# Patient Record
Sex: Female | Born: 1945 | Race: White | Hispanic: No | Smoking: Never smoker
Health system: Southern US, Community
[De-identification: ages and names within clinical notes are randomized; demographics above are authoritative.]

## PROBLEM LIST (undated history)

## (undated) DIAGNOSIS — I10 Essential (primary) hypertension: Secondary | ICD-10-CM

## (undated) DIAGNOSIS — K219 Gastro-esophageal reflux disease without esophagitis: Secondary | ICD-10-CM

## (undated) DIAGNOSIS — E785 Hyperlipidemia, unspecified: Secondary | ICD-10-CM

## (undated) DIAGNOSIS — F419 Anxiety disorder, unspecified: Secondary | ICD-10-CM

## (undated) HISTORY — DX: Essential (primary) hypertension: I10

## (undated) HISTORY — DX: Hyperlipidemia, unspecified: E78.5

## (undated) HISTORY — PX: CHOLECYSTECTOMY: SHX55

## (undated) HISTORY — DX: Gastro-esophageal reflux disease without esophagitis: K21.9

## (undated) HISTORY — DX: Anxiety disorder, unspecified: F41.9

---

## 2001-12-22 ENCOUNTER — Inpatient Hospital Stay (HOSPITAL_COMMUNITY): Admission: EM | Admit: 2001-12-22 | Discharge: 2001-12-31 | Payer: Self-pay | Admitting: Psychiatry

## 2004-11-29 ENCOUNTER — Ambulatory Visit: Payer: Self-pay | Admitting: Emergency Medicine

## 2004-11-29 ENCOUNTER — Emergency Department: Payer: Self-pay | Admitting: Unknown Physician Specialty

## 2004-11-29 IMAGING — US ABDOMEN ULTRASOUND
1 series · 17 of 25 positions shown · non-contrast
Comparison: none

REASON FOR EXAM: abd pain
COMMENTS:

[Series 1: abdomen ultrasound · 17 of 61 slices shown]
[im 1/61]
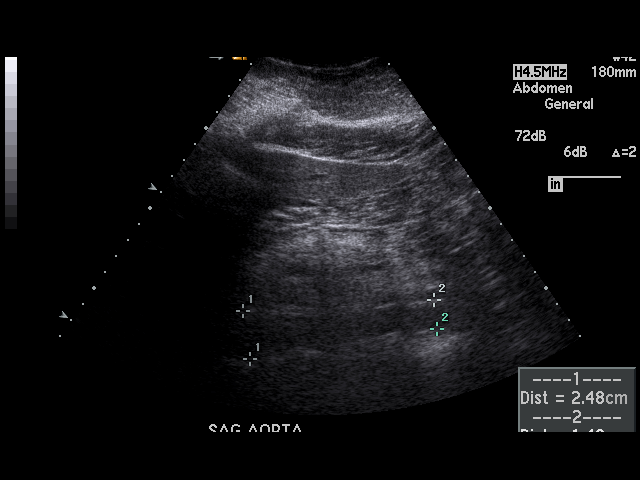
[im 6/61]
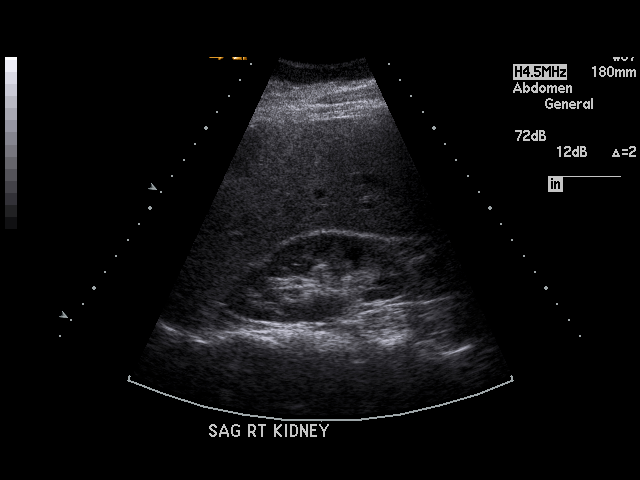
[im 8/61]
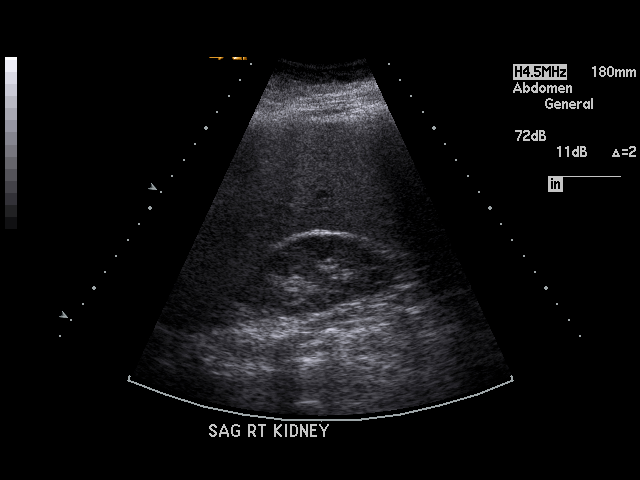
[im 13/61]
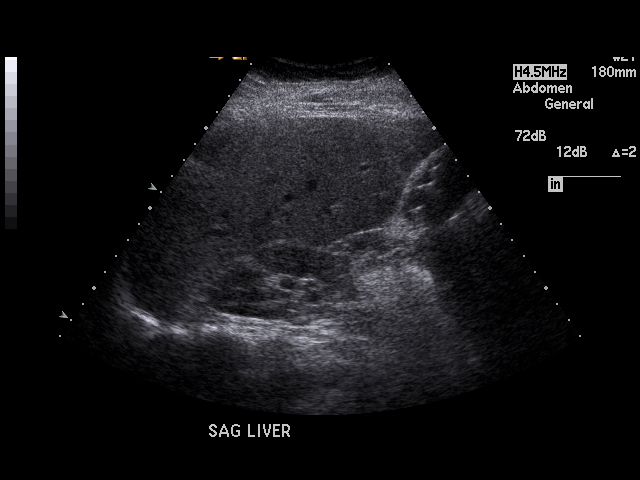
[im 16/61]
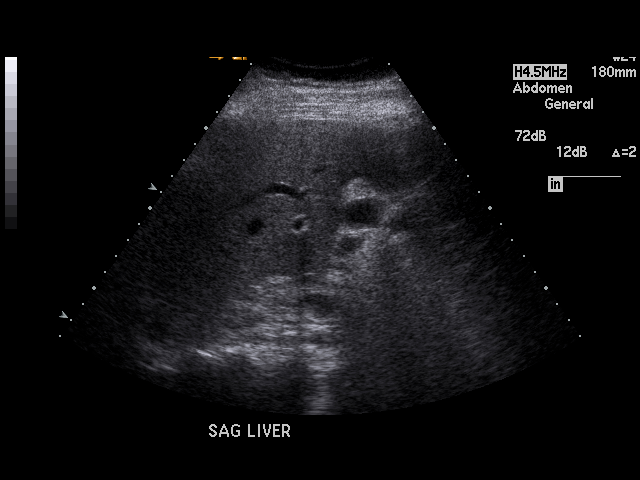
[im 21/61]
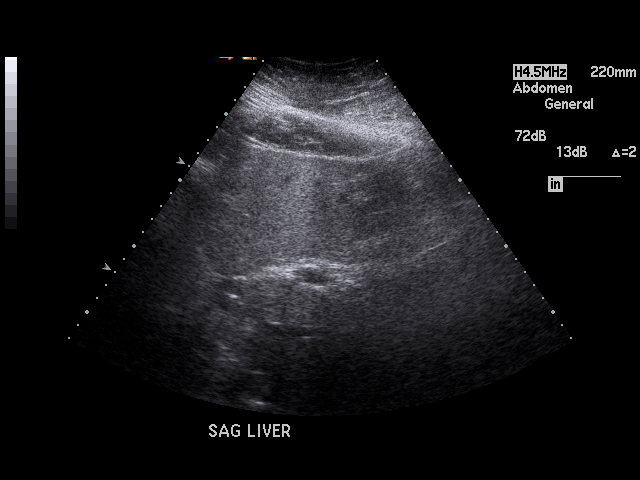
[im 23/61]
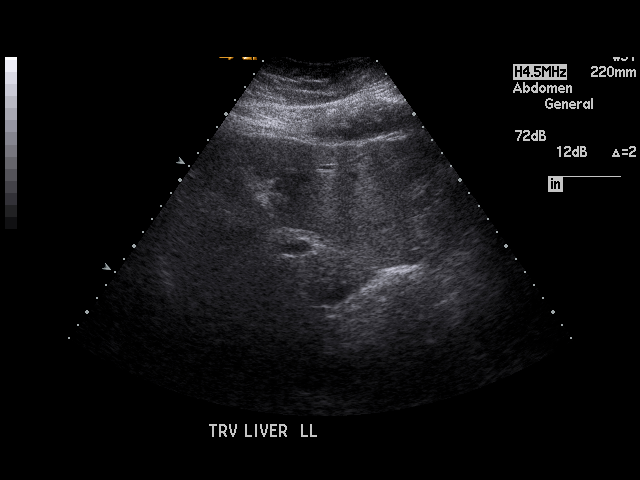
[im 28/61]
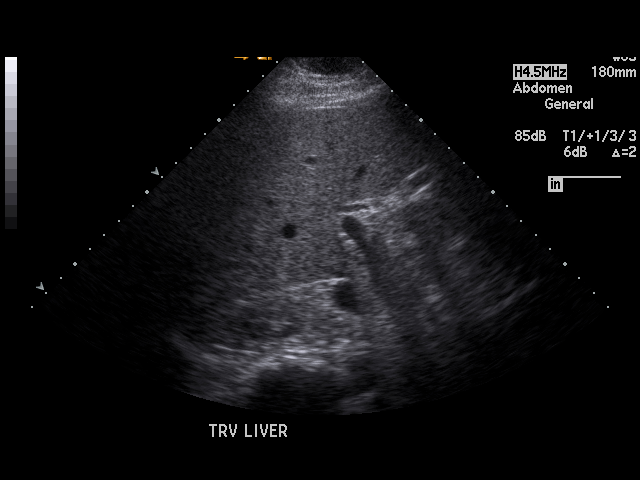
[im 31/61]
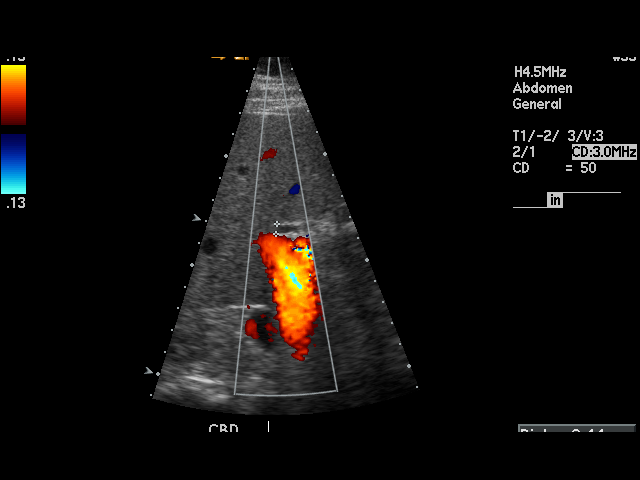
[im 33/61]
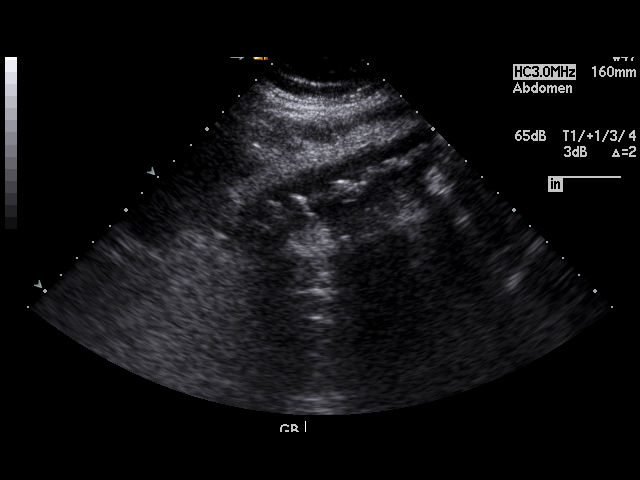
[im 38/61]
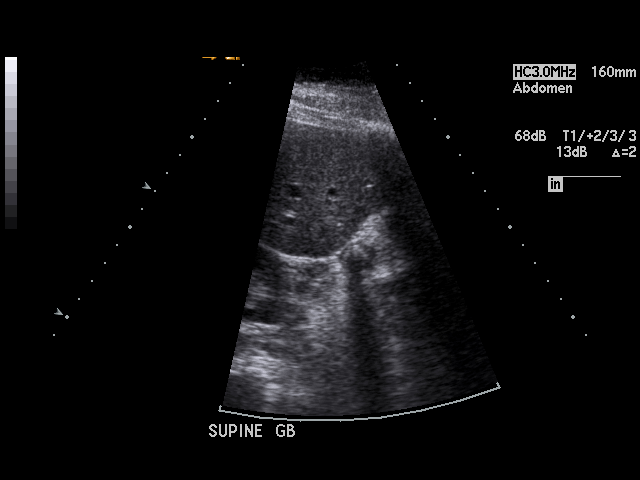
[im 41/61]
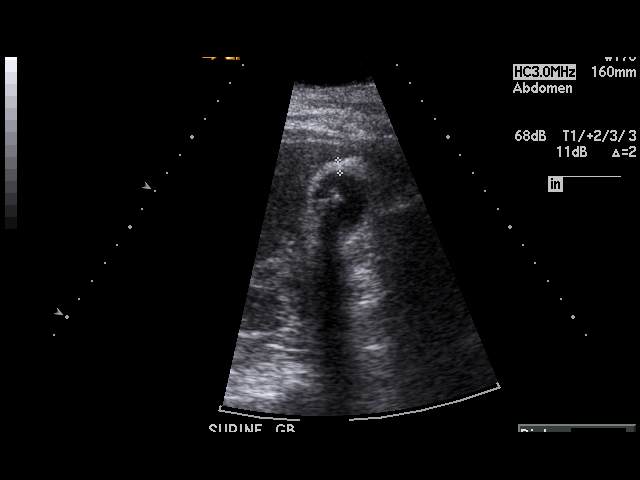
[im 46/61]
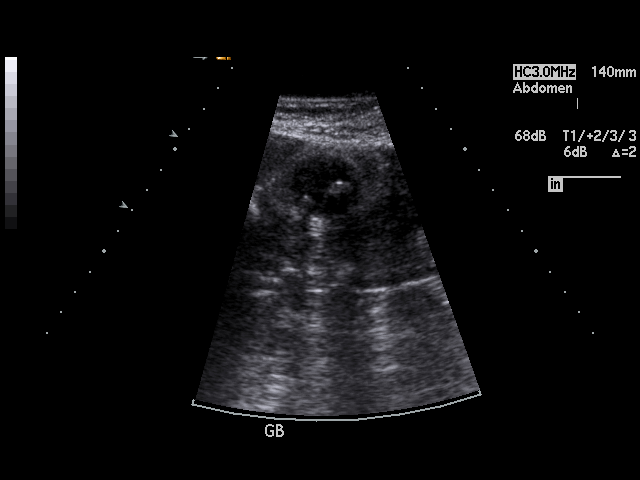
[im 48/61]
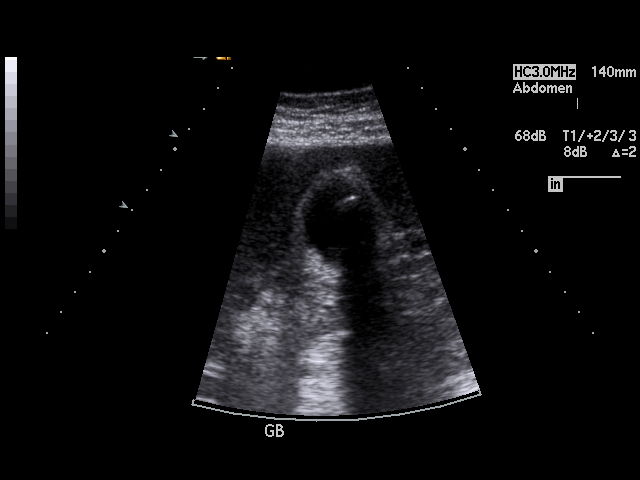
[im 53/61]
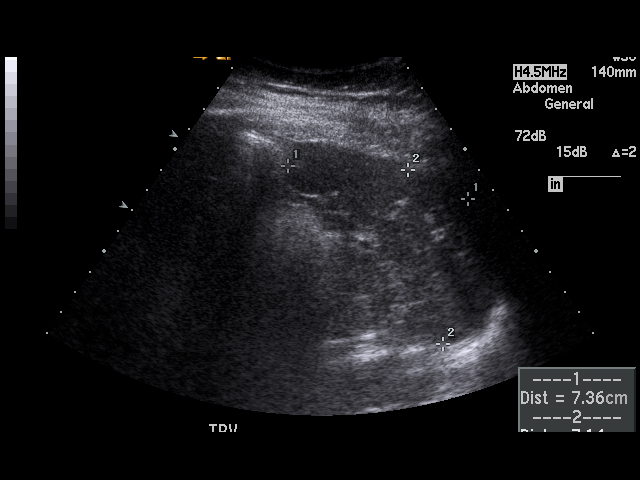
[im 56/61]
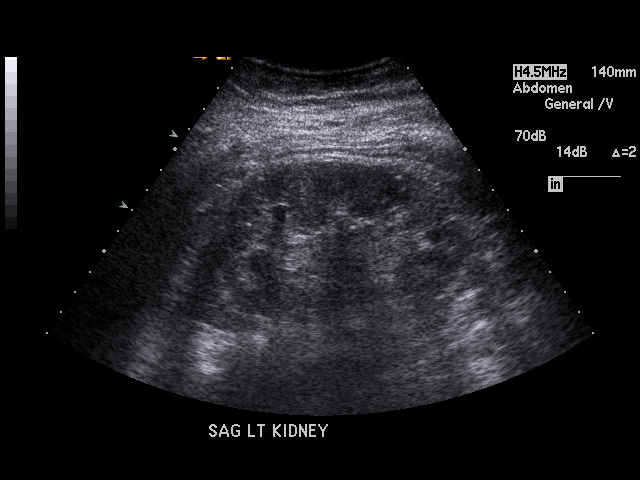
[im 61/61]
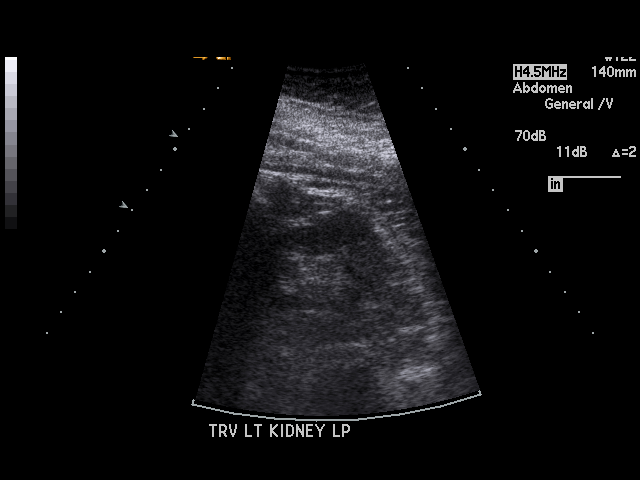

[17 of 25 positions shown; findings below may reference images not displayed]

PROCEDURE:     US  - US ABDOMEN GENERAL SURVEY  - [DATE]  [DATE]

RESULT:     Real time imaging was obtained. The gallbladder is identified
containing multiple echogenic densities with shadowing consistent with
multiple gallstones.  The common duct is within normal limits measuring
mm.  The gallbladder wall is thickened measuring 6 mm.

The liver and spleen appear within normal limits with appropriate
echotexture.

No hydronephrosis of either kidney is noted.  No abnormalities of the
abdominal aorta are noted in the region visualized.
IMPRESSION: 1)Findings compatible with multiple gallstones and a thickened gallbladder
wall.

## 2004-11-29 IMAGING — CT CT ABD-PELV W/O CM
1 of 2 series · 14 of 32 positions shown, 18 images · non-contrast
Comparison: none

REASON FOR EXAM: (1) abd pain  rm2; (2) abd pain  rm2
COMMENTS:

[Series 2: stone · axial · 0.72mm/px · z∈[-447,-42]mm · 14 of 155 slices shown, 18 images]
[im 13/155  soft-tissue]
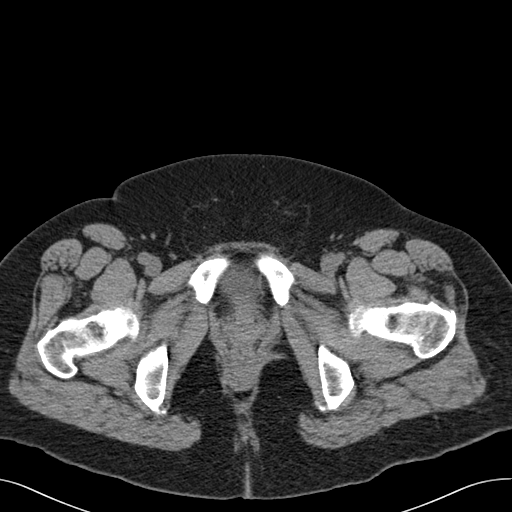
[im 13/155  bone]
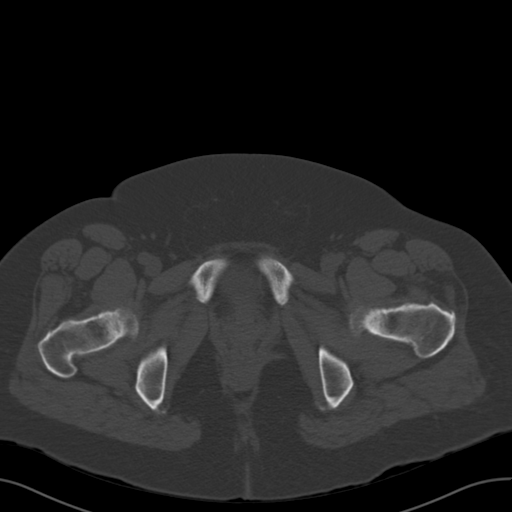
[im 25/155  soft-tissue]
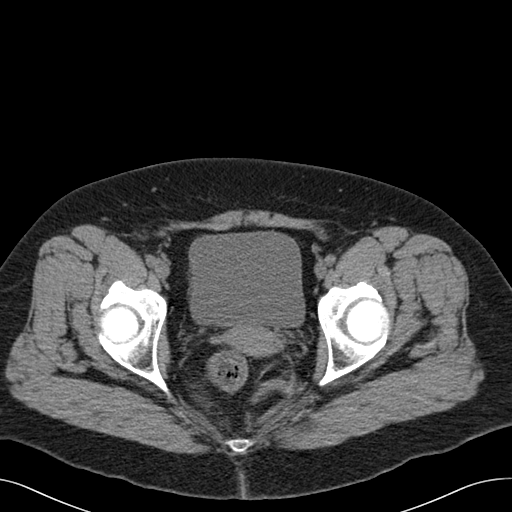
[im 37/155  soft-tissue]
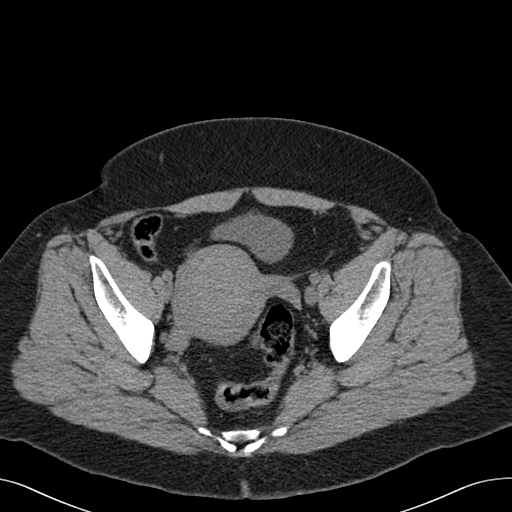
[im 50/155  soft-tissue]
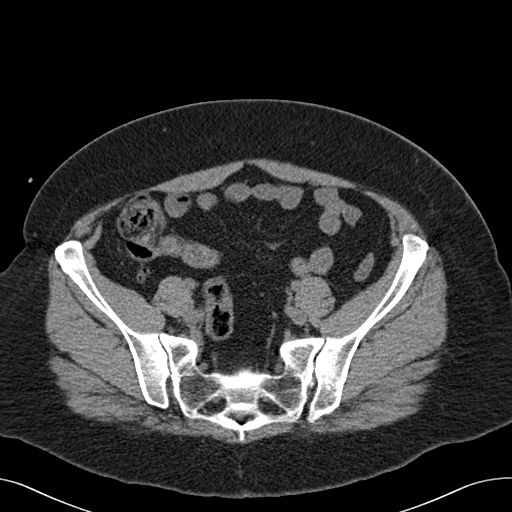
[im 62/155  soft-tissue]
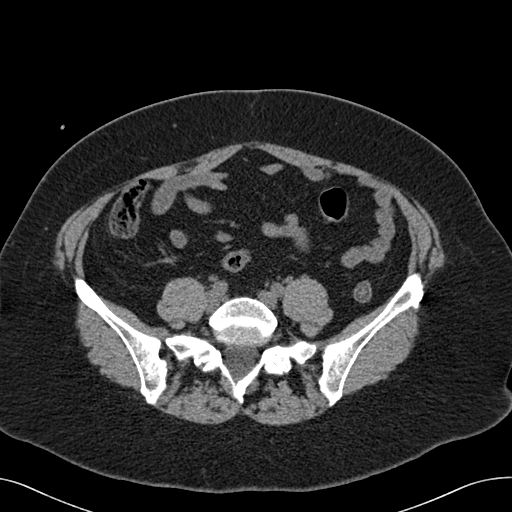
[im 74/155  soft-tissue]
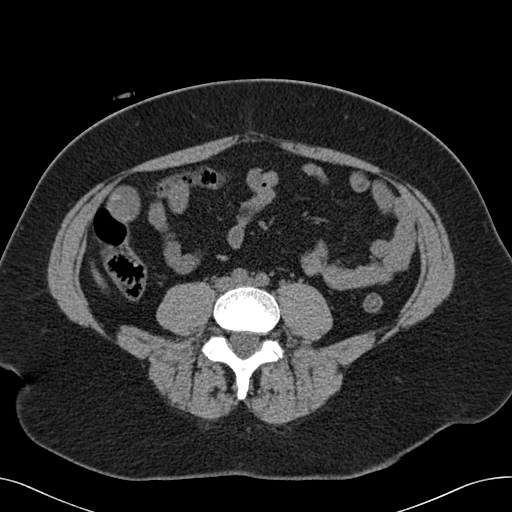
[im 87/155  soft-tissue]
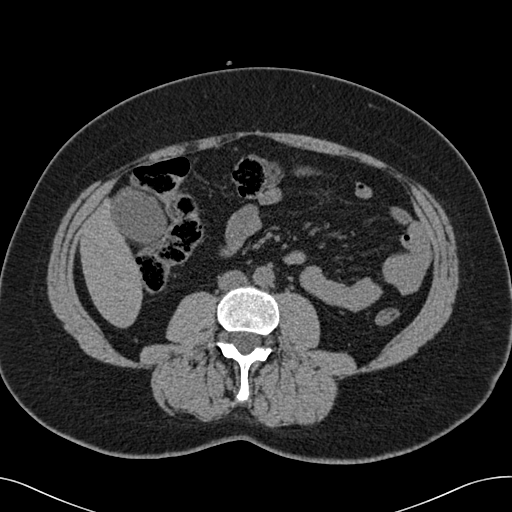
[im 99/155  soft-tissue]
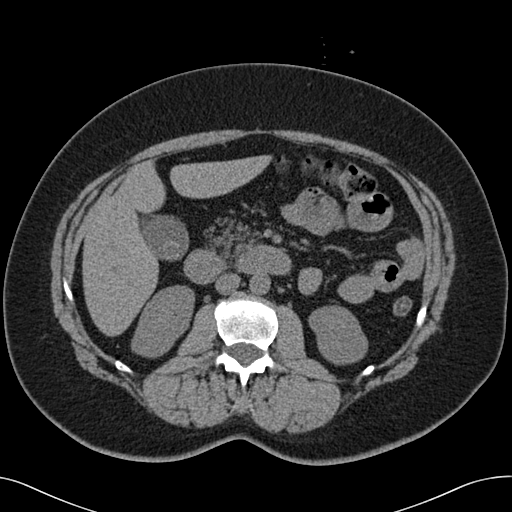
[im 111/155  soft-tissue]
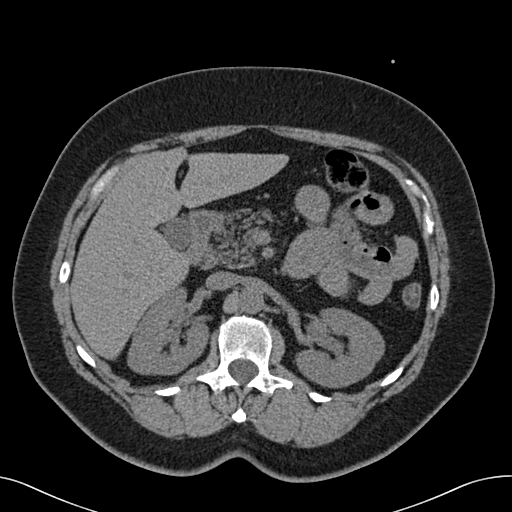
[im 111/155  bone]
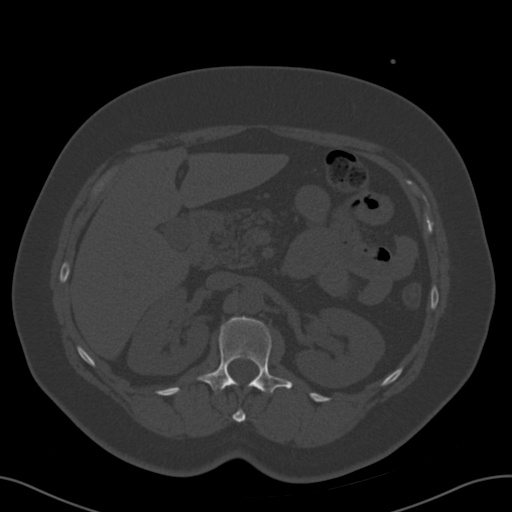
[im 124/155  soft-tissue]
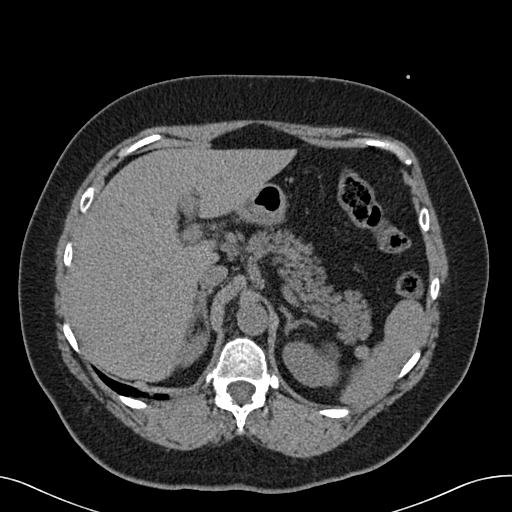
[im 130/155  lung]
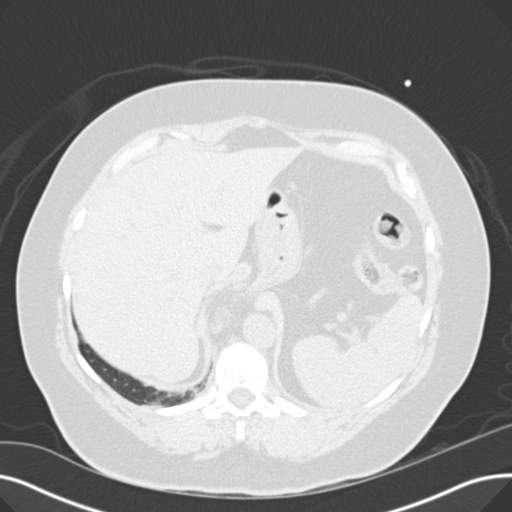
[im 136/155  soft-tissue]
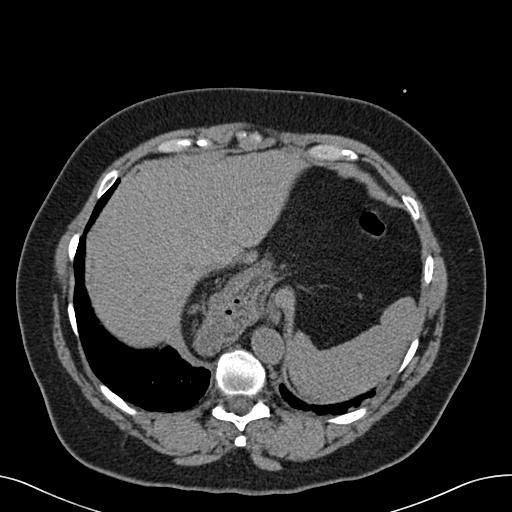
[im 136/155  lung]
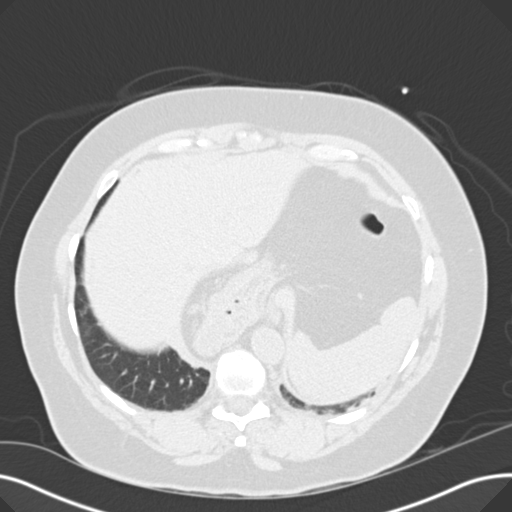
[im 142/155  lung]
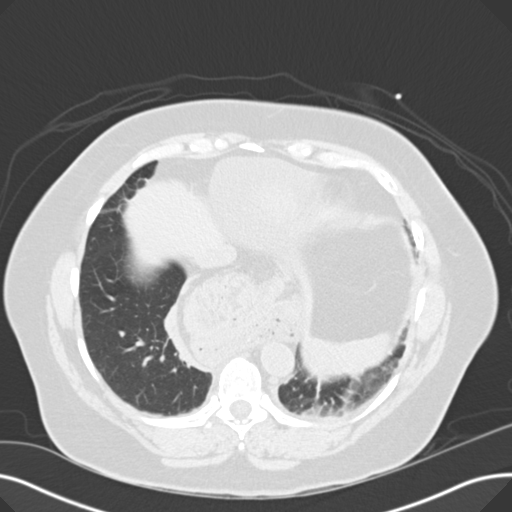
[im 148/155  soft-tissue]
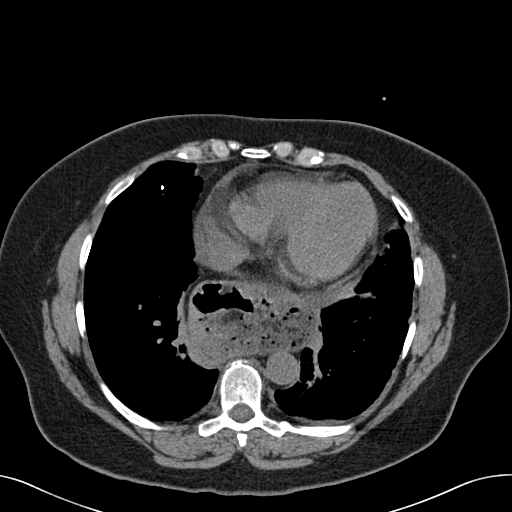
[im 148/155  lung]
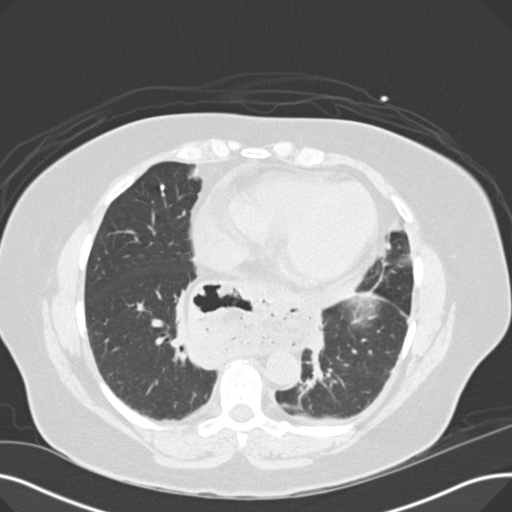

[14 of 32 positions shown; findings below may reference images not displayed]

PROCEDURE:     CT  - CT ABDOMEN AND PELVIS W[DATE]  [DATE]

RESULT:     The study was performed to evaluate the patient for urinary
tract stones. The patient is complaining of nausea and RIGHT mid and lower
abdominal discomfort.

There is noted to be a small pericardial effusion. There is a large hiatal
hernia-partially intrathoracic stomach.  The liver exhibits normal density
for this non-contrast study. There are gallstones present within the
moderately distended gallbladder.  I cannot exclude some gallbladder wall
thickening. No definite pericholecystic fluid is seen, but there is poor
definition of the pericholecystic fat immediately adjacent to the
gallbladder.  The pancreas exhibits fatty infiltration, but no evidence of a
mass or ductal dilation.  The spleen, stomach, and adrenal glands are normal
in appearance. Not mentioned above is a hypodensity measuring 1.5 cm in
diameter in the LEFT lobe of the liver with Hounsfield measurement of
approximately 5 compatible with a benign cyst.

The kidneys exhibit normal contour.  I see no parenchymal calcifications nor
evidence of hydronephrosis.  The perinephric fat is normal in appearance.
The periaortic and pericaval regions are normal in appearance. The
unopacified loops of small and large bowel are normal.

Within the pelvis the uterus and adnexal structures are grossly normal for
the non-contrast study.  The urinary bladder is partially distended and
grossly normal. The unopacified loops of small and large bowel are normal in
appearance. At lung window settings there is minimal basilar atelectasis on
the LEFT.
IMPRESSION: 1)I do not see evidence of urinary tract stones or obstruction.

2)The gallbladder is adequately distended, but contains at least one stone
and there is subtle wall thickening and perhaps a small amount of
pericholecystic fluid. This may reflect an element of acute cholecystitis.
Follow-up ultrasound may be of value. Surgical consultation is recommended.

3)There is a probable simple cyst in the LEFT lobe of the liver.

4)I see no acute abnormality elsewhere within the abdomen. There is a large
hiatal hernia-partially intrathoracic stomach and there is a small
pericardial effusion.

A preliminary report was faxed to the Emergency Department by Dr. ADAM
ADAM of the [HOSPITAL] at the conclusion of the study.

## 2004-12-05 ENCOUNTER — Other Ambulatory Visit: Payer: Self-pay

## 2004-12-07 ENCOUNTER — Ambulatory Visit: Payer: Self-pay | Admitting: Vascular Surgery

## 2005-12-06 ENCOUNTER — Ambulatory Visit: Payer: Self-pay | Admitting: Family Medicine

## 2005-12-07 ENCOUNTER — Ambulatory Visit: Payer: Self-pay | Admitting: Internal Medicine

## 2006-01-01 ENCOUNTER — Ambulatory Visit: Payer: Self-pay | Admitting: General Surgery

## 2006-01-01 HISTORY — PX: COLONOSCOPY: SHX174

## 2006-09-25 ENCOUNTER — Ambulatory Visit: Payer: Self-pay | Admitting: Family Medicine

## 2006-09-25 IMAGING — US US PELV - US TRANSVAGINAL
1 series · 17 of 25 positions shown · non-contrast
Comparison: none

REASON FOR EXAM: Pelvic pain
COMMENTS:

[Series 1: us pelv - us transvaginal · 17 of 61 slices shown]
[im 1/61]
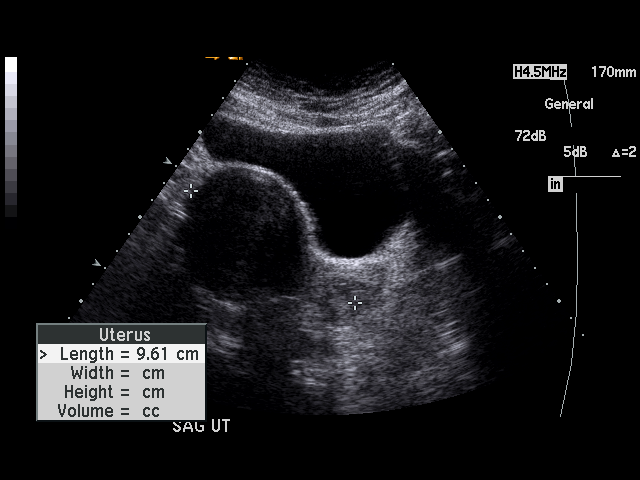
[im 6/61]
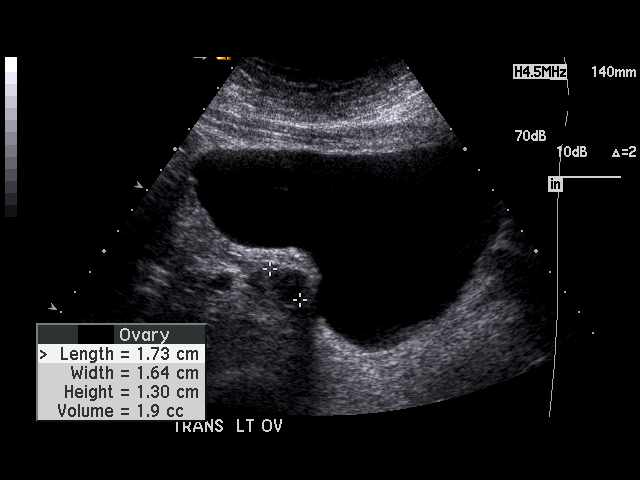
[im 8/61]
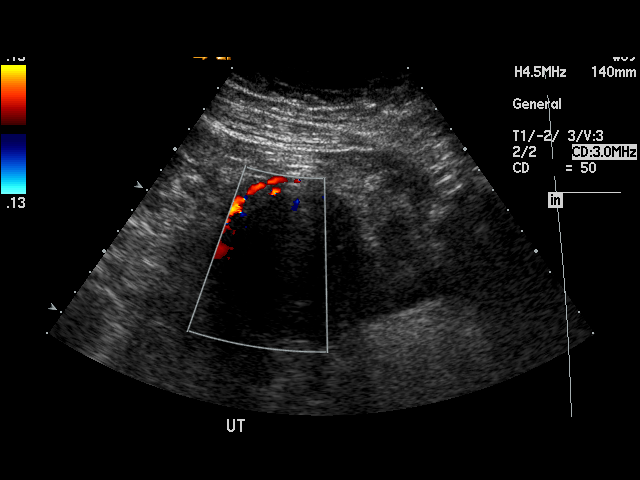
[im 13/61]
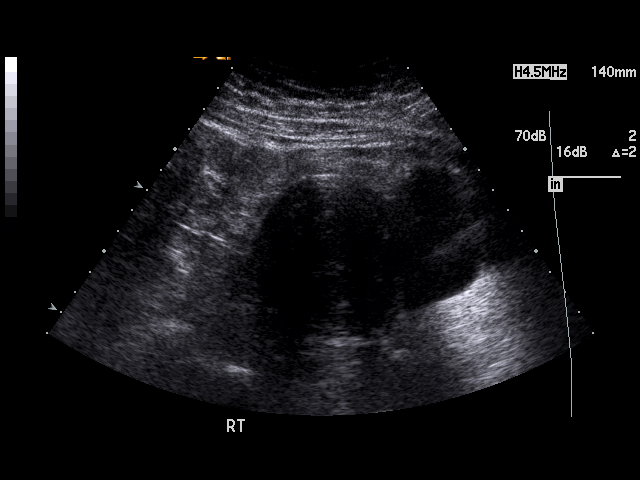
[im 16/61]
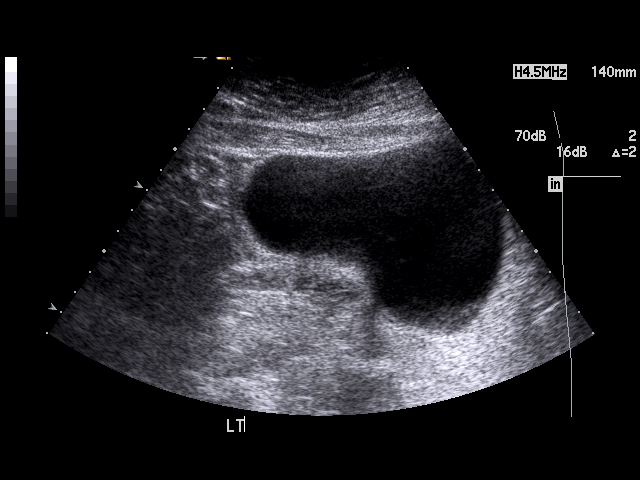
[im 21/61]
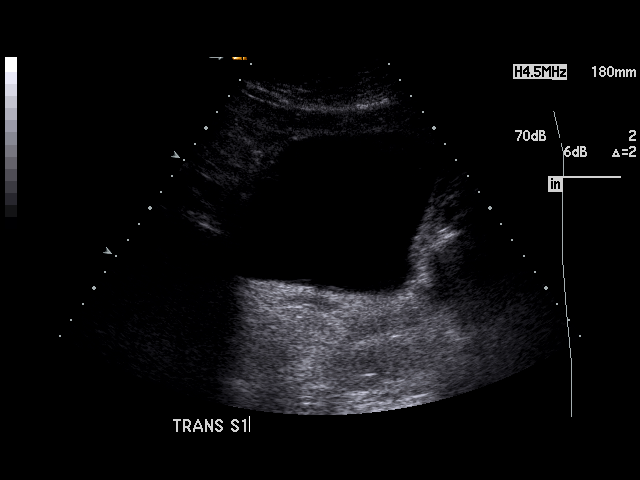
[im 23/61]
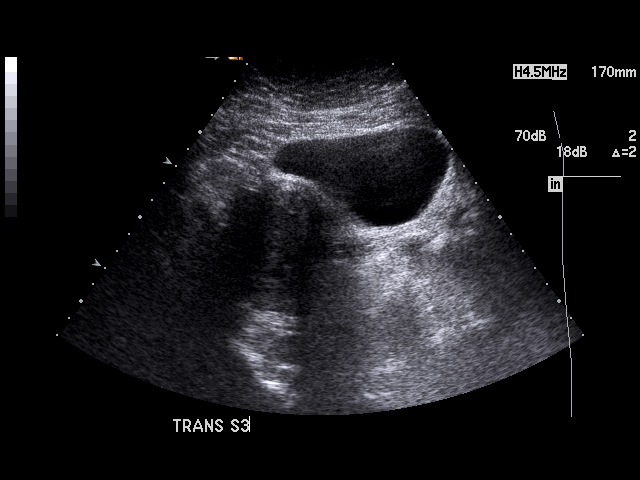
[im 28/61]
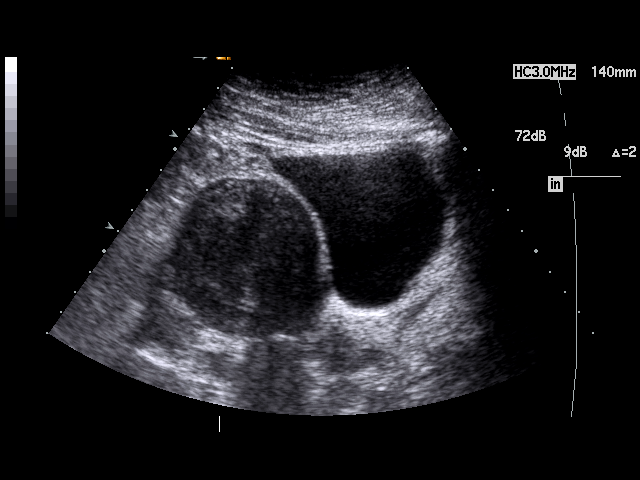
[im 31/61]
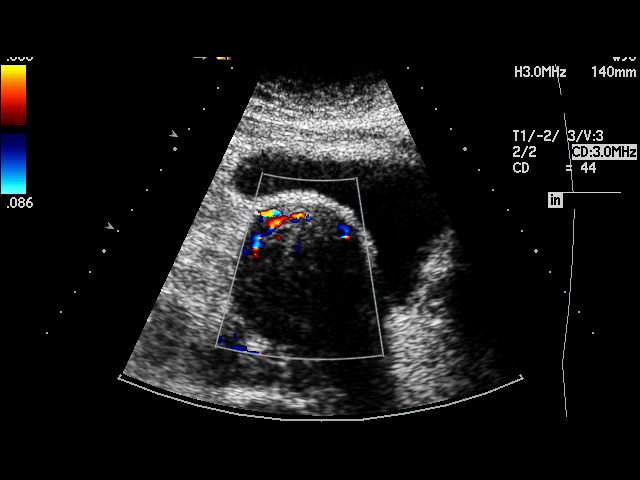
[im 33/61]
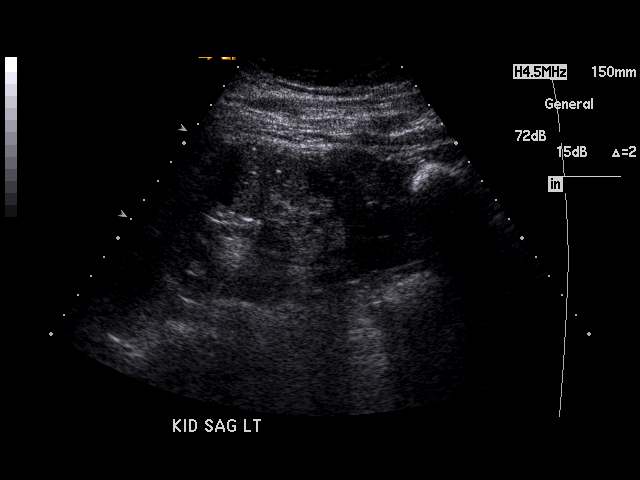
[im 38/61]
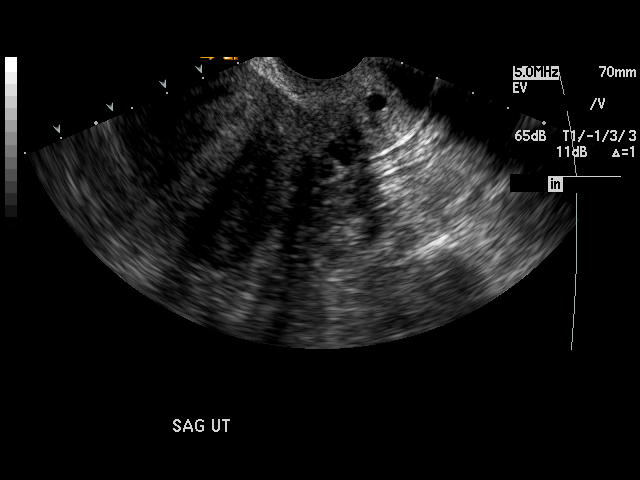
[im 41/61]
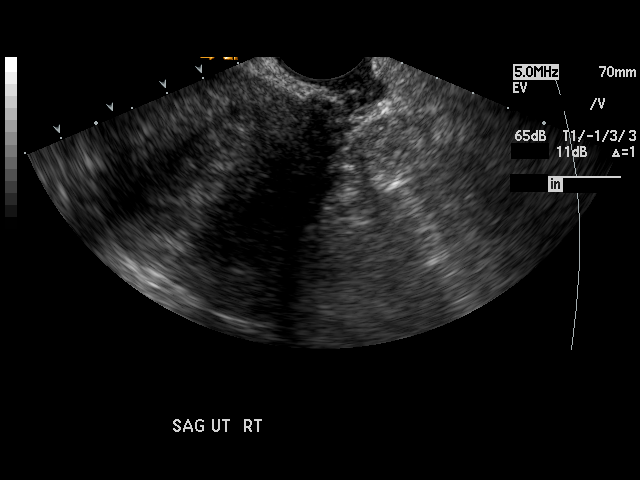
[im 46/61]
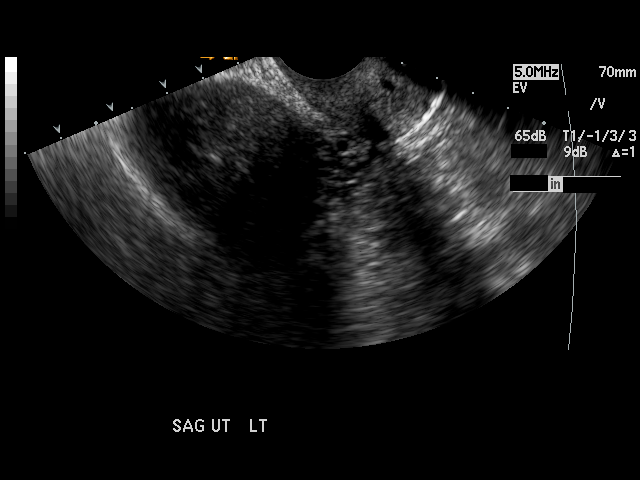
[im 48/61]
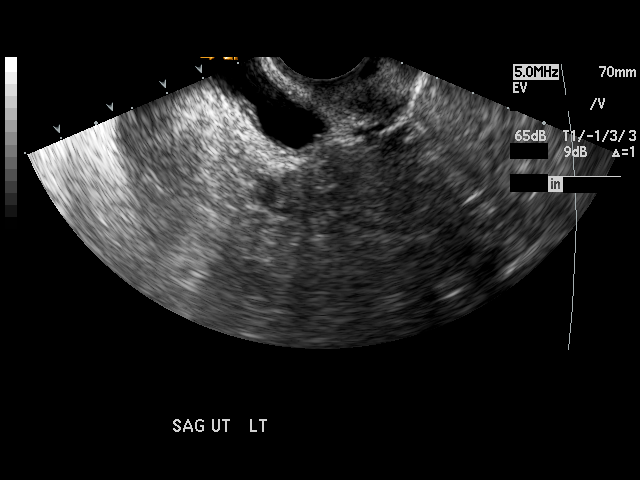
[im 53/61]
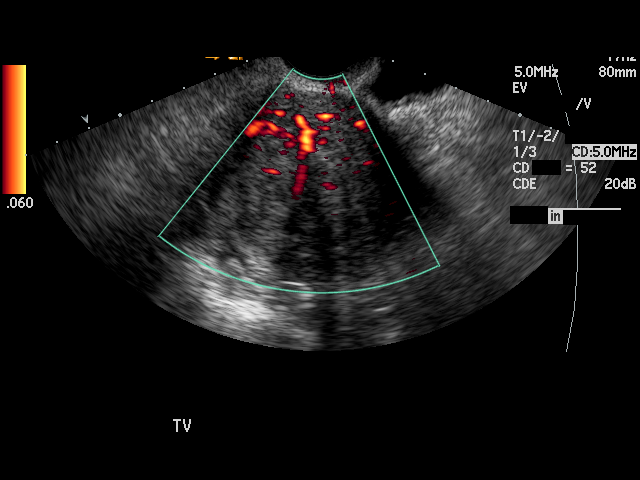
[im 56/61]
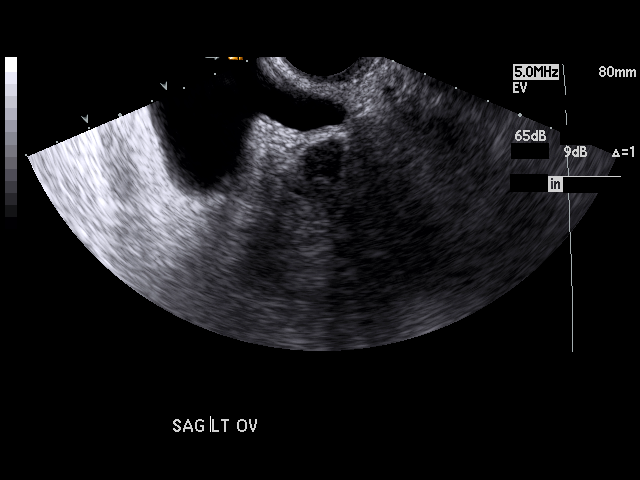
[im 61/61]
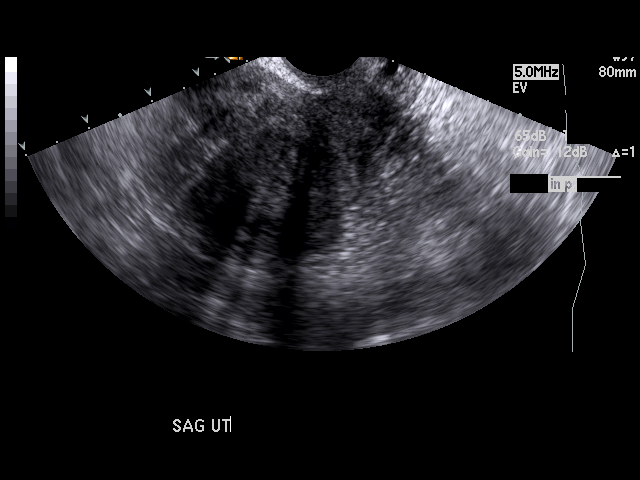

[17 of 25 positions shown; findings below may reference images not displayed]

PROCEDURE:     US  - US PELVIS MASS EXAM  - [DATE] [DATE] [DATE]  [DATE]

RESULT:     The patient is complaining of pelvic discomfort.

Comparison is made to a noncontrast CT scan of [DATE].

In the uterine fundus, there is an isoechoic, rounded mass demonstrated. It
measures approximately 3.9 cm in diameter. The endometrial stripe could not
be determined. The overall dimensions of the uterus are 9.6 x 6.0 x 6.3 cm.
No abnormal endometrial fluid collections are seen. The LEFT ovary is normal
in echotexture and measures approximately 1.7 cm in greatest dimension. The
RIGHT ovary was not demonstrated. There is no free fluid in the cul-de-sac.
Survey views of the kidneys are normal. Review of a CT scan did reveal an
area of nearly isodensity in the uterine fundus that likely reflects the
findings seen today.
IMPRESSION: 1. There are findings which likely reflect a uterine fibroid measuring
approximately 3.9 cm in diameter. This lies in the uterine fundus.
2.  I do not see objective evidence of abnormality elsewhere involving the
uterus or of the LEFT ovary. The RIGHT ovary is not demonstrated.

## 2006-12-11 ENCOUNTER — Ambulatory Visit: Payer: Self-pay | Admitting: Family Medicine

## 2009-07-08 ENCOUNTER — Ambulatory Visit: Payer: Self-pay | Admitting: Family Medicine

## 2009-08-02 ENCOUNTER — Ambulatory Visit: Payer: Self-pay | Admitting: Otolaryngology

## 2009-08-02 IMAGING — US ULTRASOUND CORE BIOPSY
1 series · 8 of 8 positions shown · non-contrast
Comparison: none

REASON FOR EXAM: rt thyroid nodule
COMMENTS:

PROCEDURE:     US  - US GUIDED BX/ASPIRATION NOT BR  - [DATE]  [DATE]
RESULT:     Thyroid Biopsy
INDICATION: Right thyroid nodule

[Series 1: ultrasound core biopsy · 8 of 8 slices shown]
[im 1/8]
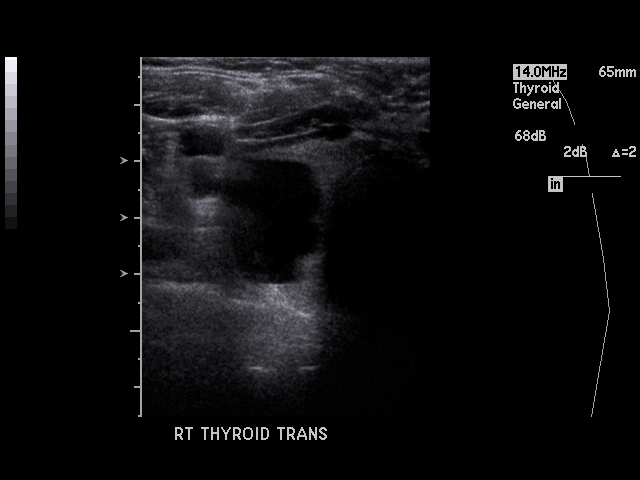
[im 2/8]
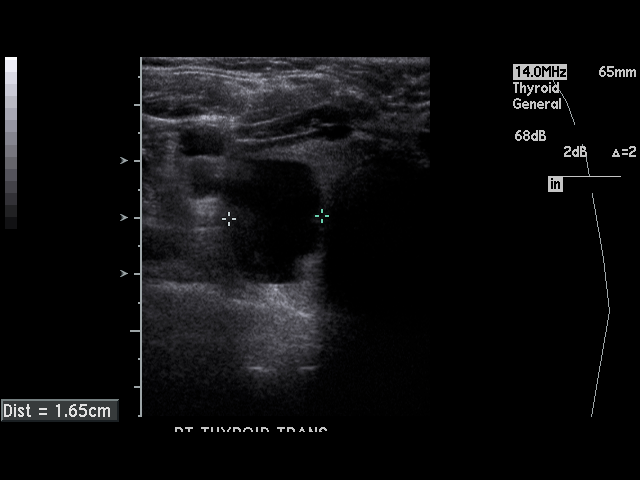
[im 3/8]
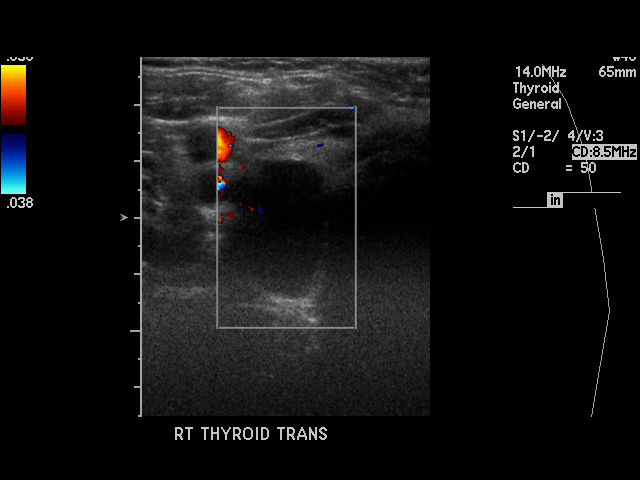
[im 4/8]
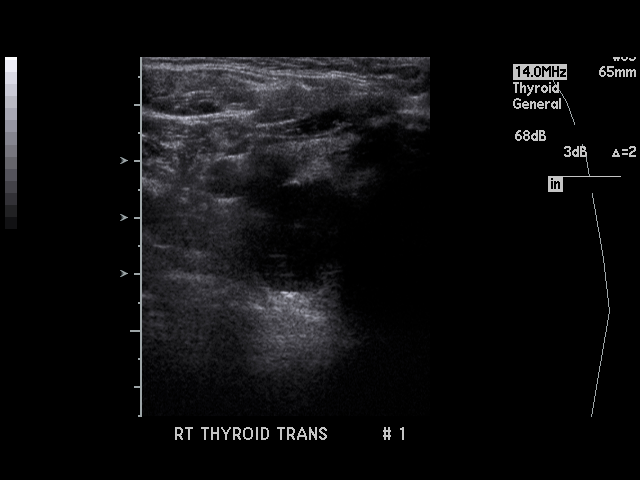
[im 5/8]
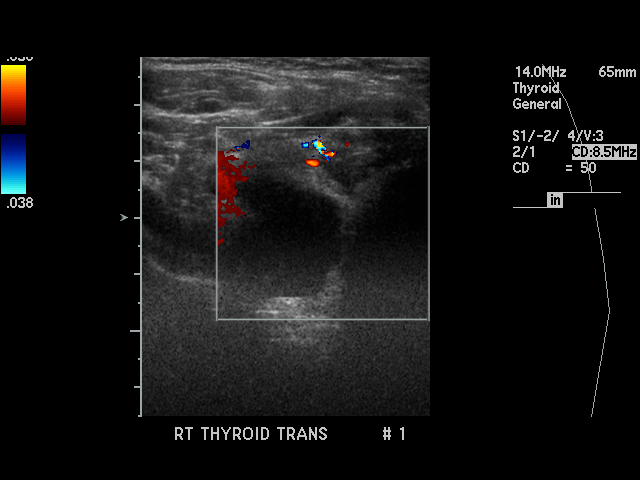
[im 6/8]
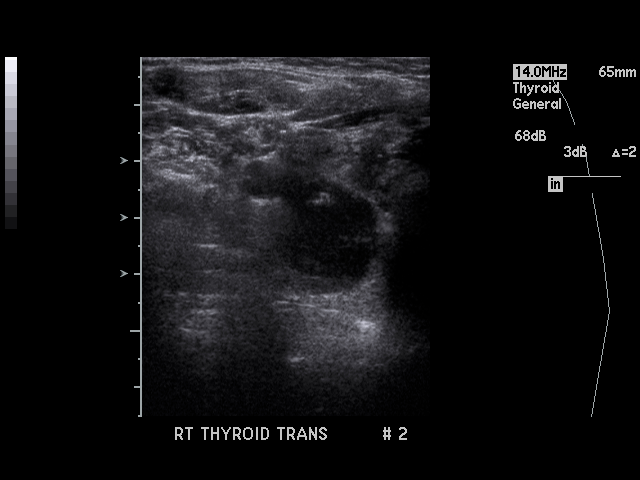
[im 7/8]
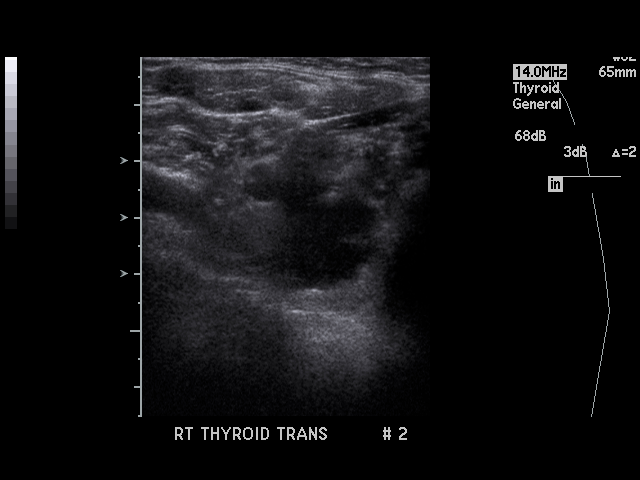
[im 8/8]
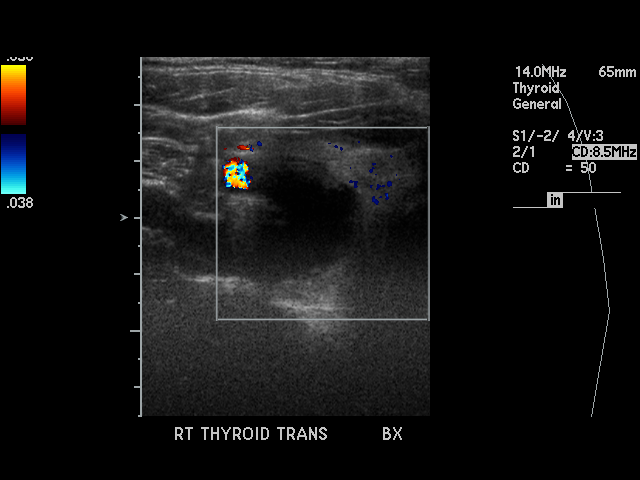

[8 of 8 positions shown; findings below may reference images not displayed]

Comparisons:  [DATE]

Procedure: Clinical assessment was performed and informed consent obtained.
Grayscale and color-flow images of the thyroid gland were obtained to
localize the nodule for biopsy. The neck was prepped and draped in the usual
sterile manner and the overlying skin was anesthetized with 1% lidocaine.

Under ultrasound guidance a 25 gauge heparinized needle was inserted into
the hypoechoic nodule. A 3 ml syringe was attached to the needle hub and a
fine needle aspirate sample obtained. One more samples were obtained using
similar technique. Cytopathology was present during the procedure and
verified the samples. The FNA yielded a rust colored viscous material. The
needle was removed, the neck cleaned, and a sterile bandage applied.

The procedure was well tolerated and without complication.  The patient was
transferred to the recovery unit in stable condition.
IMPRESSION: Ultrasound-guided fine needle aspirate of a right lobe cystic thyroid nodule.

## 2009-08-22 ENCOUNTER — Ambulatory Visit: Payer: Self-pay | Admitting: Family Medicine

## 2010-08-07 ENCOUNTER — Ambulatory Visit: Payer: Self-pay | Admitting: Otolaryngology

## 2010-08-07 IMAGING — US US THYROID
1 series · 17 of 25 positions shown · non-contrast
Comparison: none

REASON FOR EXAM: right thyroid cyst follow up
COMMENTS:

[Series 1: us thyroid · 17 of 36 slices shown]
[im 1/36]
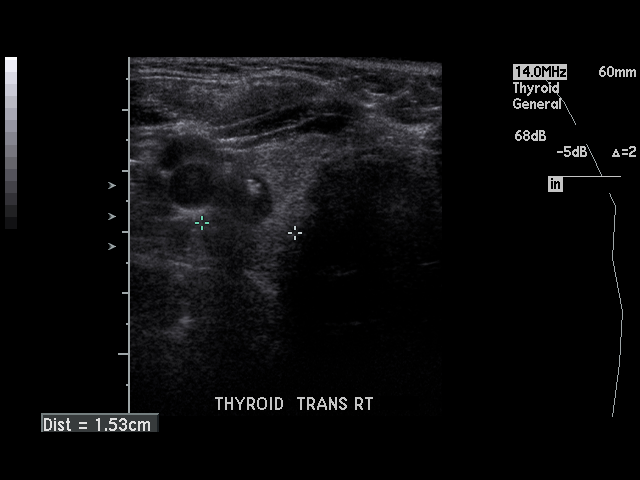
[im 3/36]
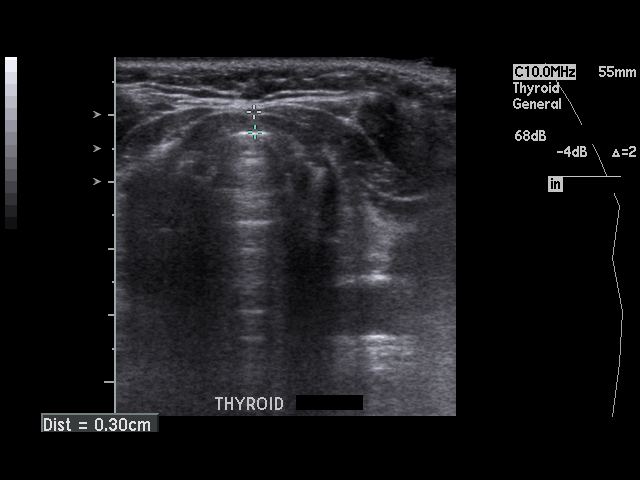
[im 5/36]
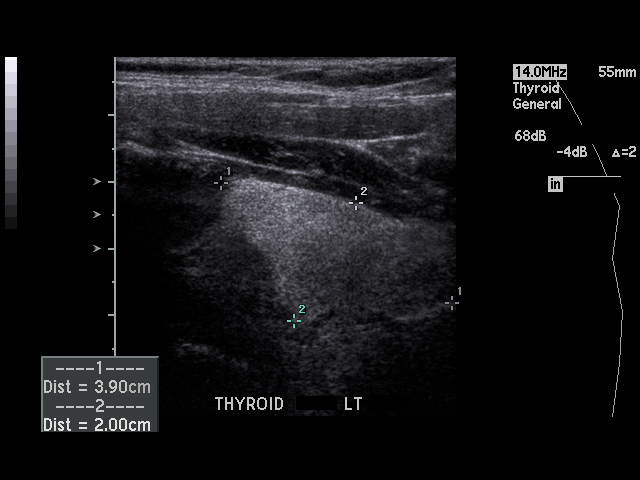
[im 8/36]
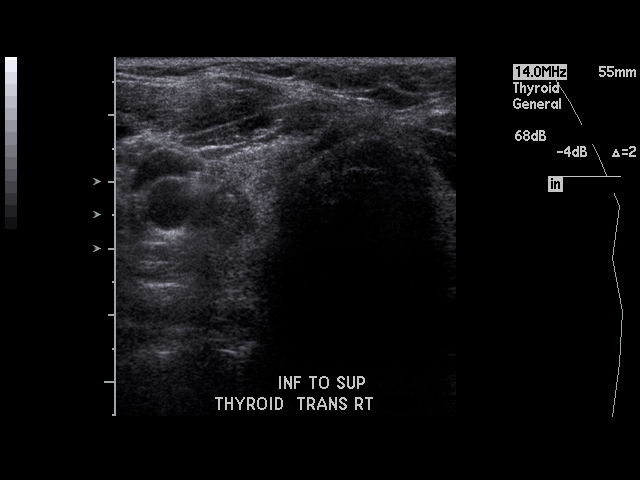
[im 9/36]
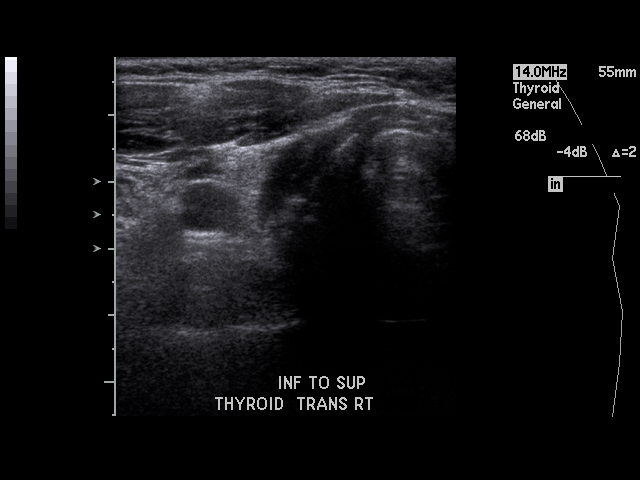
[im 12/36]
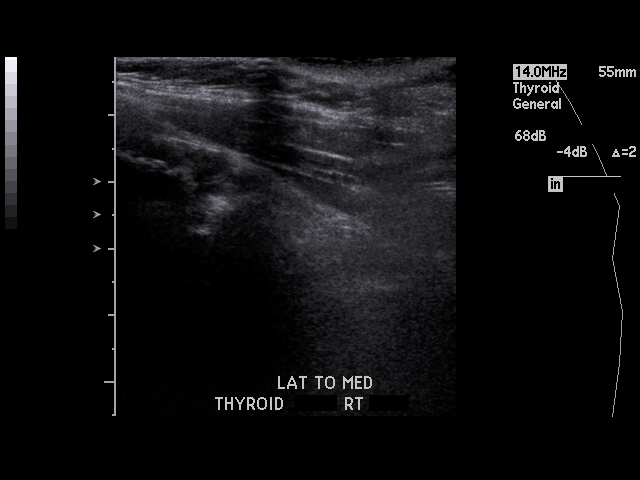
[im 14/36]
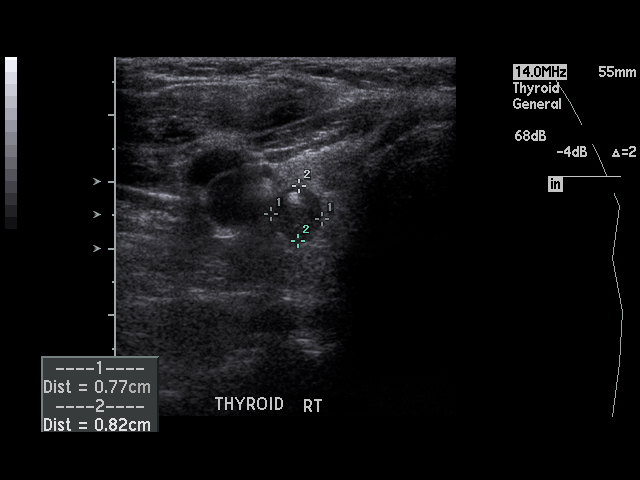
[im 17/36]
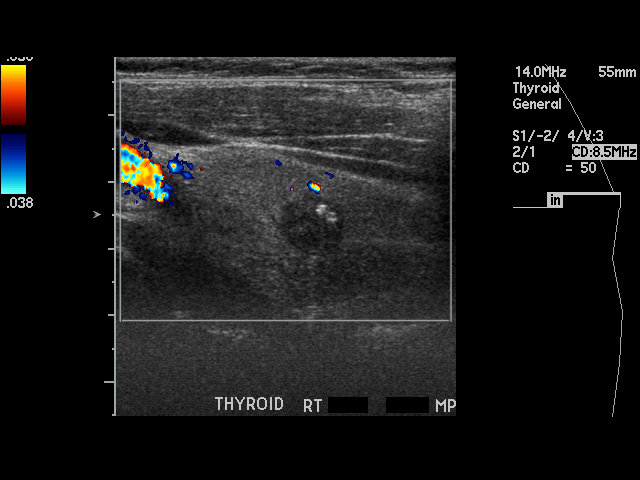
[im 18/36]
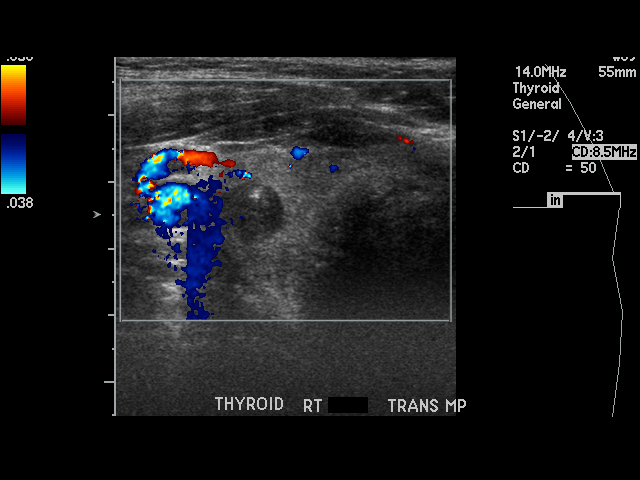
[im 19/36]
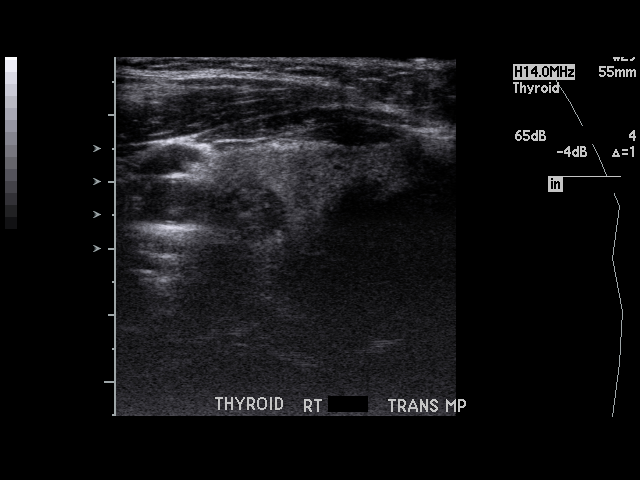
[im 22/36]
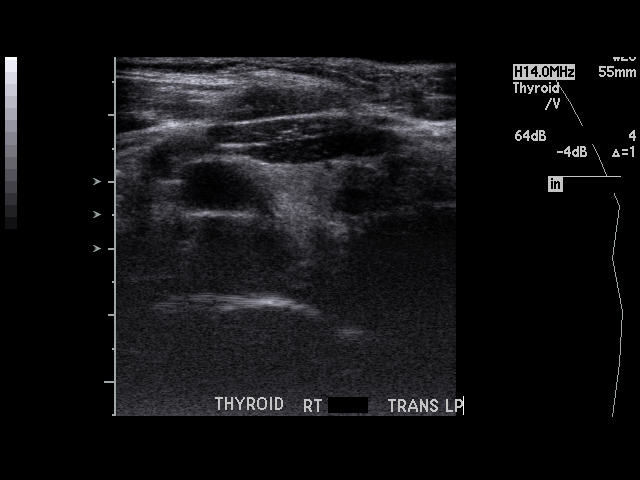
[im 24/36]
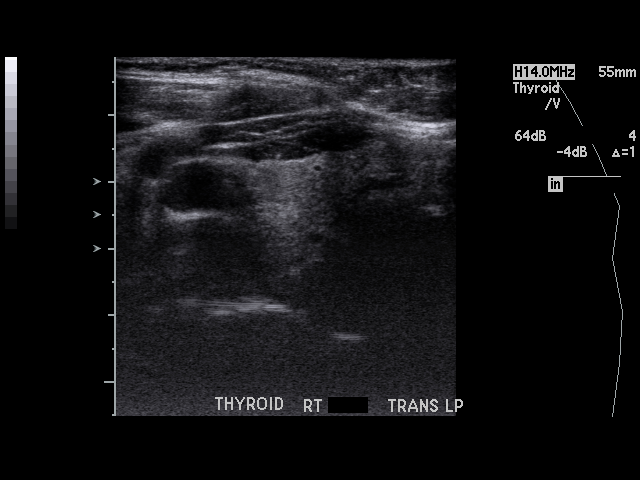
[im 27/36]
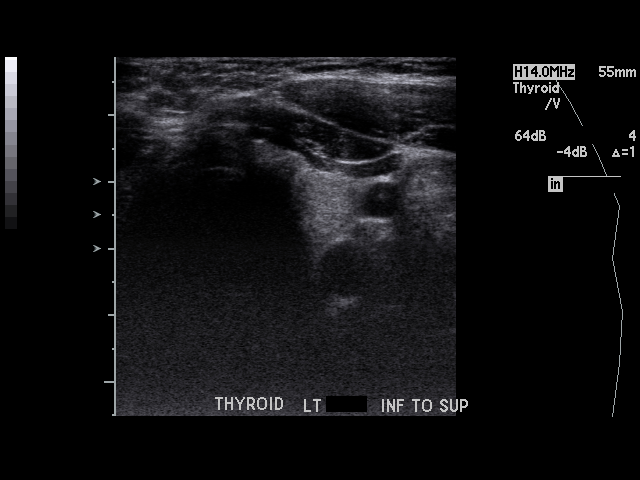
[im 28/36]
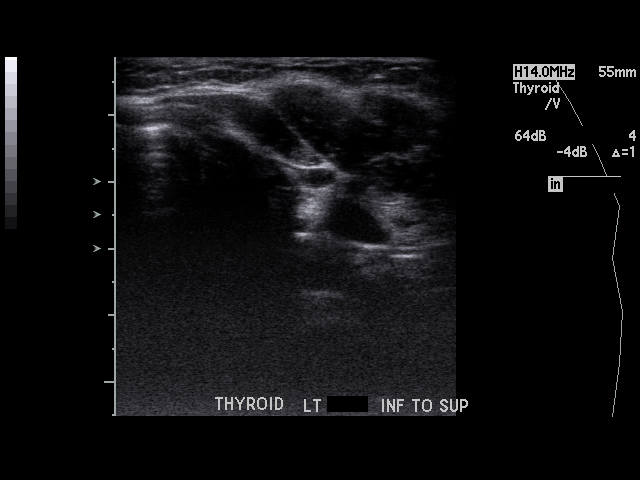
[im 31/36]
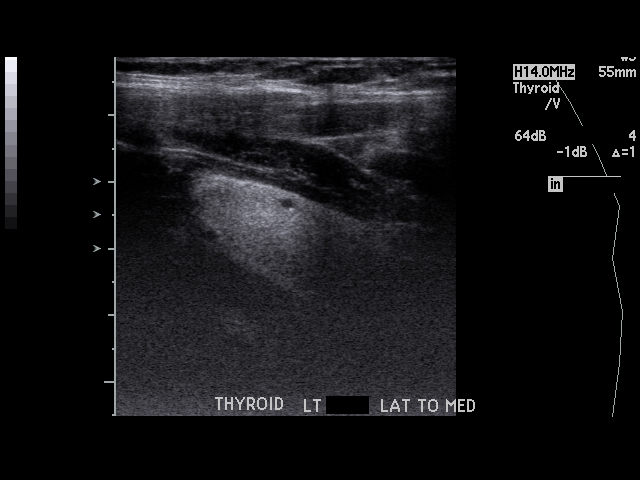
[im 33/36]
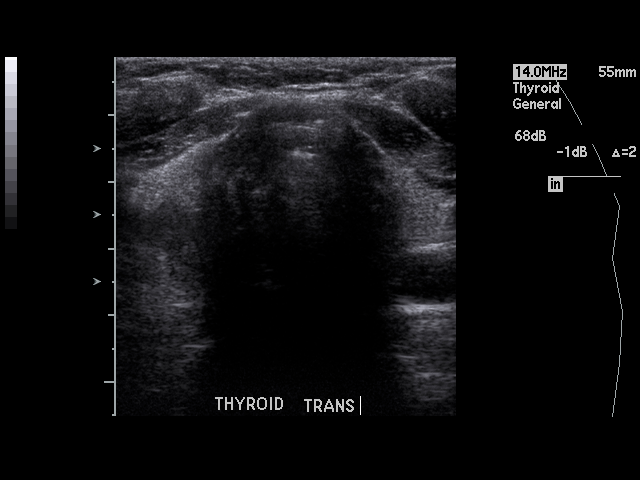
[im 36/36]
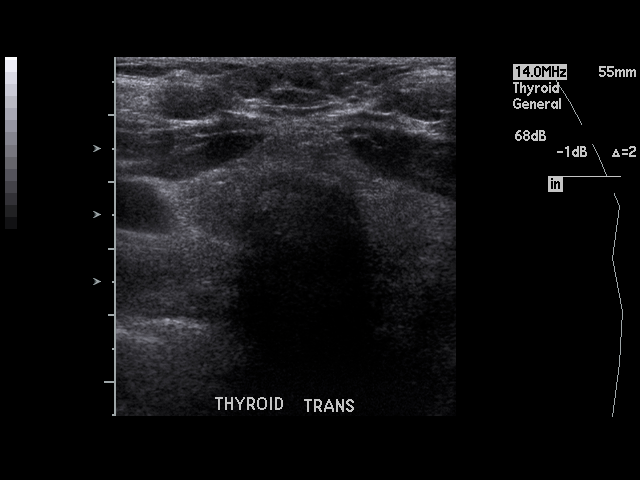

[17 of 25 positions shown; findings below may reference images not displayed]

PROCEDURE:     US  - US THYROID  - [DATE]  [DATE]

RESULT:     The right lobe of the thyroid measures 4.8 cm x 2.6 cm x 1.5 cm
and the left lobe measures 3.9 cm x 2.0 cm x 1.39 cm. There is a complex
mass in the midpole region of the right lobe. The mass measures 8.7 mm at
maximum diameter and has decreased substantially in size since the prior
exam of [DATE], at which time this mass measured 2.8 cm at
maximum diameter. There are a few bright echoes within the mass that may
represent early calcification but no shadowing is currently seen.
Reportedly, this mass has been aspirated or biopsied in the interval since
the previous exam.

The 8.8 mm hypoechoic mass at the lower pole of the right lobe noted on the
prior exam is not identified on the current study. No nodules are identified
on the left. The thyroid echotexture on the left is homogeneous.
IMPRESSION: 1. There is an 8.7 mm complex hypoechoic nodule in the midpole region of the
right lobe containing a few bright echoes that may represent early
calcification. This nodule has decreased in size in comparison with the
prior exam of [DATE] [DATE], [DATE].
[DATE]. The tiny nodule at the lower pole of the right lobe previously observed
is not seen on this exam.
3. No new masses are identified.
4. The thyroid echotexture is homogeneous on the left and is homogeneous
peripheral to the nodule on the right.

## 2011-10-30 ENCOUNTER — Ambulatory Visit: Payer: Self-pay | Admitting: General Surgery

## 2011-10-30 IMAGING — US US THYROID
1 series · 13 of 25 positions shown · non-contrast
Comparison: none

REASON FOR EXAM: rt thyroid nodule
COMMENTS:

[Series 1: us thyroid · 0.08mm/px · 13 of 44 slices shown]
[im 1/44]
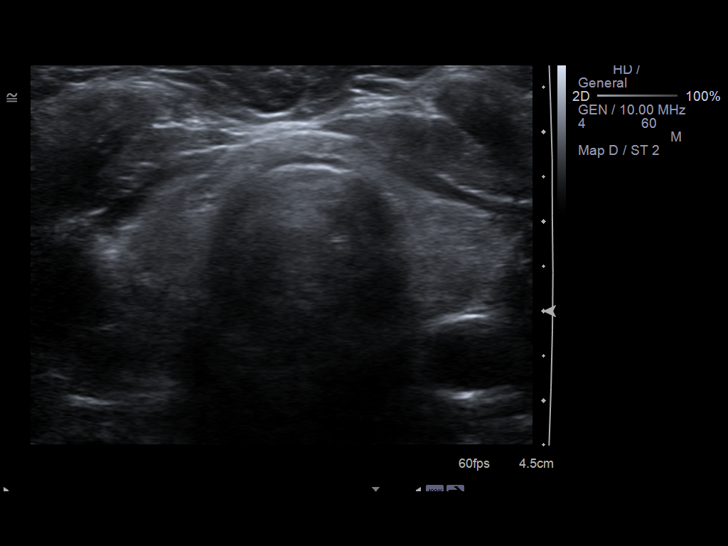
[im 4/44]
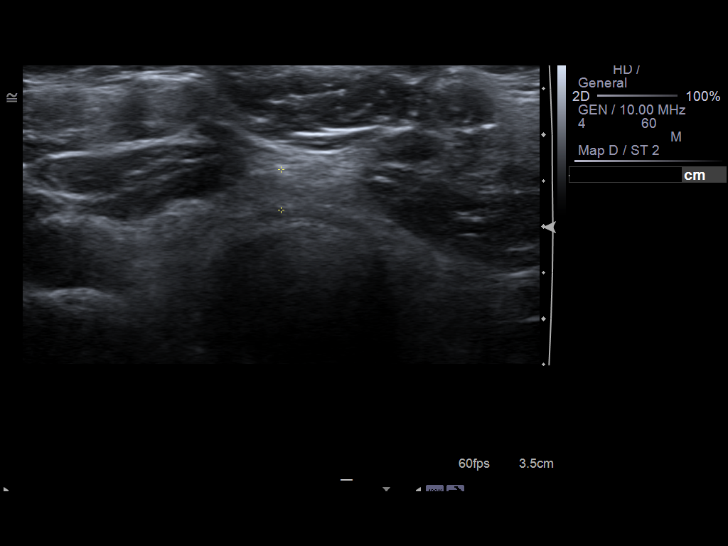
[im 8/44]
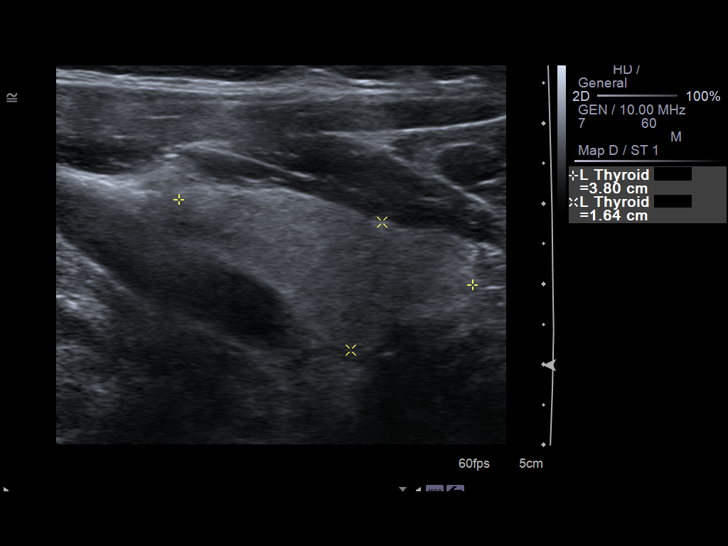
[im 11/44]
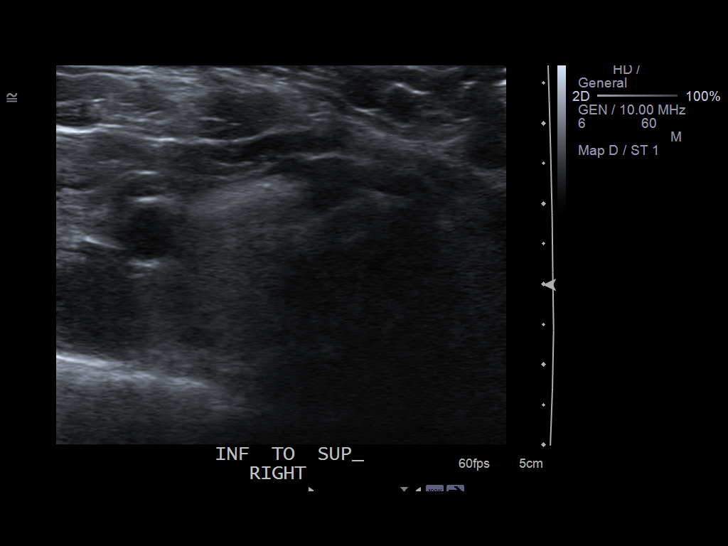
[im 15/44]
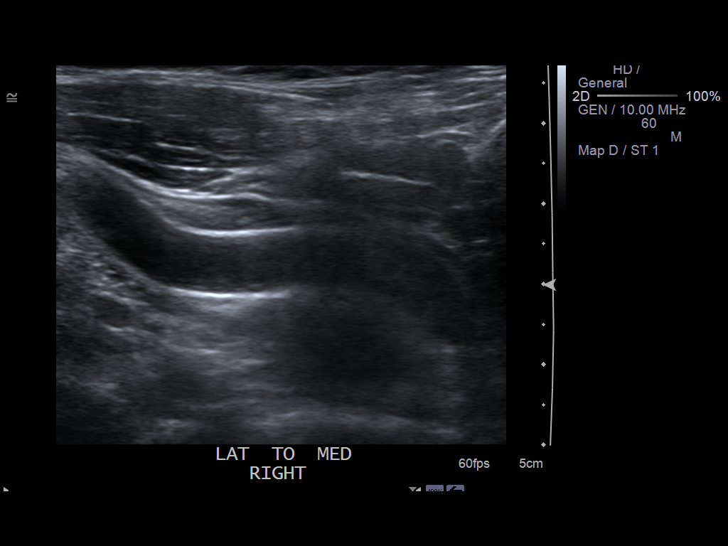
[im 18/44]
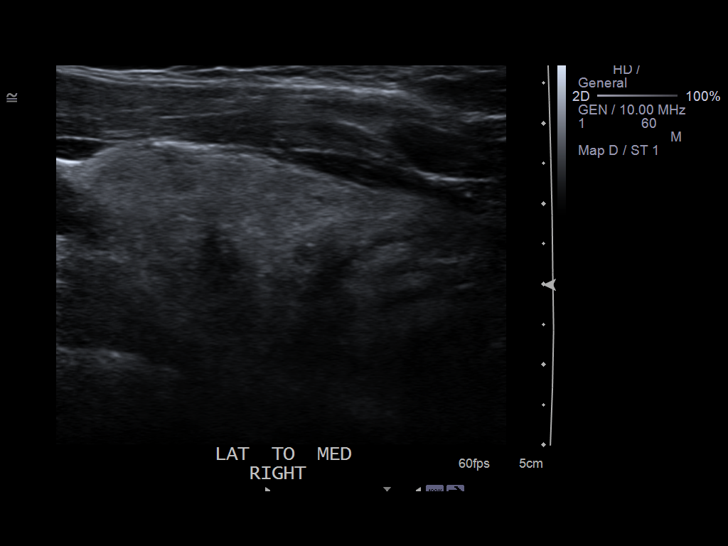
[im 22/44]
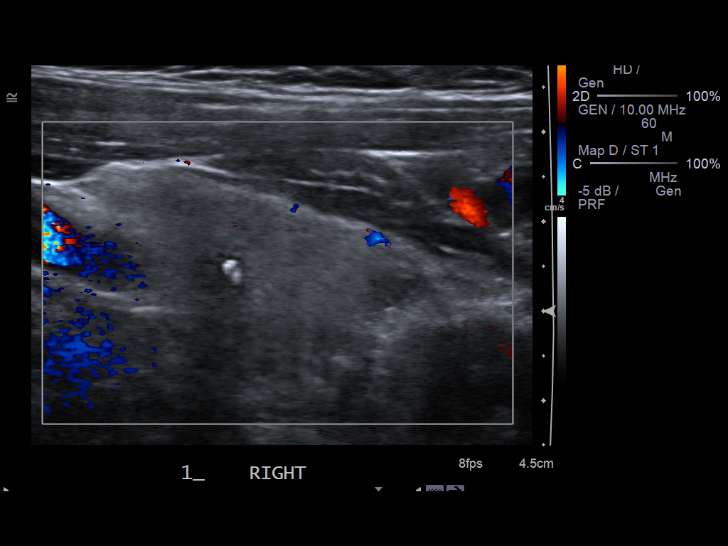
[im 26/44]
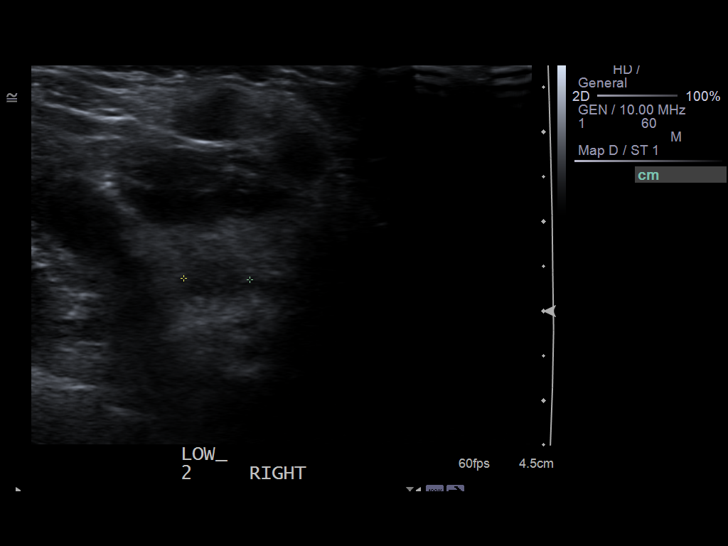
[im 29/44]
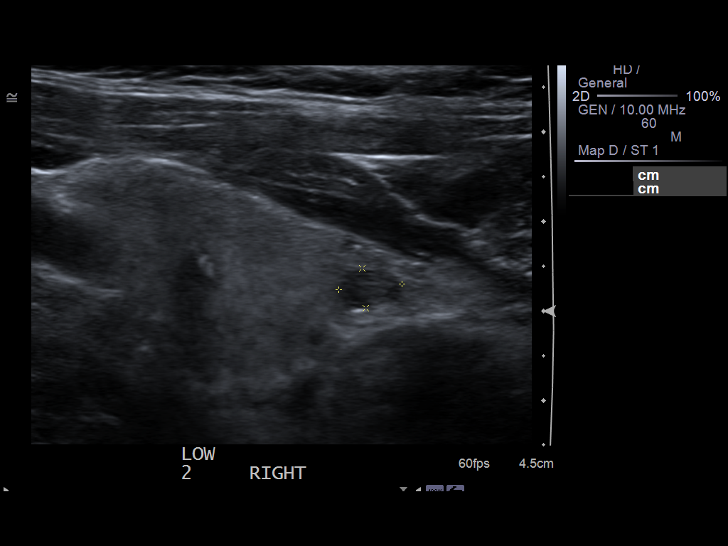
[im 33/44]
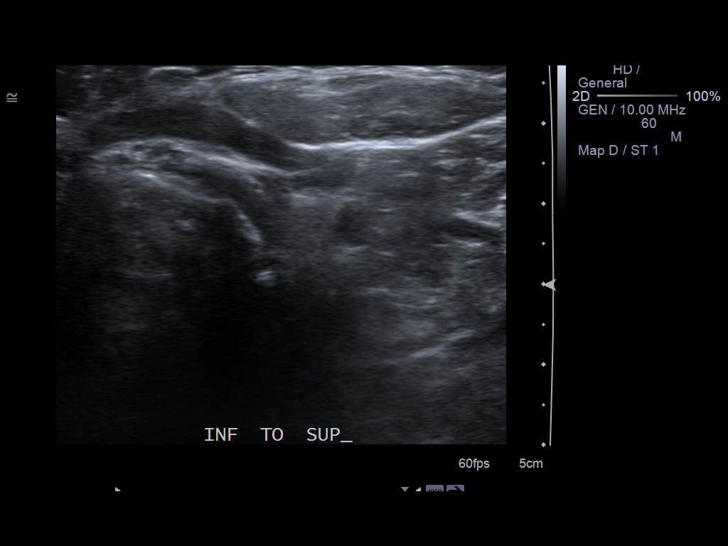
[im 36/44]
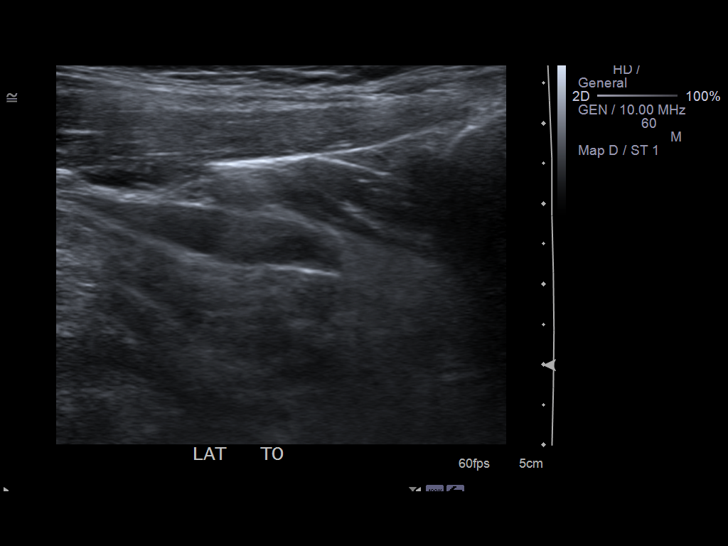
[im 40/44]
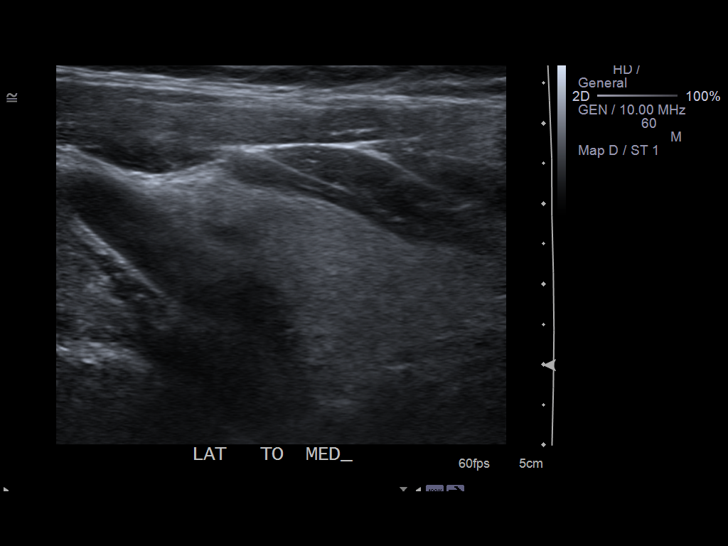
[im 44/44]
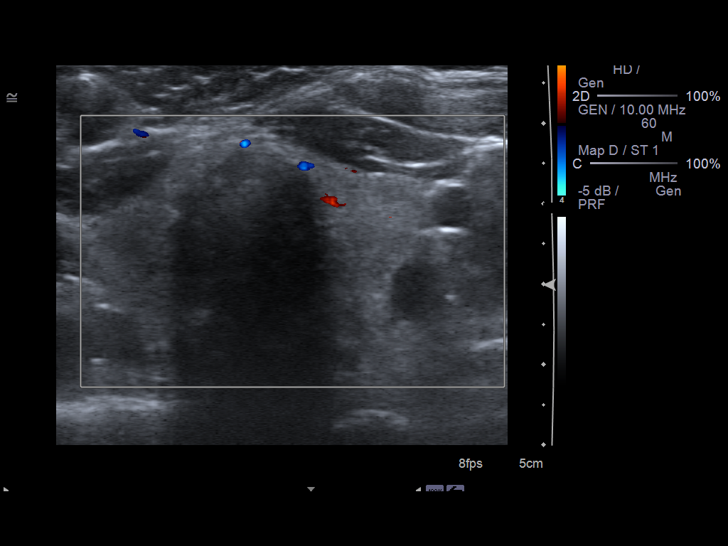

[13 of 25 positions shown; findings below may reference images not displayed]

PROCEDURE:     US  - US THYROID  - [DATE]  [DATE]

RESULT:     The right lobe of the thyroid measures 4.48 cm x 2.29 cm x
cm and the left lobe measures 3.8 cm x 1.64 cm x 1.42 cm. There is a 6.9 mm
hypoechoic complex nodule at the midpole region of the right lobe. The
nodule previously measured 8.7 mm at maximum diameter. The nodule may
actually be smaller than on the prior exam or this less than 2 mm variation
may be due to the variation in measurement technique. Dense calcifications
are again seen within the nodule.

A second hypoechoic nodule is observed in the right lobe and is located at
the lower pole. This nodule measures 7.4 mm at maximum diameter. This nodule
is not seen on the prior exam of [DATE] but is appreciated on the exam of
[DATE]. The nodule is smoothly marginated and hypoechoic. Maximum
diameter on this exam is 7.1 mm as compared to 8.8 mm on the exam of [1T].
No new thyroid masses or nodules are seen on the right.

No nodules are seen on the left. The thyroid echotexture of both lobes is
homogeneous.
IMPRESSION: 1.  There are two, stable or slightly smaller thyroid nodules on the right
as described above.
2.  Stable coarse thyroid calcifications are again noted in the midpole
nodule on the right.
3.  Again no thyroid nodules are observed on the left.
4.  The thyroid echotexture is homogeneous.

[REDACTED]

## 2011-11-15 ENCOUNTER — Ambulatory Visit: Payer: Self-pay | Admitting: Family Medicine

## 2013-02-03 ENCOUNTER — Ambulatory Visit: Payer: Self-pay | Admitting: Family Medicine

## 2013-02-03 IMAGING — MG MM CAD SCREENING MAMMO
1 series · 6 of 6 positions shown · non-contrast
Comparison: Previous exam(s).

REASON FOR EXAM: scr mammo no order
COMMENTS:

PROCEDURE:     MAM - MAM DGTL SCRN MAM NO ORDER W/CAD  - [DATE] [DATE]
CLINICAL DATA: Screening.
DIGITAL SCREENING BILATERAL MAMMOGRAM WITH CAD

[R CC · right · 6 of 6 slices shown]
[im 1/6]
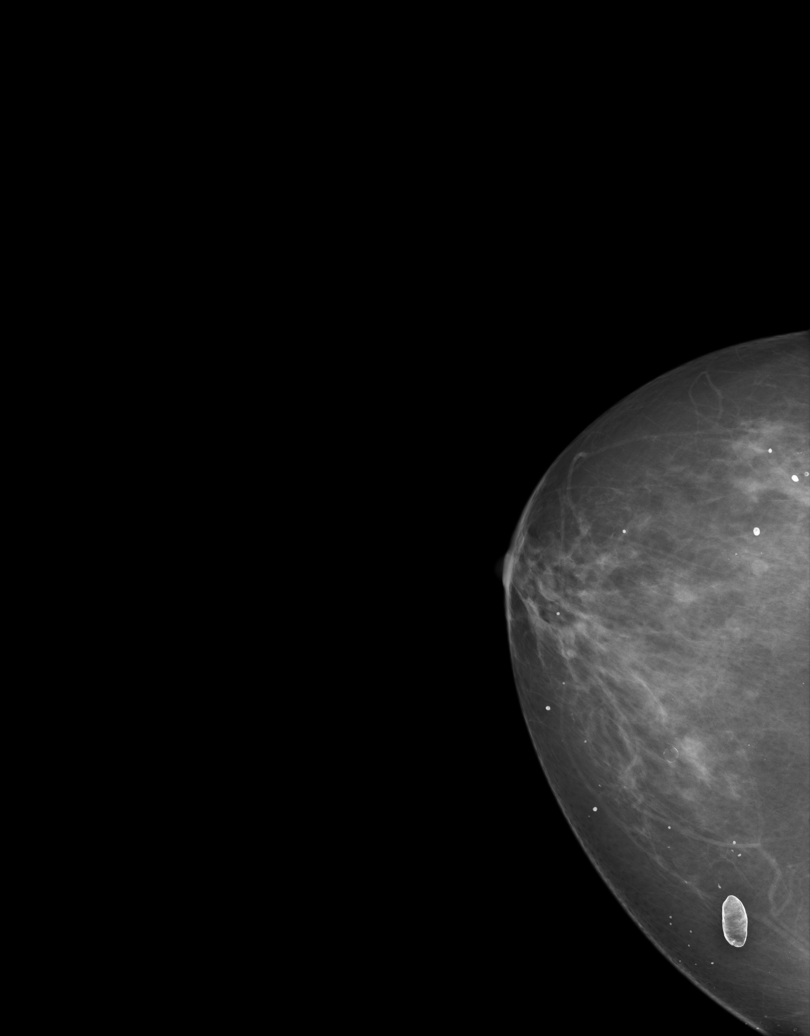
[im 2/6]
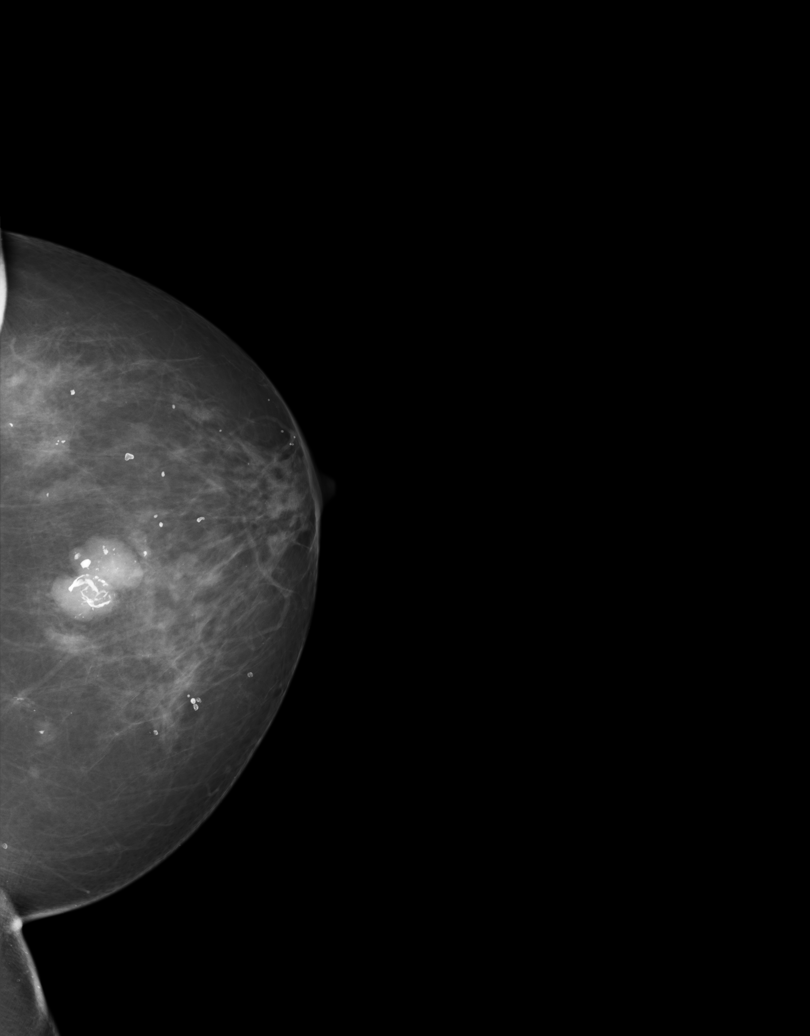
[im 3/6]
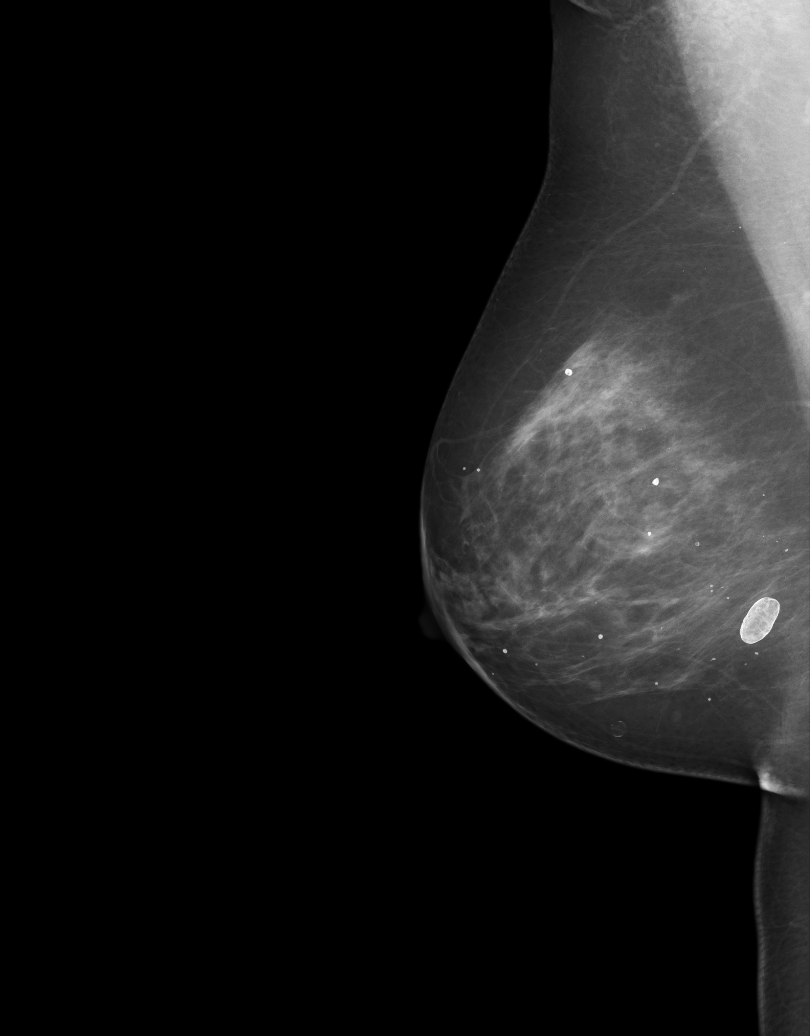
[im 4/6]
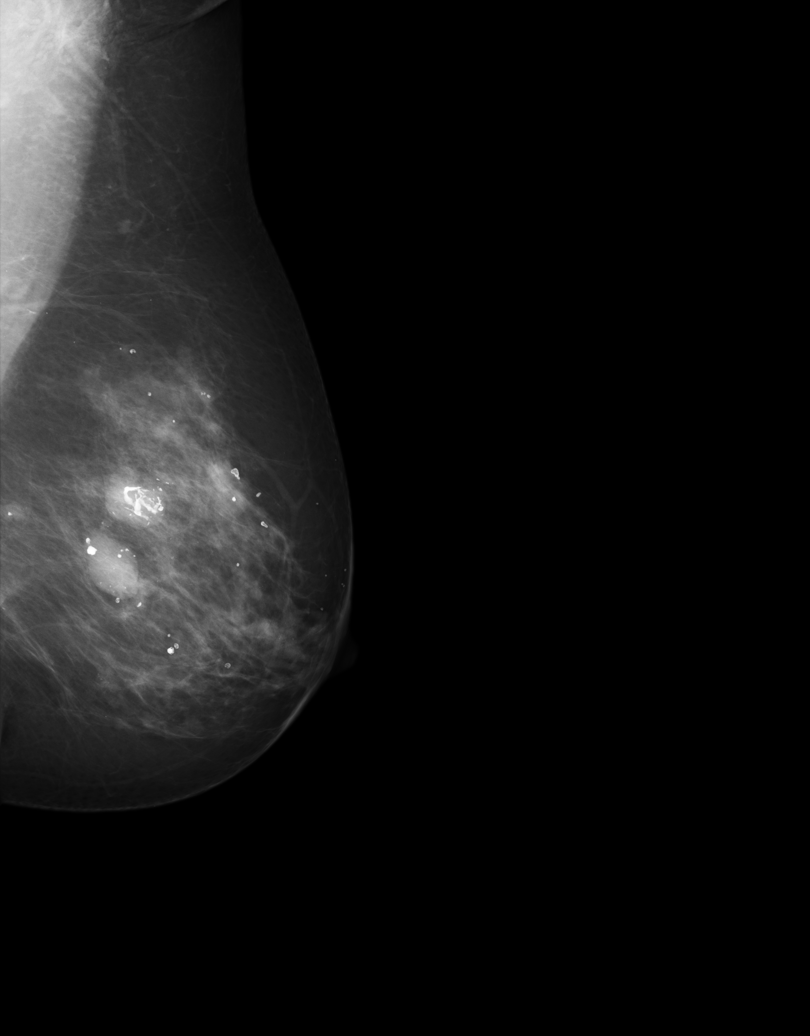
[im 5/6]
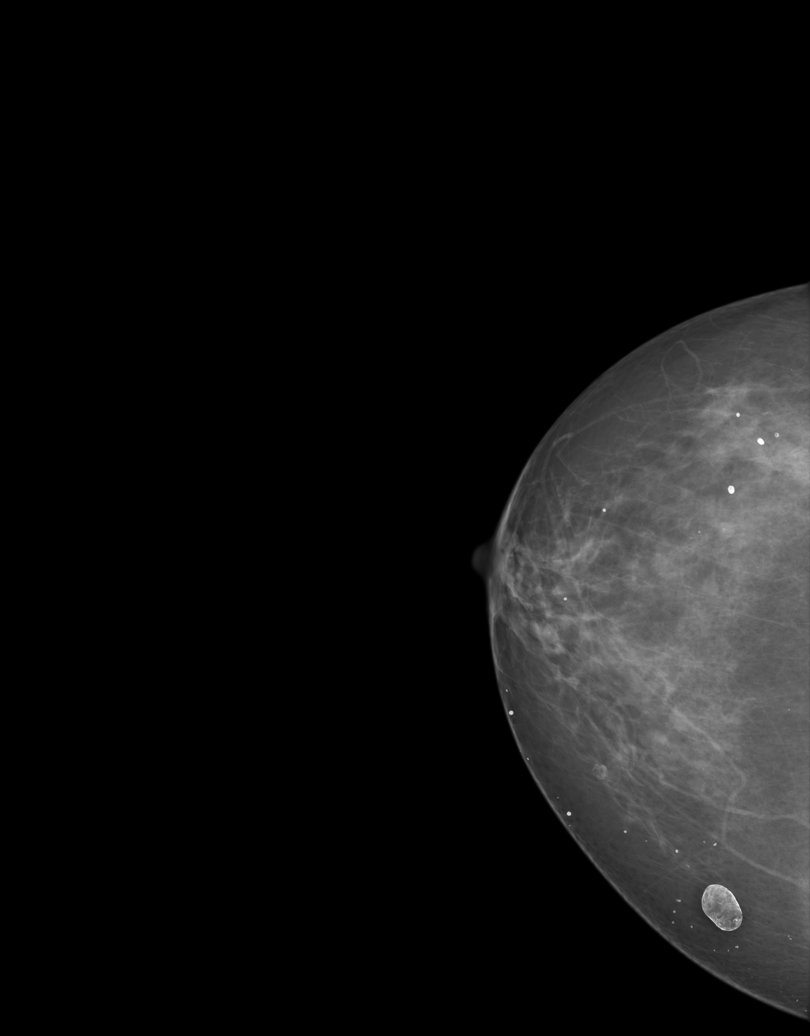
[im 6/6]
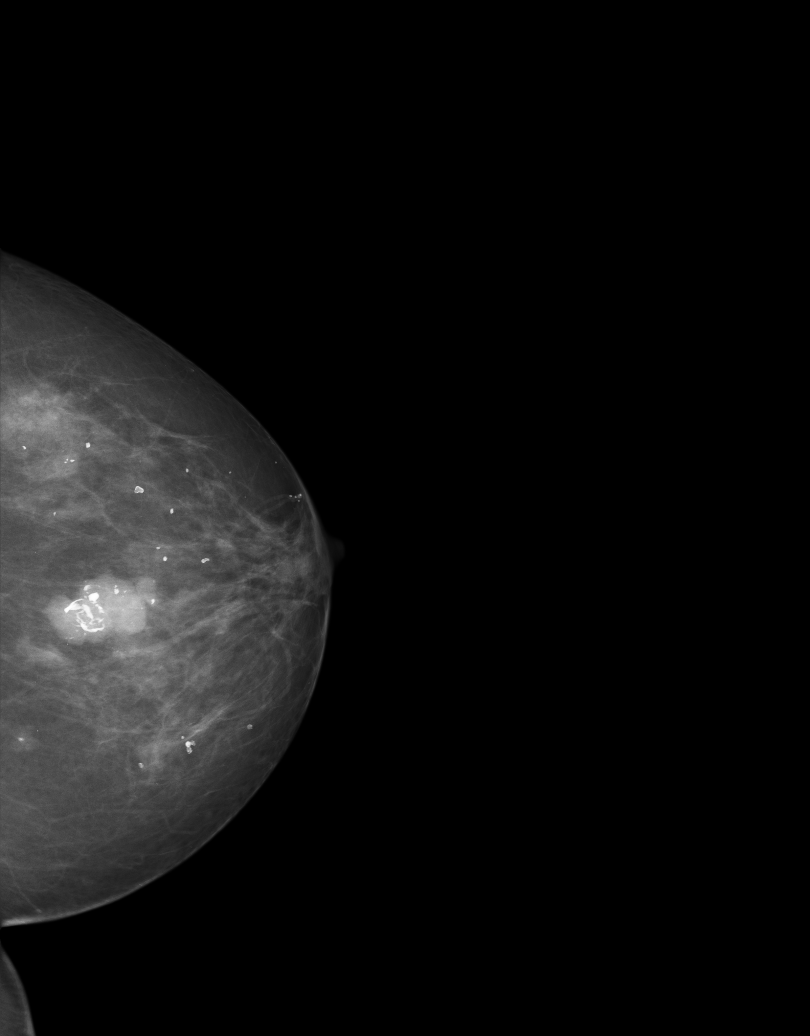

[6 of 6 positions shown; findings below may reference images not displayed]

FINDINGS: ACR Breast Density Category c:  The breast tissue is
heterogeneously dense, which may obscure small masses.

There are no findings suspicious for malignancy.

Images were processed with CAD.
IMPRESSION: No mammographic evidence of malignancy.

A result letter of this screening mammogram will be mailed directly
to the patient.

RECOMMENDATION:
Screening mammogram in one year. (Code:[F7])

BI-RADS CATEGORY 1:  Negative.

## 2013-02-20 ENCOUNTER — Ambulatory Visit: Payer: Self-pay | Admitting: Family Medicine

## 2013-02-20 IMAGING — CR DG SHOULDER 3+V*L*
1 series · 4 of 4 positions shown · non-contrast
Comparison: none

REASON FOR EXAM: shoulder Dislocation
COMMENTS:

[Series 1: w shoulder external left · 0.14mm/px · 4 of 4 slices shown]
[im 1/4]
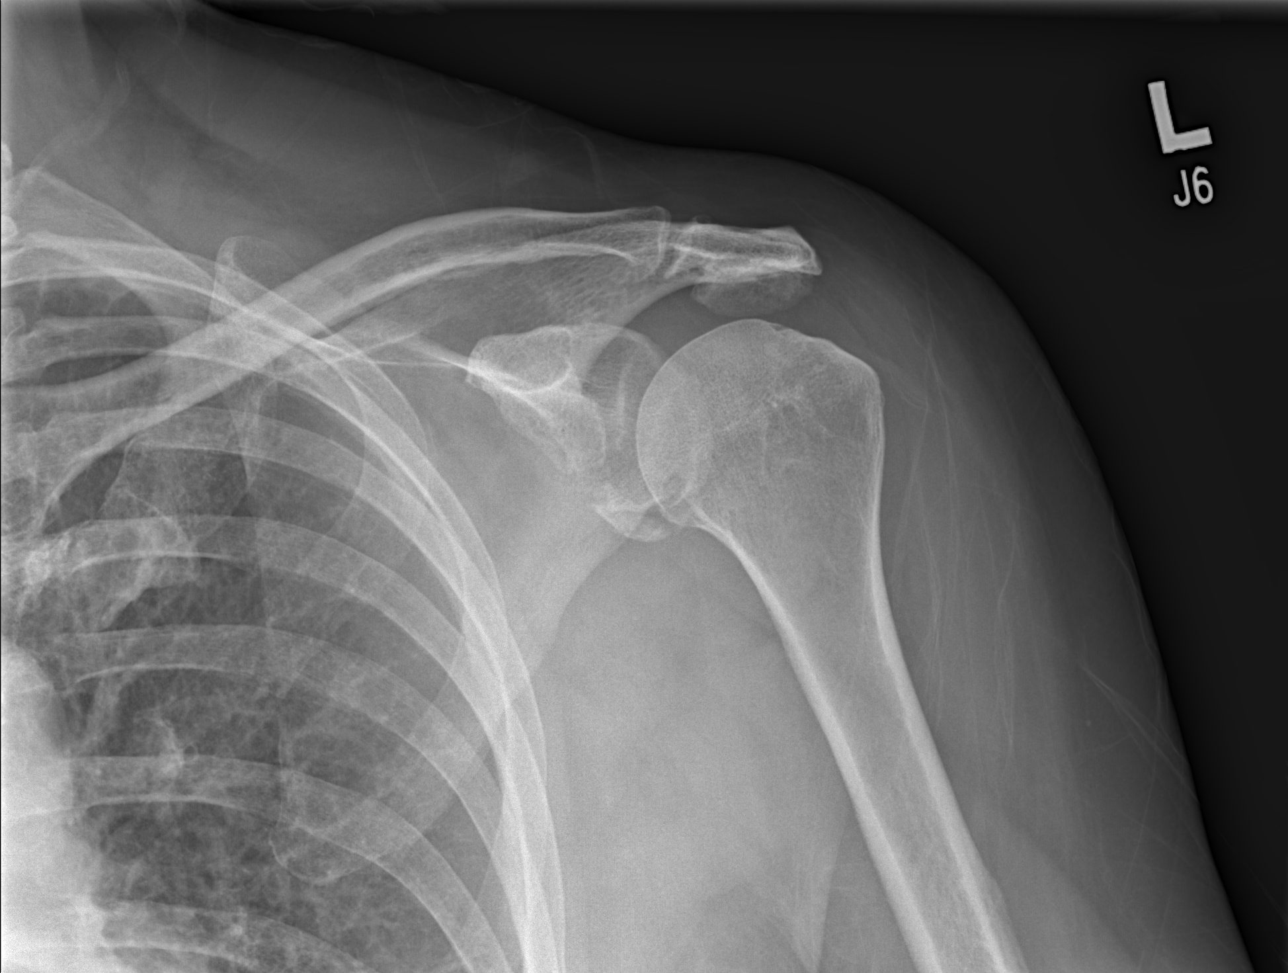
[im 2/4]
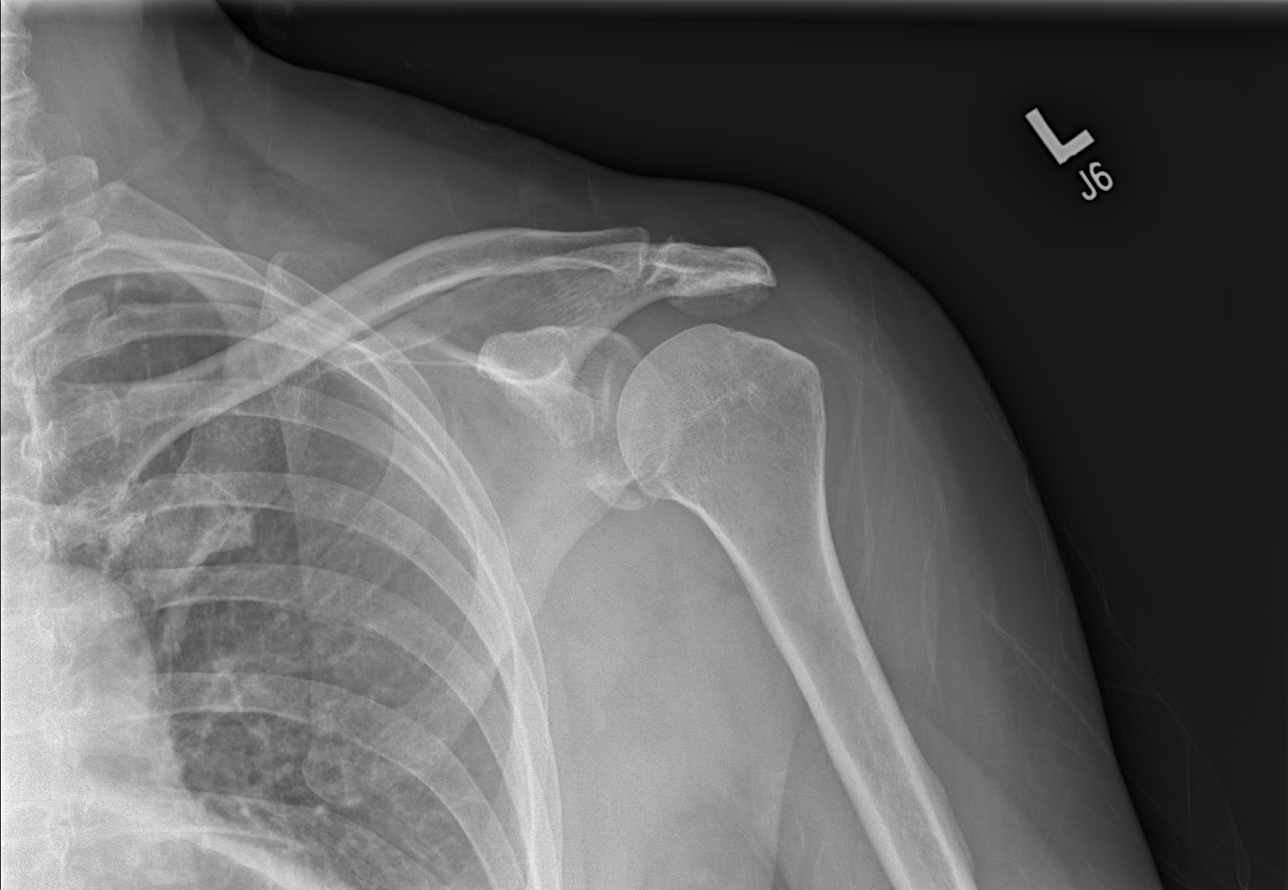
[im 3/4]
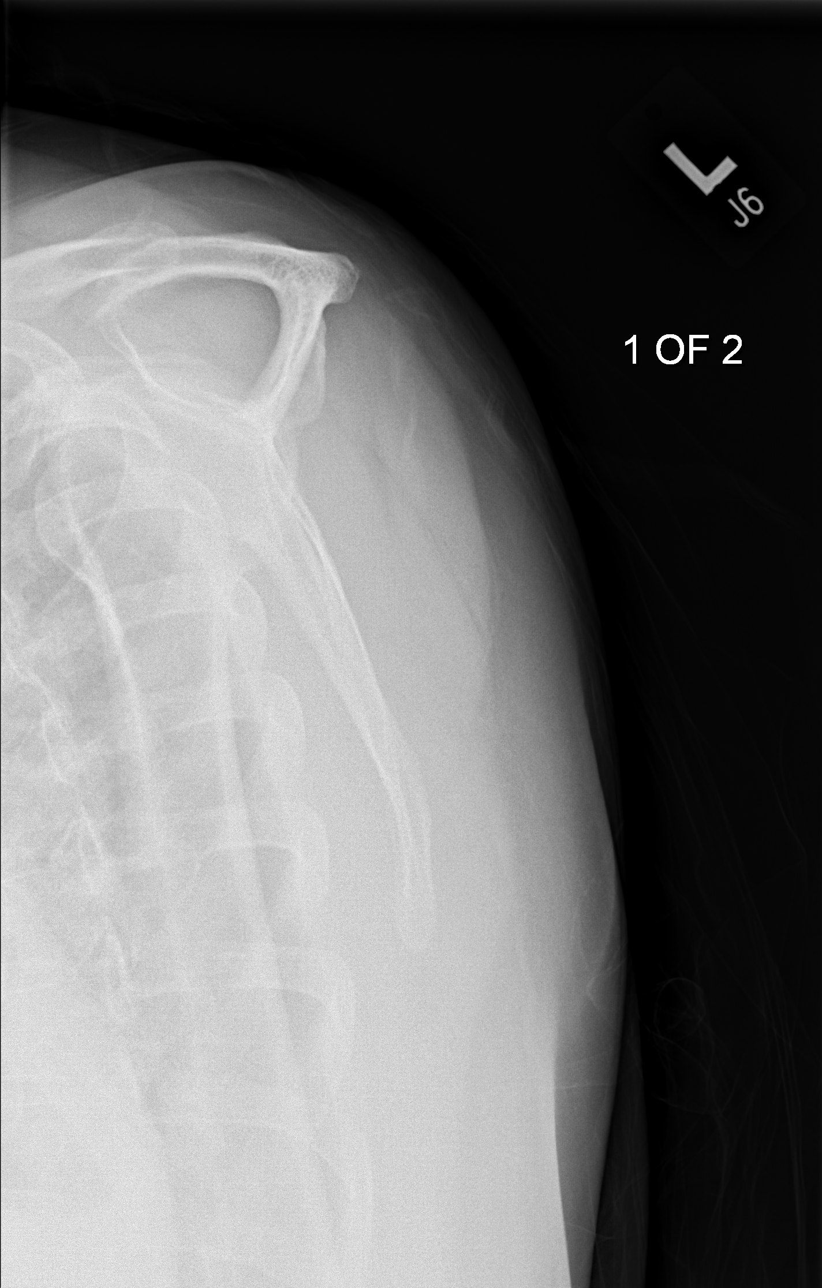
[im 4/4]
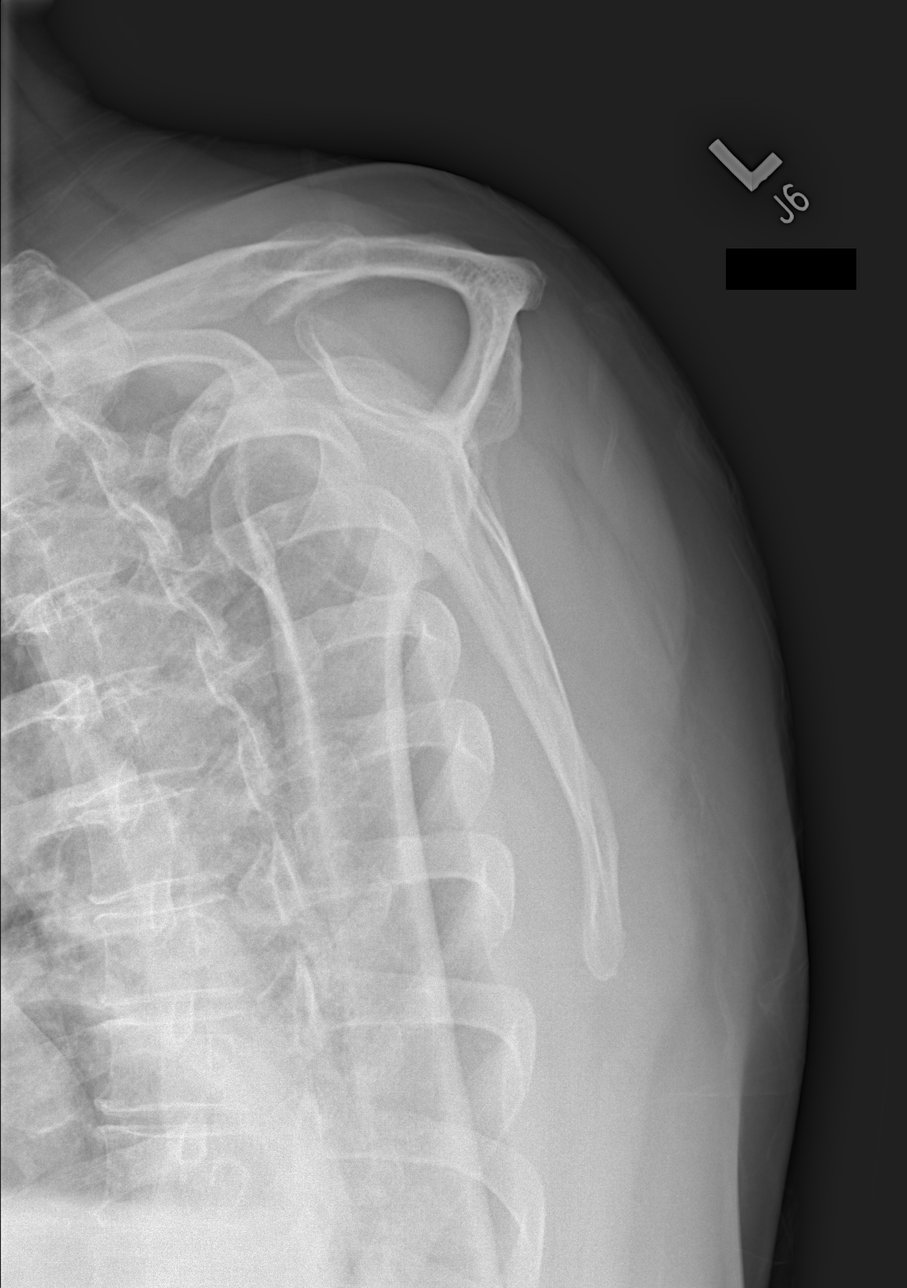

[4 of 4 positions shown; findings below may reference images not displayed]

PROCEDURE:     DXR - DXR SHOULDER LEFT COMPLETE  - [DATE]  [DATE]

RESULT:

The patient has reported history of shoulder dislocation. An area of osseous
density with partial cortication projects along the inferior aspect of the
glenoid. This may represent an osteophyte or possibly an avulsion fragment.
Large osteophyte projects along the undersurface of the acromion. The
humeral head is located.
IMPRESSION: 1.  Osteophyte versus avulsion fragment along the inferior aspect of the
glenoid.
2.  Osteoarthritic changes within the shoulder.
3.  If clinically warranted, further evaluation with cross sectional
imaging, MRI if possible or CT is recommended.

## 2013-07-31 IMAGING — US US SOFT TISSUE HEAD/NECK
1 series · 14 of 25 positions shown · non-contrast
Comparison: [DATE].

CLINICAL DATA: Thyroid nodule.

EXAM:
THYROID ULTRASOUND
TECHNIQUE: Ultrasound examination of the thyroid gland and adjacent soft
tissues was performed.

[Series 1: us soft tissue head/neck · 0.07mm/px · 14 of 72 slices shown]
[im 1/72]
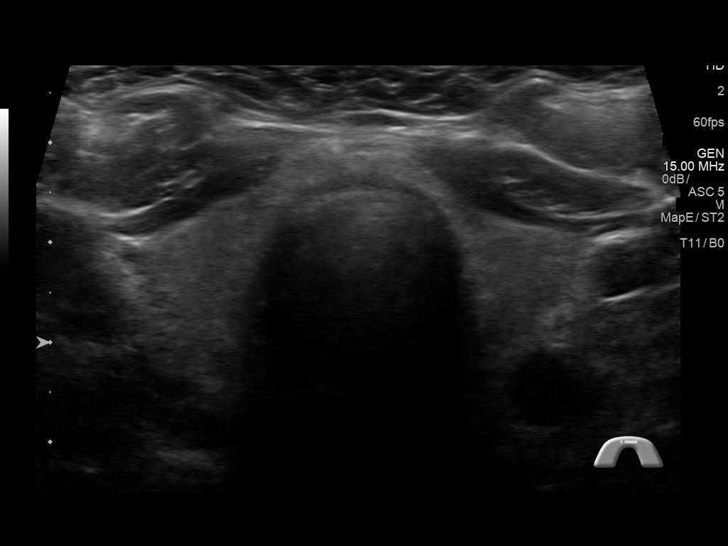
[im 6/72]
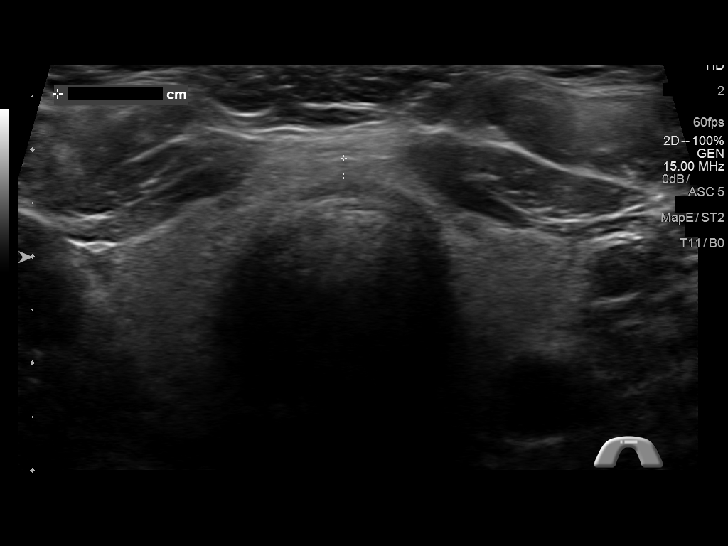
[im 12/72]
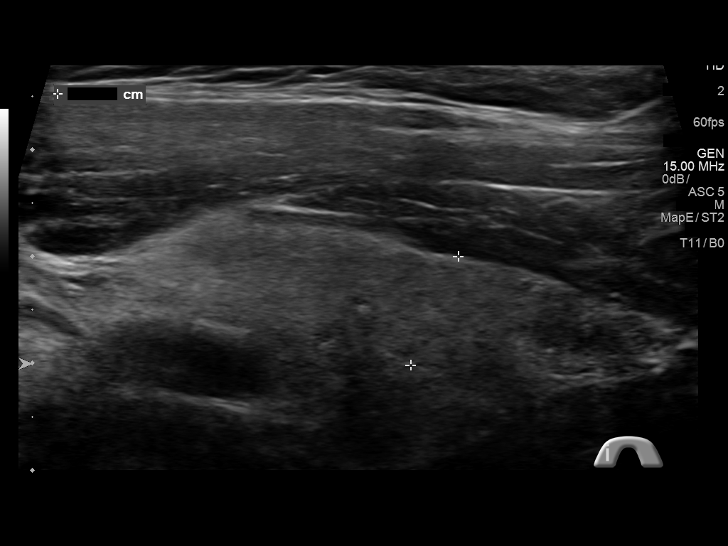
[im 18/72]
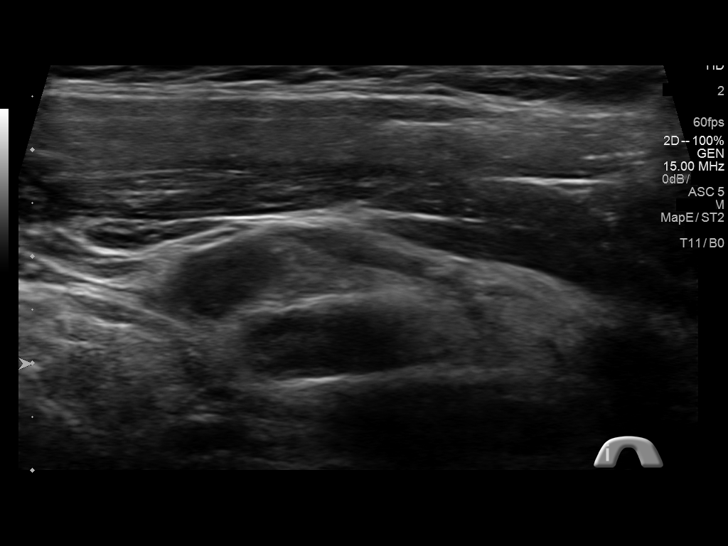
[im 24/72]
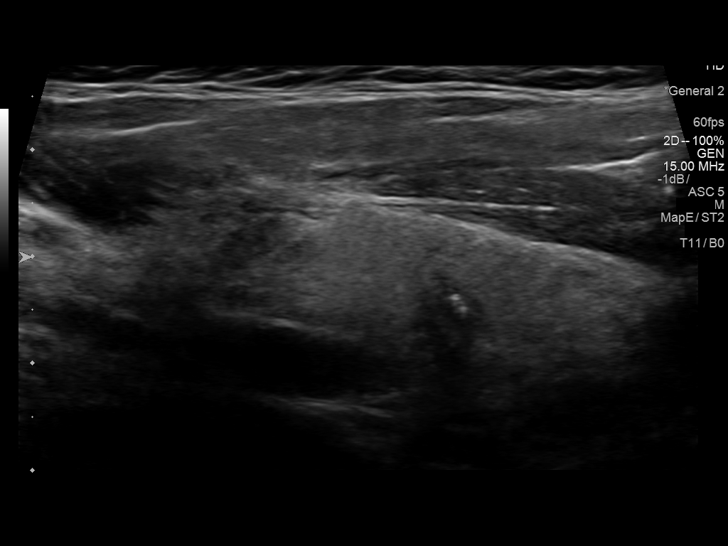
[im 27/72]
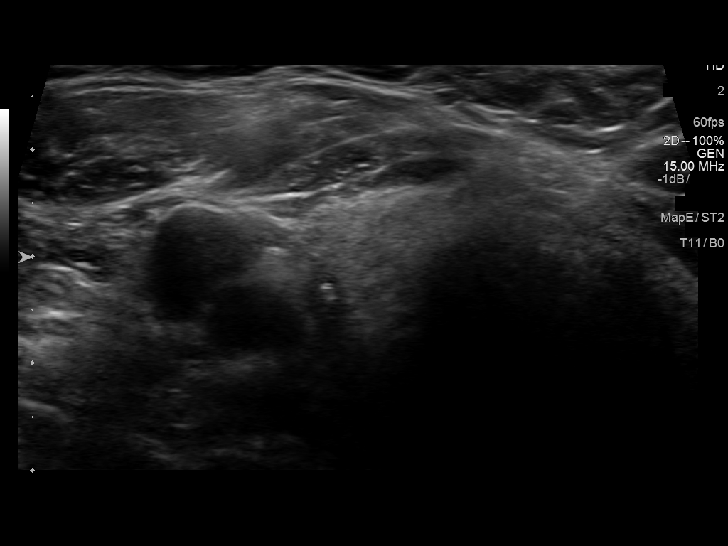
[im 33/72]
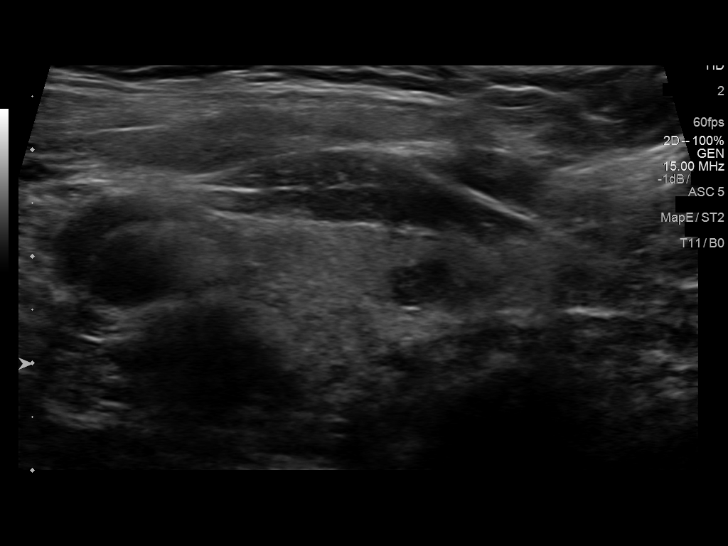
[im 39/72]
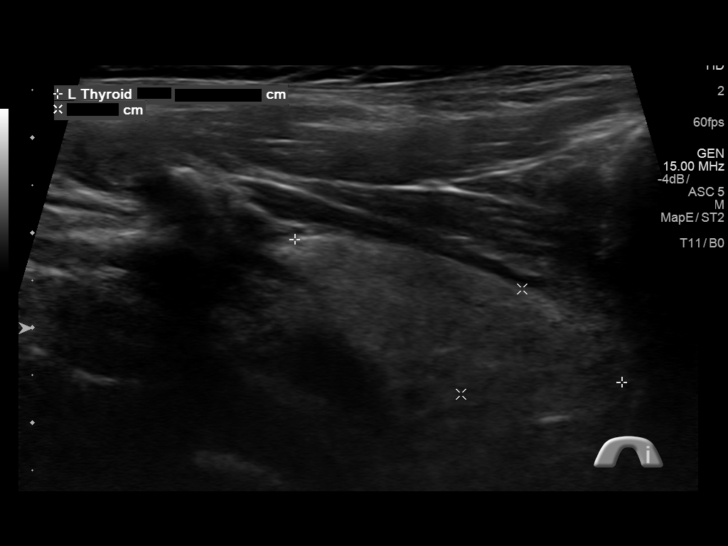
[im 45/72]
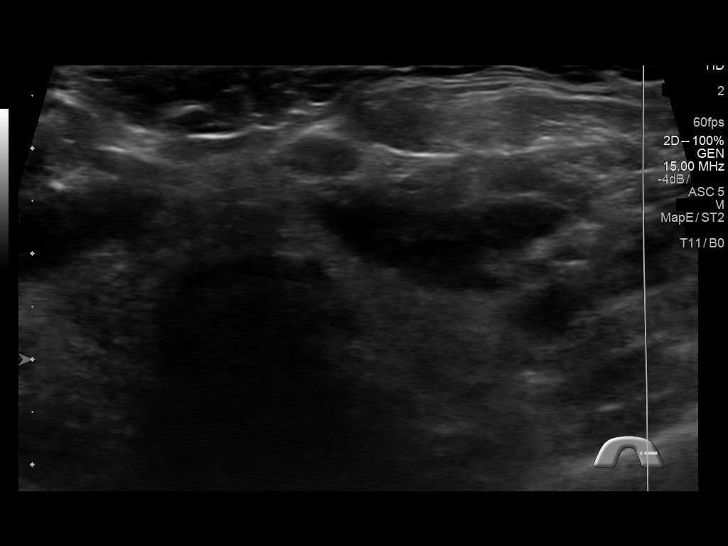
[im 48/72]
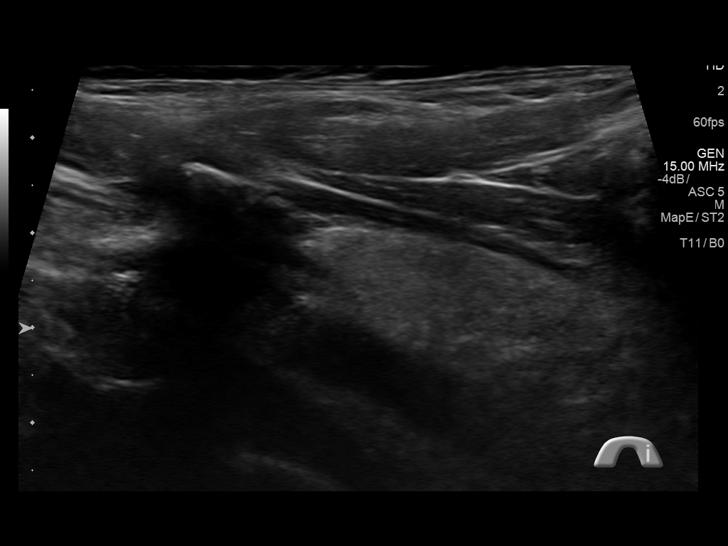
[im 54/72]
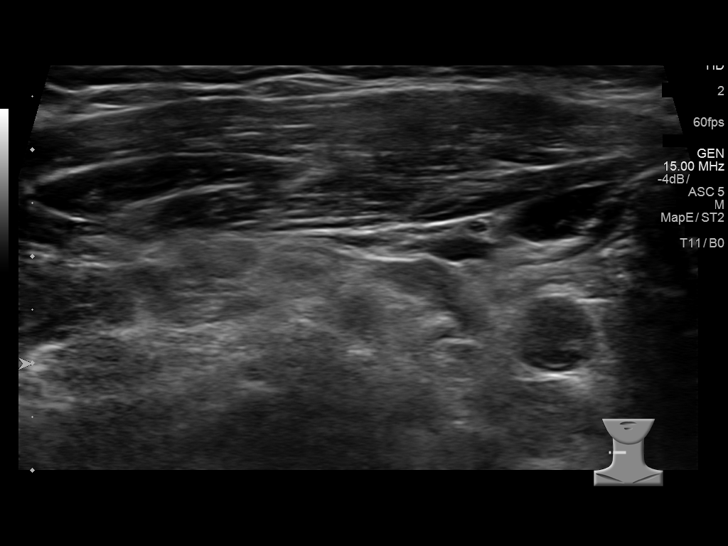
[im 60/72]
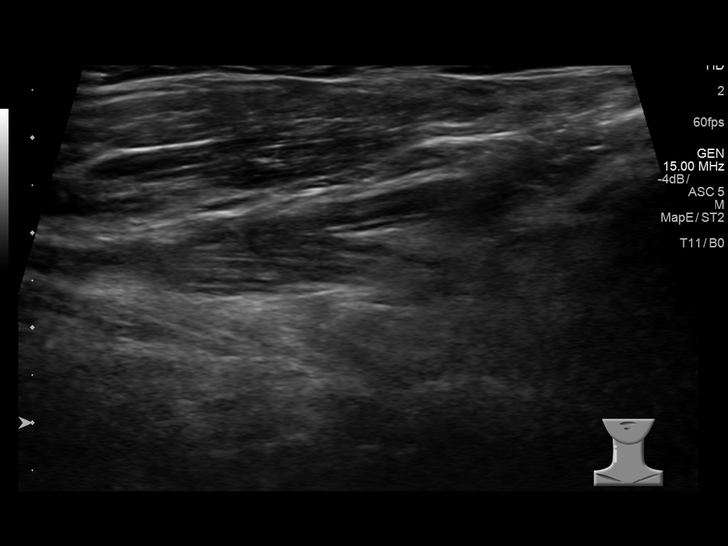
[im 66/72]
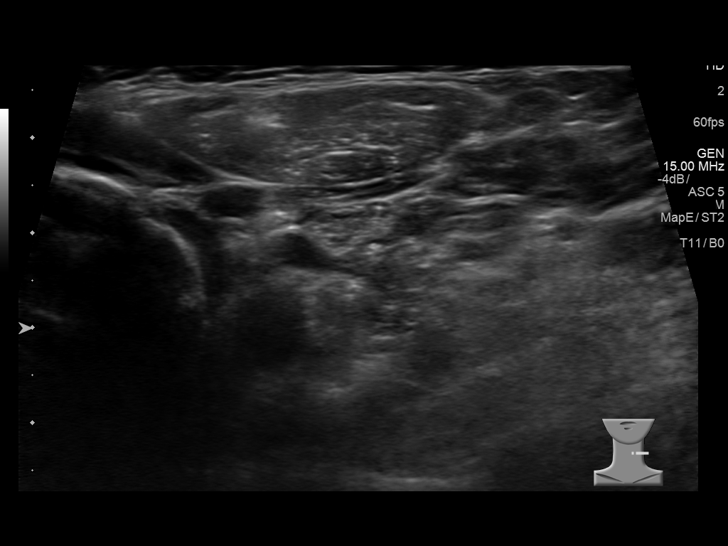
[im 72/72]
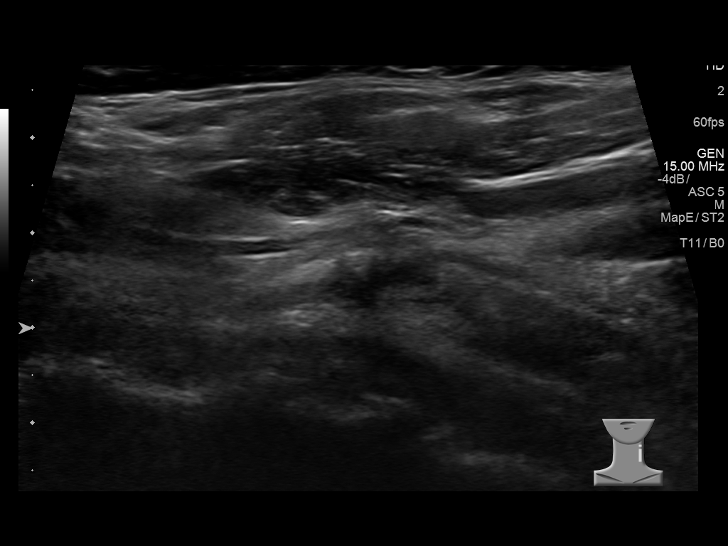

[14 of 25 positions shown; findings below may reference images not displayed]

FINDINGS: Right thyroid lobe

Measurements: 4.1 x 1.1 x 1.1 cm. 6 mm calcified nodule is noted in
midpole. This is significantly smaller compared to prior exam. 1 cm
hypoechoic nodule is noted in inferior pole.

Left thyroid lobe

Measurements: 3.8 x 1.3 x 1.3 cm.  No nodules are noted.

Isthmus

Thickness: 2 mm.  No nodules visualized.

Lymphadenopathy

None visualized.
IMPRESSION: 6 mm calcified nodule noted in midpole of right thyroid lobe which
is significantly smaller compared to prior exam. 1 cm hypoechoic
nodule is noted in inferior pole of right thyroid lobe is well.
Findings do not meet current SRU consensus criteria for biopsy.
Follow-up by clinical exam is recommended. If patient has known risk
factors for thyroid carcinoma, consider follow-up ultrasound in 12
months. If patient is clinically hyperthyroid, consider nuclear
medicine thyroid uptake and scan.Reference: Management of Thyroid
Nodules Detected at US: [employer] in Ultrasound

## 2014-02-01 ENCOUNTER — Emergency Department: Payer: Self-pay | Admitting: Emergency Medicine

## 2014-02-01 LAB — CBC WITH DIFFERENTIAL/PLATELET
BASOS ABS: 0.1 10*3/uL (ref 0.0–0.1)
Basophil %: 0.3 %
EOS ABS: 0 10*3/uL (ref 0.0–0.7)
EOS PCT: 0 %
HCT: 42.3 % (ref 35.0–47.0)
HGB: 13.6 g/dL (ref 12.0–16.0)
Lymphocyte #: 2.9 10*3/uL (ref 1.0–3.6)
Lymphocyte %: 13.6 %
MCH: 26.6 pg (ref 26.0–34.0)
MCHC: 32.1 g/dL (ref 32.0–36.0)
MCV: 83 fL (ref 80–100)
MONO ABS: 1.8 x10 3/mm — AB (ref 0.2–0.9)
MONOS PCT: 8.6 %
NEUTROS ABS: 16.6 10*3/uL — AB (ref 1.4–6.5)
Neutrophil %: 77.5 %
Platelet: 272 10*3/uL (ref 150–440)
RBC: 5.09 10*6/uL (ref 3.80–5.20)
RDW: 16.2 % — AB (ref 11.5–14.5)
WBC: 21.4 10*3/uL — ABNORMAL HIGH (ref 3.6–11.0)

## 2014-02-01 LAB — URINALYSIS, COMPLETE
Bilirubin,UR: NEGATIVE
Blood: NEGATIVE
Glucose,UR: NEGATIVE mg/dL (ref 0–75)
Hyaline Cast: 4
Leukocyte Esterase: NEGATIVE
Nitrite: NEGATIVE
Ph: 7 (ref 4.5–8.0)
Protein: 30
RBC,UR: 4 /HPF (ref 0–5)
Specific Gravity: 1.023 (ref 1.003–1.030)
WBC UR: 1 /HPF (ref 0–5)

## 2014-02-01 LAB — COMPREHENSIVE METABOLIC PANEL
ALBUMIN: 3.9 g/dL (ref 3.4–5.0)
ALK PHOS: 67 U/L
Anion Gap: 7 (ref 7–16)
BUN: 38 mg/dL — AB (ref 7–18)
Bilirubin,Total: 0.7 mg/dL (ref 0.2–1.0)
CO2: 29 mmol/L (ref 21–32)
Calcium, Total: 9.4 mg/dL (ref 8.5–10.1)
Chloride: 102 mmol/L (ref 98–107)
Creatinine: 1.95 mg/dL — ABNORMAL HIGH (ref 0.60–1.30)
EGFR (African American): 30 — ABNORMAL LOW
GFR CALC NON AF AMER: 26 — AB
Glucose: 157 mg/dL — ABNORMAL HIGH (ref 65–99)
Osmolality: 288 (ref 275–301)
POTASSIUM: 3.2 mmol/L — AB (ref 3.5–5.1)
SGOT(AST): 45 U/L — ABNORMAL HIGH (ref 15–37)
SGPT (ALT): 24 U/L
Sodium: 138 mmol/L (ref 136–145)
Total Protein: 8 g/dL (ref 6.4–8.2)

## 2014-02-01 LAB — LIPASE, BLOOD: Lipase: 156 U/L (ref 73–393)

## 2014-02-01 LAB — TROPONIN I: Troponin-I: <0.02 ng/mL

## 2014-02-05 LAB — BETA STREP CULTURE(ARMC)

## 2014-04-30 ENCOUNTER — Emergency Department: Payer: Self-pay | Admitting: Student

## 2014-04-30 LAB — COMPREHENSIVE METABOLIC PANEL
ALBUMIN: 3.9 g/dL (ref 3.4–5.0)
Alkaline Phosphatase: 73 U/L
Anion Gap: 14 (ref 7–16)
BILIRUBIN TOTAL: 0.6 mg/dL (ref 0.2–1.0)
BUN: 37 mg/dL — ABNORMAL HIGH (ref 7–18)
Calcium, Total: 8.8 mg/dL (ref 8.5–10.1)
Chloride: 102 mmol/L (ref 98–107)
Co2: 28 mmol/L (ref 21–32)
Creatinine: 1.75 mg/dL — ABNORMAL HIGH (ref 0.60–1.30)
EGFR (African American): 37 — ABNORMAL LOW
EGFR (Non-African Amer.): 31 — ABNORMAL LOW
Glucose: 120 mg/dL — ABNORMAL HIGH (ref 65–99)
OSMOLALITY: 297 (ref 275–301)
POTASSIUM: 3.3 mmol/L — AB (ref 3.5–5.1)
SGOT(AST): 32 U/L (ref 15–37)
SGPT (ALT): 27 U/L
Sodium: 144 mmol/L (ref 136–145)
Total Protein: 7.6 g/dL (ref 6.4–8.2)

## 2014-04-30 LAB — CBC WITH DIFFERENTIAL/PLATELET
BASOS ABS: 0.1 10*3/uL (ref 0.0–0.1)
Basophil %: 0.9 %
EOS ABS: 0 10*3/uL (ref 0.0–0.7)
Eosinophil %: 0.1 %
HCT: 39 % (ref 35.0–47.0)
HGB: 12.3 g/dL (ref 12.0–16.0)
LYMPHS ABS: 1.8 10*3/uL (ref 1.0–3.6)
Lymphocyte %: 11.7 %
MCH: 25.6 pg — ABNORMAL LOW (ref 26.0–34.0)
MCHC: 31.5 g/dL — ABNORMAL LOW (ref 32.0–36.0)
MCV: 81 fL (ref 80–100)
MONO ABS: 1.3 x10 3/mm — AB (ref 0.2–0.9)
Monocyte %: 8.7 %
NEUTROS ABS: 11.8 10*3/uL — AB (ref 1.4–6.5)
Neutrophil %: 78.6 %
Platelet: 265 10*3/uL (ref 150–440)
RBC: 4.79 10*6/uL (ref 3.80–5.20)
RDW: 16.7 % — ABNORMAL HIGH (ref 11.5–14.5)
WBC: 15.1 10*3/uL — ABNORMAL HIGH (ref 3.6–11.0)

## 2014-04-30 LAB — TROPONIN I: Troponin-I: <0.02 ng/mL

## 2014-04-30 LAB — URINALYSIS, COMPLETE
BILIRUBIN, UR: NEGATIVE
Bacteria: NONE SEEN
Blood: NEGATIVE
Glucose,UR: NEGATIVE mg/dL (ref 0–75)
KETONE: NEGATIVE
LEUKOCYTE ESTERASE: NEGATIVE
Nitrite: NEGATIVE
Ph: 7 (ref 4.5–8.0)
RBC,UR: 2 /HPF (ref 0–5)
SPECIFIC GRAVITY: 1.018 (ref 1.003–1.030)
Squamous Epithelial: 1
Transitional Epi: <1
WBC UR: 1 /HPF (ref 0–5)

## 2014-04-30 LAB — MAGNESIUM: Magnesium: 1.9 mg/dL

## 2014-04-30 LAB — LIPASE, BLOOD: LIPASE: 138 U/L (ref 73–393)

## 2014-04-30 IMAGING — CT CT ABD-PELV W/ CM
2 of 5 series · 16 of 46 positions shown, 18 images · IV contrast (agent unspecified)
Comparison: Radiograph performed earlier today

CLINICAL DATA: Initial evaluationPt states she has been throwing up
dark vomit since last night and has had alot of congestion lately.

EXAM:
CT ABDOMEN AND PELVIS WITH CONTRAST
TECHNIQUE: Multidetector CT imaging of the abdomen and pelvis was performed
using the standard protocol following bolus administration of
intravenous contrast.
CONTRAST:  80 mL [US]

[Series 2: routine abd pel with · axial · 0.73mm/px · z∈[-809,-404]mm · 13 of 93 slices shown, 15 images]
[im 6/93  soft-tissue]
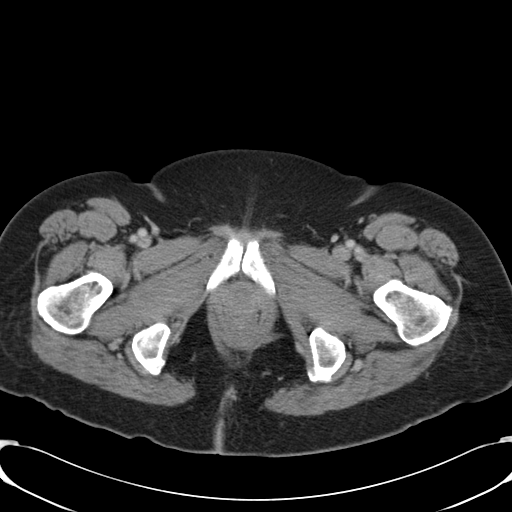
[im 6/93  bone]
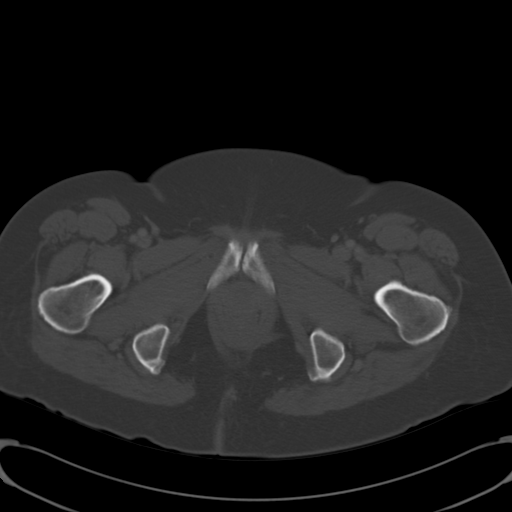
[im 11/93  soft-tissue]
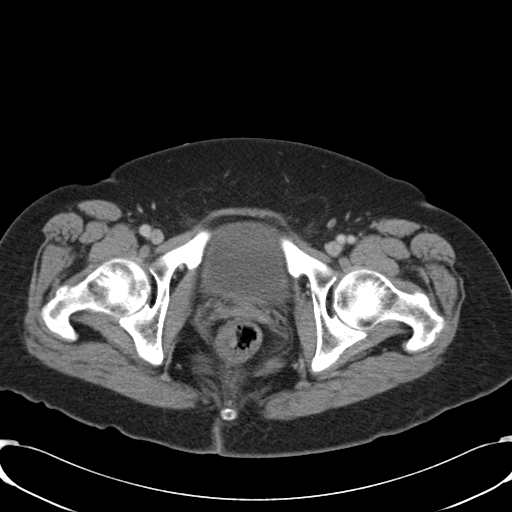
[im 22/93  soft-tissue]
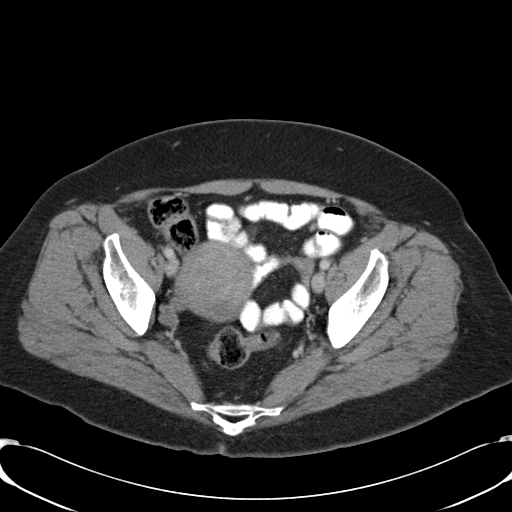
[im 28/93  soft-tissue]
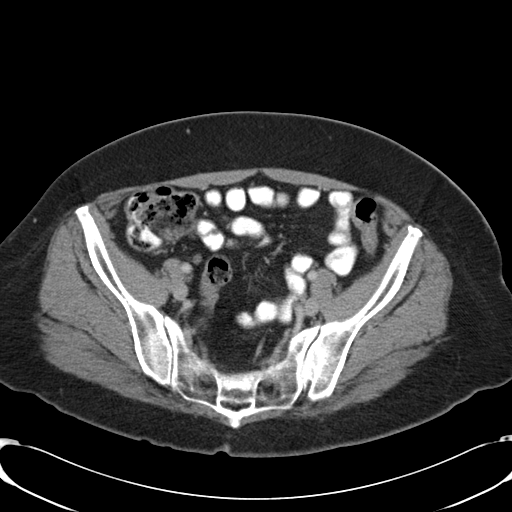
[im 33/93  soft-tissue]
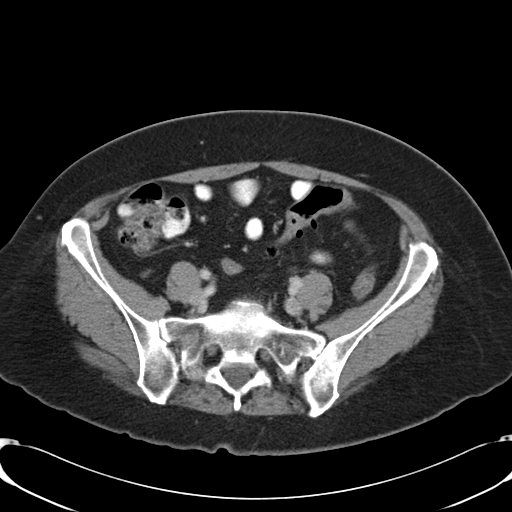
[im 38/93  soft-tissue]
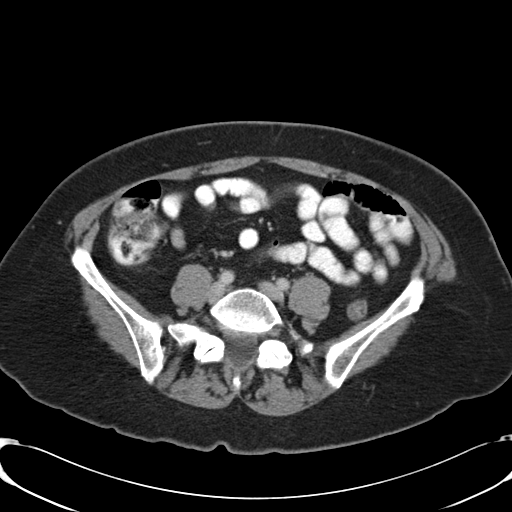
[im 49/93  soft-tissue]
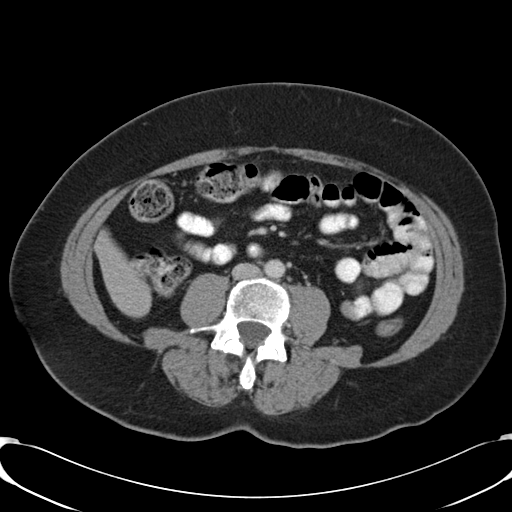
[im 55/93  soft-tissue]
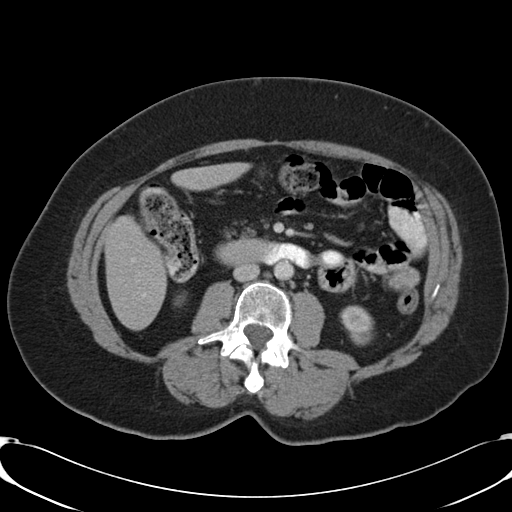
[im 60/93  soft-tissue]
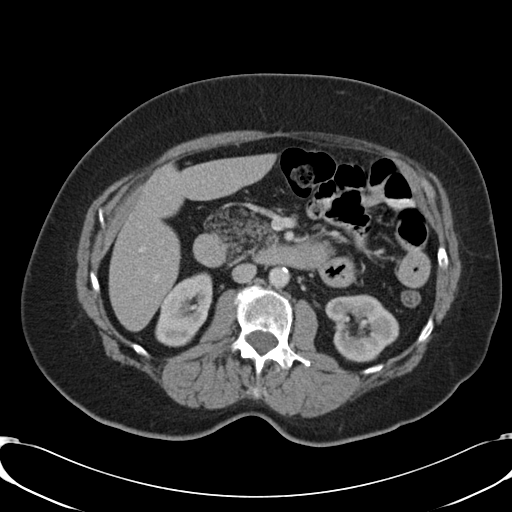
[im 60/93  bone]
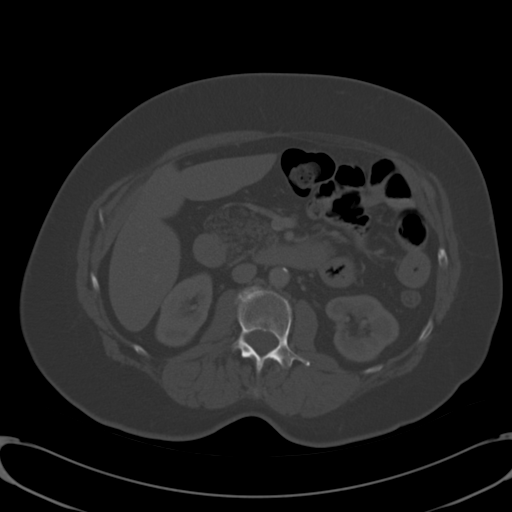
[im 65/93  soft-tissue]
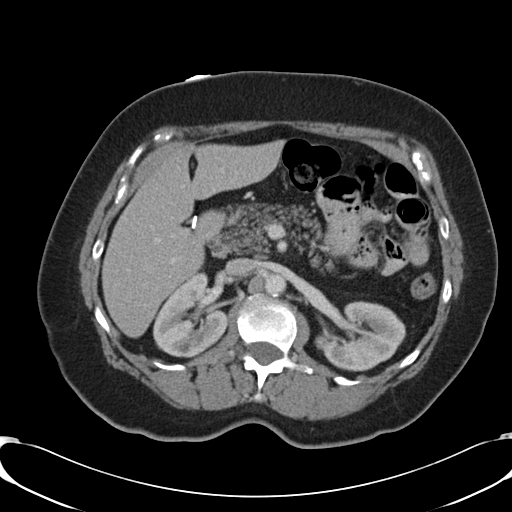
[im 71/93  soft-tissue]
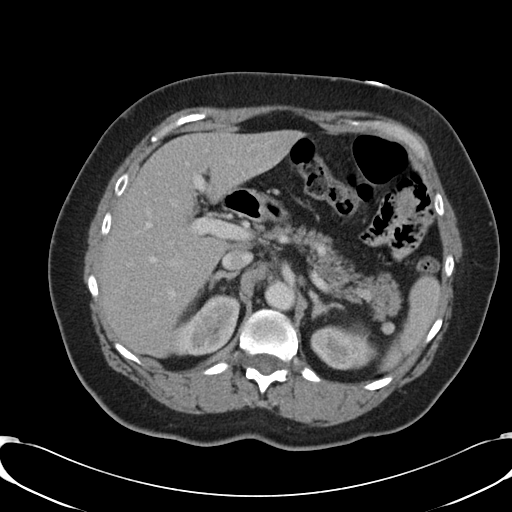
[im 82/93  soft-tissue]
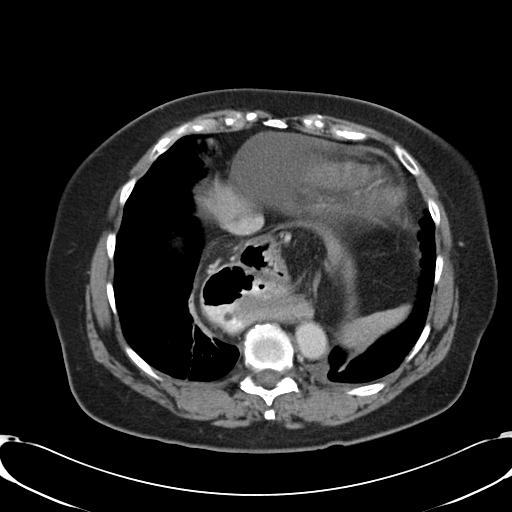
[im 87/93  soft-tissue]
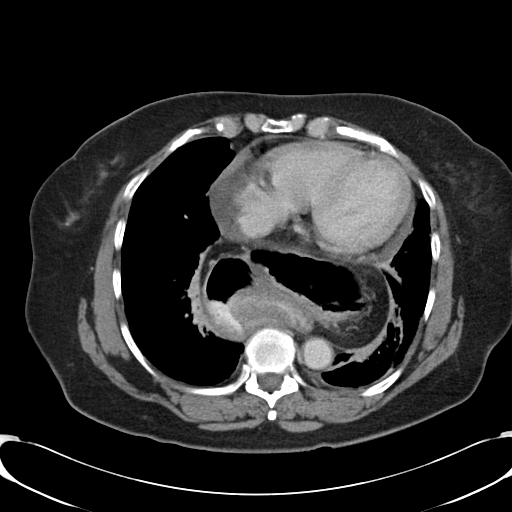

[Series 5: cor routine abd pel with · coronal · 0.72mm/px · 3 of 128 slices shown]
[im 43/128  soft-tissue]
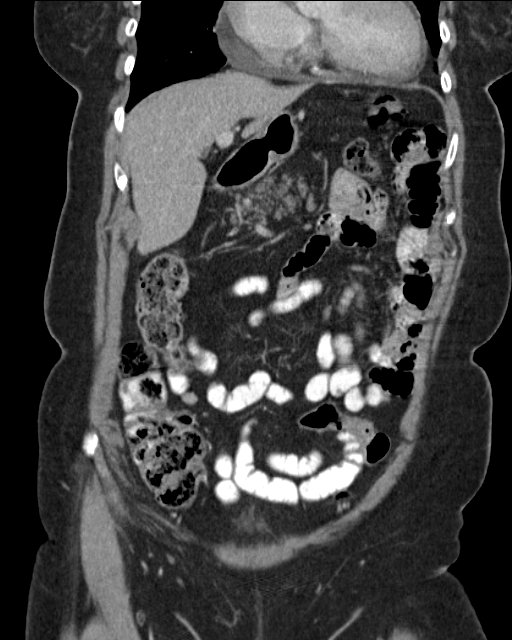
[im 57/128  soft-tissue]
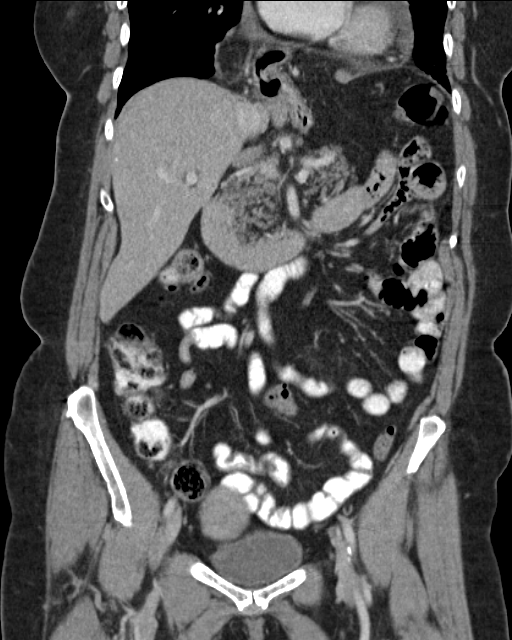
[im 71/128  soft-tissue]
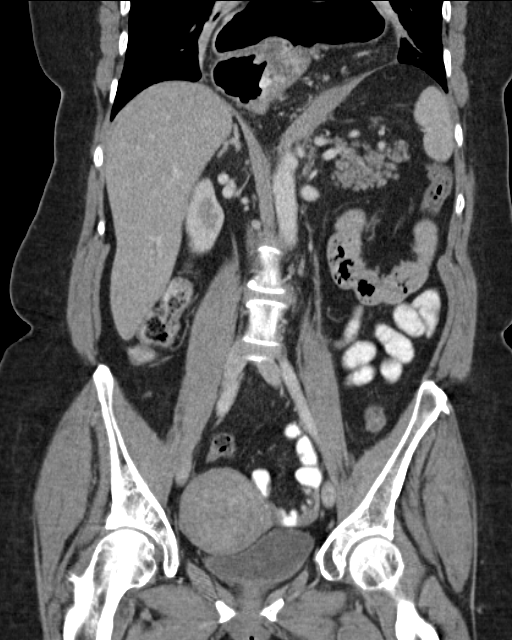

[16 of 46 positions shown; findings below may reference images not displayed]

FINDINGS: There is a very large sliding hiatal hernia with most of the stomach
above the diaphragm. The entire stomach is not visualized. No
evidence of pneumatosis. Gastroduodenal junction in normal position.

Small to moderate pericardial effusion measuring up to 2.9 cm along
the right heart border. Mild bilateral lower lobe atelectasis.

2 cm left lobe liver cyst. Mild hepatic steatosis. Pancreas and
spleen are normal. Adrenal glands are normal. Kidneys are normal
except for a few tiny low-attenuation lesions which are too small to
characterize but likely represent cysts.

Mild abdominal aortic calcification. Small bowel, large bowel, and
appendix are normal.

No ascites or significant adenopathy. Bladder and reproductive
organs are normal. No acute musculoskeletal findings. Splenic and a
partially visualized bilateral perihilar calcifications suggest
prior granulomatous exposure. Partially calcified left breast mass
upper inner quadrant.
IMPRESSION: 1. Very large hiatal hernia
2. Numerous nonacute findings including pericardial effusion.
3. Left breast mass. Recommend diagnostic mammography on last
mammography has been recently performed.

## 2014-04-30 IMAGING — CR DG ABDOMEN 3V
1 series · 4 of 4 positions shown · non-contrast
Comparison: None.

CLINICAL DATA: Vomiting, cough and congestion

EXAM:
ABDOMEN SERIES

[Series 1: dxr abdomen 3-way (incl pa cxr) · 0.14mm/px · 4 of 4 slices shown]
[im 1/4]
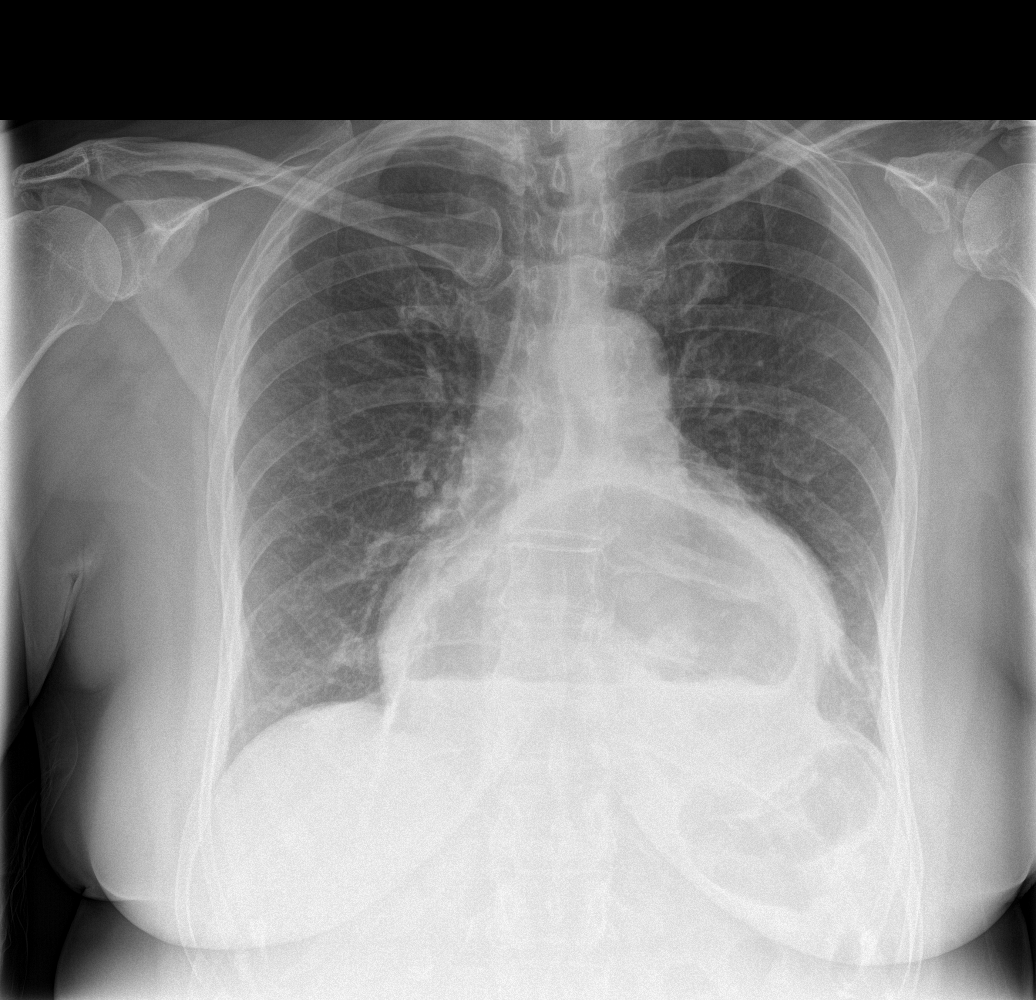
[im 2/4]
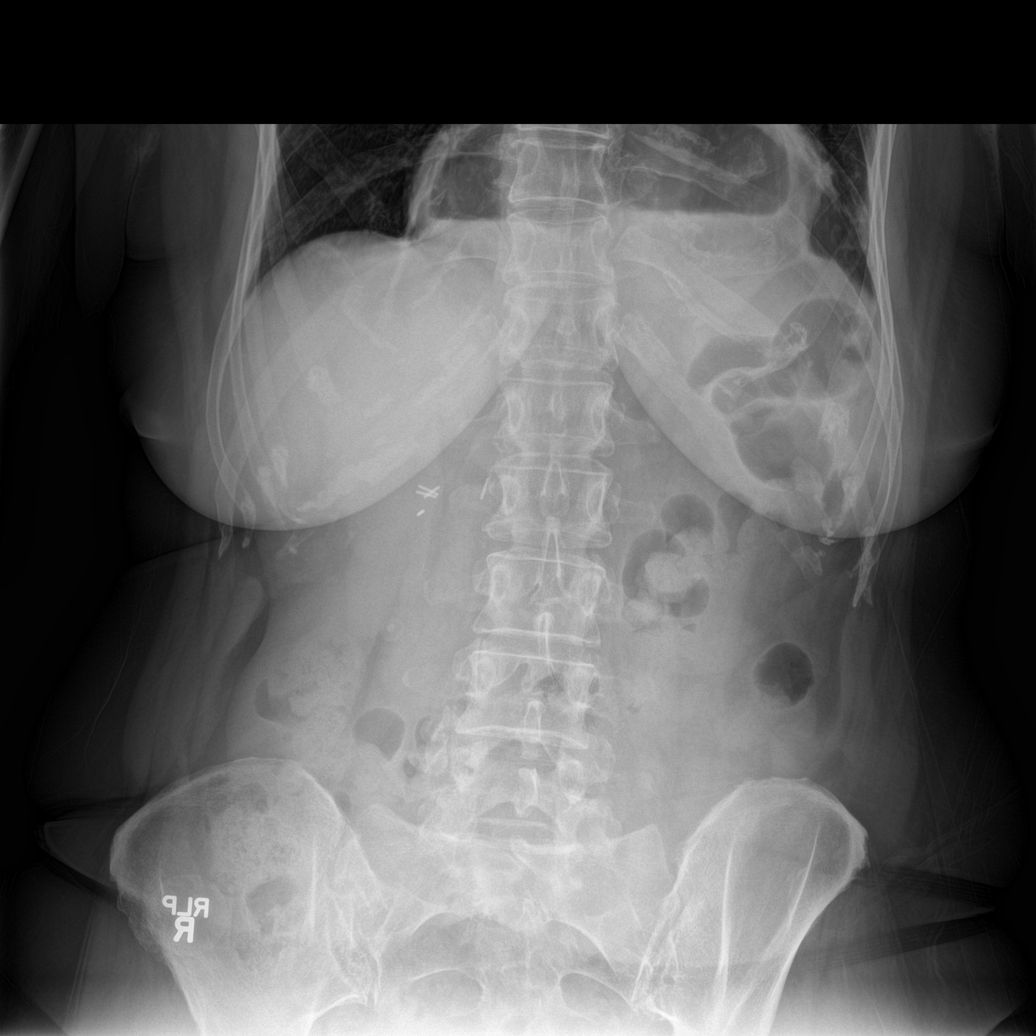
[im 3/4]
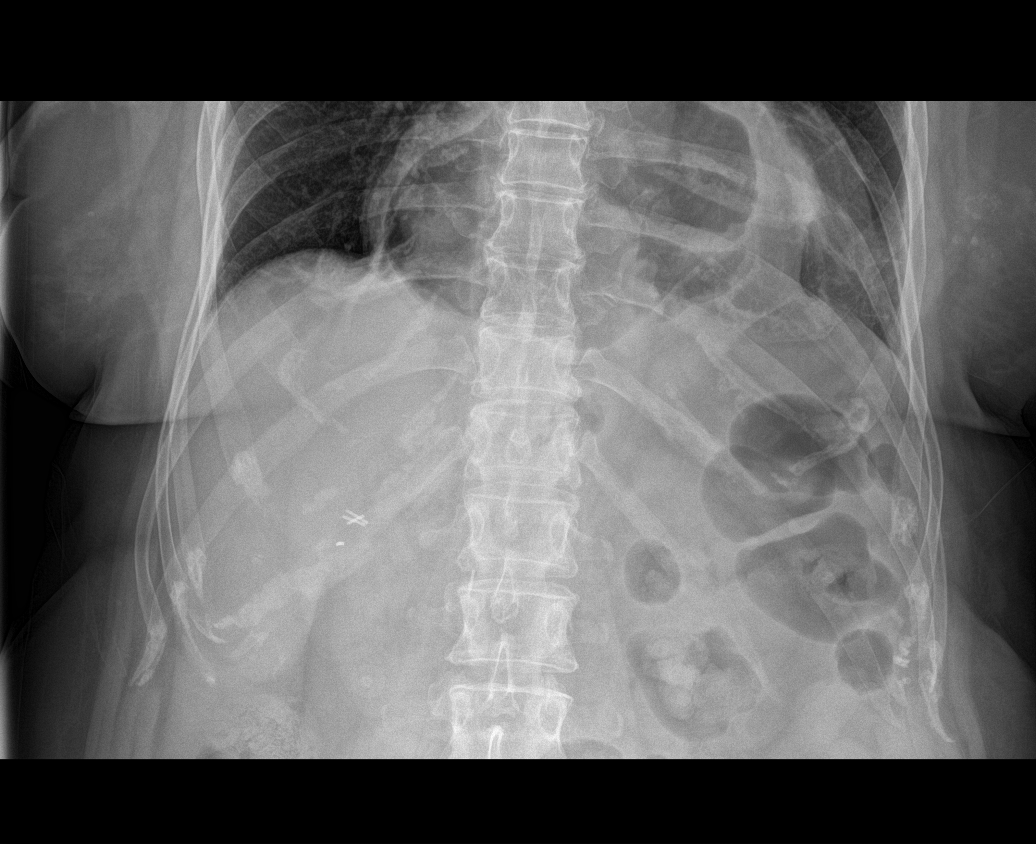
[im 4/4]
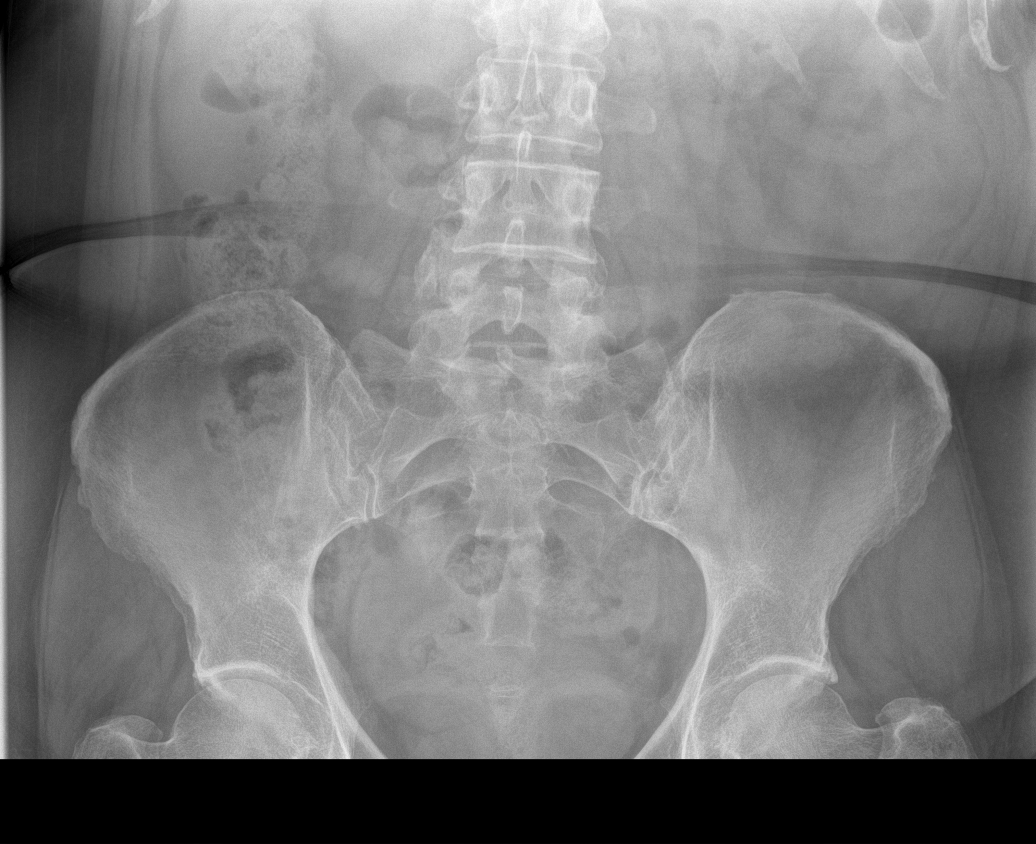

[4 of 4 positions shown; findings below may reference images not displayed]

FINDINGS: Large hiatal hernia. A moderate cardiomegaly. Left lateral
curvilinear scarring or atelectasis. Right lung is clear. No pleural
effusion. No free air.

Cholecystectomy clips are noted. Normal bowel gas pattern. No acute
osseous finding.
IMPRESSION: Nonobstructive bowel gas pattern.  No acute cardiopulmonary process.

## 2014-05-13 ENCOUNTER — Ambulatory Visit: Payer: Self-pay | Admitting: Family Medicine

## 2014-05-13 IMAGING — MG MM DIGITAL SCREENING BILAT W/ TOMO W/ CAD
8 of 12 series · 8 of 28 positions shown · non-contrast
Comparison: Previous exam(s).

CLINICAL DATA: Screening.

EXAM:
DIGITAL SCREENING BILATERAL MAMMOGRAM WITH 3D TOMO WITH CAD

[L MLO synth-2D]
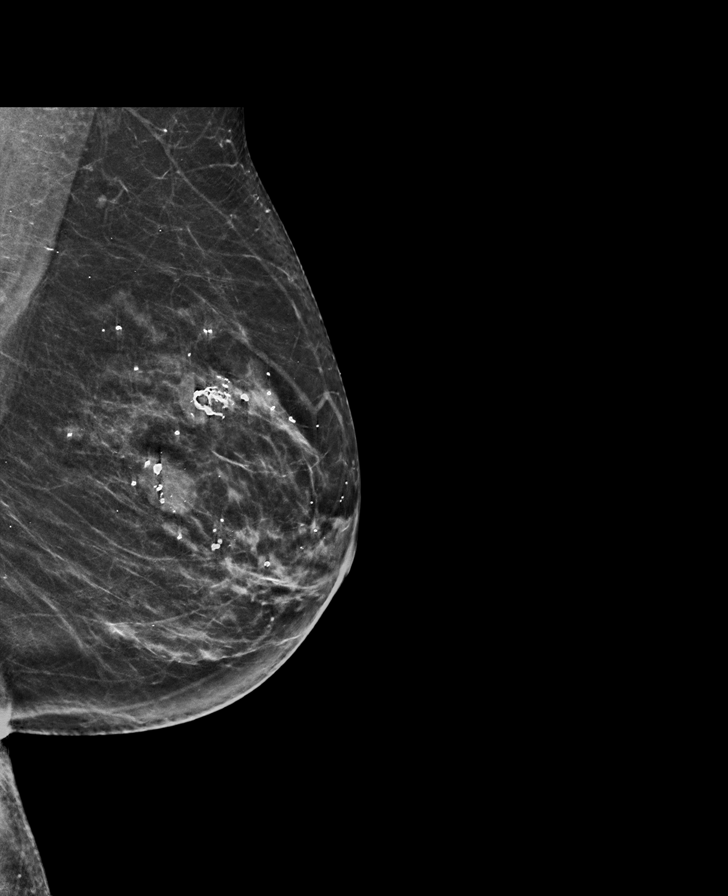

[R CC]
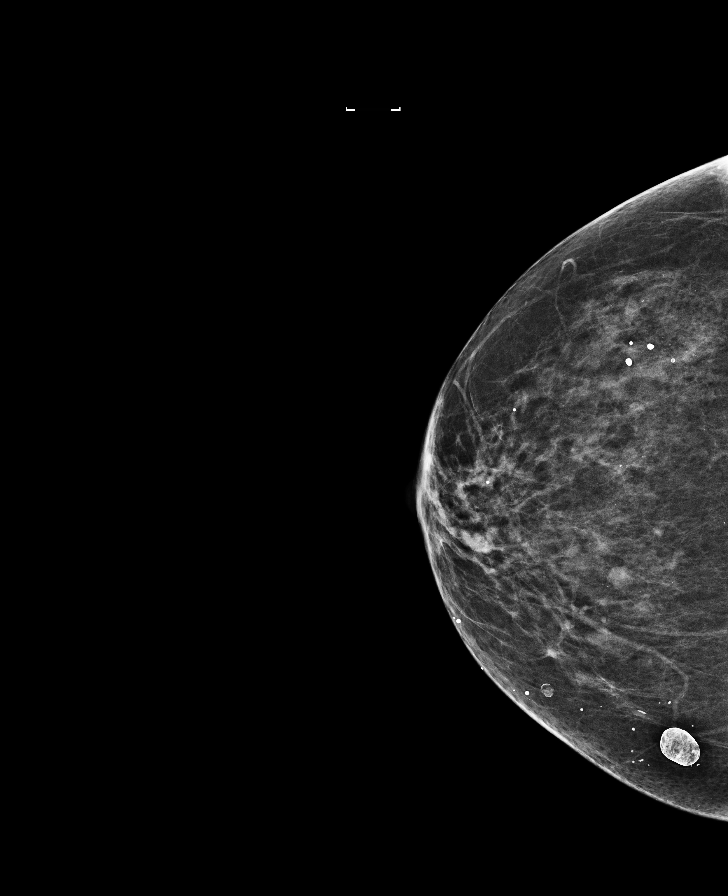

[L CC synth-2D]
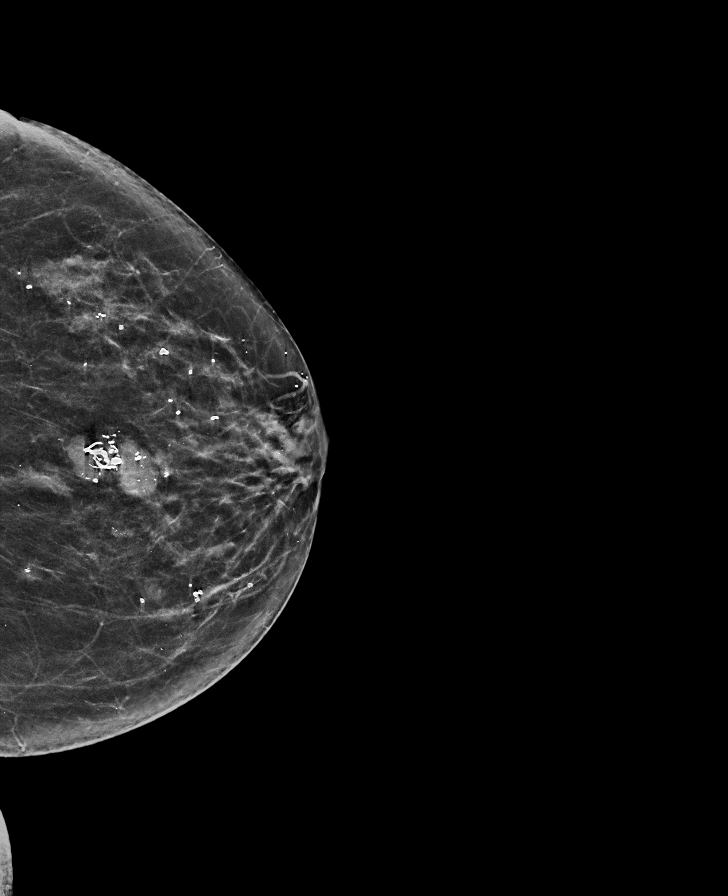

[R MLO]
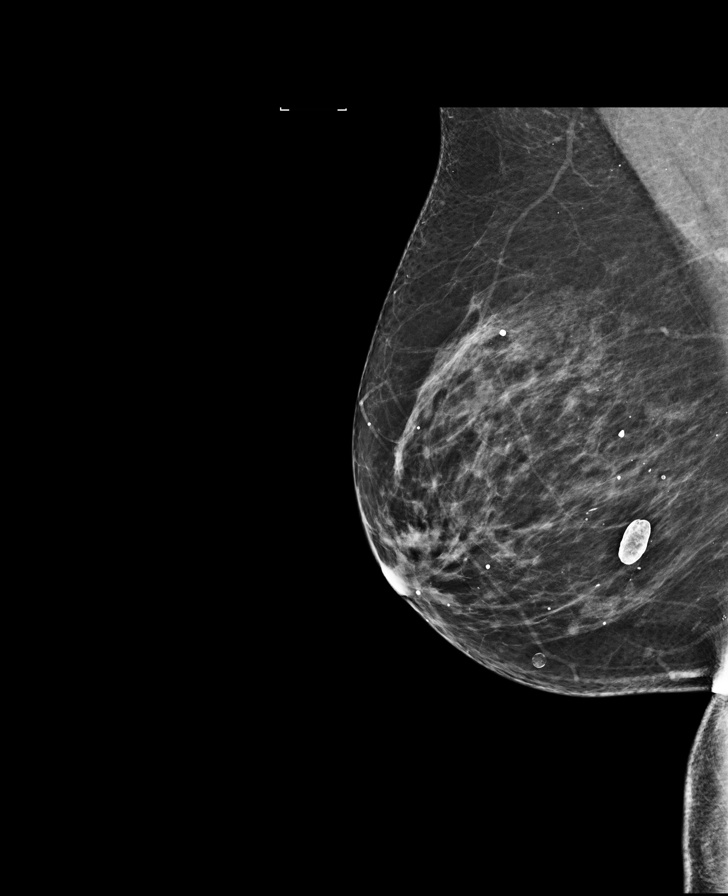

[R MLO synth-2D]
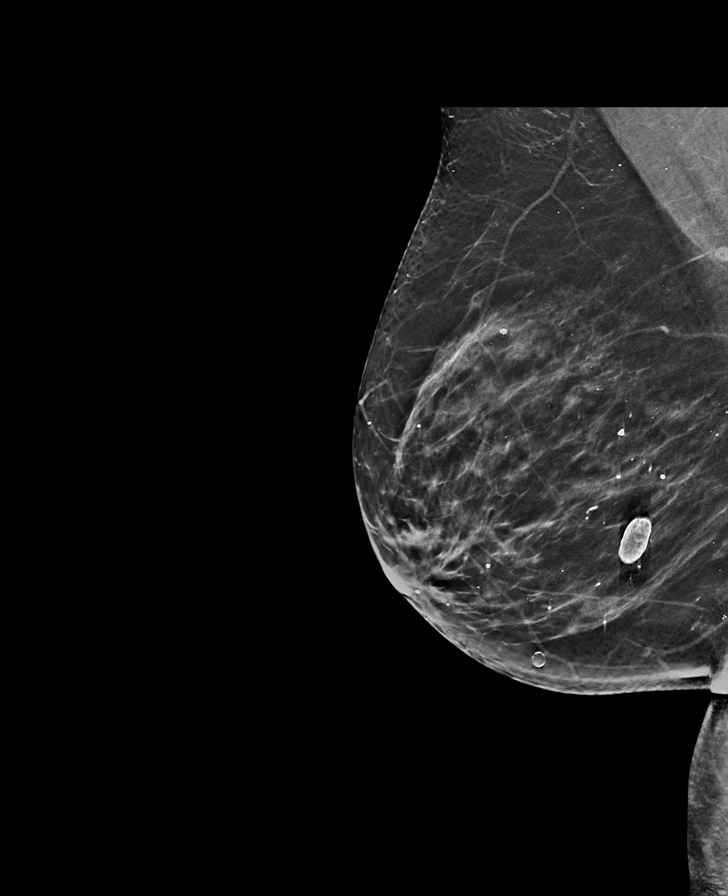

[L CC]
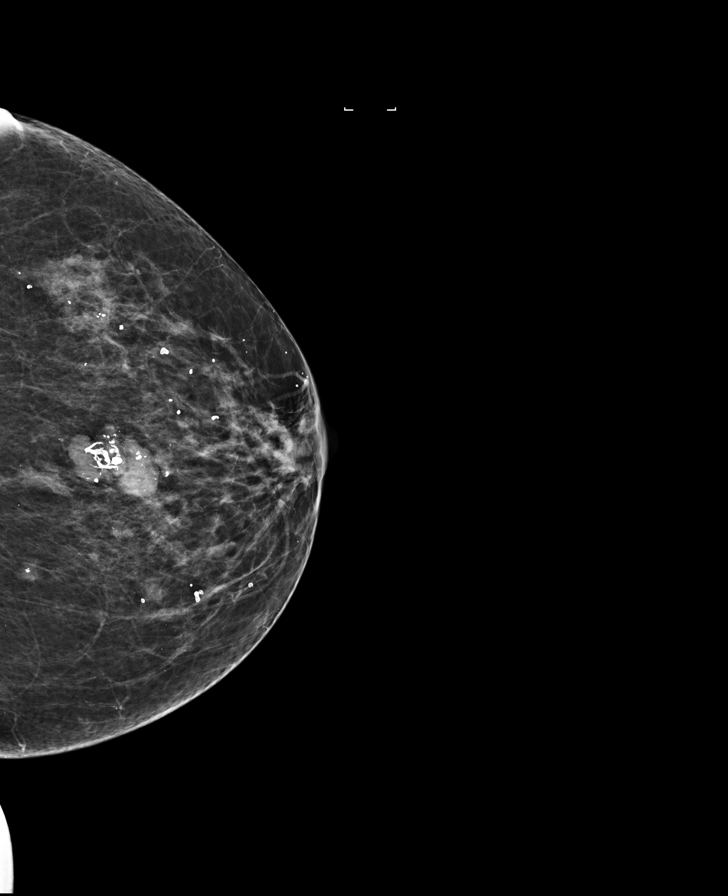

[L MLO]
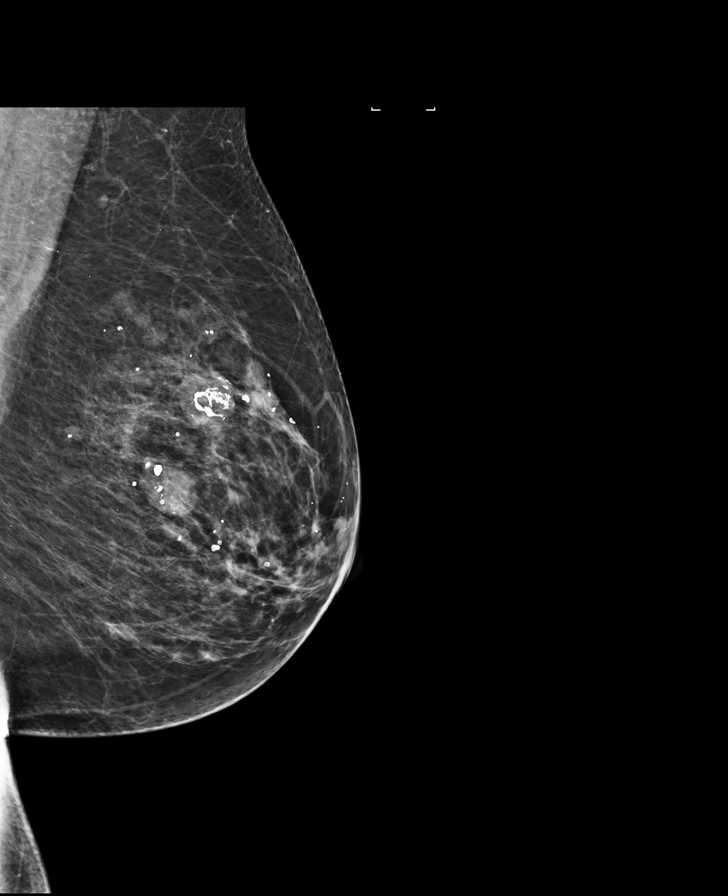

[R CC synth-2D]
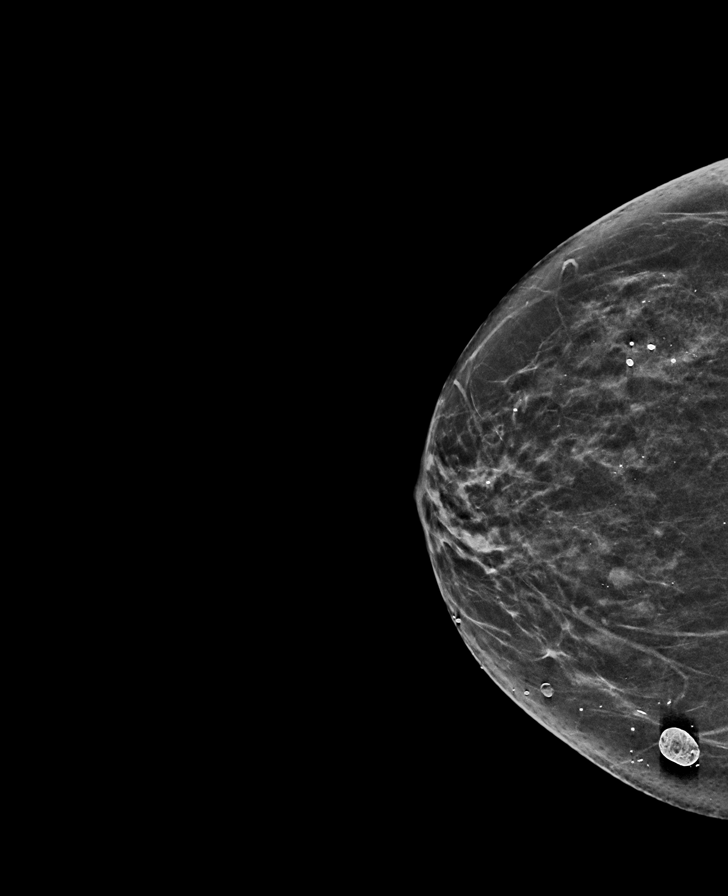

[8 of 28 positions shown; findings below may reference images not displayed]

ACR Breast Density Category c: The breast tissue is heterogeneously
dense, which may obscure small masses.
FINDINGS: There are no findings suspicious for malignancy. Images were
processed with CAD.
IMPRESSION: No mammographic evidence of malignancy. A result letter of this
screening mammogram will be mailed directly to the patient.

RECOMMENDATION:
Screening mammogram in one year. (Code:[SM])

BI-RADS CATEGORY  1: Negative.

## 2015-02-03 ENCOUNTER — Other Ambulatory Visit: Payer: Self-pay

## 2015-02-03 DIAGNOSIS — F251 Schizoaffective disorder, depressive type: Secondary | ICD-10-CM | POA: Insufficient documentation

## 2015-02-03 DIAGNOSIS — I10 Essential (primary) hypertension: Secondary | ICD-10-CM | POA: Insufficient documentation

## 2015-02-03 DIAGNOSIS — E041 Nontoxic single thyroid nodule: Secondary | ICD-10-CM | POA: Insufficient documentation

## 2015-02-03 DIAGNOSIS — F419 Anxiety disorder, unspecified: Secondary | ICD-10-CM | POA: Insufficient documentation

## 2015-02-03 DIAGNOSIS — F329 Major depressive disorder, single episode, unspecified: Secondary | ICD-10-CM

## 2015-02-03 DIAGNOSIS — G4700 Insomnia, unspecified: Secondary | ICD-10-CM | POA: Insufficient documentation

## 2015-02-03 DIAGNOSIS — E785 Hyperlipidemia, unspecified: Secondary | ICD-10-CM

## 2015-02-03 DIAGNOSIS — S43006A Unspecified dislocation of unspecified shoulder joint, initial encounter: Secondary | ICD-10-CM | POA: Insufficient documentation

## 2015-02-03 DIAGNOSIS — K219 Gastro-esophageal reflux disease without esophagitis: Secondary | ICD-10-CM | POA: Insufficient documentation

## 2015-02-03 MED ORDER — SIMVASTATIN 20 MG PO TABS: 20.0000 mg | ORAL_TABLET | Freq: Every day | ORAL | Status: DC

## 2015-02-03 NOTE — Telephone Encounter (Signed)
Last OV 03/2014  Thanks,   -Laura  

## 2015-02-09 ENCOUNTER — Other Ambulatory Visit: Payer: Self-pay | Admitting: Otolaryngology

## 2015-02-09 DIAGNOSIS — E041 Nontoxic single thyroid nodule: Secondary | ICD-10-CM

## 2015-02-11 ENCOUNTER — Ambulatory Visit
Admission: RE | Admit: 2015-02-11 | Discharge: 2015-02-11 | Disposition: A | Discharge status: Home / Self Care | Payer: PPO | Source: Ambulatory Visit | Attending: Otolaryngology | Admitting: Otolaryngology

## 2015-02-11 DIAGNOSIS — E041 Nontoxic single thyroid nodule: Secondary | ICD-10-CM | POA: Insufficient documentation

## 2015-04-27 ENCOUNTER — Encounter: Payer: Self-pay | Admitting: Family Medicine

## 2015-05-20 ENCOUNTER — Encounter: Payer: Self-pay | Admitting: Family Medicine

## 2015-06-23 ENCOUNTER — Encounter: Payer: Self-pay | Admitting: Family Medicine

## 2015-07-06 ENCOUNTER — Ambulatory Visit (INDEPENDENT_AMBULATORY_CARE_PROVIDER_SITE_OTHER): Payer: PPO | Admitting: Family Medicine

## 2015-07-06 ENCOUNTER — Encounter: Payer: Self-pay | Admitting: Family Medicine

## 2015-07-06 ENCOUNTER — Encounter: Payer: Self-pay | Admitting: *Deleted

## 2015-07-06 VITALS — BP 106/68 | HR 64 | Temp 97.4°F | Resp 16 | Ht 65.5 in | Wt 178.0 lb

## 2015-07-06 DIAGNOSIS — Z0000 Encounter for general adult medical examination without abnormal findings: Secondary | ICD-10-CM

## 2015-07-06 DIAGNOSIS — E785 Hyperlipidemia, unspecified: Secondary | ICD-10-CM

## 2015-07-06 DIAGNOSIS — I10 Essential (primary) hypertension: Secondary | ICD-10-CM

## 2015-07-06 DIAGNOSIS — Z87448 Personal history of other diseases of urinary system: Secondary | ICD-10-CM | POA: Diagnosis not present

## 2015-07-06 DIAGNOSIS — Z1211 Encounter for screening for malignant neoplasm of colon: Secondary | ICD-10-CM | POA: Diagnosis not present

## 2015-07-06 DIAGNOSIS — Z1239 Encounter for other screening for malignant neoplasm of breast: Secondary | ICD-10-CM

## 2015-07-06 DIAGNOSIS — Z23 Encounter for immunization: Secondary | ICD-10-CM

## 2015-07-06 DIAGNOSIS — Z87898 Personal history of other specified conditions: Secondary | ICD-10-CM | POA: Insufficient documentation

## 2015-07-06 MED ORDER — OXYBUTYNIN CHLORIDE 5 MG PO TABS: 5.0000 mg | ORAL_TABLET | Freq: Every evening | ORAL | Status: DC

## 2015-07-06 NOTE — Progress Notes (Signed)
Patient ID: Jessica Little, female   DOB: 1945-10-03, 70 y.o.   MRN: [id number]       Patient: Jessica Little, Female    DOB: 22-Oct-1945, 70 y.o.   MRN: [id number] Visit Date: 07/06/2015  Today's Provider: Lorie Phenix, MD   Chief Complaint  Patient presents with  . Medicare Wellness   Subjective:    Annual wellness visit [name] is a 70 y.o. female. She feels well. She reports exercising 2-3 times a week. She reports she is sleeping well. 03/29/14 CPE 05/13/14 Mammo-BI-RADS 1 01/01/06 Colon-WNL, recheck in 10 yrs 05/13/14 BMD-osteopenia  -----------------------------------------------------------   Review of Systems  Constitutional: Negative.   HENT: Positive for rhinorrhea.   Eyes: Negative.   Respiratory: Negative.   Cardiovascular: Negative.   Gastrointestinal: Positive for vomiting.  Endocrine: Negative.   Genitourinary: Negative.   Musculoskeletal: Negative.   Skin: Negative.   Allergic/Immunologic: Negative.   Neurological: Negative.   Hematological: Negative.   Psychiatric/Behavioral: The patient is nervous/anxious.     Social History   Social History  . Marital Status: Widowed    Spouse Name: N/A  . Number of Children: N/A  . Years of Education: College   Occupational History  . Retired    Social History Main Topics  . Smoking status: Never Smoker   . Smokeless tobacco: Never Used  . Alcohol Use: No  . Drug Use: No  . Sexual Activity: Not on file   Other Topics Concern  . Not on file   Social History Narrative    Past Medical History  Diagnosis Date  . Hyperlipidemia   . Hypertension   . Anxiety   . GERD (gastroesophageal reflux disease)      Patient Active Problem List   Diagnosis Date Noted  . H/O urinary frequency 07/06/2015  . Anxiety 02/03/2015  . Clinical depression 02/03/2015  . Acid reflux 02/03/2015  . HLD (hyperlipidemia) 02/03/2015  . BP (high blood pressure) 02/03/2015  . Cannot sleep 02/03/2015  .  Dislocated shoulder 02/03/2015  . Thyroid nodule 02/03/2015    Past Surgical History  Procedure Laterality Date  . Cholecystectomy      Her family history includes Arrhythmia in her mother; Healthy in her brother, sister, and sister; Heart attack in her father; Stroke in her father.    Previous Medications   ASPIRIN 81 MG TABLET    Take 81 mg by mouth daily.    ATENOLOL (TENORMIN) 25 MG TABLET    Take 25 mg by mouth 2 (two) times daily.    CALCIUM CARBONATE-VIT D-MIN PO    Take by mouth.   LORAZEPAM (ATIVAN) 1 MG TABLET    Take 1 mg by mouth every 6 (six) hours as needed.    MELATONIN 5 MG CAPS    Take by mouth.   NORTRIPTYLINE (PAMELOR) 50 MG CAPSULE    Take 50 mg by mouth 2 (two) times daily.    OMEGA-3 FATTY ACIDS PO    Take by mouth.   OMEPRAZOLE (PRILOSEC) 40 MG CAPSULE    Take 40 mg by mouth daily.    OXYBUTYNIN (DITROPAN) 5 MG TABLET    Take 5 mg by mouth.    PERPHENAZINE (TRILAFON) 8 MG TABLET    Take 8 mg by mouth at bedtime.    SIMVASTATIN (ZOCOR) 20 MG TABLET    Take 1 tablet (20 mg total) by mouth daily.   VITAMIN E 1000 UNIT CAPSULE    Take by mouth.  Patient Care Team: Lorie Phenix, MD as PCP - General (Family Medicine)     Objective:   Vitals: BP 106/68 mmHg  Pulse 64  Temp(Src) 97.4 F (36.3 C) (Oral)  Resp 16  Ht 5' 5.5" (1.664 m)  Wt 178 lb (80.74 kg)  BMI 29.16 kg/m2  SpO2 97%  Physical Exam  Constitutional: She is oriented to person, place, and time. She appears well-developed and well-nourished.  HENT:  Head: Normocephalic and atraumatic.  Right Ear: Tympanic membrane, external ear and ear canal normal.  Left Ear: Tympanic membrane, external ear and ear canal normal.  Nose: Nose normal.  Mouth/Throat: Uvula is midline, oropharynx is clear and moist and mucous membranes are normal.  Eyes: Conjunctivae, EOM and lids are normal. Pupils are equal, round, and reactive to light.  Neck: Trachea normal and normal range of motion. Neck supple. Carotid  bruit is not present. No thyroid mass and no thyromegaly present.  Cardiovascular: Normal rate, regular rhythm and normal heart sounds.   Pulmonary/Chest: Effort normal and breath sounds normal.  Abdominal: Soft. Normal appearance and bowel sounds are normal. There is no hepatosplenomegaly. There is no tenderness.  Genitourinary: No breast swelling, tenderness or discharge.  Musculoskeletal: Normal range of motion.  Lymphadenopathy:    She has no cervical adenopathy.    She has no axillary adenopathy.  Neurological: She is alert and oriented to person, place, and time. She has normal strength. No cranial nerve deficit.  Skin: Skin is warm, dry and intact.  Psychiatric: She has a normal mood and affect. Her speech is normal and behavior is normal. Judgment and thought content normal. Cognition and memory are normal.    Activities of Daily Living In your present state of health, do you have any difficulty performing the following activities: 07/06/2015  Hearing? N  Vision? N  Difficulty concentrating or making decisions? N  Walking or climbing stairs? N  Dressing or bathing? N  Doing errands, shopping? N    Fall Risk Assessment Fall Risk  07/06/2015  Falls in the past year? No     Depression Screen PHQ 2/9 Scores 07/06/2015  PHQ - 2 Score 0    Cognitive Testing - 6-CIT  Correct? Score   What year is it? yes 0 0 or 4  What month is it? yes 0 0 or 3  Memorize:    Floyde Parkins,  44,  [address]  [place],      What time is it? (within 1 hour) yes 0 0 or 3  Count backwards from 20 yes 0 0, 2, or 4  Name the months of the year yes 0 0, 2, or 4  Repeat name & address above yes 0 0, 2, 4, 6, 8, or 10       TOTAL SCORE  0/28   Interpretation:  Normal  Normal (0-7) Abnormal (8-28)       Assessment & Plan:     Annual Wellness Visit  Reviewed patient's Family Medical History Reviewed and updated list of patient's medical providers Assessment of cognitive impairment was  done Assessed patient's functional ability Established a written schedule for health screening services Health Risk Assessent Completed and Reviewed  Exercise Activities and Dietary recommendations Goals    None         1. Medicare annual wellness visit, subsequent Stable. Patient advised to continue eating healthy and exercise daily.  2. Essential hypertension - CBC with Differential/Platelet - Comprehensive metabolic panel - TSH  3. HLD (hyperlipidemia) -  Lipid Panel With LDL/HDL Ratio  4. H/O urinary frequency - oxybutynin (DITROPAN) 5 MG tablet; Take 1 tablet (5 mg total) by mouth every evening.  Dispense: 30 tablet; Refill: 5  5. Breast cancer screening - MM DIGITAL SCREENING BILATERAL; Future  6. Colon cancer screening - Ambulatory referral to Gastroenterology  7. Need for pneumococcal vaccination GIven today.     Patient seen and examined by Dr. Leo Grosser, and note scribed by Liz Beach. Dimas, CMA.  I have reviewed the document for accuracy and completeness and I agree with above. Leo Grosser, MD   Lorie Phenix, MD    ------------------------------------------------------------------------------------------------------------

## 2015-07-08 DIAGNOSIS — I10 Essential (primary) hypertension: Secondary | ICD-10-CM | POA: Diagnosis not present

## 2015-07-08 DIAGNOSIS — E785 Hyperlipidemia, unspecified: Secondary | ICD-10-CM | POA: Diagnosis not present

## 2015-07-09 LAB — LIPID PANEL WITH LDL/HDL RATIO
Cholesterol, Total: 192 mg/dL (ref 100–199)
HDL: 46 mg/dL (ref >39)
LDL CALC: 118 mg/dL — AB (ref 0–99)
LDL/HDL RATIO: 2.6 ratio (ref 0.0–3.2)
Triglycerides: 138 mg/dL (ref 0–149)
VLDL CHOLESTEROL CAL: 28 mg/dL (ref 5–40)

## 2015-07-09 LAB — CBC WITH DIFFERENTIAL/PLATELET
BASOS ABS: 0 10*3/uL (ref 0.0–0.2)
Basos: 0 %
EOS (ABSOLUTE): 0.1 10*3/uL (ref 0.0–0.4)
Eos: 1 %
Hematocrit: 36.8 % (ref 34.0–46.6)
Hemoglobin: 12.4 g/dL (ref 11.1–15.9)
Immature Grans (Abs): 0 10*3/uL (ref 0.0–0.1)
Immature Granulocytes: 0 %
LYMPHS ABS: 1.8 10*3/uL (ref 0.7–3.1)
Lymphs: 18 %
MCH: 26.7 pg (ref 26.6–33.0)
MCHC: 33.7 g/dL (ref 31.5–35.7)
MCV: 79 fL (ref 79–97)
MONOS ABS: 0.6 10*3/uL (ref 0.1–0.9)
Monocytes: 6 %
NEUTROS ABS: 7.2 10*3/uL — AB (ref 1.4–7.0)
Neutrophils: 75 %
PLATELETS: 271 10*3/uL (ref 150–379)
RBC: 4.64 x10E6/uL (ref 3.77–5.28)
RDW: 15.7 % — AB (ref 12.3–15.4)
WBC: 9.7 10*3/uL (ref 3.4–10.8)

## 2015-07-09 LAB — COMPREHENSIVE METABOLIC PANEL
ALBUMIN: 4.1 g/dL (ref 3.6–4.8)
ALT: 12 IU/L (ref 0–32)
AST: 13 IU/L (ref 0–40)
Albumin/Globulin Ratio: 1.5 (ref 1.1–2.5)
Alkaline Phosphatase: 73 IU/L (ref 39–117)
BUN / CREAT RATIO: 10 — AB (ref 11–26)
BUN: 12 mg/dL (ref 8–27)
Bilirubin Total: 0.4 mg/dL (ref 0.0–1.2)
CALCIUM: 9 mg/dL (ref 8.7–10.3)
CO2: 25 mmol/L (ref 18–29)
CREATININE: 1.18 mg/dL — AB (ref 0.57–1.00)
Chloride: 100 mmol/L (ref 96–106)
GFR calc Af Amer: 55 mL/min/{1.73_m2} — ABNORMAL LOW (ref >59)
GFR, EST NON AFRICAN AMERICAN: 48 mL/min/{1.73_m2} — AB (ref >59)
GLOBULIN, TOTAL: 2.7 g/dL (ref 1.5–4.5)
GLUCOSE: 110 mg/dL — AB (ref 65–99)
Potassium: 3.8 mmol/L (ref 3.5–5.2)
Sodium: 144 mmol/L (ref 134–144)
TOTAL PROTEIN: 6.8 g/dL (ref 6.0–8.5)

## 2015-07-09 LAB — TSH: TSH: 2.68 u[IU]/mL (ref 0.450–4.500)

## 2015-07-11 ENCOUNTER — Encounter: Payer: Self-pay | Admitting: General Surgery

## 2015-07-11 ENCOUNTER — Telehealth: Payer: Self-pay

## 2015-07-11 NOTE — Telephone Encounter (Signed)
Pt advised.   Thanks,   -Nylene Inlow  

## 2015-07-11 NOTE — Telephone Encounter (Signed)
-----   Message from Lorie Phenix, MD sent at 07/09/2015  4:59 PM EST ----- Labs stable. Please notify patient. Thanks.

## 2015-07-12 ENCOUNTER — Other Ambulatory Visit: Payer: Self-pay | Admitting: General Surgery

## 2015-07-12 ENCOUNTER — Ambulatory Visit (INDEPENDENT_AMBULATORY_CARE_PROVIDER_SITE_OTHER): Payer: PPO | Admitting: General Surgery

## 2015-07-12 ENCOUNTER — Encounter: Payer: Self-pay | Admitting: General Surgery

## 2015-07-12 VITALS — BP 132/80 | HR 78 | Resp 12 | Ht 67.0 in | Wt 180.0 lb

## 2015-07-12 DIAGNOSIS — Z1211 Encounter for screening for malignant neoplasm of colon: Secondary | ICD-10-CM

## 2015-07-12 MED ORDER — POLYETHYLENE GLYCOL 3350 17 GM/SCOOP PO POWD: 1.0000 | Freq: Once | ORAL | Status: DC

## 2015-07-12 NOTE — Progress Notes (Addendum)
Patient ID: Jessica Little, female   DOB: December 27, 1945, 70 y.o.   MRN: [id number]  Chief Complaint  Patient presents with  . Other    screening colonoscopy    HPI Jessica Little is a 70 y.o. female here today for a evaluation of a screening colonoscopy. Last one was done in 01/01/2006. Patient states no GI problems at this time. Moves her bowels every three days.  I personally reviewed the patient's history. HPI  Past Medical History  Diagnosis Date  . Hyperlipidemia   . Hypertension   . Anxiety   . GERD (gastroesophageal reflux disease)     Past Surgical History  Procedure Laterality Date  . Cholecystectomy    . Colonoscopy  01/01/2006    Family History  Problem Relation Age of Onset  . Arrhythmia Mother   . Heart attack Father   . Stroke Father   . Healthy Sister   . Healthy Brother   . Healthy Sister     Social History Social History  Substance Use Topics  . Smoking status: Never Smoker   . Smokeless tobacco: Never Used  . Alcohol Use: No    No Known Allergies  Current Outpatient Prescriptions  Medication Sig Dispense Refill  . aspirin 81 MG tablet Take 81 mg by mouth daily.     Marland Kitchen atenolol (TENORMIN) 25 MG tablet Take 25 mg by mouth 2 (two) times daily.     . simvastatin (ZOCOR) 20 MG tablet Take 1 tablet (20 mg total) by mouth daily. 90 tablet 3  . CALCIUM CARBONATE-VIT D-MIN PO Take by mouth.    Marland Kitchen LORazepam (ATIVAN) 1 MG tablet Take 1 mg by mouth every 6 (six) hours as needed.     . Melatonin 5 MG CAPS Take by mouth.    . nortriptyline (PAMELOR) 50 MG capsule Take 50 mg by mouth 2 (two) times daily.     . OMEGA-3 FATTY ACIDS PO Take by mouth.    Marland Kitchen omeprazole (PRILOSEC) 40 MG capsule Take 40 mg by mouth daily.     Marland Kitchen oxybutynin (DITROPAN) 5 MG tablet Take 1 tablet (5 mg total) by mouth every evening. 30 tablet 5  . perphenazine (TRILAFON) 8 MG tablet Take 8 mg by mouth at bedtime.     . polyethylene glycol powder (GLYCOLAX/MIRALAX) powder Take 255 g by mouth  once. 255 grams one bottle for colonoscopy prep 255 g 0  . vitamin E 1000 UNIT capsule Take by mouth.     No current facility-administered medications for this visit.    Review of Systems Review of Systems  Constitutional: Negative.   Respiratory: Negative.   Cardiovascular: Negative.     Blood pressure 132/80, pulse 78, resp. rate 12, height  (1.702 m), weight 180 lb (81.647 kg).  Physical Exam Physical Exam  Constitutional: She is oriented to person, place, and time. She appears well-developed and well-nourished.  Eyes: Conjunctivae are normal. No scleral icterus.  Neck: Neck supple.  Cardiovascular: Normal rate, regular rhythm and normal heart sounds.   Pulmonary/Chest: Effort normal and breath sounds normal.  Lymphadenopathy:    She has no cervical adenopathy.  Neurological: She is alert and oriented to person, place, and time.  Skin: Skin is warm and dry.    Data Reviewed Laboratory studies of 07/08/2015 reviewed. Normal CBC, depressed estimated GFR at 48 mL/m  Colonoscopy dated 01/02/2016 completed for screening was negative.  Assessment    Candidate for screening colonoscopy.    Plan  Colonoscopy with possible biopsy/polypectomy prn: Information regarding the procedure, including its potential risks and complications (including but not limited to perforation of the bowel, which may require emergency surgery to repair, and bleeding) was verbally given to the patient. Educational information regarding lower intestinal endoscopy was given to the patient. Written instructions for how to complete the bowel prep using Miralax were provided. The importance of drinking ample fluids to avoid dehydration as a result of the prep emphasized. Scheduled colonoscopy for 08/23/15.  Patient has been given instructions PCP:  Lorie Phenix  This information has been scribed by Ples Specter CMA.    Earline Mayotte 07/21/2015, 5:41 PM

## 2015-07-12 NOTE — Patient Instructions (Signed)
Colonoscopy A colonoscopy is an exam to look at the entire large intestine (colon). This exam can help find problems such as tumors, polyps, inflammation, and areas of bleeding. The exam takes about 1 hour.  LET YOUR HEALTH CARE PROVIDER KNOW ABOUT:   Any allergies you have.  All medicines you are taking, including vitamins, herbs, eye drops, creams, and over-the-counter medicines.  Previous problems you or members of your family have had with the use of anesthetics.  Any blood disorders you have.  Previous surgeries you have had.  Medical conditions you have. RISKS AND COMPLICATIONS  Generally, this is a safe procedure. However, as with any procedure, complications can occur. Possible complications include:  Bleeding.  Tearing or rupture of the colon wall.  Reaction to medicines given during the exam.  Infection (rare). BEFORE THE PROCEDURE   Ask your health care provider about changing or stopping your regular medicines.  You may be prescribed an oral bowel prep. This involves drinking a large amount of medicated liquid, starting the day before your procedure. The liquid will cause you to have multiple loose stools until your stool is almost clear or light green. This cleans out your colon in preparation for the procedure.  Do not eat or drink anything else once you have started the bowel prep, unless your health care provider tells you it is safe to do so.  Arrange for someone to drive you home after the procedure. PROCEDURE   You will be given medicine to help you relax (sedative).  You will lie on your side with your knees bent.  A long, flexible tube with a light and camera on the end (colonoscope) will be inserted through the rectum and into the colon. The camera sends video back to a computer screen as it moves through the colon. The colonoscope also releases carbon dioxide gas to inflate the colon. This helps your health care provider see the area better.  During  the exam, your health care provider may take a small tissue sample (biopsy) to be examined under a microscope if any abnormalities are found.  The exam is finished when the entire colon has been viewed. AFTER THE PROCEDURE   Do not drive for 24 hours after the exam.  You may have a small amount of blood in your stool.  You may pass moderate amounts of gas and have mild abdominal cramping or bloating. This is caused by the gas used to inflate your colon during the exam.  Ask when your test results will be ready and how you will get your results. Make sure you get your test results.   This information is not intended to replace advice given to you by your health care provider. Make sure you discuss any questions you have with your health care provider.   Document Released: 05/11/2000 Document Revised: 03/04/2013 Document Reviewed: 01/19/2013 Elsevier Interactive Patient Education 2016 Elsevier Inc.  

## 2015-07-22 ENCOUNTER — Ambulatory Visit (INDEPENDENT_AMBULATORY_CARE_PROVIDER_SITE_OTHER): Payer: PPO | Admitting: Family Medicine

## 2015-07-22 ENCOUNTER — Encounter: Payer: Self-pay | Admitting: Family Medicine

## 2015-07-22 ENCOUNTER — Ambulatory Visit
Admission: RE | Admit: 2015-07-22 | Discharge: 2015-07-22 | Disposition: A | Discharge status: Home / Self Care | Payer: PPO | Source: Ambulatory Visit | Attending: Family Medicine | Admitting: Family Medicine

## 2015-07-22 VITALS — BP 118/64 | HR 68 | Temp 98.5°F | Resp 24 | Wt 174.0 lb

## 2015-07-22 DIAGNOSIS — J40 Bronchitis, not specified as acute or chronic: Secondary | ICD-10-CM

## 2015-07-22 DIAGNOSIS — R05 Cough: Secondary | ICD-10-CM | POA: Diagnosis not present

## 2015-07-22 DIAGNOSIS — K449 Diaphragmatic hernia without obstruction or gangrene: Secondary | ICD-10-CM | POA: Diagnosis not present

## 2015-07-22 IMAGING — CR DG CHEST 2V
1 series · 2 of 2 positions shown · non-contrast
Comparison: None

CLINICAL DATA: Bronchitis, nonproductive cough and wheezing since
[REDACTED], history of bronchitis, hypertension, hyperlipidemia, GERD

EXAM:
CHEST  2 VIEW

[Series 1: dg chest 2 view · 0.14mm/px · 2 of 2 slices shown]
[im 1/2]
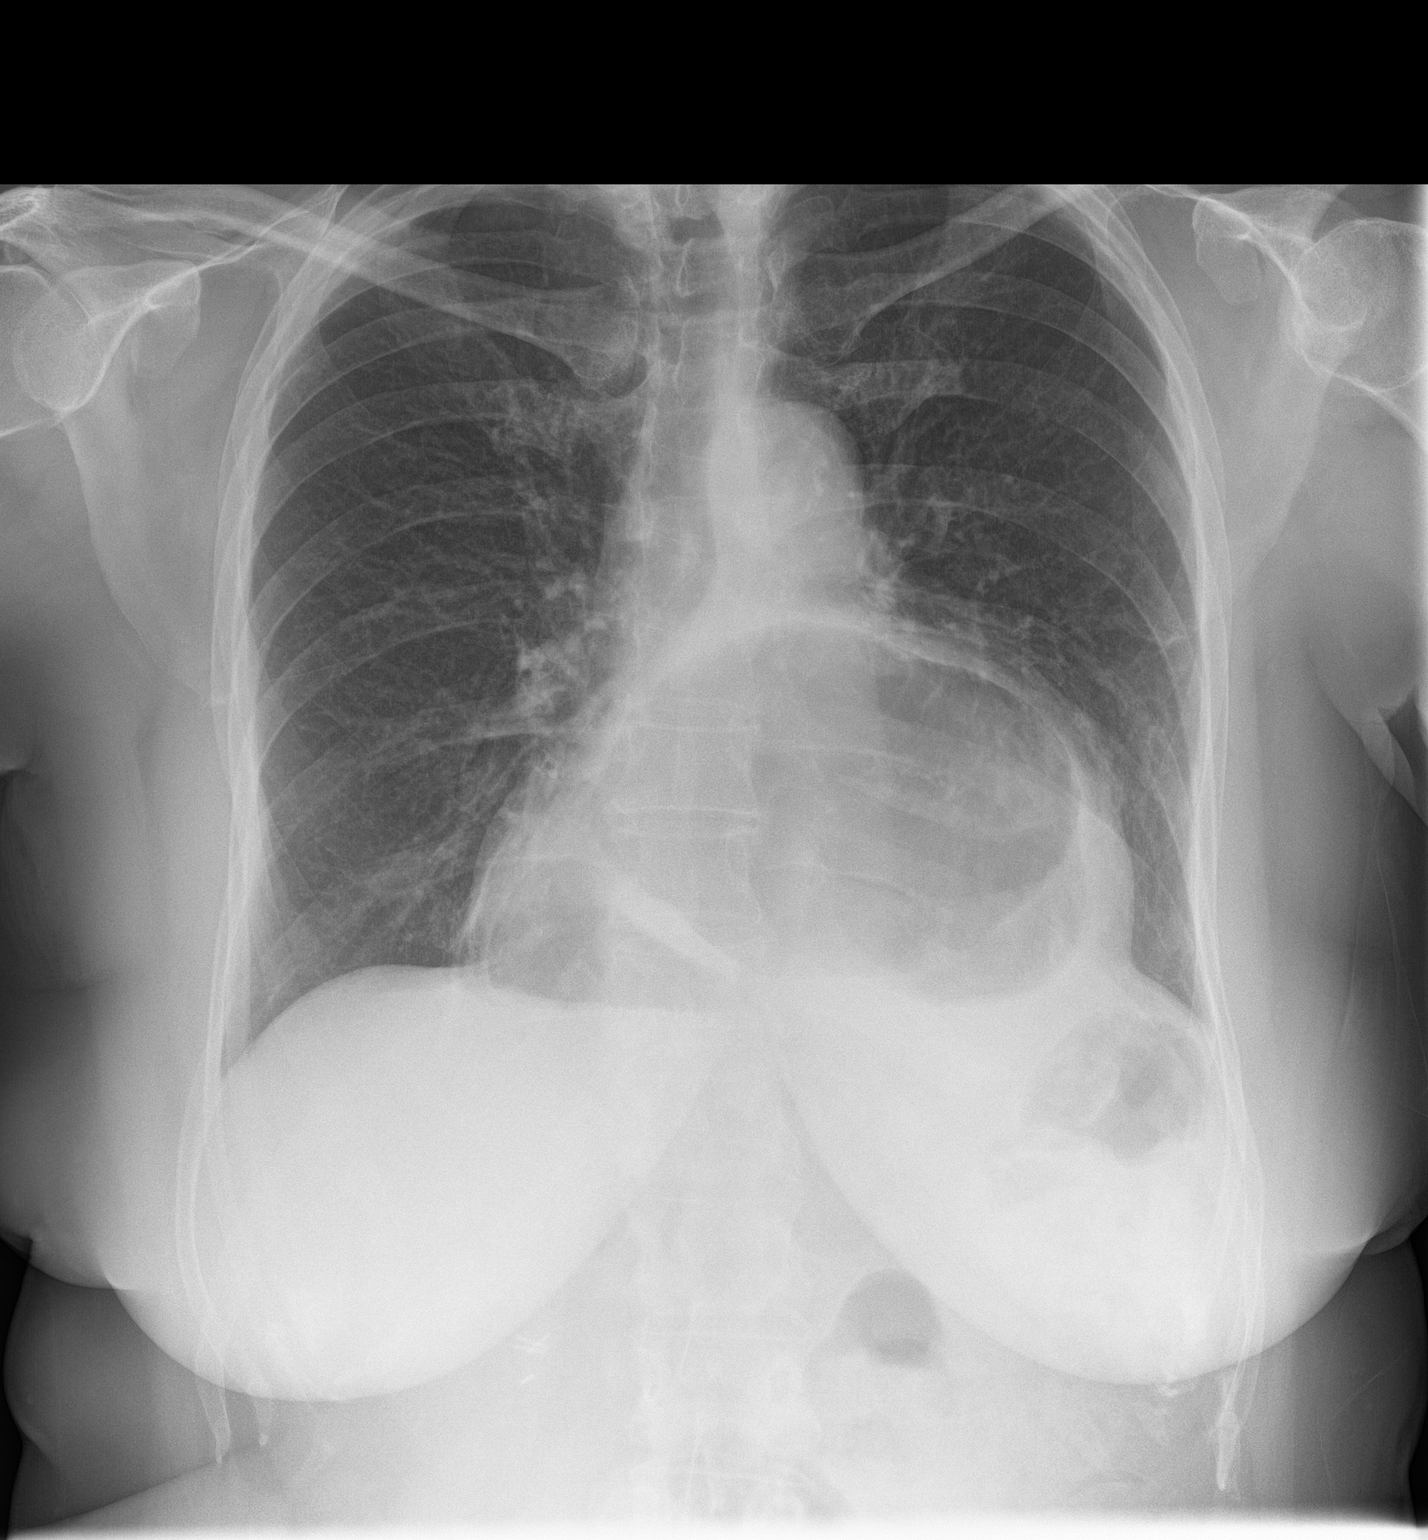
[im 2/2]
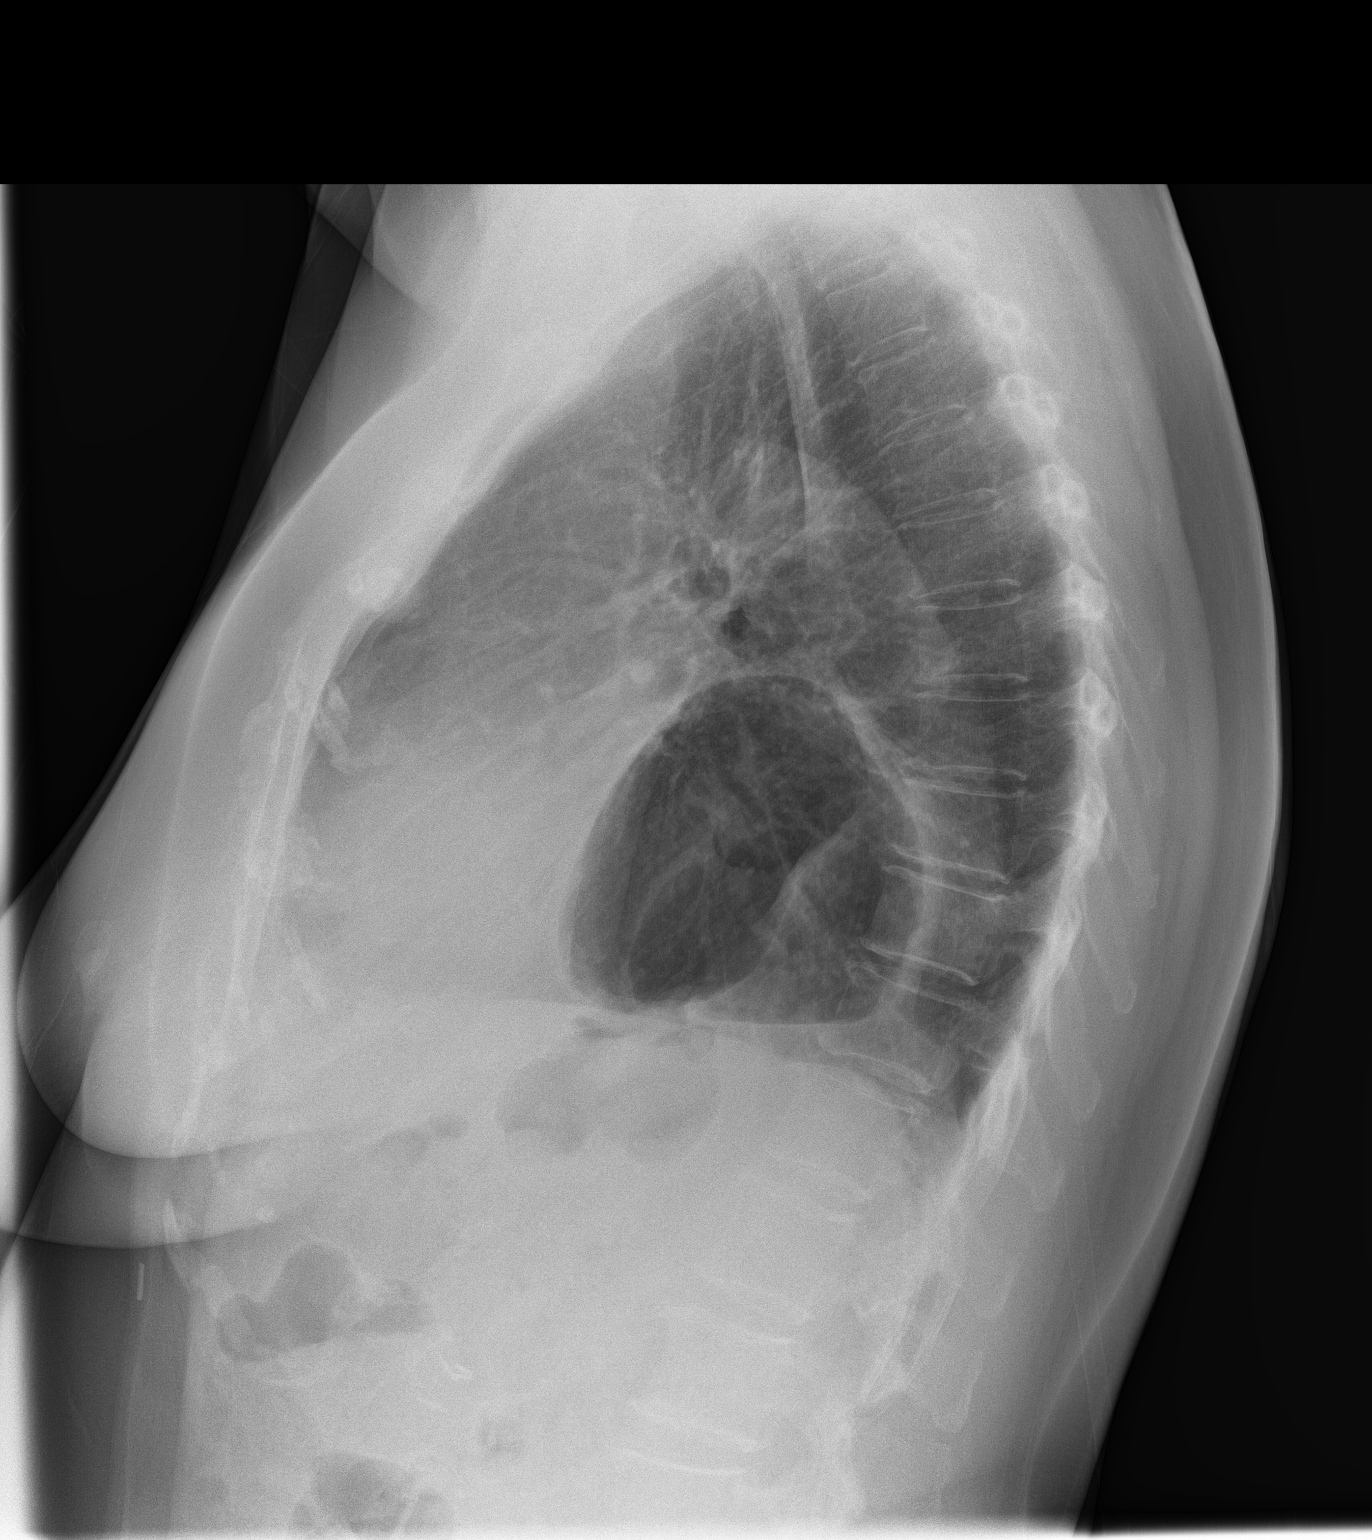

[2 of 2 positions shown; findings below may reference images not displayed]

FINDINGS: Normal heart size and pulmonary vascularity.

Atherosclerotic calcification aorta.

Very large hiatal hernia.

Lungs clear.

No pleural effusion or pneumothorax.

Bones unremarkable.
IMPRESSION: Very large hiatal hernia.

No acute abnormalities.

## 2015-07-22 MED ORDER — DOXYCYCLINE HYCLATE 100 MG PO TABS: 100.0000 mg | ORAL_TABLET | Freq: Two times a day (BID) | ORAL | Status: DC

## 2015-07-22 NOTE — Progress Notes (Signed)
Subjective:     Patient ID: Jessica Little, female   DOB: 04-18-1946, 70 y.o.   MRN: [id number]  HPI  Chief Complaint  Patient presents with  . Cough    X 4 days. Patient reports that she has a hacking cough. She has felt feverish. Denies sore throat or ear fullness. She does mention she has a mild wheeze. She reports that she has been taking prescription cough syrup that was prescribed last year that helped a litte. Patient has had pneumonia vaccine, but not flu vaccine.   Denies cold sx or specific shortness of breath: "I have felt wobbly so had someone drive me here today." No hx of asthma or smoking.   Review of Systems     Objective:   Physical Exam  Constitutional: She appears well-developed and well-nourished. No distress.  Ears: T.M's intact without inflammation Throat: no tonsillar enlargement or exudate Neck: no cervical adenopathy Lungs: diminished breath sound with bilateral posterior expiratory wheezing/crackles. Increased respiratory rate    Assessment:    1. Bronchitis: evaluate for pneumonia - DG Chest 2 View; Future - doxycycline (VIBRA-TABS) 100 MG tablet; Take 1 tablet (100 mg total) by mouth 2 (two) times daily.  Dispense: 20 tablet; Refill: 0    Plan:    Further f/u pending CXR.

## 2015-07-22 NOTE — Patient Instructions (Signed)
We will call you with the x-ray report 

## 2015-07-23 ENCOUNTER — Telehealth: Payer: Self-pay

## 2015-07-23 NOTE — Telephone Encounter (Signed)
-----   Message from Anola Gurney, [place] sent at 07/22/2015  3:04 PM EST ----- No pneumonia. Continue antibiotic for bronchitis

## 2015-07-23 NOTE — Telephone Encounter (Signed)
Patient was advised. KW 

## 2015-08-05 ENCOUNTER — Telehealth: Payer: Self-pay | Admitting: Family Medicine

## 2015-08-05 NOTE — Telephone Encounter (Signed)
Patient states that she will come into office tomorrow morning.KW

## 2015-08-05 NOTE — Telephone Encounter (Signed)
Would like her to be reevaluated either in the office tomorrow morning or with Dr. Elease HashimotoMaloney next week.

## 2015-08-05 NOTE — Telephone Encounter (Signed)
[facility] [address]

## 2015-08-05 NOTE — Telephone Encounter (Signed)
Patient was last seen 2 weeks ago please advise. KW

## 2015-08-05 NOTE — Telephone Encounter (Signed)
Pt stated she has been taking the doxycycline (VIBRA-TABS) 100 MG tablet as directed since 07/22/15. Pt stated her cough hasn't improved and wanted to know if she should try something else or what she needs to do. Pharmacy: [facility] [address] Please advise. Thanks TNP

## 2015-08-18 ENCOUNTER — Telehealth: Payer: Self-pay | Admitting: *Deleted

## 2015-08-18 NOTE — Telephone Encounter (Signed)
Patient was contacted today and medication list reviewed. This patient states she is no longer taking doxycycline or Omega 3. All other medications are the same. Medication list was updated accordingly.   This patient states that she has picked up Miralax prescription.   We will [facility]   Patient instructed to call the office should she have further questions.

## 2015-08-23 ENCOUNTER — Encounter
Admission: RE | Disposition: A | Discharge status: Home / Self Care | Payer: Self-pay | Source: Ambulatory Visit | Attending: General Surgery

## 2015-08-23 ENCOUNTER — Ambulatory Visit: Payer: PPO | Admitting: Anesthesiology

## 2015-08-23 ENCOUNTER — Encounter: Payer: Self-pay | Admitting: *Deleted

## 2015-08-23 ENCOUNTER — Ambulatory Visit
Admission: RE | Admit: 2015-08-23 | Discharge: 2015-08-23 | Disposition: A | Discharge status: Home / Self Care | Payer: PPO | Source: Ambulatory Visit | Attending: General Surgery | Admitting: General Surgery

## 2015-08-23 DIAGNOSIS — Z7982 Long term (current) use of aspirin: Secondary | ICD-10-CM | POA: Insufficient documentation

## 2015-08-23 DIAGNOSIS — I10 Essential (primary) hypertension: Secondary | ICD-10-CM | POA: Insufficient documentation

## 2015-08-23 DIAGNOSIS — Z1211 Encounter for screening for malignant neoplasm of colon: Secondary | ICD-10-CM | POA: Insufficient documentation

## 2015-08-23 DIAGNOSIS — E785 Hyperlipidemia, unspecified: Secondary | ICD-10-CM | POA: Diagnosis not present

## 2015-08-23 DIAGNOSIS — F419 Anxiety disorder, unspecified: Secondary | ICD-10-CM | POA: Insufficient documentation

## 2015-08-23 DIAGNOSIS — Z9049 Acquired absence of other specified parts of digestive tract: Secondary | ICD-10-CM | POA: Insufficient documentation

## 2015-08-23 DIAGNOSIS — F418 Other specified anxiety disorders: Secondary | ICD-10-CM | POA: Insufficient documentation

## 2015-08-23 DIAGNOSIS — K219 Gastro-esophageal reflux disease without esophagitis: Secondary | ICD-10-CM | POA: Insufficient documentation

## 2015-08-23 DIAGNOSIS — Z79899 Other long term (current) drug therapy: Secondary | ICD-10-CM | POA: Insufficient documentation

## 2015-08-23 HISTORY — PX: COLONOSCOPY WITH PROPOFOL: SHX5780

## 2015-08-23 LAB — COLOGUARD: COLOGUARD: NEGATIVE

## 2015-08-23 SURGERY — COLONOSCOPY WITH PROPOFOL
Anesthesia: General

## 2015-08-23 MED ORDER — LIDOCAINE HCL (CARDIAC) 20 MG/ML IV SOLN
INTRAVENOUS | Status: DC | PRN
  Administered 2015-08-23: 60 mg via INTRAVENOUS

## 2015-08-23 MED ORDER — FENTANYL CITRATE (PF) 100 MCG/2ML IJ SOLN
INTRAMUSCULAR | Status: DC | PRN
  Administered 2015-08-23: 50 ug via INTRAVENOUS

## 2015-08-23 MED ORDER — PROPOFOL 10 MG/ML IV BOLUS
INTRAVENOUS | Status: DC | PRN
  Administered 2015-08-23: 60 mg via INTRAVENOUS

## 2015-08-23 MED ORDER — SODIUM CHLORIDE 0.9 % IV SOLN
INTRAVENOUS | Status: DC
  Administered 2015-08-23: 14:00:00 via INTRAVENOUS

## 2015-08-23 MED ORDER — PROPOFOL 500 MG/50ML IV EMUL
INTRAVENOUS | Status: DC | PRN
  Administered 2015-08-23: 120 ug/kg/min via INTRAVENOUS

## 2015-08-23 MED ORDER — PHENYLEPHRINE HCL 10 MG/ML IJ SOLN
INTRAMUSCULAR | Status: DC | PRN
  Administered 2015-08-23: 100 ug via INTRAVENOUS

## 2015-08-23 MED ORDER — MIDAZOLAM HCL 2 MG/2ML IJ SOLN
INTRAMUSCULAR | Status: DC | PRN
  Administered 2015-08-23: 1 mg via INTRAVENOUS

## 2015-08-23 NOTE — H&P (Signed)
Jessica BurrowRhoda M Little [id number] 09/14/1945     HPI: Candidate for screening colonoscopy.  Tolerated prep well.   Prescriptions prior to admission  Medication Sig Dispense Refill Last Dose  . aspirin 81 MG tablet Take 81 mg by mouth daily.    08/22/2015 at Unknown time  . atenolol (TENORMIN) 25 MG tablet Take 25 mg by mouth 2 (two) times daily.    08/22/2015 at Unknown time  . CALCIUM CARBONATE-VIT D-MIN PO Take by mouth.   Taking  . LORazepam (ATIVAN) 1 MG tablet Take 1 mg by mouth every 6 (six) hours as needed.    Taking  . Melatonin 5 MG CAPS Take by mouth.   Taking  . nortriptyline (PAMELOR) 50 MG capsule Take 50 mg by mouth 2 (two) times daily.    Taking  . oxybutynin (DITROPAN) 5 MG tablet Take 1 tablet (5 mg total) by mouth every evening. 30 tablet 5 08/22/2015 at Unknown time  . perphenazine (TRILAFON) 8 MG tablet Take 8 mg by mouth at bedtime.    08/22/2015 at Unknown time  . simvastatin (ZOCOR) 20 MG tablet Take 1 tablet (20 mg total) by mouth daily. 90 tablet 3 08/22/2015 at Unknown time  . vitamin E 1000 UNIT capsule Take by mouth.   08/22/2015 at Unknown time  . omeprazole (PRILOSEC) 40 MG capsule Take 40 mg by mouth daily. Reported on 08/23/2015   Not Taking at Unknown time  . polyethylene glycol powder (GLYCOLAX/MIRALAX) powder Take 255 g by mouth once. 255 grams one bottle for colonoscopy prep 255 g 0 Taking   No Known Allergies Past Medical History  Diagnosis Date  . Hyperlipidemia   . Hypertension   . Anxiety   . GERD (gastroesophageal reflux disease)    Past Surgical History  Procedure Laterality Date  . Cholecystectomy    . Colonoscopy  01/01/2006   Social History   Social History  . Marital Status: Widowed    Spouse Name: N/A  . Number of Children: N/A  . Years of Education: College   Occupational History  . Retired    Social History Main Topics  . Smoking status: Never Smoker   . Smokeless tobacco: Never Used  . Alcohol Use: No  . Drug Use: No  . Sexual  Activity: Not on file   Other Topics Concern  . Not on file   Social History Narrative   Social History   Social History Narrative     ROS: Negative.     PE: HEENT: Negative. Lungs: Clear. Cardio: RR. Earline MayotteByrnett, Jeneen Doutt W 08/23/2015   Assessment/Plan:  Proceed with planned endoscopy.

## 2015-08-23 NOTE — Op Note (Signed)
[facility] Patient Name: Jessica Little Procedure Date: 08/23/2015 1:46 PM MRN: [id number] Account #: [id number] Date of Birth: 1945/12/19 Admit Type: Outpatient Age: 70 Room: [facility] ENDO ROOM 1 Gender: Female Note Status: Finalized Procedure:            Colonoscopy Indications:          Screening for colorectal malignant neoplasm Providers:            Earline Mayotte, MD Referring MD:         Leo Grosser, MD (Referring MD) Medicines:            Monitored Anesthesia Care Complications:        No immediate complications. Procedure:            Pre-Anesthesia Assessment:                       - Prior to the procedure, a History and Physical was                        performed, and patient medications, allergies and                        sensitivities were reviewed. The patient's tolerance of                        previous anesthesia was reviewed.                       - The risks and benefits of the procedure and the                        sedation options and risks were discussed with the                        patient. All questions were answered and informed                        consent was obtained.                       After obtaining informed consent, the colonoscope was                        passed under direct vision. Throughout the procedure,                        the patient's blood pressure, pulse, and oxygen                        saturations were monitored continuously. The                        Colonoscope was introduced through the anus and                        advanced to the the cecum, identified by the                        appendiceal orifice, IC valve and transillumination.  The colonoscopy was performed without difficulty. The                        patient tolerated the procedure well. The quality of                        the bowel preparation was excellent. Findings:      The entire  examined colon appeared normal on direct and retroflexion       views. Impression:           - The entire examined colon is normal on direct and                        retroflexion views.                       - No specimens collected. Recommendation:       - Repeat colonoscopy in 10 years for screening purposes. Procedure Code(s):    --- Professional ---                       [id number], Colonoscopy, flexible; diagnostic, including                        collection of specimen(s) by brushing or washing, when                        performed (separate procedure) Diagnosis Code(s):    --- Professional ---                       Z12.11, Encounter for screening for malignant neoplasm                        of colon CPT copyright 2016 American [employer] Association. All rights reserved. The codes documented in this report are preliminary and upon coder review may  be revised to meet current compliance requirements. Earline MayotteJeffrey W. Mark Benecke, MD 08/23/2015 2:12:41 PM This report has been signed electronically. Number of Addenda: 0 Note Initiated On: 08/23/2015 1:46 PM Scope Withdrawal Time: 0 hours 7 minutes 50 seconds  Total Procedure Duration: 0 hours 19 minutes 55 seconds       [facility]

## 2015-08-23 NOTE — Anesthesia Postprocedure Evaluation (Signed)
Anesthesia Post Note  Patient: Jessica Little  Procedure(s) Performed: Procedure(s) (LRB): COLONOSCOPY WITH PROPOFOL (N/A)  Patient location during evaluation: PACU Anesthesia Type: General Level of consciousness: awake Pain management: pain level controlled Vital Signs Assessment: post-procedure vital signs reviewed and stable Respiratory status: spontaneous breathing Cardiovascular status: blood pressure returned to baseline Anesthetic complications: no    Last Vitals:  Filed Vitals:   08/23/15 1419 08/23/15 1427  BP:  106/71  Pulse: 91 88  Temp:    Resp: 27 23    Last Pain: There were no vitals filed for this visit.               VAN STAVEREN,Yuritzi Kamp

## 2015-08-23 NOTE — Anesthesia Preprocedure Evaluation (Signed)
Anesthesia Evaluation  Patient identified by MRN, date of birth, ID band Patient awake    Reviewed: Allergy & Precautions, H&P , NPO status , Patient's Chart, lab work & pertinent test results  History of Anesthesia Complications Negative for: history of anesthetic complications  Airway Mallampati: III  TM Distance: >3 FB Neck ROM: limited    Dental  (+) Poor Dentition, Chipped   Pulmonary neg pulmonary ROS, neg shortness of breath,    Pulmonary exam normal breath sounds clear to auscultation       Cardiovascular Exercise Tolerance: Good hypertension, (-) angina(-) Past MI and (-) DOE Normal cardiovascular exam Rhythm:regular Rate:Normal     Neuro/Psych PSYCHIATRIC DISORDERS Anxiety Depression negative neurological ROS     GI/Hepatic Neg liver ROS, GERD  Medicated and Controlled,  Endo/Other  negative endocrine ROS  Renal/GU negative Renal ROS  negative genitourinary   Musculoskeletal   Abdominal   Peds  Hematology negative hematology ROS (+)   Anesthesia Other Findings Past Medical History:   Hyperlipidemia                                               Hypertension                                                 Anxiety                                                      GERD (gastroesophageal reflux disease)                      Past Surgical History:   CHOLECYSTECTOMY                                               COLONOSCOPY                                      01/01/2006    BMI    Body Mass Index   27.45 kg/m 2      Reproductive/Obstetrics negative OB ROS                             Anesthesia Physical Anesthesia Plan  ASA: III  Anesthesia Plan: General   Post-op Pain Management:    Induction:   Airway Management Planned:   Additional Equipment:   Intra-op Plan:   Post-operative Plan:   Informed Consent: I have reviewed the patients History and Physical, chart,  labs and discussed the procedure including the risks, benefits and alternatives for the proposed anesthesia with the patient or authorized representative who has indicated his/her understanding and acceptance.   Dental Advisory Given  Plan Discussed with: Anesthesiologist, CRNA and Surgeon  Anesthesia Plan Comments:         Anesthesia Quick Evaluation

## 2015-08-23 NOTE — Transfer of Care (Signed)
Immediate Anesthesia Transfer of Care Note  Patient: Jessica Little  Procedure(s) Performed: Procedure(s): COLONOSCOPY WITH PROPOFOL (N/A)  Patient Location: PACU  Anesthesia Type:General  Level of Consciousness: sedated  Airway & Oxygen Therapy: Patient Spontanous Breathing and Patient connected to nasal cannula oxygen  Post-op Assessment: Report given to RN  Post vital signs: Reviewed and stable  Last Vitals:  Filed Vitals:   08/23/15 1417 08/23/15 1419  BP: 102/64 102/64  Pulse: 92 91  Temp: 36.1 C 97.17F  Resp: 25 27    Complications: No apparent anesthesia complications

## 2015-08-24 ENCOUNTER — Encounter: Payer: Self-pay | Admitting: General Surgery

## 2015-09-08 DIAGNOSIS — F28 Other psychotic disorder not due to a substance or known physiological condition: Secondary | ICD-10-CM | POA: Diagnosis not present

## 2015-10-03 DIAGNOSIS — H2513 Age-related nuclear cataract, bilateral: Secondary | ICD-10-CM | POA: Diagnosis not present

## 2015-12-26 ENCOUNTER — Other Ambulatory Visit: Payer: Self-pay | Admitting: Family Medicine

## 2015-12-26 MED ORDER — ATENOLOL 25 MG PO TABS: 25.0000 mg | ORAL_TABLET | Freq: Two times a day (BID) | ORAL | 3 refills | Status: DC

## 2015-12-26 NOTE — Telephone Encounter (Signed)
Pt requesting refill of atenolol (TENORMIN) 25 MG tablet 180 tablets, sent to [facility] of [facility].  3 refills

## 2016-03-27 ENCOUNTER — Other Ambulatory Visit: Payer: Self-pay | Admitting: Physician Assistant

## 2016-03-27 DIAGNOSIS — Z87898 Personal history of other specified conditions: Secondary | ICD-10-CM

## 2016-03-27 DIAGNOSIS — E7800 Pure hypercholesterolemia, unspecified: Secondary | ICD-10-CM

## 2016-03-27 MED ORDER — OXYBUTYNIN CHLORIDE 5 MG PO TABS: 5.0000 mg | ORAL_TABLET | Freq: Every evening | ORAL | 3 refills | Status: DC

## 2016-03-27 MED ORDER — SIMVASTATIN 20 MG PO TABS: 20.0000 mg | ORAL_TABLET | Freq: Every day | ORAL | 3 refills | Status: DC

## 2016-10-05 ENCOUNTER — Ambulatory Visit (INDEPENDENT_AMBULATORY_CARE_PROVIDER_SITE_OTHER): Payer: PPO

## 2016-10-05 VITALS — BP 132/76 | HR 68 | Temp 97.5°F | Ht 66.0 in | Wt 171.0 lb

## 2016-10-05 DIAGNOSIS — Z1159 Encounter for screening for other viral diseases: Secondary | ICD-10-CM | POA: Diagnosis not present

## 2016-10-05 DIAGNOSIS — Z0000 Encounter for general adult medical examination without abnormal findings: Secondary | ICD-10-CM

## 2016-10-05 DIAGNOSIS — Z23 Encounter for immunization: Secondary | ICD-10-CM

## 2016-10-05 NOTE — Patient Instructions (Signed)
[name] , Thank you for taking time to come for your Medicare Wellness Visit. I appreciate your ongoing commitment to your health goals. Please review the following plan we discussed and let me know if I can assist you in the future.   Screening recommendations/referrals: Colonoscopy: completed 08/23/15, due 07/2025 Mammogram: completed 05/13/14, pt to schedule this year Bone Density: completed 05/13/14, due 04/2024 Recommended yearly ophthalmology/optometry visit for glaucoma screening and checkup Recommended yearly dental visit for hygiene and checkup  Vaccinations: Influenza vaccine: due 01/2017 Pneumococcal vaccine: completed series today Tdap vaccine: declined Shingles vaccine: declined   Advanced directives: Please bring a copy of your POA (Power of Attorney) and/or Living Will to your next appointment.   Conditions/risks identified: Recommend increasing water intake to 4 glasses daily.  Next appointment: 10/10/16 @ 3 PM   Preventive Care 65 Years and Older, Female Preventive care refers to lifestyle choices and visits with your health care provider that can promote health and wellness. What does preventive care include?  A yearly physical exam. This is also called an annual well check.  Dental exams once or twice a year.  Routine eye exams. Ask your health care provider how often you should have your eyes checked.  Personal lifestyle choices, including:  Daily care of your teeth and gums.  Regular physical activity.  Eating a healthy diet.  Avoiding tobacco and drug use.  Limiting alcohol use.  Practicing safe sex.  Taking low-dose aspirin every day.  Taking vitamin and mineral supplements as recommended by your health care provider. What happens during an annual well check? The services and screenings done by your health care provider during your annual well check will depend on your age, overall health, lifestyle risk factors, and family history of  disease. Counseling  Your health care provider may ask you questions about your:  Alcohol use.  Tobacco use.  Drug use.  Emotional well-being.  Home and relationship well-being.  Sexual activity.  Eating habits.  History of falls.  Memory and ability to understand (cognition).  Work and work Astronomerenvironment.  Reproductive health. Screening  You may have the following tests or measurements:  Height, weight, and BMI.  Blood pressure.  Lipid and cholesterol levels. These may be checked every 5 years, or more frequently if you are over 642 years old.  Skin check.  Lung cancer screening. You may have this screening every year starting at age 69 if you have a 30-pack-year history of smoking and currently smoke or have quit within the past 15 years.  Fecal occult blood test (FOBT) of the stool. You may have this test every year starting at age 69.  Flexible sigmoidoscopy or colonoscopy. You may have a sigmoidoscopy every 5 years or a colonoscopy every 10 years starting at age 69.  Hepatitis C blood test.  Hepatitis B blood test.  Sexually transmitted disease (STD) testing.  Diabetes screening. This is done by checking your blood sugar (glucose) after you have not eaten for a while (fasting). You may have this done every 1-3 years.  Bone density scan. This is done to screen for osteoporosis. You may have this done starting at age 365.  Mammogram. This may be done every 1-2 years. Talk to your health care provider about how often you should have regular mammograms. Talk with your health care provider about your test results, treatment options, and if necessary, the need for more tests. Vaccines  Your health care provider may recommend certain vaccines, such as:  Influenza  vaccine. This is recommended every year.  Tetanus, diphtheria, and acellular pertussis (Tdap, Td) vaccine. You may need a Td booster every 10 years.  Zoster vaccine. You may need this after age  23.  Pneumococcal 13-valent conjugate (PCV13) vaccine. One dose is recommended after age 1.  Pneumococcal polysaccharide (PPSV23) vaccine. One dose is recommended after age 74. Talk to your health care provider about which screenings and vaccines you need and how often you need them. This information is not intended to replace advice given to you by your health care provider. Make sure you discuss any questions you have with your health care provider. Document Released: 06/10/2015 Document Revised: 02/01/2016 Document Reviewed: 03/15/2015 Elsevier Interactive Patient Education  2017 [place] Prevention in the Home Falls can cause injuries. They can happen to people of all ages. There are many things you can do to make your home safe and to help prevent falls. What can I do on the outside of my home?  Regularly fix the edges of walkways and driveways and fix any cracks.  Remove anything that might make you trip as you walk through a door, such as a raised step or threshold.  Trim any bushes or trees on the path to your home.  Use bright outdoor lighting.  Clear any walking paths of anything that might make someone trip, such as rocks or tools.  Regularly check to see if handrails are loose or broken. Make sure that both sides of any steps have handrails.  Any raised decks and porches should have guardrails on the edges.  Have any leaves, snow, or ice cleared regularly.  Use sand or salt on walking paths during winter.  Clean up any spills in your garage right away. This includes oil or grease spills. What can I do in the bathroom?  Use night lights.  Install grab bars by the toilet and in the tub and shower. Do not use towel bars as grab bars.  Use non-skid mats or decals in the tub or shower.  If you need to sit down in the shower, use a plastic, non-slip stool.  Keep the floor dry. Clean up any water that spills on the floor as soon as it happens.  Remove  soap buildup in the tub or shower regularly.  Attach bath mats securely with double-sided non-slip rug tape.  Do not have throw rugs and other things on the floor that can make you trip. What can I do in the bedroom?  Use night lights.  Make sure that you have a light by your bed that is easy to reach.  Do not use any sheets or blankets that are too big for your bed. They should not hang down onto the floor.  Have a firm chair that has side arms. You can use this for support while you get dressed.  Do not have throw rugs and other things on the floor that can make you trip. What can I do in the kitchen?  Clean up any spills right away.  Avoid walking on wet floors.  Keep items that you use a lot in easy-to-reach places.  If you need to reach something above you, use a strong step stool that has a grab bar.  Keep electrical cords out of the way.  Do not use floor polish or wax that makes floors slippery. If you must use wax, use non-skid floor wax.  Do not have throw rugs and other things on the floor that can  make you trip. What can I do with my stairs?  Do not leave any items on the stairs.  Make sure that there are handrails on both sides of the stairs and use them. Fix handrails that are broken or loose. Make sure that handrails are as long as the stairways.  Check any carpeting to make sure that it is firmly attached to the stairs. Fix any carpet that is loose or worn.  Avoid having throw rugs at the top or bottom of the stairs. If you do have throw rugs, attach them to the floor with carpet tape.  Make sure that you have a light switch at the top of the stairs and the bottom of the stairs. If you do not have them, ask someone to add them for you. What else can I do to help prevent falls?  Wear shoes that:  Do not have high heels.  Have rubber bottoms.  Are comfortable and fit you well.  Are closed at the toe. Do not wear sandals.  If you use a  stepladder:  Make sure that it is fully opened. Do not climb a closed stepladder.  Make sure that both sides of the stepladder are locked into place.  Ask someone to hold it for you, if possible.  Clearly mark and make sure that you can see:  Any grab bars or handrails.  First and last steps.  Where the edge of each step is.  Use tools that help you move around (mobility aids) if they are needed. These include:  Canes.  Walkers.  Scooters.  Crutches.  Turn on the lights when you go into a dark area. Replace any light bulbs as soon as they burn out.  Set up your furniture so you have a clear path. Avoid moving your furniture around.  If any of your floors are uneven, fix them.  If there are any pets around you, be aware of where they are.  Review your medicines with your doctor. Some medicines can make you feel dizzy. This can increase your chance of falling. Ask your doctor what other things that you can do to help prevent falls. This information is not intended to replace advice given to you by your health care provider. Make sure you discuss any questions you have with your health care provider. Document Released: 03/10/2009 Document Revised: 10/20/2015 Document Reviewed: 06/18/2014 Elsevier Interactive Patient Education  2017 [employer].

## 2016-10-05 NOTE — Progress Notes (Signed)
Subjective:   Jessica Little is a 71 y.o. female who presents for Medicare Annual (Subsequent) preventive examination.  Review of Systems:  N/A  Cardiac Risk Factors include: advanced age (>3855men, 101>65 women);dyslipidemia;hypertension     Objective:     Vitals: BP 132/76 (BP Location: Right Arm)   Pulse 68   Temp 97.5 F (36.4 C) (Oral)   Ht 5\' 6"  (1.676 m)   Wt 171 lb (77.6 kg)   BMI 27.60 kg/m   Body mass index is 27.6 kg/m.   Tobacco History  Smoking Status  . Never Smoker  Smokeless Tobacco  . Never Used     Counseling given: Not Answered   Past Medical History:  Diagnosis Date  . Anxiety   . GERD (gastroesophageal reflux disease)   . Hyperlipidemia   . Hypertension    Past Surgical History:  Procedure Laterality Date  . CHOLECYSTECTOMY    . COLONOSCOPY  01/01/2006  . COLONOSCOPY WITH PROPOFOL N/A 08/23/2015   Procedure: COLONOSCOPY WITH PROPOFOL;  Surgeon: Earline MayotteJeffrey W Byrnett, MD;  [facility];  Service: Endoscopy;  Laterality: N/A;   Family History  Problem Relation Age of Onset  . Arrhythmia Mother   . Heart attack Father   . Stroke Father   . Healthy Sister   . Healthy Brother   . Healthy Sister    History  Sexual Activity  . Sexual activity: Not on file    Outpatient Encounter Prescriptions as of 10/05/2016  Medication Sig  . aspirin 81 MG tablet Take 81 mg by mouth daily.   . Ca Phosphate-Cholecalciferol (CALCIUM 500 + D3) 250-500 MG-UNIT CHEW Chew 2 capsules by mouth daily.  Marland Kitchen. CALCIUM CARBONATE-VIT D-MIN PO Take by mouth.  Marland Kitchen. LORazepam (ATIVAN) 1 MG tablet Take 1 mg by mouth every 6 (six) hours as needed.   . Melatonin 5 MG CAPS Take by mouth at bedtime as needed.   . nortriptyline (PAMELOR) 50 MG capsule Take 50 mg by mouth 2 (two) times daily.   Marland Kitchen. oxybutynin (DITROPAN) 5 MG tablet Take 1 tablet (5 mg total) by mouth every evening.  Marland Kitchen. perphenazine (TRILAFON) 8 MG tablet Take 8 mg by mouth at bedtime.   . simvastatin (ZOCOR)  20 MG tablet Take 1 tablet (20 mg total) by mouth daily.  . vitamin E 1000 UNIT capsule Take by mouth.  Marland Kitchen. atenolol (TENORMIN) 25 MG tablet Take 1 tablet (25 mg total) by mouth 2 (two) times daily. (Patient not taking: Reported on 10/05/2016)  . omeprazole (PRILOSEC) 40 MG capsule Take 40 mg by mouth daily. Reported on 08/23/2015   No facility-administered encounter medications on file as of 10/05/2016.     Activities of Daily Living In your present state of health, do you have any difficulty performing the following activities: 10/05/2016  Hearing? N  Vision? N  Difficulty concentrating or making decisions? N  Walking or climbing stairs? N  Dressing or bathing? N  Doing errands, shopping? N  Preparing Food and eating ? N  Using the Toilet? N  In the past six months, have you accidently leaked urine? Y  Do you have problems with loss of bowel control? N  Managing your Medications? N  Managing your Finances? N  Housekeeping or managing your Housekeeping? N  Some recent data might be hidden    Patient Care Team: Reine JustBurnette, Jennifer M, PA-C as PCP - General (Family Medicine) Lemar LivingsByrnett, Merrily PewJeffrey W, MD (General Surgery) Cottle, Steva Readyarey G Jr., MD as Attending Physician (  Psychiatry)    Assessment:     Exercise Activities and Dietary recommendations Current Exercise Habits: Structured exercise class, Type of exercise: strength training/weights;walking, Time (Minutes): 60, Frequency (Times/Week): 2, Weekly Exercise (Minutes/Week): 120, Intensity: Mild, Exercise limited by: None identified  Goals    . Increase water intake          Recommend increasing water intake to 4 glasses a day.      Fall Risk Fall Risk  10/05/2016 07/06/2015  Falls in the past year? Yes No  Number falls in past yr: 2 or more -  Injury with Fall? No -  Follow up Falls prevention discussed -   Depression Screen PHQ 2/9 Scores 10/05/2016 10/05/2016 07/06/2015  PHQ - 2 Score 0 0 0  PHQ- 9 Score 1 - -     Cognitive  Function     6CIT Screen 10/05/2016  What Year? 0 points  What month? 0 points  What time? 0 points  Count back from 20 0 points  Months in reverse 0 points  Repeat phrase 2 points  Total Score 2    Immunization History  Administered Date(s) Administered  . Hepatitis A 08/13/2001  . Pneumococcal Conjugate-13 07/06/2015  . Pneumococcal Polysaccharide-23 10/05/2016  . Td 10/24/2000   Screening Tests Health Maintenance  Topic Date Due  . MAMMOGRAM  09/25/2017 (Originally 05/13/2016)  . TETANUS/TDAP  05/28/2026 (Originally 10/25/2010)  . INFLUENZA VACCINE  12/26/2016  . COLONOSCOPY  08/22/2025  . DEXA SCAN  Completed  . Hepatitis C Screening  Completed  . PNA vac Low Risk Adult  Completed      Plan:  I have personally reviewed and addressed the Medicare Annual Wellness questionnaire and have noted the following in the patient's chart:  A. Medical and social history B. Use of alcohol, tobacco or illicit drugs  C. Current medications and supplements D. Functional ability and status E.  Nutritional status F.  Physical activity G. Advance directives H. List of other physicians I.  Hospitalizations, surgeries, and ER visits in previous 12 months J.  Vitals K. Screenings such as hearing and vision if needed, cognitive and depression L. Referrals and appointments - none  In addition, I have reviewed and discussed with patient certain preventive protocols, quality metrics, and best practice recommendations. A written personalized care plan for preventive services as well as general preventive health recommendations were provided to patient.  See attached scanned questionnaire for additional information.   Signed,  Hyacinth Meeker, LPN Nurse Health Advisor   MD Recommendations: None, pt declined tetanus vaccine today and states she will set up her mammogram within the next year.  I have reviewed the documentation and information obtained by Hyacinth Meeker, LPN in the  above chart and agree as above. I was available for consultation if any questions or issues arose.  Joycelyn Man, PA-C

## 2016-10-10 ENCOUNTER — Encounter: Payer: Self-pay | Admitting: Physician Assistant

## 2016-10-10 ENCOUNTER — Ambulatory Visit (INDEPENDENT_AMBULATORY_CARE_PROVIDER_SITE_OTHER): Payer: PPO | Admitting: Physician Assistant

## 2016-10-10 VITALS — BP 110/80 | HR 88 | Temp 97.7°F | Resp 20 | Ht 66.0 in | Wt 171.0 lb

## 2016-10-10 DIAGNOSIS — Z0000 Encounter for general adult medical examination without abnormal findings: Secondary | ICD-10-CM

## 2016-10-10 DIAGNOSIS — I10 Essential (primary) hypertension: Secondary | ICD-10-CM | POA: Diagnosis not present

## 2016-10-10 DIAGNOSIS — Z1239 Encounter for other screening for malignant neoplasm of breast: Secondary | ICD-10-CM

## 2016-10-10 DIAGNOSIS — M8580 Other specified disorders of bone density and structure, unspecified site: Secondary | ICD-10-CM

## 2016-10-10 DIAGNOSIS — E7800 Pure hypercholesterolemia, unspecified: Secondary | ICD-10-CM

## 2016-10-10 DIAGNOSIS — E041 Nontoxic single thyroid nodule: Secondary | ICD-10-CM | POA: Diagnosis not present

## 2016-10-10 DIAGNOSIS — Z780 Asymptomatic menopausal state: Secondary | ICD-10-CM | POA: Diagnosis not present

## 2016-10-10 DIAGNOSIS — Z1231 Encounter for screening mammogram for malignant neoplasm of breast: Secondary | ICD-10-CM

## 2016-10-10 NOTE — Patient Instructions (Signed)
Health Maintenance for Postmenopausal Women Menopause is a normal process in which your reproductive ability comes to an end. This process happens gradually over a span of months to years, usually between the ages of 33 and 38. Menopause is complete when you have missed 12 consecutive menstrual periods. It is important to talk with your health care provider about some of the most common conditions that affect postmenopausal women, such as heart disease, cancer, and bone loss (osteoporosis). Adopting a healthy lifestyle and getting preventive care can help to promote your health and wellness. Those actions can also lower your chances of developing some of these common conditions. What should I know about menopause? During menopause, you may experience a number of symptoms, such as:  Moderate-to-severe hot flashes.  Night sweats.  Decrease in sex drive.  Mood swings.  Headaches.  Tiredness.  Irritability.  Memory problems.  Insomnia. Choosing to treat or not to treat menopausal changes is an individual decision that you make with your health care provider. What should I know about hormone replacement therapy and supplements? Hormone therapy products are effective for treating symptoms that are associated with menopause, such as hot flashes and night sweats. Hormone replacement carries certain risks, especially as you become older. If you are thinking about using estrogen or estrogen with progestin treatments, discuss the benefits and risks with your health care provider. What should I know about heart disease and stroke? Heart disease, heart attack, and stroke become more likely as you age. This may be due, in part, to the hormonal changes that your body experiences during menopause. These can affect how your body processes dietary fats, triglycerides, and cholesterol. Heart attack and stroke are both medical emergencies. There are many things that you can do to help prevent heart disease  and stroke:  Have your blood pressure checked at least every 1-2 years. High blood pressure causes heart disease and increases the risk of stroke.  If you are 48-61 years old, ask your health care provider if you should take aspirin to prevent a heart attack or a stroke.  Do not use any tobacco products, including cigarettes, chewing tobacco, or electronic cigarettes. If you need help quitting, ask your health care provider.  It is important to eat a healthy diet and maintain a healthy weight.  Be sure to include plenty of vegetables, fruits, low-fat dairy products, and lean protein.  Avoid eating foods that are high in solid fats, added sugars, or salt (sodium).  Get regular exercise. This is one of the most important things that you can do for your health.  Try to exercise for at least 150 minutes each week. The type of exercise that you do should increase your heart rate and make you sweat. This is known as moderate-intensity exercise.  Try to do strengthening exercises at least twice each week. Do these in addition to the moderate-intensity exercise.  Know your numbers.Ask your health care provider to check your cholesterol and your blood glucose. Continue to have your blood tested as directed by your health care provider. What should I know about cancer screening? There are several types of cancer. Take the following steps to reduce your risk and to catch any cancer development as early as possible. Breast Cancer  Practice breast self-awareness.  This means understanding how your breasts normally appear and feel.  It also means doing regular breast self-exams. Let your health care provider know about any changes, no matter how small.  If you are 40 or older,  have a clinician do a breast exam (clinical breast exam or CBE) every year. Depending on your age, family history, and medical history, it may be recommended that you also have a yearly breast X-ray (mammogram).  If you  have a family history of breast cancer, talk with your health care provider about genetic screening.  If you are at high risk for breast cancer, talk with your health care provider about having an MRI and a mammogram every year.  Breast cancer (BRCA) gene test is recommended for women who have family members with BRCA-related cancers. Results of the assessment will determine the need for genetic counseling and BRCA1 and for BRCA2 testing. BRCA-related cancers include these types:  Breast. This occurs in males or females.  Ovarian.  Tubal. This may also be called fallopian tube cancer.  Cancer of the abdominal or pelvic lining (peritoneal cancer).  Prostate.  Pancreatic. Cervical, Uterine, and Ovarian Cancer  Your health care provider may recommend that you be screened regularly for cancer of the pelvic organs. These include your ovaries, uterus, and vagina. This screening involves a pelvic exam, which includes checking for microscopic changes to the surface of your cervix (Pap test).  For women ages 21-65, health care providers may recommend a pelvic exam and a Pap test every three years. For women ages 23-65, they may recommend the Pap test and pelvic exam, combined with testing for human papilloma virus (HPV), every five years. Some types of HPV increase your risk of cervical cancer. Testing for HPV may also be done on women of any age who have unclear Pap test results.  Other health care providers may not recommend any screening for nonpregnant women who are considered low risk for pelvic cancer and have no symptoms. Ask your health care provider if a screening pelvic exam is right for you.  If you have had past treatment for cervical cancer or a condition that could lead to cancer, you need Pap tests and screening for cancer for at least 20 years after your treatment. If Pap tests have been discontinued for you, your risk factors (such as having a new sexual partner) need to be reassessed  to determine if you should start having screenings again. Some women have medical problems that increase the chance of getting cervical cancer. In these cases, your health care provider may recommend that you have screening and Pap tests more often.  If you have a family history of uterine cancer or ovarian cancer, talk with your health care provider about genetic screening.  If you have vaginal bleeding after reaching menopause, tell your health care provider.  There are currently no reliable tests available to screen for ovarian cancer. Lung Cancer  Lung cancer screening is recommended for adults 99-83 years old who are at high risk for lung cancer because of a history of smoking. A yearly low-dose CT scan of the lungs is recommended if you:  Currently smoke.  Have a history of at least 30 pack-years of smoking and you currently smoke or have quit within the past 15 years. A pack-year is smoking an average of one pack of cigarettes per day for one year. Yearly screening should:  Continue until it has been 15 years since you quit.  Stop if you develop a health problem that would prevent you from having lung cancer treatment. Colorectal Cancer  This type of cancer can be detected and can often be prevented.  Routine colorectal cancer screening usually begins at age 72 and continues  through age 75.  If you have risk factors for colon cancer, your health care provider may recommend that you be screened at an earlier age.  If you have a family history of colorectal cancer, talk with your health care provider about genetic screening.  Your health care provider may also recommend using home test kits to check for hidden blood in your stool.  A small camera at the end of a tube can be used to examine your colon directly (sigmoidoscopy or colonoscopy). This is done to check for the earliest forms of colorectal cancer.  Direct examination of the colon should be repeated every 5-10 years until  age 75. However, if early forms of precancerous polyps or small growths are found or if you have a family history or genetic risk for colorectal cancer, you may need to be screened more often. Skin Cancer  Check your skin from head to toe regularly.  Monitor any moles. Be sure to tell your health care provider:  About any new moles or changes in moles, especially if there is a change in a mole's shape or color.  If you have a mole that is larger than the size of a pencil eraser.  If any of your family members has a history of skin cancer, especially at a young age, talk with your health care provider about genetic screening.  Always use sunscreen. Apply sunscreen liberally and repeatedly throughout the day.  Whenever you are outside, protect yourself by wearing long sleeves, pants, a wide-brimmed hat, and sunglasses. What should I know about osteoporosis? Osteoporosis is a condition in which bone destruction happens more quickly than new bone creation. After menopause, you may be at an increased risk for osteoporosis. To help prevent osteoporosis or the bone fractures that can happen because of osteoporosis, the following is recommended:  If you are 19-50 years old, get at least 1,000 mg of calcium and at least 600 mg of vitamin D per day.  If you are older than age 50 but younger than age 70, get at least 1,200 mg of calcium and at least 600 mg of vitamin D per day.  If you are older than age 70, get at least 1,200 mg of calcium and at least 800 mg of vitamin D per day. Smoking and excessive alcohol intake increase the risk of osteoporosis. Eat foods that are rich in calcium and vitamin D, and do weight-bearing exercises several times each week as directed by your health care provider. What should I know about how menopause affects my mental health? Depression may occur at any age, but it is more common as you become older. Common symptoms of depression include:  Low or sad  mood.  Changes in sleep patterns.  Changes in appetite or eating patterns.  Feeling an overall lack of motivation or enjoyment of activities that you previously enjoyed.  Frequent crying spells. Talk with your health care provider if you think that you are experiencing depression. What should I know about immunizations? It is important that you get and maintain your immunizations. These include:  Tetanus, diphtheria, and pertussis (Tdap) booster vaccine.  Influenza every year before the flu season begins.  Pneumonia vaccine.  Shingles vaccine. Your health care provider may also recommend other immunizations. This information is not intended to replace advice given to you by your health care provider. Make sure you discuss any questions you have with your health care provider. Document Released: 07/06/2005 Document Revised: 12/02/2015 Document Reviewed: 02/15/2015 Elsevier Interactive Patient   Education  2017 [employer]  

## 2016-10-10 NOTE — Progress Notes (Signed)
Patient: Jessica Little, Female    DOB: Feb 17, 1946, 71 y.o.   MRN: [id number] Visit Date: 10/10/2016  Today's Provider: Margaretann LovelessJennifer M Burnette, PA-C   Chief Complaint  Patient presents with  . Annual Exam   Subjective:    Annual wellness visit Jessica Little is a 71 y.o. female. She feels well. She reports exercising 2 times week. She reports she is sleeping well.  10/05/16 AWE 05/13/14 Mammogram-BI-RADS 1 08/23/15 Colonoscopy-WNL, recheck in 10 yrs. 05/13/14 BMD-osteopenia -----------------------------------------------------------   Review of Systems  Constitutional: Negative.   HENT: Positive for congestion.   Eyes: Negative.   Respiratory: Negative.   Cardiovascular: Negative.   Gastrointestinal: Negative.   Endocrine: Negative.   Genitourinary: Negative.   Musculoskeletal: Negative.   Skin: Negative.   Allergic/Immunologic: Negative.   Neurological: Negative.   Hematological: Negative.   Psychiatric/Behavioral: Negative.     Social History   Social History  . Marital status: Widowed    Spouse name: N/A  . Number of children: N/A  . Years of education: College   Occupational History  . Retired    Social History Main Topics  . Smoking status: Never Smoker  . Smokeless tobacco: Never Used  . Alcohol use No  . Drug use: No  . Sexual activity: Not on file   Other Topics Concern  . Not on file   Social History Narrative  . No narrative on file    Past Medical History:  Diagnosis Date  . Anxiety   . GERD (gastroesophageal reflux disease)   . Hyperlipidemia   . Hypertension      Patient Active Problem List   Diagnosis Date Noted  . Special screening for malignant neoplasms, colon 07/12/2015  . Encounter for screening colonoscopy 07/12/2015  . H/O urinary frequency 07/06/2015  . Anxiety 02/03/2015  . Clinical depression 02/03/2015  . Acid reflux 02/03/2015  . HLD (hyperlipidemia) 02/03/2015  . BP (high blood pressure) 02/03/2015  .  Cannot sleep 02/03/2015  . Dislocated shoulder 02/03/2015  . Thyroid nodule 02/03/2015    Past Surgical History:  Procedure Laterality Date  . CHOLECYSTECTOMY    . COLONOSCOPY  01/01/2006  . COLONOSCOPY WITH PROPOFOL N/A 08/23/2015   Procedure: COLONOSCOPY WITH PROPOFOL;  Surgeon: Earline MayotteJeffrey W Byrnett, MD;  [facility];  Service: Endoscopy;  Laterality: N/A;    Her family history includes Arrhythmia in her mother; Healthy in her brother, sister, and sister; Heart attack in her father; Stroke in her father.      Current Outpatient Prescriptions:  .  aspirin 81 MG tablet, Take 81 mg by mouth daily. , Disp: , Rfl:  .  atenolol (TENORMIN) 25 MG tablet, Take 1 tablet (25 mg total) by mouth 2 (two) times daily., Disp: 180 tablet, Rfl: 3 .  Ca Phosphate-Cholecalciferol (CALCIUM 500 + D3) 250-500 MG-UNIT CHEW, Chew 2 capsules by mouth daily., Disp: , Rfl:  .  CALCIUM CARBONATE-VIT D-MIN PO, Take by mouth., Disp: , Rfl:  .  LORazepam (ATIVAN) 1 MG tablet, Take 1 mg by mouth every 6 (six) hours as needed. , Disp: , Rfl:  .  Melatonin 5 MG CAPS, Take by mouth at bedtime as needed. , Disp: , Rfl:  .  nortriptyline (PAMELOR) 50 MG capsule, Take 50 mg by mouth 2 (two) times daily. , Disp: , Rfl:  .  omeprazole (PRILOSEC) 40 MG capsule, Take 40 mg by mouth daily. Reported on 08/23/2015, Disp: , Rfl:  .  oxybutynin (DITROPAN)  5 MG tablet, Take 1 tablet (5 mg total) by mouth every evening., Disp: 90 tablet, Rfl: 3 .  perphenazine (TRILAFON) 8 MG tablet, Take 8 mg by mouth at bedtime. , Disp: , Rfl:  .  simvastatin (ZOCOR) 20 MG tablet, Take 1 tablet (20 mg total) by mouth daily., Disp: 90 tablet, Rfl: 3 .  vitamin E 1000 UNIT capsule, Take by mouth., Disp: , Rfl:   Patient Care Team: Margaretann Loveless, PA-C as PCP - General (Family Medicine) Lemar Livings, Merrily Pew, MD (General Surgery) Cottle, Steva Ready., MD as Attending Physician (Psychiatry)     Objective:   Vitals: BP 110/80 (BP  Location: Left Arm, Patient Position: Sitting, Cuff Size: Large)   Pulse 88   Temp 97.7 F (36.5 C) (Oral)   Resp 20   Ht 5\' 6"  (1.676 m)   Wt 171 lb (77.6 kg)   BMI 27.60 kg/m   Physical Exam  Activities of Daily Living In your present state of health, do you have any difficulty performing the following activities: 10/05/2016  Hearing? N  Vision? N  Difficulty concentrating or making decisions? N  Walking or climbing stairs? N  Dressing or bathing? N  Doing errands, shopping? N  Preparing Food and eating ? N  Using the Toilet? N  In the past six months, have you accidently leaked urine? Y  Do you have problems with loss of bowel control? N  Managing your Medications? N  Managing your Finances? N  Housekeeping or managing your Housekeeping? N  Some recent data might be hidden    Fall Risk Assessment Fall Risk  10/05/2016 07/06/2015  Falls in the past year? Yes No  Number falls in past yr: 2 or more -  Injury with Fall? No -  Follow up Falls prevention discussed -     Depression Screen PHQ 2/9 Scores 10/05/2016 10/05/2016 07/06/2015  PHQ - 2 Score 0 0 0  PHQ- 9 Score 1 - -    Cognitive Testing - 6-CIT (Done on 10/05/16)  Correct? Score   What year is it? yes 0 0 or 4  What month is it? yes 0 0 or 3  Memorize:    Floyde Parkins,  44,  [address],  [place],      What time is it? (within 1 hour) yes 0 0 or 3  Count backwards from 20 yes 0 0, 2, or 4  Name the months of the year yes 0 0, 2, or 4  Repeat name & address above no 2 0, 2, 4, 6, 8, or 10       TOTAL SCORE  2/28   Interpretation:  Normal  Normal (0-7) Abnormal (8-28)    Audit-C Alcohol Use Screening  Question Answer Points  How often do you have alcoholic drink? never 0  On days you do drink alcohol, how many drinks do you typically consume? 0 0  How oftey will you drink 6 or more in a total? never 0  Total Score:  0   A score of 3 or more in women, and 4 or more in men indicates increased risk for alcohol  abuse, EXCEPT if all of the points are from question 1.     Assessment & Plan:     Annual Wellness Visit  Reviewed patient's Family Medical History Reviewed and updated list of patient's medical providers Assessment of cognitive impairment was done Assessed patient's functional ability Established a written schedule for health screening services Health  Risk Assessent Completed and Reviewed  Exercise Activities and Dietary recommendations Goals    . Increase water intake          Recommend increasing water intake to 4 glasses a day.       Immunization History  Administered Date(s) Administered  . Hepatitis A 08/13/2001  . Pneumococcal Conjugate-13 07/06/2015  . Pneumococcal Polysaccharide-23 10/05/2016  . Td 10/24/2000    Health Maintenance  Topic Date Due  . MAMMOGRAM  09/25/2017 (Originally 05/13/2016)  . TETANUS/TDAP  05/28/2026 (Originally 10/25/2010)  . INFLUENZA VACCINE  12/26/2016  . COLONOSCOPY  08/22/2025  . DEXA SCAN  Completed  . Hepatitis C Screening  Completed  . PNA vac Low Risk Adult  Completed     Discussed health benefits of physical activity, and encouraged her to engage in regular exercise appropriate for her age and condition.    1. Annual physical exam Normal physical exam. Up to date on vaccinations. Discussed new Shingrix vaccine.   2. Breast cancer screening Breast exam today was normal. There is no family history of breast cancer. She does perform regular self breast exams. Mammogram was ordered as below. Information for [facility] clinic was given to patient so she may schedule her mammogram at her convenience. - MM Digital Screening; Future  3. Essential hypertension Stable. Continue atenolol 25mg . Will check labs as below and f/u pending results. - CBC with Differential/Platelet - Comprehensive metabolic panel - TSH - Lipid Panel With LDL/HDL Ratio  4. Pure hypercholesterolemia Stable. Continue simvastatin 20mg . Will check  labs as below and f/u pending results. - Comprehensive metabolic panel - Lipid Panel With LDL/HDL Ratio  5. Thyroid nodule No symptoms. Will check labs as below and f/u pending results. - TSH  6. Osteopenia, unspecified location Due for repeat bone density. She is currently taking OTC Vit D and calcium supplementation. Advised her to schedule with mammogram. She agrees. Will f/u pending results.  - DG Bone Density; Future  7. Postmenopausal estrogen deficiency See above medical treatment plan. - DG Bone Density; Future  ------------------------------------------------------------------------------------------------------------    Margaretann Loveless, PA-C  [facility]

## 2016-10-19 DIAGNOSIS — E041 Nontoxic single thyroid nodule: Secondary | ICD-10-CM | POA: Diagnosis not present

## 2016-10-19 DIAGNOSIS — I10 Essential (primary) hypertension: Secondary | ICD-10-CM | POA: Diagnosis not present

## 2016-10-19 DIAGNOSIS — E7800 Pure hypercholesterolemia, unspecified: Secondary | ICD-10-CM | POA: Diagnosis not present

## 2016-10-20 LAB — COMPREHENSIVE METABOLIC PANEL
A/G RATIO: 1.6 (ref 1.2–2.2)
ALBUMIN: 4.3 g/dL (ref 3.6–4.8)
ALT: 12 IU/L (ref 0–32)
AST: 19 IU/L (ref 0–40)
Alkaline Phosphatase: 71 IU/L (ref 39–117)
BUN/Creatinine Ratio: 12 (ref 12–28)
BUN: 14 mg/dL (ref 8–27)
Bilirubin Total: 0.4 mg/dL (ref 0.0–1.2)
CALCIUM: 8.9 mg/dL (ref 8.7–10.3)
CO2: 27 mmol/L (ref 18–29)
Chloride: 100 mmol/L (ref 96–106)
Creatinine, Ser: 1.15 mg/dL — ABNORMAL HIGH (ref 0.57–1.00)
GFR, EST AFRICAN AMERICAN: 56 mL/min/{1.73_m2} — AB (ref >59)
GFR, EST NON AFRICAN AMERICAN: 49 mL/min/{1.73_m2} — AB (ref >59)
GLOBULIN, TOTAL: 2.7 g/dL (ref 1.5–4.5)
Glucose: 97 mg/dL (ref 65–99)
POTASSIUM: 3.8 mmol/L (ref 3.5–5.2)
SODIUM: 142 mmol/L (ref 134–144)
TOTAL PROTEIN: 7 g/dL (ref 6.0–8.5)

## 2016-10-20 LAB — CBC WITH DIFFERENTIAL/PLATELET
Basophils Absolute: 0 10*3/uL (ref 0.0–0.2)
Basos: 1 %
EOS (ABSOLUTE): 0.1 10*3/uL (ref 0.0–0.4)
Eos: 1 %
HEMATOCRIT: 41.7 % (ref 34.0–46.6)
Hemoglobin: 13.5 g/dL (ref 11.1–15.9)
IMMATURE GRANULOCYTES: 0 %
Immature Grans (Abs): 0 10*3/uL (ref 0.0–0.1)
Lymphocytes Absolute: 2.7 10*3/uL (ref 0.7–3.1)
Lymphs: 43 %
MCH: 26.7 pg (ref 26.6–33.0)
MCHC: 32.4 g/dL (ref 31.5–35.7)
MCV: 82 fL (ref 79–97)
MONOS ABS: 0.6 10*3/uL (ref 0.1–0.9)
Monocytes: 9 %
NEUTROS PCT: 46 %
Neutrophils Absolute: 2.9 10*3/uL (ref 1.4–7.0)
Platelets: 222 10*3/uL (ref 150–379)
RBC: 5.06 x10E6/uL (ref 3.77–5.28)
RDW: 15.1 % (ref 12.3–15.4)
WBC: 6.2 10*3/uL (ref 3.4–10.8)

## 2016-10-20 LAB — LIPID PANEL WITH LDL/HDL RATIO
Cholesterol, Total: 187 mg/dL (ref 100–199)
HDL: 40 mg/dL (ref >39)
LDL CALC: 114 mg/dL — AB (ref 0–99)
LDL/HDL RATIO: 2.9 ratio (ref 0.0–3.2)
Triglycerides: 164 mg/dL — ABNORMAL HIGH (ref 0–149)
VLDL Cholesterol Cal: 33 mg/dL (ref 5–40)

## 2016-10-20 LAB — TSH: TSH: 3.46 u[IU]/mL (ref 0.450–4.500)

## 2016-10-23 ENCOUNTER — Telehealth: Payer: Self-pay

## 2016-10-23 NOTE — Telephone Encounter (Signed)
-----   Message from Margaretann LovelessJennifer M Burnette, [place]PA-C sent at 10/23/2016  2:16 PM EDT ----- All labs are within normal limits and stable.  Cholesterol continues to improve from labs done last year. Thanks! -JB

## 2016-10-23 NOTE — Telephone Encounter (Signed)
Patient has been advised. KW 

## 2016-12-11 ENCOUNTER — Ambulatory Visit: Payer: PPO

## 2016-12-11 ENCOUNTER — Other Ambulatory Visit: Payer: PPO

## 2016-12-18 DIAGNOSIS — H2513 Age-related nuclear cataract, bilateral: Secondary | ICD-10-CM | POA: Diagnosis not present

## 2017-01-02 ENCOUNTER — Ambulatory Visit
Admission: RE | Admit: 2017-01-02 | Discharge: 2017-01-02 | Disposition: A | Discharge status: Home / Self Care | Payer: PPO | Source: Ambulatory Visit | Attending: Physician Assistant | Admitting: Physician Assistant

## 2017-01-02 ENCOUNTER — Telehealth: Payer: Self-pay

## 2017-01-02 DIAGNOSIS — M8588 Other specified disorders of bone density and structure, other site: Secondary | ICD-10-CM | POA: Diagnosis not present

## 2017-01-02 DIAGNOSIS — Z13820 Encounter for screening for osteoporosis: Secondary | ICD-10-CM | POA: Insufficient documentation

## 2017-01-02 DIAGNOSIS — Z780 Asymptomatic menopausal state: Secondary | ICD-10-CM

## 2017-01-02 DIAGNOSIS — M8589 Other specified disorders of bone density and structure, multiple sites: Secondary | ICD-10-CM | POA: Diagnosis not present

## 2017-01-02 DIAGNOSIS — Z1239 Encounter for other screening for malignant neoplasm of breast: Secondary | ICD-10-CM

## 2017-01-02 DIAGNOSIS — Z1231 Encounter for screening mammogram for malignant neoplasm of breast: Secondary | ICD-10-CM | POA: Diagnosis not present

## 2017-01-02 DIAGNOSIS — M8555 Aneurysmal bone cyst, thigh: Secondary | ICD-10-CM | POA: Diagnosis not present

## 2017-01-02 DIAGNOSIS — M8580 Other specified disorders of bone density and structure, unspecified site: Secondary | ICD-10-CM

## 2017-01-02 IMAGING — MG MM DIGITAL SCREENING BILAT W/ CAD
5 series · 5 of 5 positions shown · non-contrast
Comparison: Previous exam(s).

CLINICAL DATA: Screening.

EXAM:
DIGITAL SCREENING BILATERAL MAMMOGRAM WITH CAD

[L MLO]
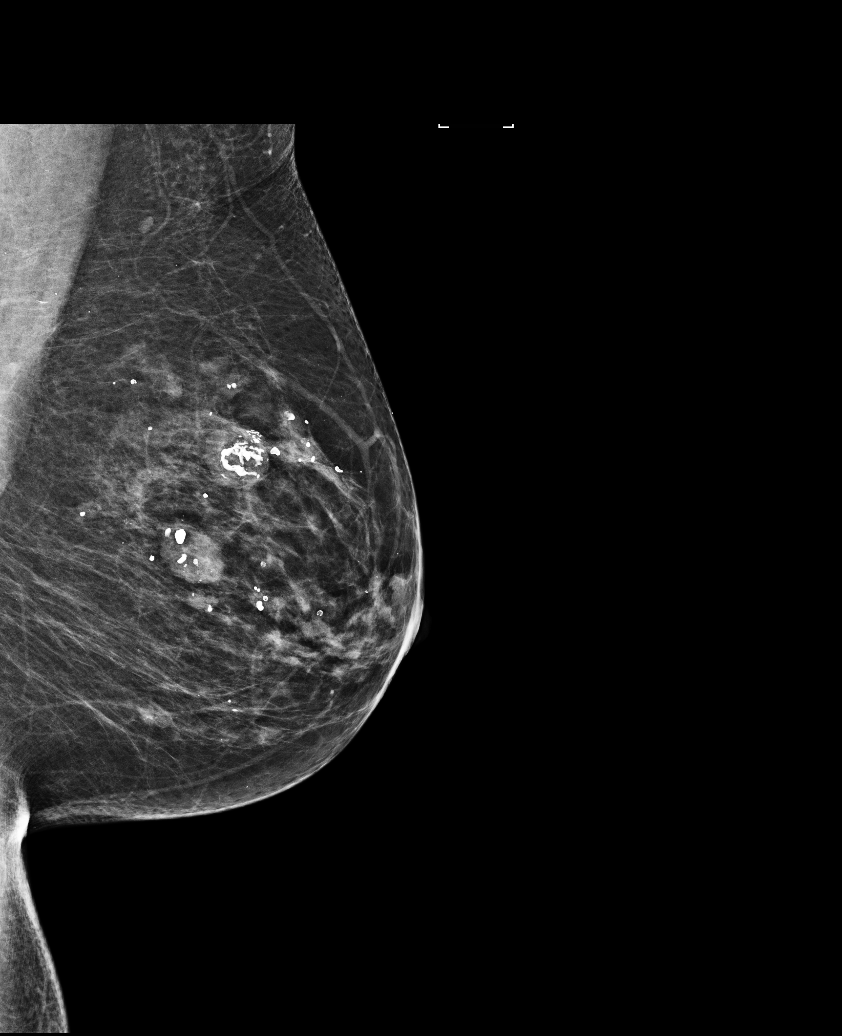

[L CC]
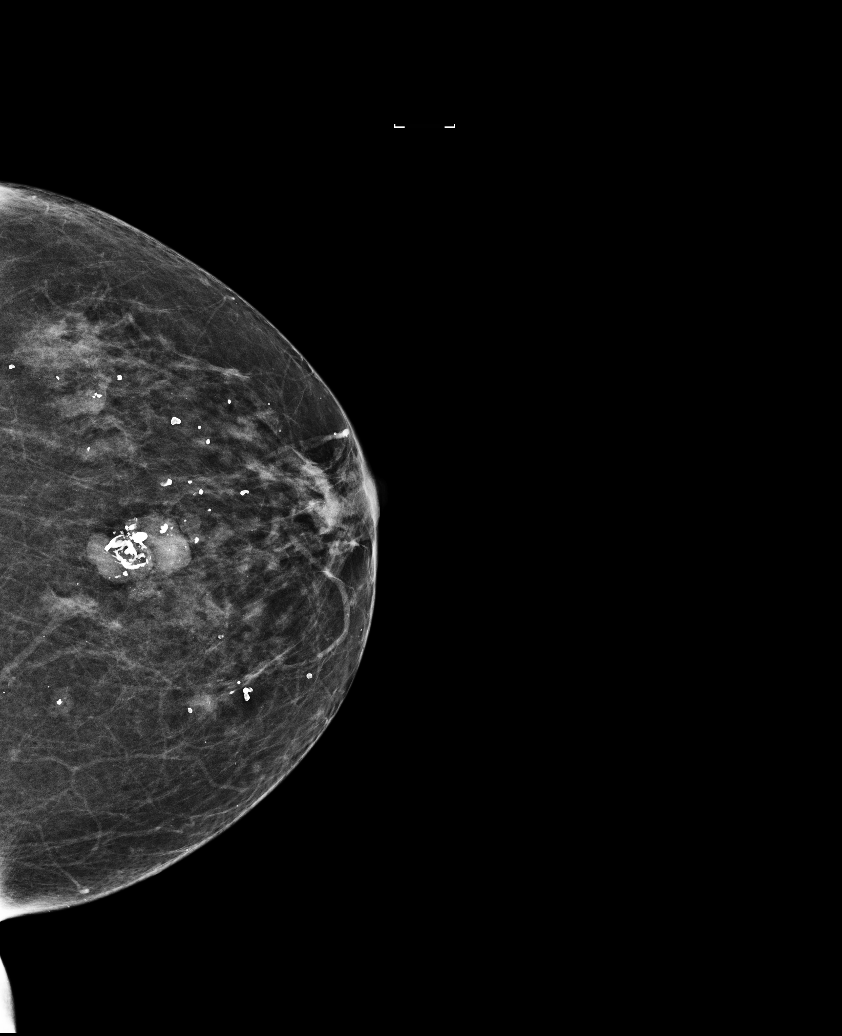

[R MLO (1 of 2)]
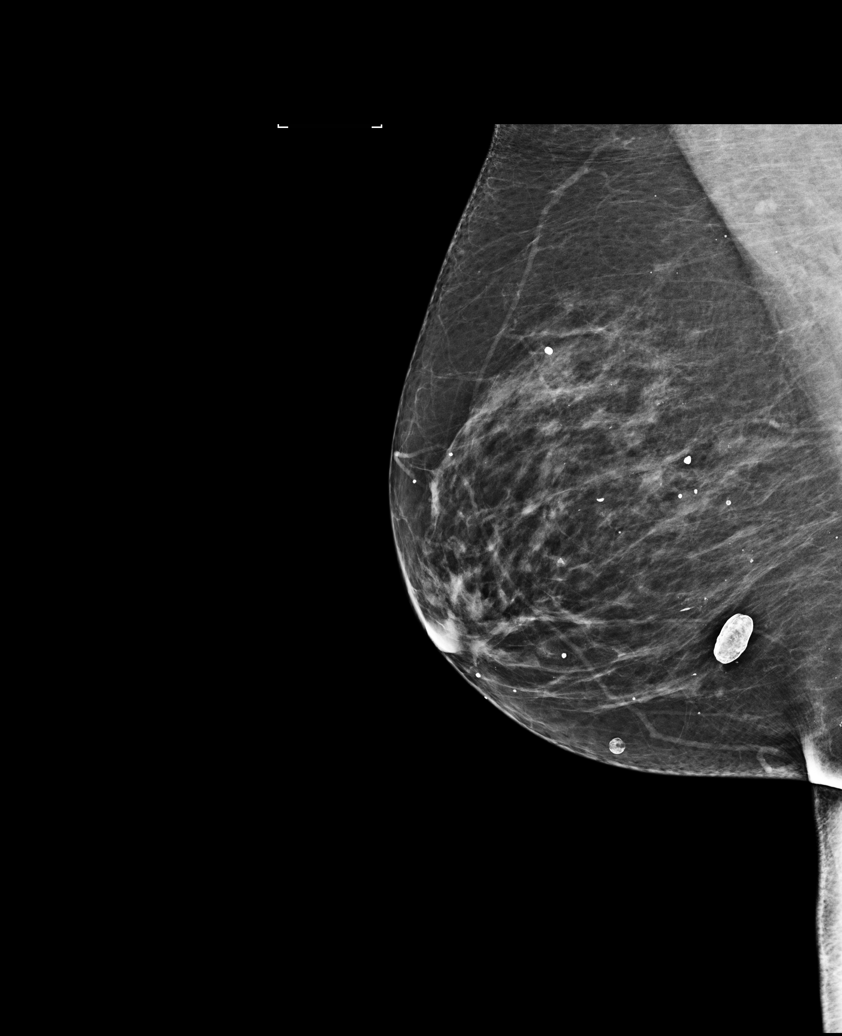

[R MLO (2 of 2)]
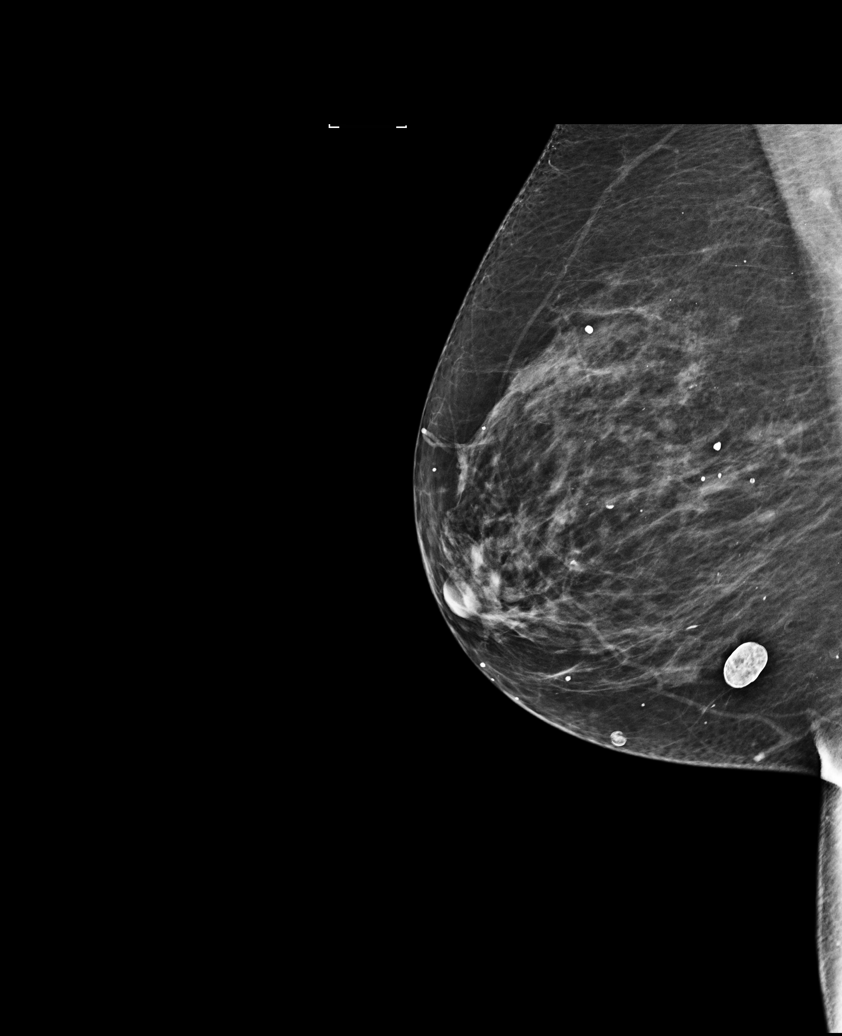

[R CC]
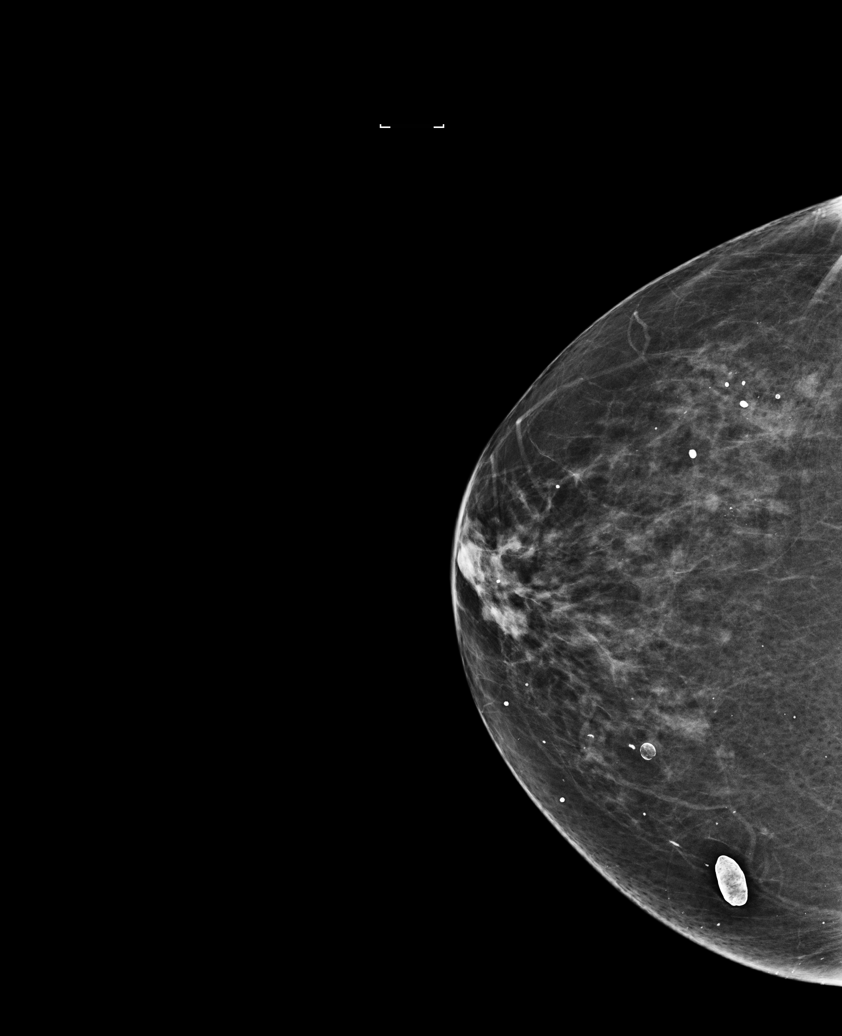

[5 of 5 positions shown; findings below may reference images not displayed]

ACR Breast Density Category b: There are scattered areas of
fibroglandular density.
FINDINGS: There are no findings suspicious for malignancy. Images were
processed with CAD.
IMPRESSION: No mammographic evidence of malignancy. A result letter of this
screening mammogram will be mailed directly to the patient.

RECOMMENDATION:
Screening mammogram in one year. (Code:[US])

BI-RADS CATEGORY  1: Negative.

## 2017-01-02 NOTE — Telephone Encounter (Signed)
No answer, memory is full.  Thanks,  -Joseline

## 2017-01-02 NOTE — Telephone Encounter (Signed)
-----   Message from Margaretann LovelessJennifer M Burnette, PA-C sent at 01/02/2017 11:00 AM EDT ----- Bone density is stable. Still considered osteopenic but no changes need to be made. Continue Vit D and calcium supplements as well as weight bearing exercises to continue to maintain bone health.

## 2017-01-03 ENCOUNTER — Telehealth: Payer: Self-pay

## 2017-01-03 NOTE — Telephone Encounter (Signed)
Patient advised as below. Patient verbalizes understanding and is in agreement with treatment plan.  

## 2017-01-03 NOTE — Telephone Encounter (Signed)
error 

## 2017-03-25 ENCOUNTER — Other Ambulatory Visit: Payer: Self-pay | Admitting: Physician Assistant

## 2017-03-25 DIAGNOSIS — Z87898 Personal history of other specified conditions: Secondary | ICD-10-CM

## 2017-03-25 DIAGNOSIS — I10 Essential (primary) hypertension: Secondary | ICD-10-CM

## 2017-03-25 MED ORDER — ATENOLOL 25 MG PO TABS: 25.0000 mg | ORAL_TABLET | Freq: Two times a day (BID) | ORAL | 3 refills | Status: DC

## 2017-03-25 MED ORDER — OXYBUTYNIN CHLORIDE 5 MG PO TABS: 5.0000 mg | ORAL_TABLET | Freq: Every evening | ORAL | 3 refills | Status: DC

## 2017-03-25 NOTE — Progress Notes (Signed)
[facility] and Oxybutynin. Both refills sent electronically.

## 2017-05-28 HISTORY — PX: HIATAL HERNIA REPAIR: SHX195

## 2017-06-27 ENCOUNTER — Ambulatory Visit
Admission: RE | Admit: 2017-06-27 | Discharge: 2017-06-27 | Disposition: A | Discharge status: Home / Self Care | Payer: PPO | Source: Ambulatory Visit | Attending: Physician Assistant | Admitting: Physician Assistant

## 2017-06-27 ENCOUNTER — Ambulatory Visit (INDEPENDENT_AMBULATORY_CARE_PROVIDER_SITE_OTHER): Payer: PPO | Admitting: Physician Assistant

## 2017-06-27 ENCOUNTER — Encounter: Payer: Self-pay | Admitting: Physician Assistant

## 2017-06-27 VITALS — BP 110/80 | HR 70 | Temp 98.2°F | Resp 16 | Wt 174.0 lb

## 2017-06-27 DIAGNOSIS — J9811 Atelectasis: Secondary | ICD-10-CM | POA: Diagnosis not present

## 2017-06-27 DIAGNOSIS — R059 Cough, unspecified: Secondary | ICD-10-CM

## 2017-06-27 DIAGNOSIS — K449 Diaphragmatic hernia without obstruction or gangrene: Secondary | ICD-10-CM | POA: Diagnosis not present

## 2017-06-27 DIAGNOSIS — J40 Bronchitis, not specified as acute or chronic: Secondary | ICD-10-CM

## 2017-06-27 DIAGNOSIS — R05 Cough: Secondary | ICD-10-CM

## 2017-06-27 IMAGING — CR DG CHEST 2V
1 series · 2 of 2 positions shown · non-contrast
Comparison: [DATE]

CLINICAL DATA: Cough

EXAM:
CHEST  2 VIEW

[Series 1: dg chest 2 view · 0.14mm/px · 2 of 2 slices shown]
[im 1/2]
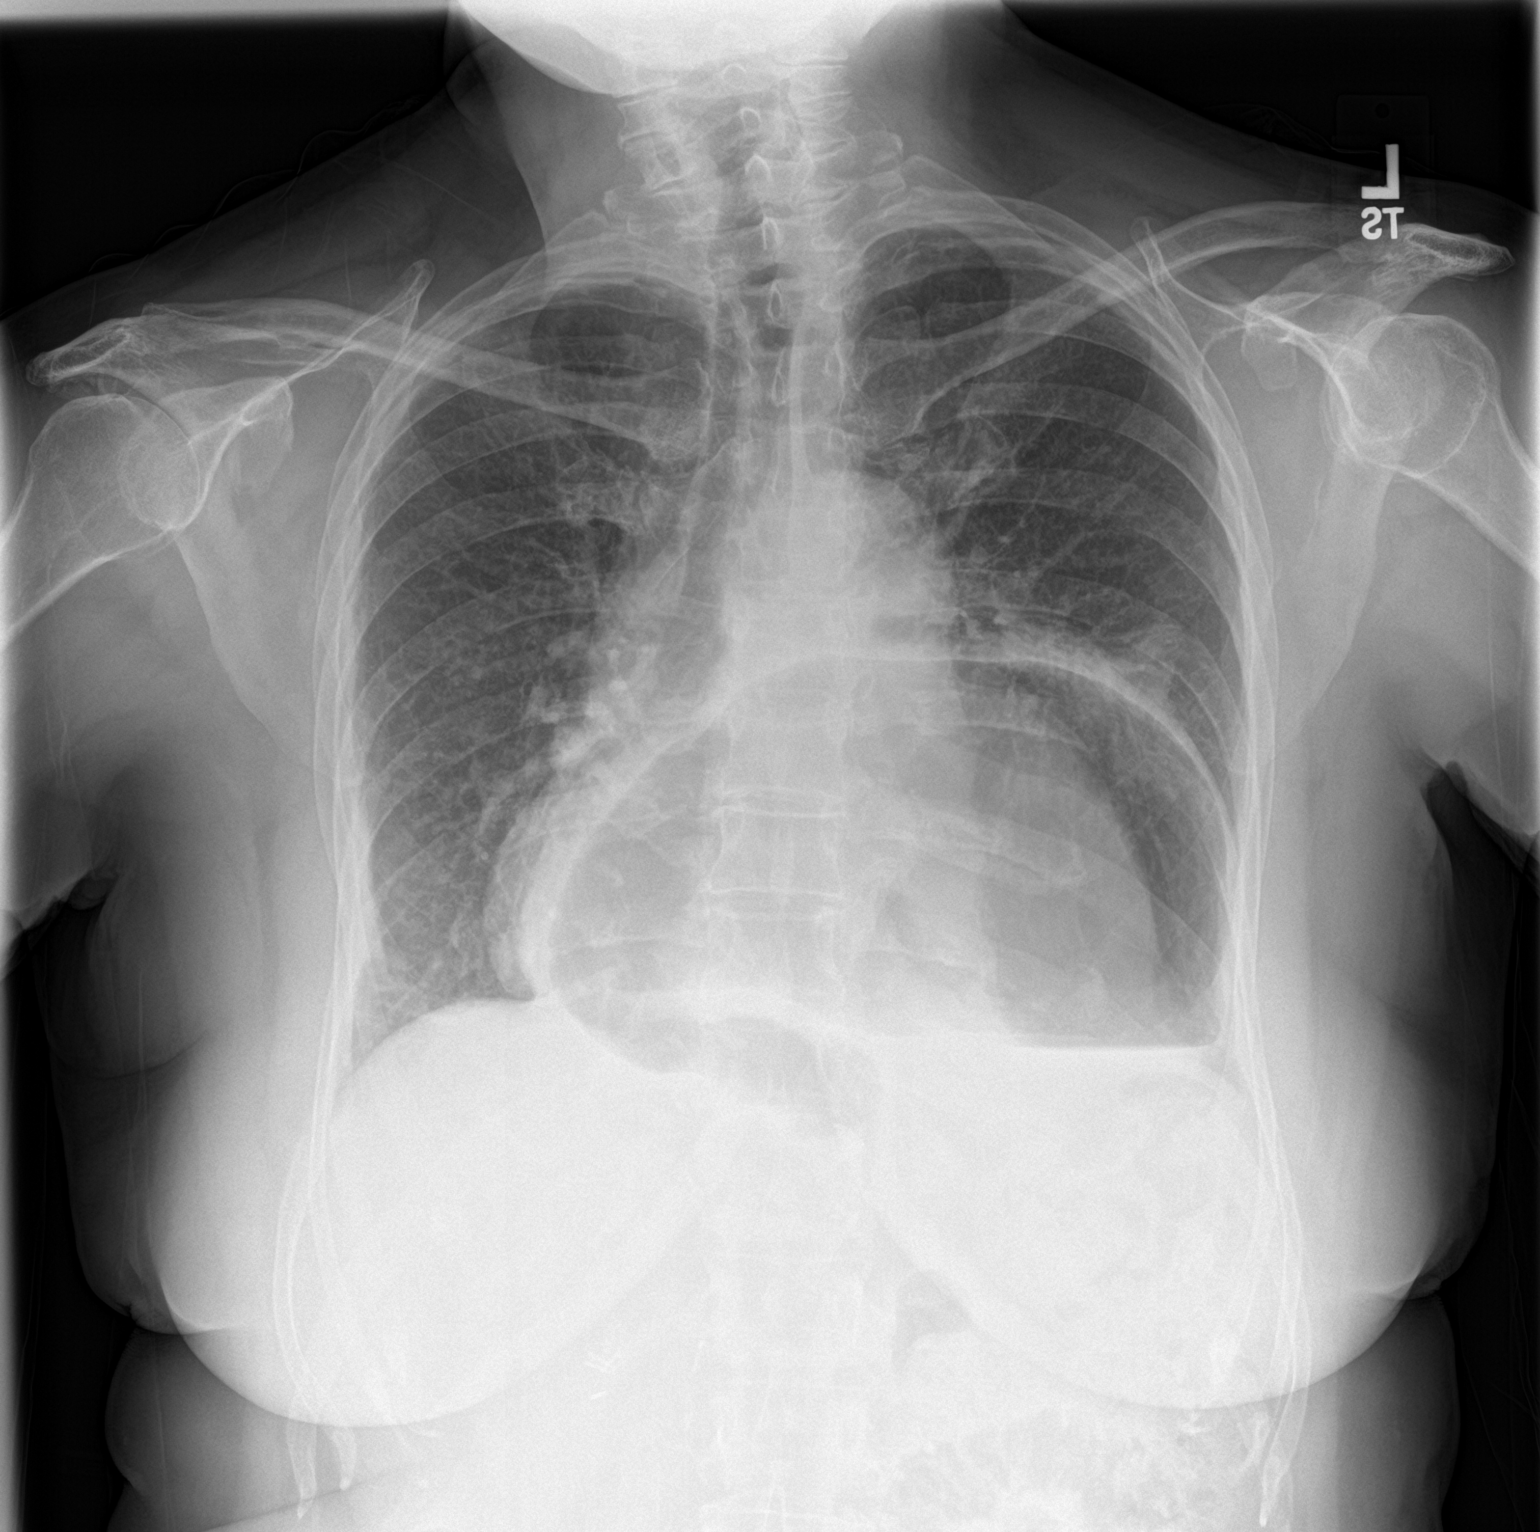
[im 2/2]
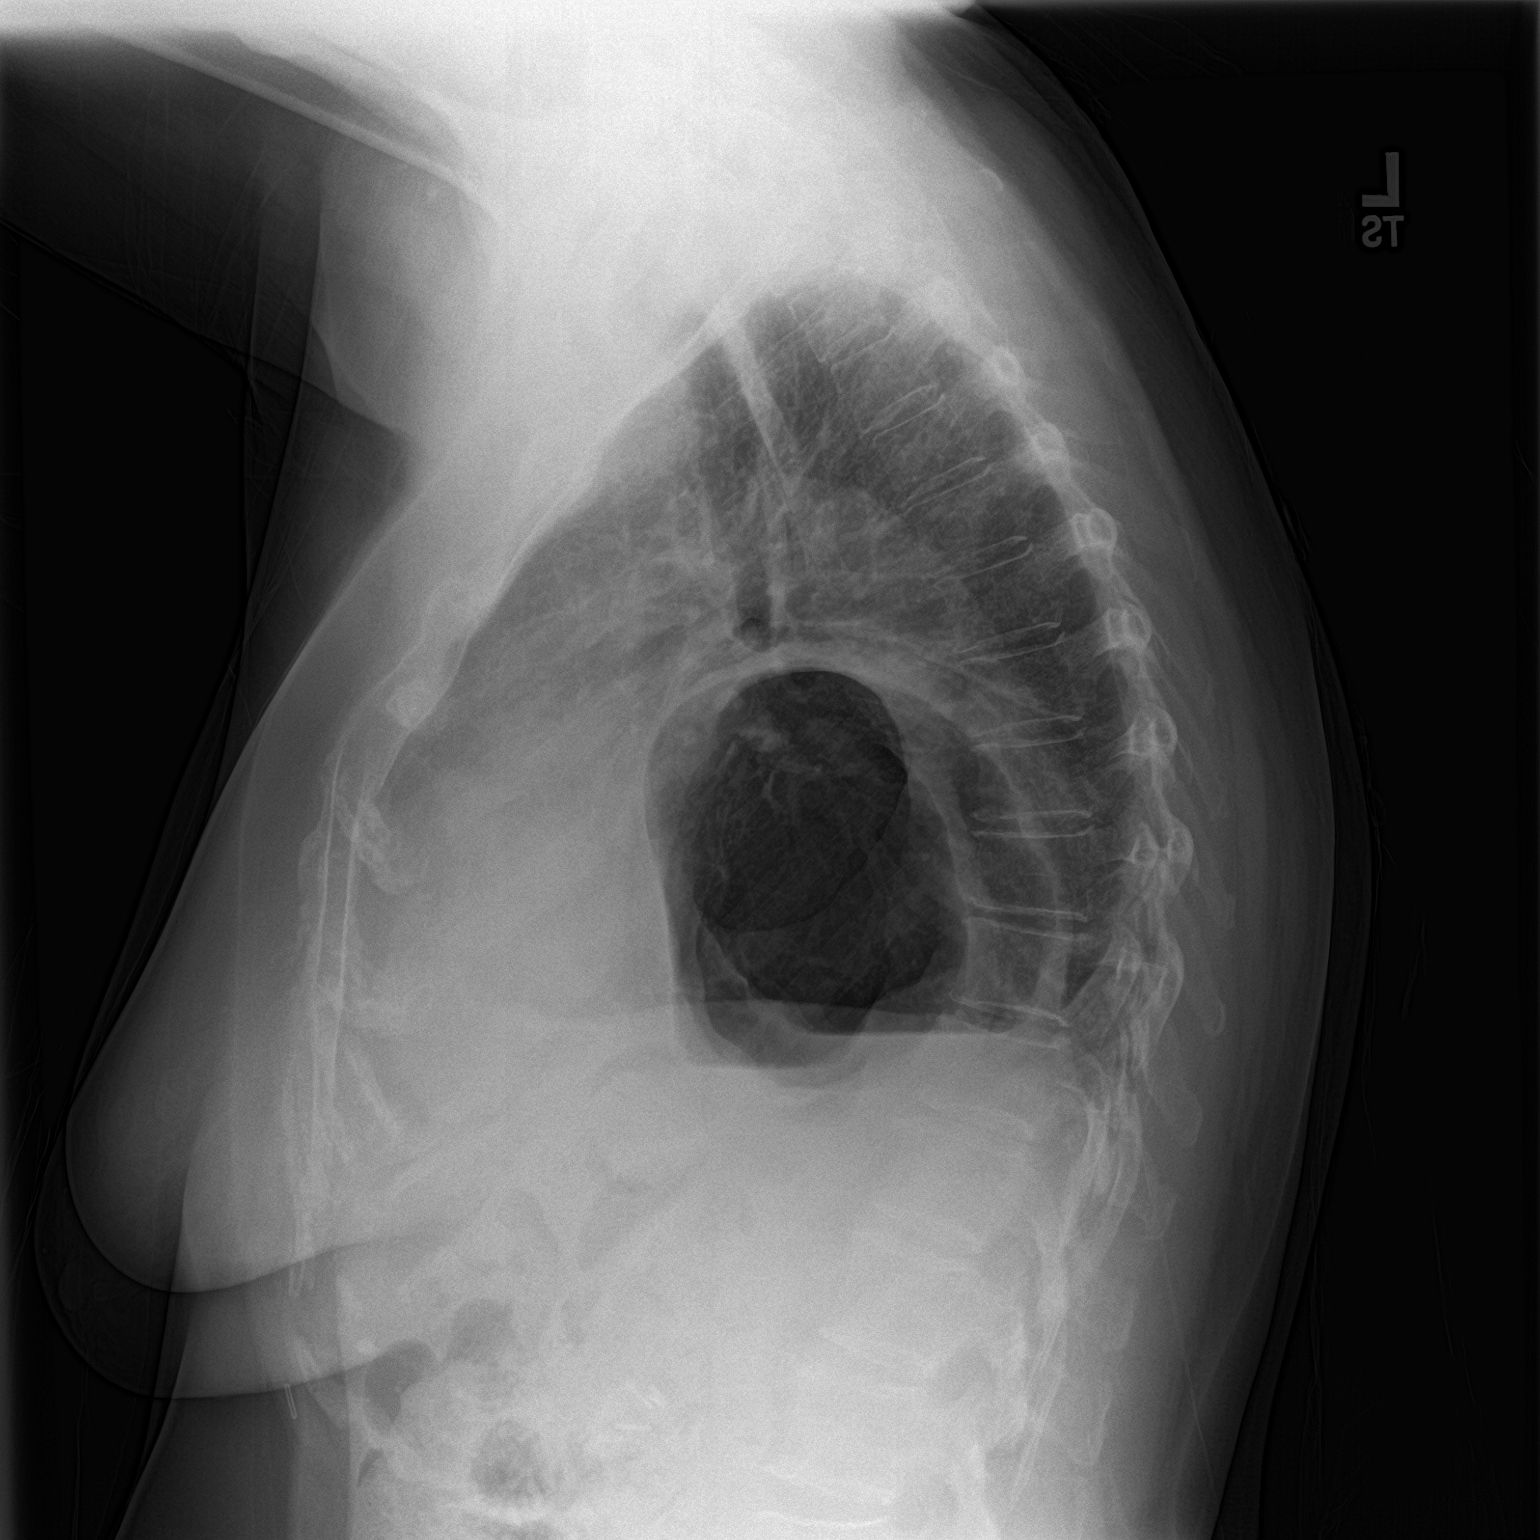

[2 of 2 positions shown; findings below may reference images not displayed]

FINDINGS: The heart size is accentuated by a very large hiatal hernia which
projects over the cardiac silhouette and contains an air-fluid level
in the stomach. There is associated subsegmental atelectasis in the
lower lobes. No definite new consolidation or mass. Normal
vascularity. No pneumothorax.
IMPRESSION: Large hiatal hernia with associated basilar atelectasis.

## 2017-06-27 MED ORDER — ALBUTEROL SULFATE HFA 108 (90 BASE) MCG/ACT IN AERS: 2.0000 | INHALATION_SPRAY | Freq: Four times a day (QID) | RESPIRATORY_TRACT | 0 refills | Status: DC | PRN

## 2017-06-27 MED ORDER — DOXYCYCLINE HYCLATE 100 MG PO TABS: 100.0000 mg | ORAL_TABLET | Freq: Two times a day (BID) | ORAL | 0 refills | Status: DC

## 2017-06-27 NOTE — Progress Notes (Signed)
Patient: Jessica Little Female    DOB: 1946/05/12   72 y.o.   MRN: [id number] Visit Date: 06/27/2017  Today's Provider: Margaretann Loveless, PA-C   Chief Complaint  Patient presents with  . URI   Subjective:    HPI Upper Respiratory Infection: Patient complains of symptoms of a URI. Symptoms include cough. Onset of symptoms was several days ago, gradually worsening since that time. She also c/o non productive cough for the past 2 days .  She is drinking plenty of fluids. Evaluation to date: none. Treatment to date: cough suppressants.     No Known Allergies   Current Outpatient Medications:  .  aspirin 81 MG tablet, Take 81 mg by mouth daily. , Disp: , Rfl:  .  atenolol (TENORMIN) 25 MG tablet, Take 1 tablet (25 mg total) by mouth 2 (two) times daily., Disp: 180 tablet, Rfl: 3 .  Ca Phosphate-Cholecalciferol (CALCIUM 500 + D3) 250-500 MG-UNIT CHEW, Chew 2 capsules by mouth daily., Disp: , Rfl:  .  CALCIUM CARBONATE-VIT D-MIN PO, Take by mouth., Disp: , Rfl:  .  LORazepam (ATIVAN) 1 MG tablet, Take 0.5 mg by mouth every 6 (six) hours as needed. , Disp: , Rfl:  .  Melatonin 5 MG CAPS, Take by mouth at bedtime as needed. , Disp: , Rfl:  .  nortriptyline (PAMELOR) 50 MG capsule, Take 50 mg by mouth 2 (two) times daily. , Disp: , Rfl:  .  omeprazole (PRILOSEC) 40 MG capsule, Take 40 mg by mouth daily. Reported on 08/23/2015, Disp: , Rfl:  .  oxybutynin (DITROPAN) 5 MG tablet, Take 1 tablet (5 mg total) by mouth every evening., Disp: 90 tablet, Rfl: 3 .  perphenazine (TRILAFON) 8 MG tablet, Take 8 mg by mouth at bedtime. , Disp: , Rfl:  .  simvastatin (ZOCOR) 20 MG tablet, Take 1 tablet (20 mg total) by mouth daily., Disp: 90 tablet, Rfl: 3 .  vitamin E 1000 UNIT capsule, Take by mouth., Disp: , Rfl:   Review of Systems  Constitutional: Negative.   HENT: Negative.   Respiratory: Positive for cough and chest tightness. Negative for shortness of breath and wheezing.     Cardiovascular: Negative.   Gastrointestinal: Negative for abdominal pain.  Neurological: Negative.     Social History   Tobacco Use  . Smoking status: Never Smoker  . Smokeless tobacco: Never Used  Substance Use Topics  . Alcohol use: No   Objective:   BP 110/80 (BP Location: Left Arm, Patient Position: Sitting, Cuff Size: Normal)   Pulse 70   Temp 98.2 F (36.8 C) (Oral)   Resp 16   Wt 174 lb (78.9 kg)   SpO2 98%   BMI 28.08 kg/m  Vitals:   06/27/17 1542  BP: 110/80  Pulse: 70  Resp: 16  Temp: 98.2 F (36.8 C)  TempSrc: Oral  SpO2: 98%  Weight: 174 lb (78.9 kg)     Physical Exam  Constitutional: She appears well-developed and well-nourished. No distress.  HENT:  Head: Normocephalic and atraumatic.  Right Ear: Hearing, tympanic membrane, external ear and ear canal normal.  Left Ear: Hearing, tympanic membrane, external ear and ear canal normal.  Nose: Nose normal.  Mouth/Throat: Uvula is midline, oropharynx is clear and moist and mucous membranes are normal. No oropharyngeal exudate.  Eyes: Conjunctivae are normal. Pupils are equal, round, and reactive to light. Right eye exhibits no discharge. Left eye exhibits no discharge. No scleral icterus.  Neck: Normal range of motion. Neck supple. No tracheal deviation present. No thyromegaly present.  Cardiovascular: Normal rate, regular rhythm and normal heart sounds. Exam reveals no gallop and no friction rub.  No murmur heard. Pulmonary/Chest: Effort normal. No stridor. No respiratory distress. She has decreased breath sounds. She has wheezes in the right lower field, the left upper field and the left lower field. She has rales in the left lower field.  Lymphadenopathy:    She has no cervical adenopathy.  Skin: Skin is warm and dry. She is not diaphoretic.  Vitals reviewed.       Assessment & Plan:     1. Bronchitis Suspect bronchitis but highly suspicious for pneumonia due to rales in LLL. Wheezes heard in  multiple areas. Will get CXR to r/o pneumonia. I will treat with albuterol inhaler and doxycycline as below. I will f/u with patient pending CXR results. Patient is to continue Robitussin DM for cough. Call if symptoms worsen.  - albuterol (PROVENTIL HFA;VENTOLIN HFA) 108 (90 Base) MCG/ACT inhaler; Inhale 2 puffs into the lungs every 6 (six) hours as needed for wheezing or shortness of breath.  Dispense: 1 Inhaler; Refill: 0 - doxycycline (VIBRA-TABS) 100 MG tablet; Take 1 tablet (100 mg total) by mouth 2 (two) times daily.  Dispense: 20 tablet; Refill: 0  2. Cough See above medical treatment plan. - DG Chest 2 View; Future       Margaretann LovelessJennifer M Burnette, PA-C  [facility]

## 2017-06-28 ENCOUNTER — Telehealth: Payer: Self-pay

## 2017-06-28 NOTE — Telephone Encounter (Signed)
Patient advised as below. Patient verbalizes understanding and is in agreement with treatment plan.  

## 2017-06-28 NOTE — Telephone Encounter (Signed)
-----   Message from Margaretann LovelessJennifer M Burnette, PA-C sent at 06/28/2017  8:32 AM EST ----- No pneumonia noted. Continue albuterol inhaler and doxycycline as below. Call if symptoms worsen.

## 2017-07-26 ENCOUNTER — Other Ambulatory Visit: Payer: Self-pay

## 2017-07-26 ENCOUNTER — Other Ambulatory Visit: Payer: Self-pay | Admitting: Physician Assistant

## 2017-07-26 DIAGNOSIS — E7800 Pure hypercholesterolemia, unspecified: Secondary | ICD-10-CM

## 2017-07-26 MED ORDER — SIMVASTATIN 20 MG PO TABS: 20.0000 mg | ORAL_TABLET | Freq: Every day | ORAL | 3 refills | Status: DC

## 2017-07-26 NOTE — Progress Notes (Signed)
Simvastatin refilled. 

## 2017-09-05 ENCOUNTER — Encounter: Payer: Self-pay | Admitting: *Deleted

## 2017-09-05 ENCOUNTER — Other Ambulatory Visit: Payer: Self-pay

## 2017-09-05 ENCOUNTER — Emergency Department
Admission: EM | Admit: 2017-09-05 | Discharge: 2017-09-06 | Disposition: A | Discharge status: Home / Self Care | Payer: PPO | Attending: Emergency Medicine | Admitting: Emergency Medicine

## 2017-09-05 DIAGNOSIS — Z79899 Other long term (current) drug therapy: Secondary | ICD-10-CM | POA: Insufficient documentation

## 2017-09-05 DIAGNOSIS — R911 Solitary pulmonary nodule: Secondary | ICD-10-CM | POA: Diagnosis not present

## 2017-09-05 DIAGNOSIS — F419 Anxiety disorder, unspecified: Secondary | ICD-10-CM | POA: Insufficient documentation

## 2017-09-05 DIAGNOSIS — K440 Diaphragmatic hernia with obstruction, without gangrene: Secondary | ICD-10-CM

## 2017-09-05 DIAGNOSIS — R109 Unspecified abdominal pain: Secondary | ICD-10-CM | POA: Diagnosis not present

## 2017-09-05 DIAGNOSIS — K311 Adult hypertrophic pyloric stenosis: Secondary | ICD-10-CM | POA: Insufficient documentation

## 2017-09-05 DIAGNOSIS — R1013 Epigastric pain: Secondary | ICD-10-CM | POA: Diagnosis not present

## 2017-09-05 DIAGNOSIS — R1084 Generalized abdominal pain: Secondary | ICD-10-CM | POA: Diagnosis not present

## 2017-09-05 DIAGNOSIS — Z9049 Acquired absence of other specified parts of digestive tract: Secondary | ICD-10-CM | POA: Diagnosis not present

## 2017-09-05 DIAGNOSIS — Z7982 Long term (current) use of aspirin: Secondary | ICD-10-CM | POA: Diagnosis not present

## 2017-09-05 DIAGNOSIS — I10 Essential (primary) hypertension: Secondary | ICD-10-CM | POA: Diagnosis not present

## 2017-09-05 LAB — CBC
HCT: 42.3 % (ref 35.0–47.0)
HEMOGLOBIN: 14.4 g/dL (ref 12.0–16.0)
MCH: 27.2 pg (ref 26.0–34.0)
MCHC: 34 g/dL (ref 32.0–36.0)
MCV: 80 fL (ref 80.0–100.0)
Platelets: 266 10*3/uL (ref 150–440)
RBC: 5.29 MIL/uL — AB (ref 3.80–5.20)
RDW: 15.9 % — ABNORMAL HIGH (ref 11.5–14.5)
WBC: 11.1 10*3/uL — AB (ref 3.6–11.0)

## 2017-09-05 LAB — COMPREHENSIVE METABOLIC PANEL
ALK PHOS: 72 U/L (ref 38–126)
ALT: 20 U/L (ref 14–54)
ANION GAP: 12 (ref 5–15)
AST: 36 U/L (ref 15–41)
Albumin: 4.7 g/dL (ref 3.5–5.0)
BUN: 17 mg/dL (ref 6–20)
CO2: 33 mmol/L — AB (ref 22–32)
Calcium: 9.8 mg/dL (ref 8.9–10.3)
Chloride: 97 mmol/L — ABNORMAL LOW (ref 101–111)
Creatinine, Ser: 1.4 mg/dL — ABNORMAL HIGH (ref 0.44–1.00)
GFR, EST AFRICAN AMERICAN: 43 mL/min — AB (ref >60)
GFR, EST NON AFRICAN AMERICAN: 37 mL/min — AB (ref >60)
Glucose, Bld: 187 mg/dL — ABNORMAL HIGH (ref 65–99)
Potassium: 3.3 mmol/L — ABNORMAL LOW (ref 3.5–5.1)
SODIUM: 142 mmol/L (ref 135–145)
Total Bilirubin: 0.9 mg/dL (ref 0.3–1.2)
Total Protein: 8.7 g/dL — ABNORMAL HIGH (ref 6.5–8.1)

## 2017-09-05 LAB — TROPONIN I

## 2017-09-05 LAB — LIPASE, BLOOD: LIPASE: 29 U/L (ref 11–51)

## 2017-09-05 MED ORDER — MORPHINE SULFATE (PF) 4 MG/ML IV SOLN
INTRAVENOUS | Status: AC
  Filled 2017-09-05: qty 1

## 2017-09-05 MED ORDER — MORPHINE SULFATE (PF) 4 MG/ML IV SOLN
INTRAVENOUS | Status: AC
  Administered 2017-09-06: 4 mg via INTRAVENOUS
  Filled 2017-09-05: qty 1

## 2017-09-05 MED ORDER — ONDANSETRON HCL 4 MG/2ML IJ SOLN
INTRAMUSCULAR | Status: AC
  Administered 2017-09-06: 4 mg via INTRAVENOUS
  Filled 2017-09-05: qty 2

## 2017-09-05 MED ORDER — ONDANSETRON HCL 4 MG/2ML IJ SOLN
4.0000 mg | Freq: Once | INTRAMUSCULAR | Status: AC | PRN
  Administered 2017-09-05: 4 mg via INTRAVENOUS
  Filled 2017-09-05: qty 2

## 2017-09-05 NOTE — ED Triage Notes (Signed)
Pt presents from home, c/o abdominal pain since 1400 today. Pt vomited BRB x 1 at 1400. Pt vomiting too many to count since 1400. Pt states initial vomit yellow, becoming darker over time. Pt takes ASA 81 mg. Pt denies hx of beeding ulcers. Pt denies sxs of lower GI bleed. Pt denies hx of DM and pancreatitis. Pt c/o epigastric pain that is slightly tender to palpation.

## 2017-09-06 ENCOUNTER — Encounter: Payer: Self-pay | Admitting: Radiology

## 2017-09-06 ENCOUNTER — Emergency Department: Payer: PPO

## 2017-09-06 DIAGNOSIS — E860 Dehydration: Secondary | ICD-10-CM | POA: Diagnosis not present

## 2017-09-06 DIAGNOSIS — R109 Unspecified abdominal pain: Secondary | ICD-10-CM | POA: Diagnosis not present

## 2017-09-06 DIAGNOSIS — J951 Acute pulmonary insufficiency following thoracic surgery: Secondary | ICD-10-CM | POA: Diagnosis not present

## 2017-09-06 DIAGNOSIS — L0291 Cutaneous abscess, unspecified: Secondary | ICD-10-CM | POA: Diagnosis not present

## 2017-09-06 DIAGNOSIS — K219 Gastro-esophageal reflux disease without esophagitis: Secondary | ICD-10-CM | POA: Diagnosis not present

## 2017-09-06 DIAGNOSIS — E8770 Fluid overload, unspecified: Secondary | ICD-10-CM | POA: Diagnosis not present

## 2017-09-06 DIAGNOSIS — I10 Essential (primary) hypertension: Secondary | ICD-10-CM | POA: Diagnosis not present

## 2017-09-06 DIAGNOSIS — I313 Pericardial effusion (noninflammatory): Secondary | ICD-10-CM | POA: Diagnosis not present

## 2017-09-06 DIAGNOSIS — Q401 Congenital hiatus hernia: Secondary | ICD-10-CM | POA: Diagnosis not present

## 2017-09-06 DIAGNOSIS — F418 Other specified anxiety disorders: Secondary | ICD-10-CM | POA: Diagnosis not present

## 2017-09-06 DIAGNOSIS — J90 Pleural effusion, not elsewhere classified: Secondary | ICD-10-CM | POA: Diagnosis not present

## 2017-09-06 DIAGNOSIS — Z4682 Encounter for fitting and adjustment of non-vascular catheter: Secondary | ICD-10-CM | POA: Diagnosis not present

## 2017-09-06 DIAGNOSIS — J439 Emphysema, unspecified: Secondary | ICD-10-CM | POA: Diagnosis not present

## 2017-09-06 DIAGNOSIS — F259 Schizoaffective disorder, unspecified: Secondary | ICD-10-CM | POA: Diagnosis not present

## 2017-09-06 DIAGNOSIS — K3184 Gastroparesis: Secondary | ICD-10-CM | POA: Diagnosis not present

## 2017-09-06 DIAGNOSIS — K311 Adult hypertrophic pyloric stenosis: Secondary | ICD-10-CM | POA: Diagnosis not present

## 2017-09-06 DIAGNOSIS — K449 Diaphragmatic hernia without obstruction or gangrene: Secondary | ICD-10-CM | POA: Diagnosis not present

## 2017-09-06 DIAGNOSIS — F419 Anxiety disorder, unspecified: Secondary | ICD-10-CM | POA: Diagnosis not present

## 2017-09-06 DIAGNOSIS — R110 Nausea: Secondary | ICD-10-CM | POA: Diagnosis not present

## 2017-09-06 DIAGNOSIS — F05 Delirium due to known physiological condition: Secondary | ICD-10-CM | POA: Diagnosis not present

## 2017-09-06 DIAGNOSIS — J9811 Atelectasis: Secondary | ICD-10-CM | POA: Diagnosis not present

## 2017-09-06 DIAGNOSIS — I517 Cardiomegaly: Secondary | ICD-10-CM | POA: Diagnosis not present

## 2017-09-06 DIAGNOSIS — R911 Solitary pulmonary nodule: Secondary | ICD-10-CM | POA: Diagnosis not present

## 2017-09-06 DIAGNOSIS — Z452 Encounter for adjustment and management of vascular access device: Secondary | ICD-10-CM | POA: Diagnosis not present

## 2017-09-06 DIAGNOSIS — R4589 Other symptoms and signs involving emotional state: Secondary | ICD-10-CM | POA: Diagnosis not present

## 2017-09-06 DIAGNOSIS — I499 Cardiac arrhythmia, unspecified: Secondary | ICD-10-CM | POA: Diagnosis not present

## 2017-09-06 DIAGNOSIS — F251 Schizoaffective disorder, depressive type: Secondary | ICD-10-CM | POA: Diagnosis not present

## 2017-09-06 DIAGNOSIS — Z931 Gastrostomy status: Secondary | ICD-10-CM | POA: Diagnosis not present

## 2017-09-06 DIAGNOSIS — K440 Diaphragmatic hernia with obstruction, without gangrene: Secondary | ICD-10-CM | POA: Diagnosis not present

## 2017-09-06 DIAGNOSIS — R079 Chest pain, unspecified: Secondary | ICD-10-CM | POA: Diagnosis not present

## 2017-09-06 DIAGNOSIS — M6281 Muscle weakness (generalized): Secondary | ICD-10-CM | POA: Diagnosis not present

## 2017-09-06 DIAGNOSIS — N179 Acute kidney failure, unspecified: Secondary | ICD-10-CM | POA: Diagnosis not present

## 2017-09-06 DIAGNOSIS — F339 Major depressive disorder, recurrent, unspecified: Secondary | ICD-10-CM | POA: Diagnosis not present

## 2017-09-06 DIAGNOSIS — Z934 Other artificial openings of gastrointestinal tract status: Secondary | ICD-10-CM | POA: Diagnosis not present

## 2017-09-06 DIAGNOSIS — R633 Feeding difficulties: Secondary | ICD-10-CM | POA: Diagnosis not present

## 2017-09-06 DIAGNOSIS — T797XXA Traumatic subcutaneous emphysema, initial encounter: Secondary | ICD-10-CM | POA: Diagnosis not present

## 2017-09-06 DIAGNOSIS — K9423 Gastrostomy malfunction: Secondary | ICD-10-CM | POA: Diagnosis not present

## 2017-09-06 DIAGNOSIS — R627 Adult failure to thrive: Secondary | ICD-10-CM | POA: Diagnosis not present

## 2017-09-06 DIAGNOSIS — E559 Vitamin D deficiency, unspecified: Secondary | ICD-10-CM | POA: Diagnosis not present

## 2017-09-06 DIAGNOSIS — Z87898 Personal history of other specified conditions: Secondary | ICD-10-CM | POA: Diagnosis not present

## 2017-09-06 DIAGNOSIS — Z4889 Encounter for other specified surgical aftercare: Secondary | ICD-10-CM | POA: Diagnosis not present

## 2017-09-06 DIAGNOSIS — R451 Restlessness and agitation: Secondary | ICD-10-CM | POA: Diagnosis not present

## 2017-09-06 DIAGNOSIS — R918 Other nonspecific abnormal finding of lung field: Secondary | ICD-10-CM | POA: Diagnosis not present

## 2017-09-06 DIAGNOSIS — R262 Difficulty in walking, not elsewhere classified: Secondary | ICD-10-CM | POA: Diagnosis not present

## 2017-09-06 DIAGNOSIS — R0902 Hypoxemia: Secondary | ICD-10-CM | POA: Diagnosis not present

## 2017-09-06 DIAGNOSIS — J952 Acute pulmonary insufficiency following nonthoracic surgery: Secondary | ICD-10-CM | POA: Diagnosis not present

## 2017-09-06 DIAGNOSIS — G4700 Insomnia, unspecified: Secondary | ICD-10-CM | POA: Diagnosis not present

## 2017-09-06 DIAGNOSIS — I4949 Other premature depolarization: Secondary | ICD-10-CM | POA: Diagnosis not present

## 2017-09-06 DIAGNOSIS — F329 Major depressive disorder, single episode, unspecified: Secondary | ICD-10-CM | POA: Diagnosis not present

## 2017-09-06 DIAGNOSIS — R7989 Other specified abnormal findings of blood chemistry: Secondary | ICD-10-CM | POA: Diagnosis not present

## 2017-09-06 DIAGNOSIS — K458 Other specified abdominal hernia without obstruction or gangrene: Secondary | ICD-10-CM | POA: Diagnosis not present

## 2017-09-06 DIAGNOSIS — Z9889 Other specified postprocedural states: Secondary | ICD-10-CM | POA: Diagnosis not present

## 2017-09-06 DIAGNOSIS — R509 Fever, unspecified: Secondary | ICD-10-CM | POA: Diagnosis not present

## 2017-09-06 DIAGNOSIS — D62 Acute posthemorrhagic anemia: Secondary | ICD-10-CM | POA: Diagnosis not present

## 2017-09-06 DIAGNOSIS — R410 Disorientation, unspecified: Secondary | ICD-10-CM | POA: Diagnosis not present

## 2017-09-06 DIAGNOSIS — R0989 Other specified symptoms and signs involving the circulatory and respiratory systems: Secondary | ICD-10-CM | POA: Diagnosis not present

## 2017-09-06 LAB — URINALYSIS, COMPLETE (UACMP) WITH MICROSCOPIC
BACTERIA UA: NONE SEEN
BILIRUBIN URINE: NEGATIVE
GLUCOSE, UA: 150 mg/dL — AB
HGB URINE DIPSTICK: NEGATIVE
KETONES UR: 5 mg/dL — AB
LEUKOCYTES UA: NEGATIVE
NITRITE: NEGATIVE
PH: 8 (ref 5.0–8.0)
PROTEIN: 30 mg/dL — AB
SQUAMOUS EPITHELIAL / LPF: NONE SEEN
Specific Gravity, Urine: 1.032 — ABNORMAL HIGH (ref 1.005–1.030)

## 2017-09-06 LAB — LACTIC ACID, PLASMA: Lactic Acid, Venous: 2.3 mmol/L (ref 0.5–1.9)

## 2017-09-06 IMAGING — CT CT ABD-PELV W/ CM
2 of 5 series · 15 of 46 positions shown, 17 images · IV contrast (APPLIED)
Comparison: [DATE] CT, CXR [DATE]

CLINICAL DATA: Abdominal pain since [7Y] hours with hematemesis x1.

EXAM:
CT ABDOMEN AND PELVIS WITH CONTRAST
TECHNIQUE: Multidetector CT imaging of the abdomen and pelvis was performed
using the standard protocol following bolus administration of
intravenous contrast.
CONTRAST:  75mL [7Y] IOPAMIDOL ([7Y]) INJECTION 61%

[Series 2: axial st · axial · 0.83mm/px · z∈[-972,-567]mm · 12 of 93 slices shown, 14 images]
[im 6/93  soft-tissue]
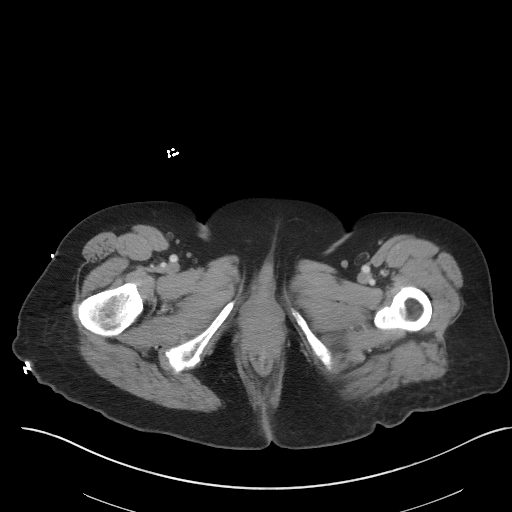
[im 6/93  bone]
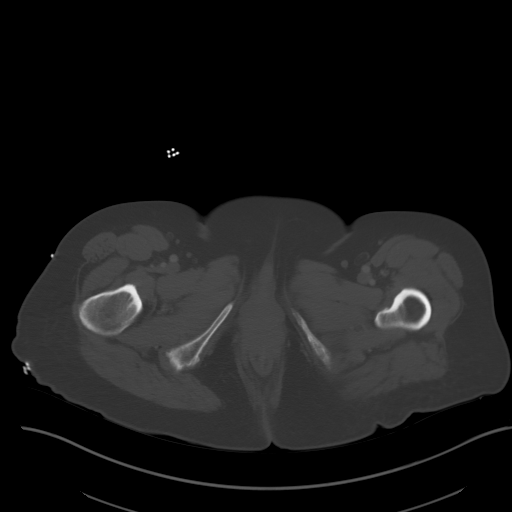
[im 17/93  soft-tissue]
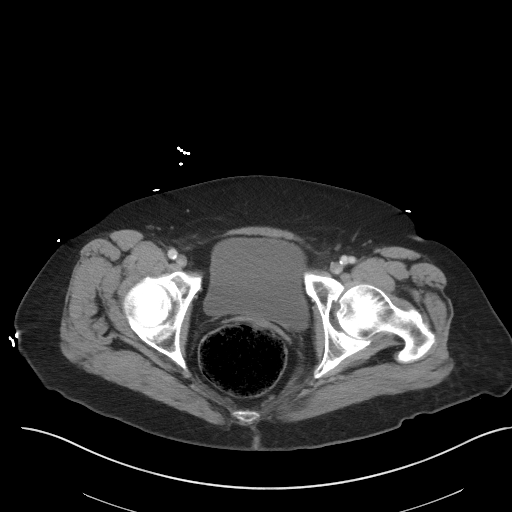
[im 22/93  soft-tissue]
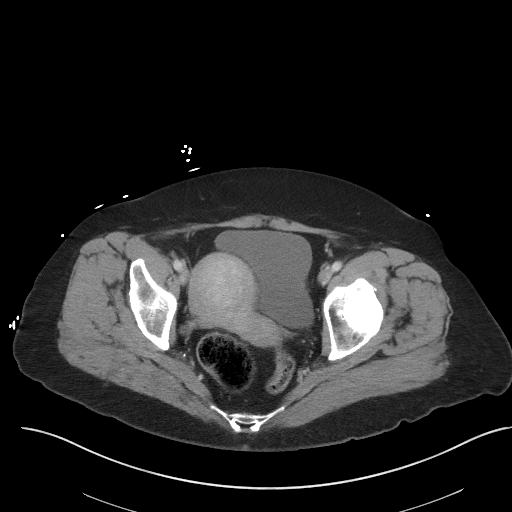
[im 28/93  soft-tissue]
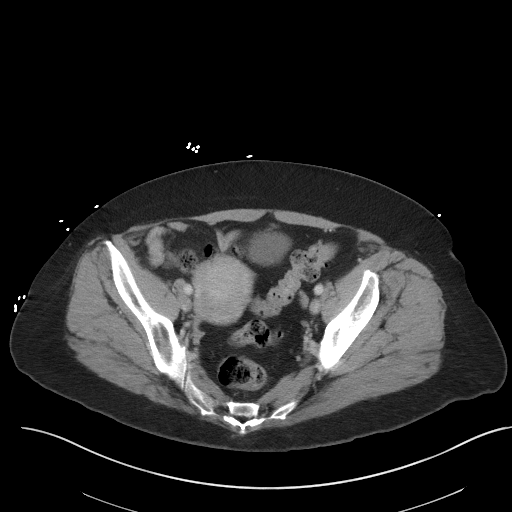
[im 38/93  soft-tissue]
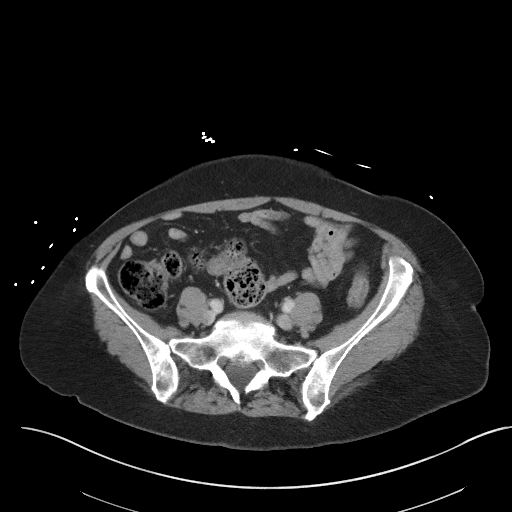
[im 44/93  soft-tissue]
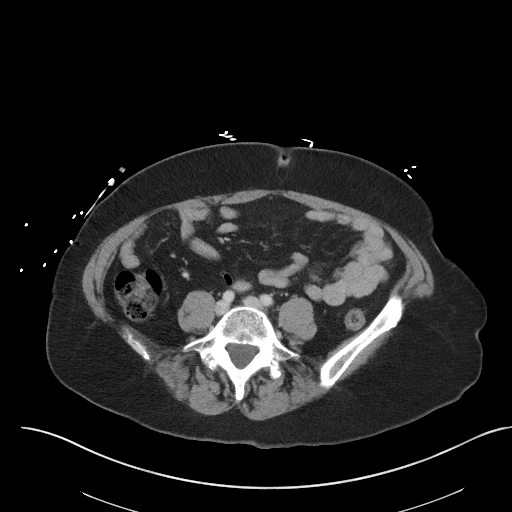
[im 49/93  soft-tissue]
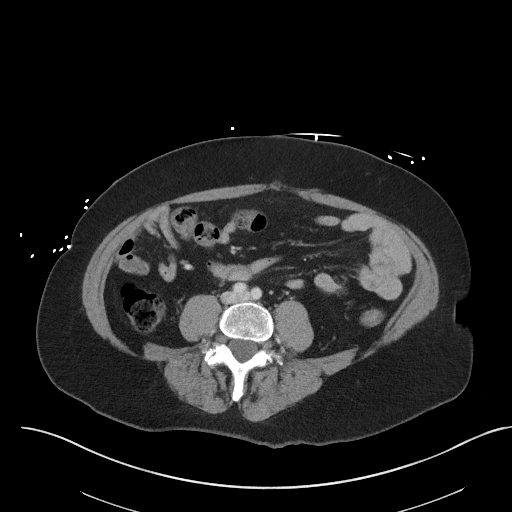
[im 60/93  soft-tissue]
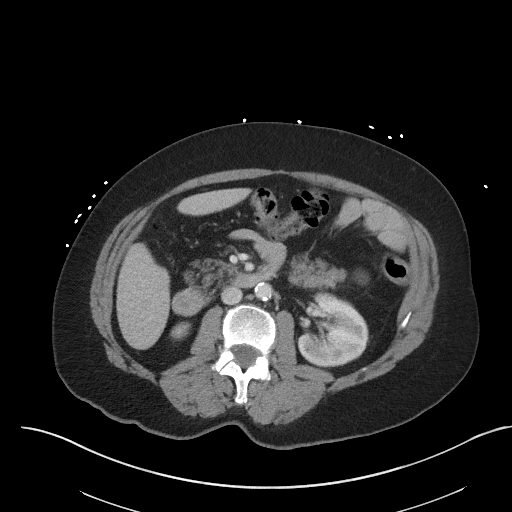
[im 65/93  soft-tissue]
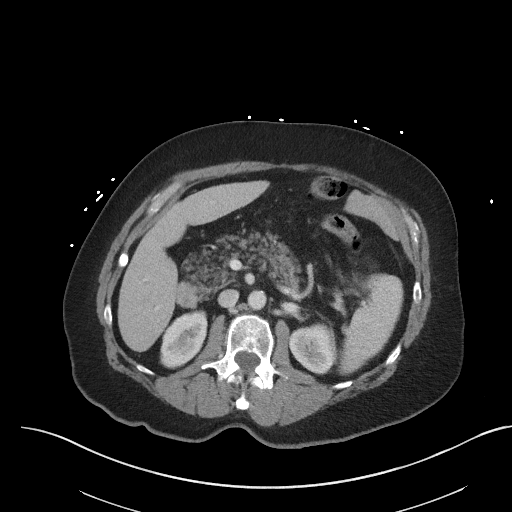
[im 65/93  bone]
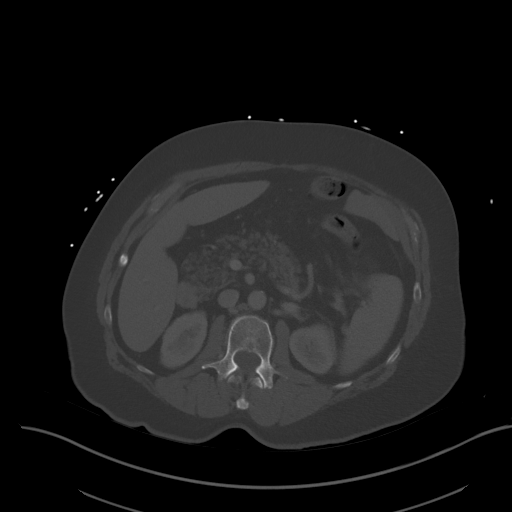
[im 71/93  soft-tissue]
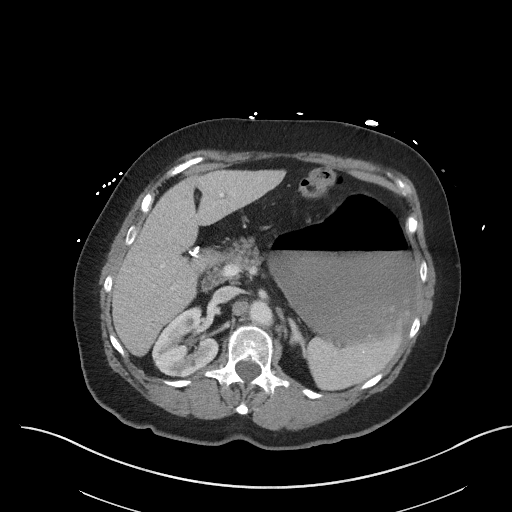
[im 82/93  soft-tissue]
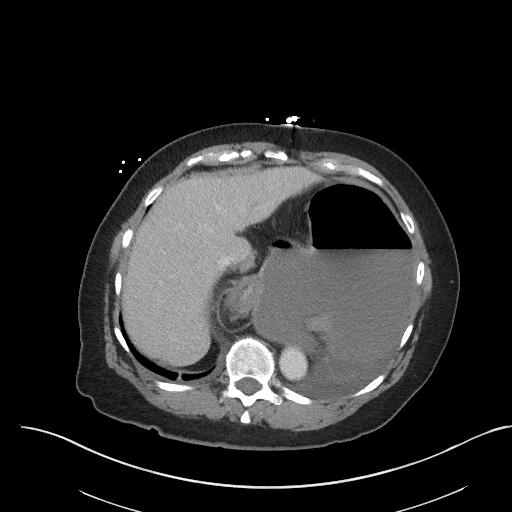
[im 87/93  soft-tissue]
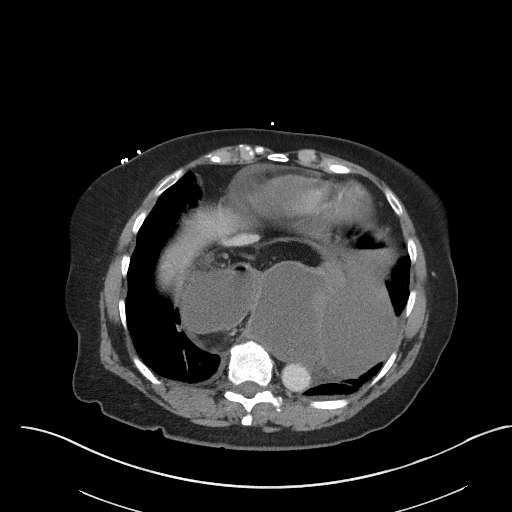

[Series 5: coronal st · coronal · 0.68mm/px · 3 of 91 slices shown]
[im 31/91  soft-tissue]
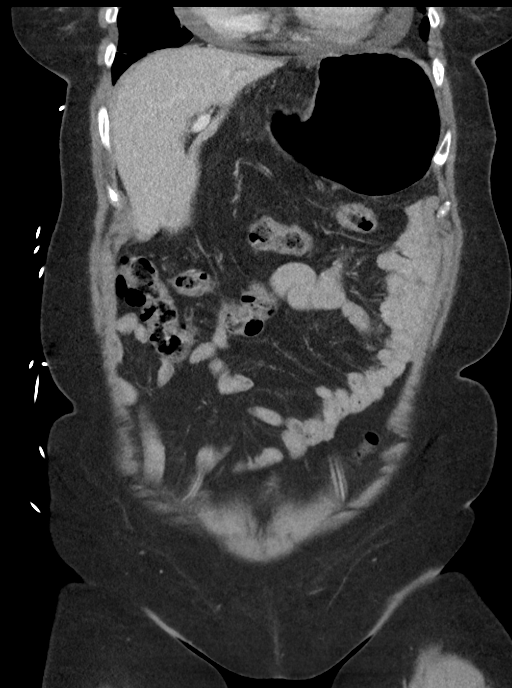
[im 41/91  soft-tissue]
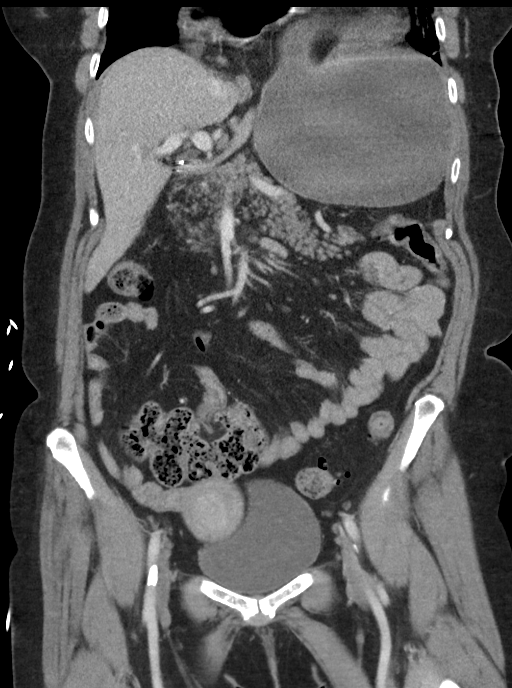
[im 51/91  soft-tissue]
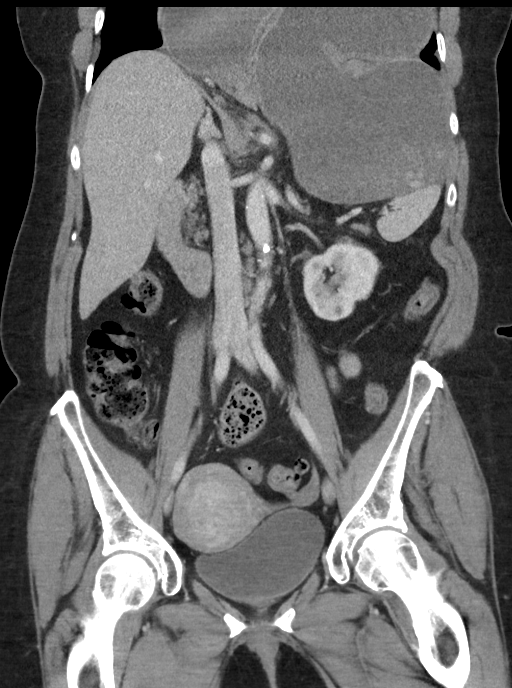

[15 of 46 positions shown; findings below may reference images not displayed]

FINDINGS: Lower chest: Abnormal fluid-filled distention of the stomach
suspicious for gastric outlet obstruction due to herniation of the
most of the stomach into the thorax. The stomach is incompletely
visualized and concern is for a gastric volvulus possibly
organo-axial with narrowing of the duodenum as it re-enters the
abdomen. CT of the chest is recommended to visualized the entirety
of the stomach within the chest. The herniated stomach impresses
upon the heart. There is a pericardial effusion measuring up to 18
mm along the left heart border and 11 mm along the right heart
border. Scarring or atelectasis is seen at the lung bases.

Hepatobiliary: Stable simple left hepatic lobe cyst measuring 3 x 2
x 1.5 cm. Cholecystectomy clips are seen. No biliary dilatation.

Pancreas: Fatty atrophy of the pancreas without inflammation or
ductal dilatation.

Spleen: Tiny too small to characterize hypodensities that may
represent small cysts or hemangiomata are noted within the normal
sized spleen. A few scattered calcified granulomata are also
present.

Adrenals/Urinary Tract: Normal bilateral adrenal glands. Renal
hypodensities compatible with cysts are noted, most are
subcentimeter and are too small to further characterize, the largest
is in the lower pole the left kidney measuring 14 mm. No
nephrolithiasis. No hydroureteronephrosis. The urinary bladder is
unremarkable.

Stomach/Bowel: Apart from the markedly fluid-filled distended
stomach which has herniated into the included thorax, the small and
large intestine are unremarkable apart from scattered colonic
diverticulosis without acute diverticulitis. Air-filled normal
appendix is visualized. Moderate stool in the rectum.

Vascular/Lymphatic: No aortic aneurysm. There is mild aortic
atherosclerosis. No lymphadenopathy.

Reproductive: Intramural uterine fibroid measuring up to 4 cm in
diameter. No adnexal mass.

Other: No free air nor free fluid.

Musculoskeletal: No acute or significant osseous findings.
IMPRESSION: 1. Dilated fluid-filled stomach, most of which has herniated into
the thorax including the proximal duodenum. Given fluid-filled
distention of the stomach, suspect gastric outlet obstruction. This
may represent an organoaxial gastric volvulus herniated into the
chest causing the fluid-filled distention and gastric outlet
obstruction. Dedicated chest CT is recommended to better visualized
the entirety of the herniated stomach.
2. Stable simple left hepatic cyst measuring 3 x 2 x 1.5 cm.
3. Bilateral renal cysts too small to further characterize the
largest in the left lower pole measuring 14 mm.
4. Splenic granulomata with a few scattered hypodensities
statistically consistent cysts or possibly small hematopoietic
rests.
5. Fibroid uterus.

## 2017-09-06 IMAGING — CT CT CHEST W/O CM
2 of 3 series · 15 of 36 positions shown, 18 images · non-contrast
Comparison: CXR [DATE]

CLINICAL DATA: Abdominal pain since [MM] hours today. Patient
vomiting bright red blood x1.

EXAM:
CT CHEST WITHOUT CONTRAST
TECHNIQUE: Multidetector CT imaging of the chest was performed following the
standard protocol without IV contrast.

[Series 3: thorax · axial · 0.67mm/px · z∈[-477,-261]mm · 12 of 128 slices shown, 15 images]
[im 10/128  mediastinal]
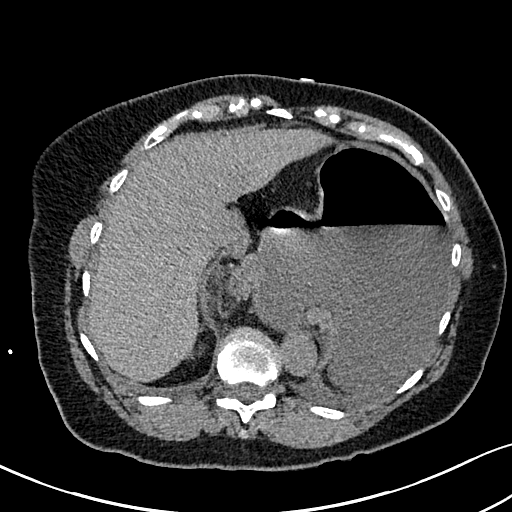
[im 10/128  lung]
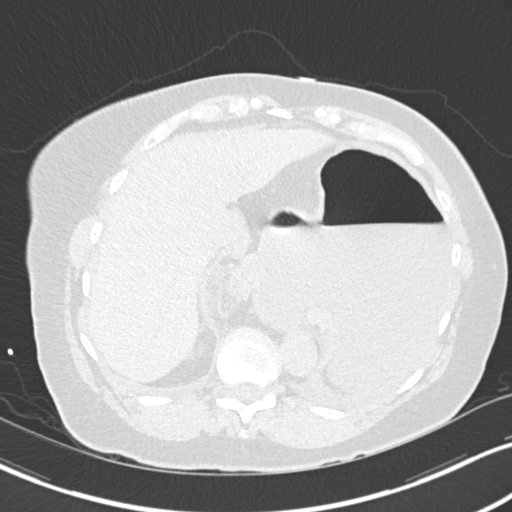
[im 19/128  lung]
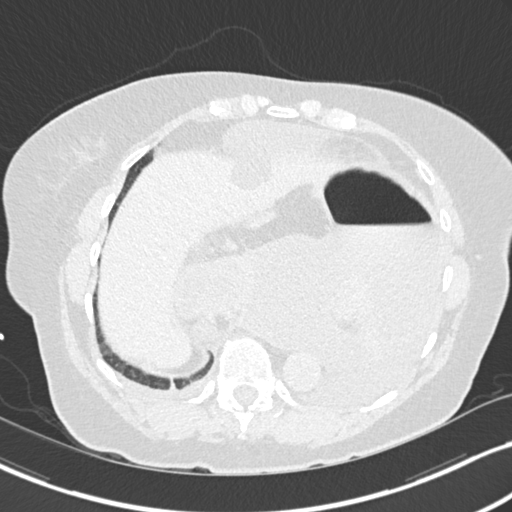
[im 29/128  lung]
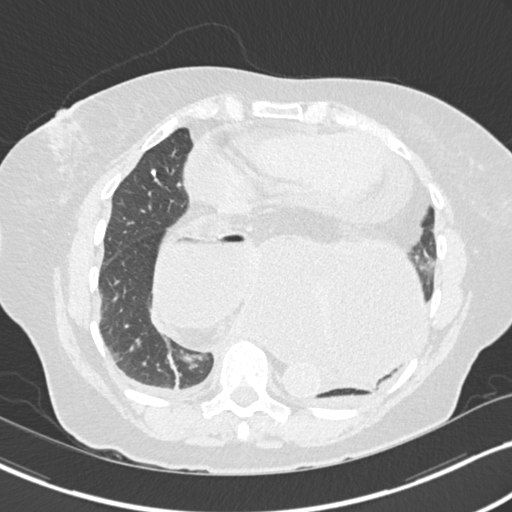
[im 38/128  lung]
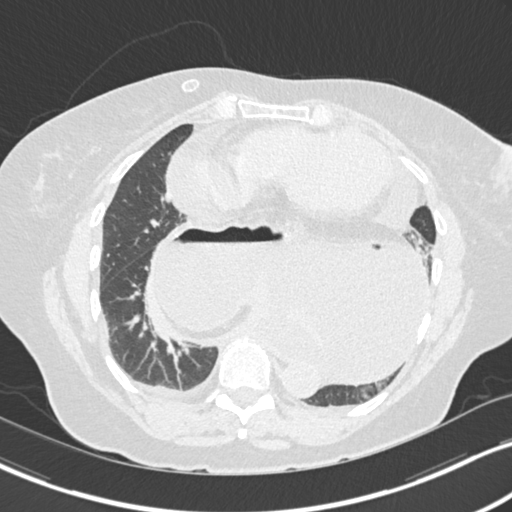
[im 48/128  mediastinal]
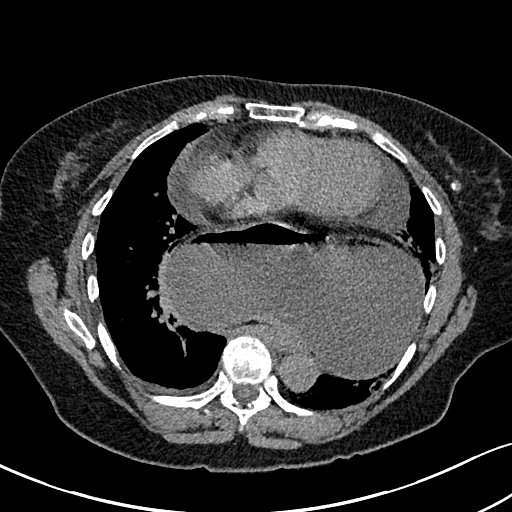
[im 48/128  lung]
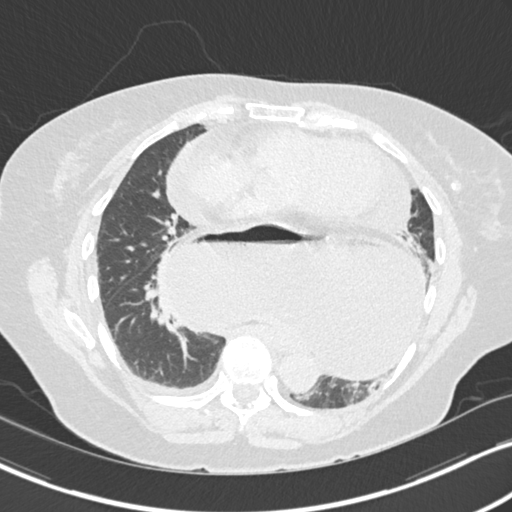
[im 57/128  lung]
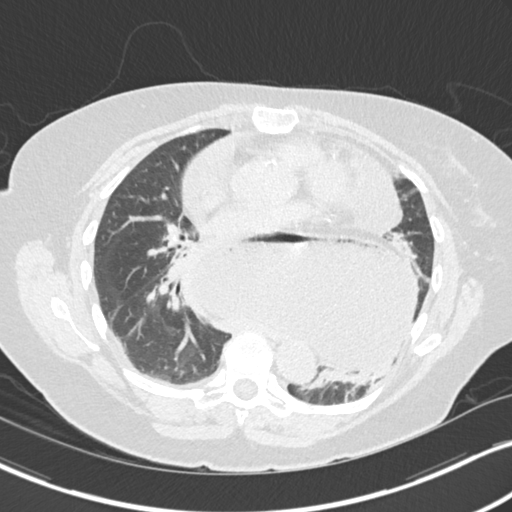
[im 71/128  lung]
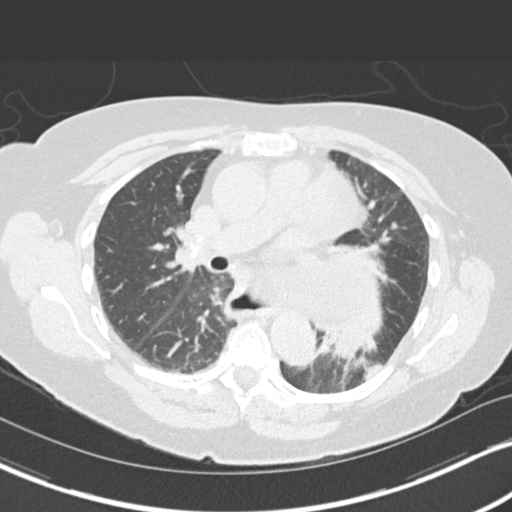
[im 80/128  lung]
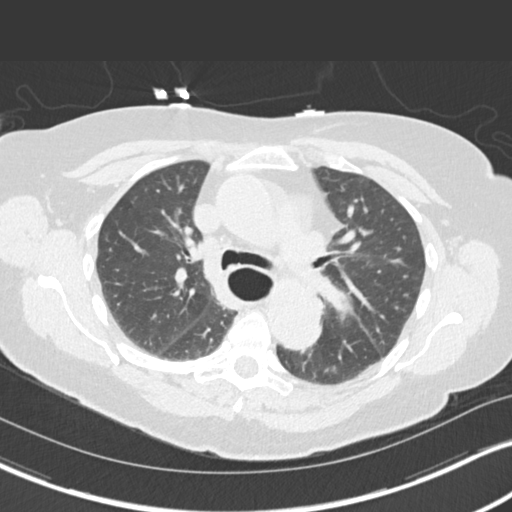
[im 90/128  mediastinal]
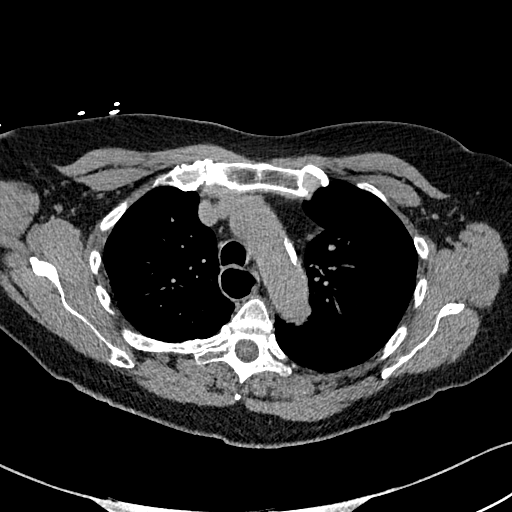
[im 90/128  lung]
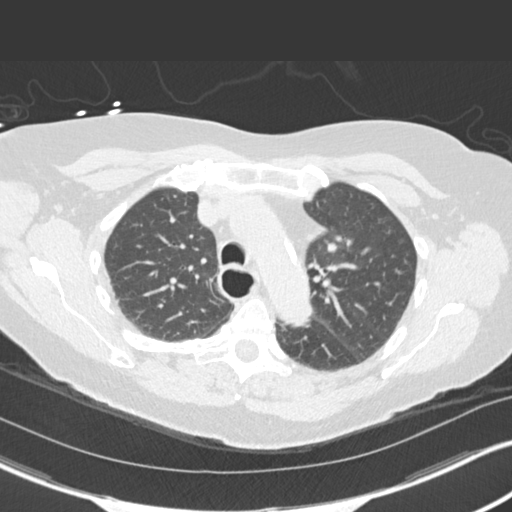
[im 99/128  lung]
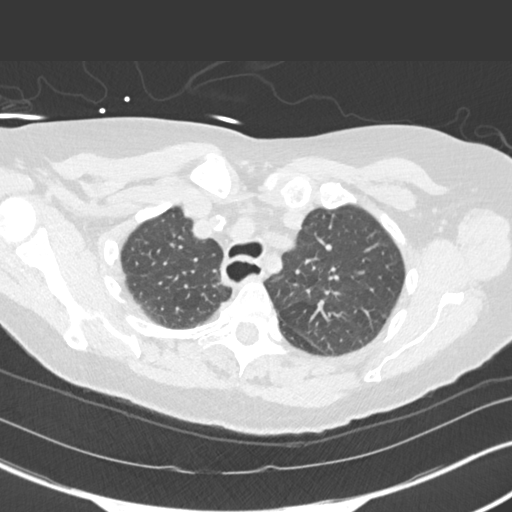
[im 109/128  lung]
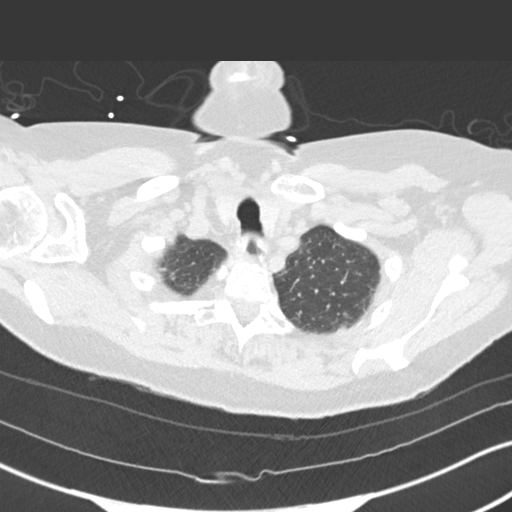
[im 118/128  lung]
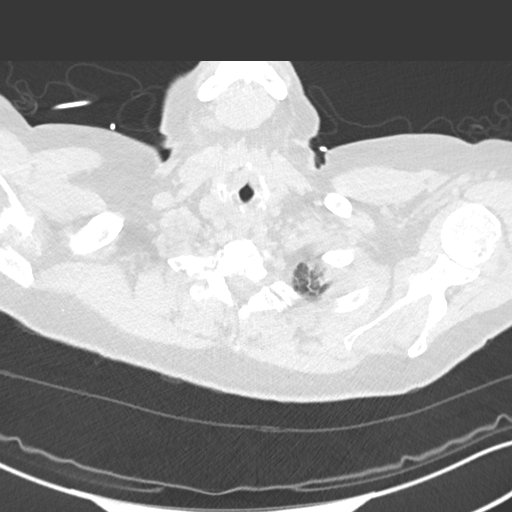

[Series 6: coronal · coronal · 0.59mm/px · 3 of 120 slices shown]
[im 24/120  lung]
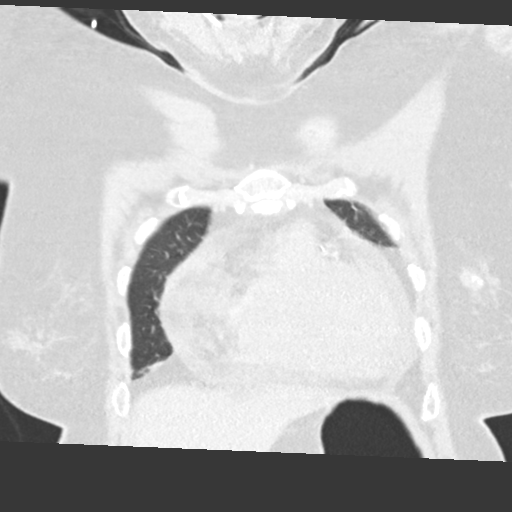
[im 48/120  lung]
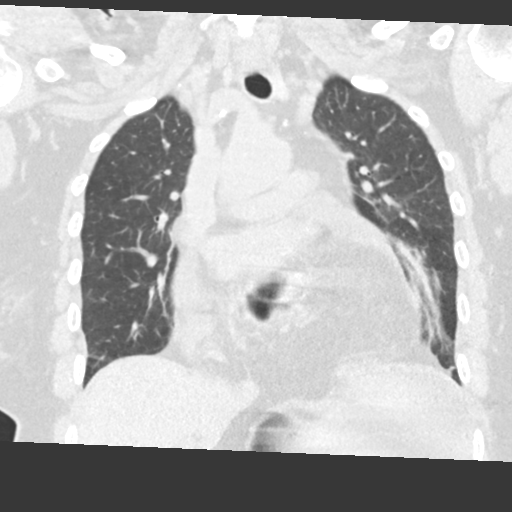
[im 72/120  lung]
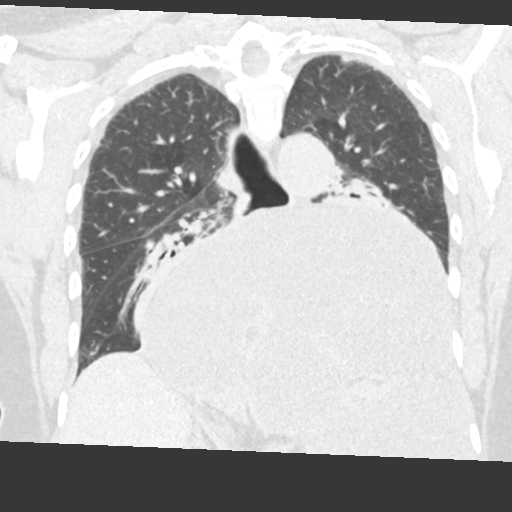

[15 of 36 positions shown; findings below may reference images not displayed]

FINDINGS: Cardiovascular: Extrinsic impression along the posterior aspect of
the heart from a large fluid-filled paraesophageal hernia.
Moderate-sized pericardial effusion measuring up to 2.2 cm on the
left and 1.8 cm on the right. Ectasia of the ascending thoracic
aorta to 3.5 cm. Mild atherosclerosis at the arch. No dissection.
Coronary arteriosclerosis is identified along the left main left
circumflex. Pulmonary vasculature is unremarkable. Calcified right
hilar lymph nodes compatible with granulomatous disease.

Mediastinum/Nodes: No thyromegaly or mass. Patent trachea and
mainstem bronchi. No lymphadenopathy. There is dilatation of the
proximal to mid esophagus before extrinsic impression from a large
paraesophageal hernia narrows the lumen. The fundus, most of the
gastric body and pylorus appear to have herniated into the thorax
with the gastroesophageal juncture near its anatomic location.
Findings are therefore felt to represent a large paraesophageal
hernia. Given the degree of fluid-filled distention of the herniated
portion of stomach, findings would be in keeping with gastric outlet
obstruction. No pneumatosis is noted of the stomach. Narrowing of
the proximal duodenum as it reenters the abdomen is seen.

Lungs/Pleura: Trace bilateral pleural effusions. There is
atelectasis at the lung bases. Pneumothorax. 5 mm nodule in left
lower lobe, series 4, image 55.

Upper Abdomen: Fluid-filled distention of stomach that has not
herniated into the chest.

Musculoskeletal: No chest wall mass or suspicious bone lesions
identified.
IMPRESSION: 1. Normal position of the GE junction with herniated gastric fundus,
most of the body of the stomach, pylorus and proximal duodenum into
the thorax with fluid-filled distention of the herniated and non
herniated portions of the stomach. Findings would be in keeping with
a large paraesophageal hernia with gastric outlet obstruction. No
pneumatosis of the stomach.
2. 5 mm left lower lobe pulmonary nodule possibly a small granuloma
given presence of calcified mediastinal lymph nodes. No follow-up
needed if patient is low-risk. Non-contrast chest CT can be
considered in 12 months if patient is high-risk. This recommendation
follows the consensus statement: Guidelines for Management of
Incidental Pulmonary Nodules Detected on CT Images: From the
3. Pericardial effusion is seen on the CT of the abdomen somewhat
accentuated due to extrinsic impression from the fluid-filled
herniated stomach on the heart and pericardium. This measures up
cm on the left and 1.8 cm on the right on the chest CT.

These results were called by telephone at the time of interpretation
on [DATE] at [DATE] to Dr. MOATSHE , who verbally
acknowledged these results.

Aortic Atherosclerosis ([MM]-[MM]).

## 2017-09-06 MED ORDER — MORPHINE SULFATE (PF) 4 MG/ML IV SOLN
4.0000 mg | Freq: Once | INTRAVENOUS | Status: AC
  Administered 2017-09-06: 4 mg via INTRAVENOUS

## 2017-09-06 MED ORDER — ONDANSETRON HCL 4 MG/2ML IJ SOLN
INTRAMUSCULAR | Status: AC
  Filled 2017-09-06: qty 2

## 2017-09-06 MED ORDER — ONDANSETRON HCL 4 MG/2ML IJ SOLN
4.0000 mg | Freq: Once | INTRAMUSCULAR | Status: AC
  Administered 2017-09-06: 4 mg via INTRAVENOUS

## 2017-09-06 MED ORDER — SODIUM CHLORIDE 0.9 % IV BOLUS
1000.0000 mL | Freq: Once | INTRAVENOUS | Status: AC
  Administered 2017-09-06: 1000 mL via INTRAVENOUS

## 2017-09-06 MED ORDER — HYDROMORPHONE HCL 1 MG/ML IJ SOLN
1.0000 mg | Freq: Once | INTRAMUSCULAR | Status: AC
  Administered 2017-09-06: 1 mg via INTRAVENOUS

## 2017-09-06 MED ORDER — HYDROMORPHONE HCL 1 MG/ML IJ SOLN
INTRAMUSCULAR | Status: AC
  Administered 2017-09-06: 1 mg via INTRAVENOUS
  Filled 2017-09-06: qty 1

## 2017-09-06 MED ORDER — MORPHINE SULFATE (PF) 4 MG/ML IV SOLN
INTRAVENOUS | Status: AC
  Filled 2017-09-06: qty 1

## 2017-09-06 MED ORDER — IOPAMIDOL (ISOVUE-300) INJECTION 61%
75.0000 mL | Freq: Once | INTRAVENOUS | Status: AC | PRN
  Administered 2017-09-06: 75 mL via INTRAVENOUS

## 2017-09-06 NOTE — ED Notes (Signed)
ED Provider at bedside. 

## 2017-09-06 NOTE — ED Notes (Signed)
Manson PasseyBrown MD notified of 2.3 Lactic Acid result

## 2017-09-06 NOTE — ED Notes (Signed)
Unsuccessful NG tube placement x2 by this RN, asking Lerry LinerMichele RN to attempt.

## 2017-09-06 NOTE — ED Notes (Signed)
[facility]

## 2017-09-06 NOTE — ED Notes (Signed)
Patient transported to CT 

## 2017-09-06 NOTE — ED Provider Notes (Signed)
[facility] Emergency Department Provider Note    First MD Initiated Contact with Patient 09/06/17 0000     (approximate)  I have reviewed the triage vital signs and the nursing notes.   HISTORY  Chief Complaint Abdominal Pain    HPI Jessica Little is a 72 y.o. female with below list of chronic medical conditions with history of hiatal hernia presents to the emergency department with acute onset of epigastric/upper abdominal discomfort with associated multiple episodes of vomiting with some blood (streaks) noted in the emesis.  Patient states that symptoms began at 2 PM today   Past Medical History:  Diagnosis Date  . Anxiety   . GERD (gastroesophageal reflux disease)   . Hyperlipidemia   . Hypertension     Patient Active Problem List   Diagnosis Date Noted  . Osteopenia 10/10/2016  . H/O urinary frequency 07/06/2015  . Anxiety 02/03/2015  . Clinical depression 02/03/2015  . Acid reflux 02/03/2015  . HLD (hyperlipidemia) 02/03/2015  . BP (high blood pressure) 02/03/2015  . Cannot sleep 02/03/2015  . Dislocated shoulder 02/03/2015  . Thyroid nodule 02/03/2015    Past Surgical History:  Procedure Laterality Date  . CHOLECYSTECTOMY    . COLONOSCOPY  01/01/2006  . COLONOSCOPY WITH PROPOFOL N/A 08/23/2015   Procedure: COLONOSCOPY WITH PROPOFOL;  Surgeon: Earline Mayotte, MD;  Location: [facility] ENDOSCOPY;  Service: Endoscopy;  Laterality: N/A;    Prior to Admission medications   Medication Sig Start Date End Date Taking? Authorizing Provider  albuterol (PROVENTIL HFA;VENTOLIN HFA) 108 (90 Base) MCG/ACT inhaler Inhale 2 puffs into the lungs every 6 (six) hours as needed for wheezing or shortness of breath. 06/27/17  Yes Margaretann Loveless, PA-C  aspirin 81 MG tablet Take 81 mg by mouth daily.    Yes [provider]  atenolol (TENORMIN) 25 MG tablet Take 1 tablet (25 mg total) by mouth 2 (two) times daily. Patient taking differently:  Take 50 mg by mouth daily.  03/25/17  Yes Margaretann Loveless, PA-C  Ca Phosphate-Cholecalciferol (CALCIUM 500 + D3) 250-500 MG-UNIT CHEW Chew 2 capsules by mouth daily.   Yes [provider]  LORazepam (ATIVAN) 1 MG tablet Take 0.5 mg by mouth every 6 (six) hours as needed.    Yes [provider]  Melatonin 5 MG CAPS Take by mouth at bedtime as needed.    Yes [provider]  nortriptyline (PAMELOR) 50 MG capsule Take 50 mg by mouth 2 (two) times daily.  10/01/13  Yes [provider]  oxybutynin (DITROPAN) 5 MG tablet Take 1 tablet (5 mg total) by mouth every evening. 03/25/17  Yes Margaretann Loveless, PA-C  perphenazine (TRILAFON) 8 MG tablet Take 8 mg by mouth at bedtime.  10/01/13  Yes [provider]  simvastatin (ZOCOR) 20 MG tablet Take 1 tablet (20 mg total) by mouth daily. 07/26/17  Yes Margaretann Loveless, PA-C  vitamin E 1000 UNIT capsule Take 1,000 Units by mouth daily.    Yes [provider]  doxycycline (VIBRA-TABS) 100 MG tablet Take 1 tablet (100 mg total) by mouth 2 (two) times daily. 06/27/17   Margaretann Loveless, PA-C  omeprazole (PRILOSEC) 40 MG capsule Take 40 mg by mouth daily. Reported on 08/23/2015    [provider]    Allergies No known drug allergies  Family History  Problem Relation Age of Onset  . Arrhythmia Mother   . Heart attack Father   . Stroke Father   .  Healthy Sister   . Healthy Brother   . Healthy Sister   . Breast cancer Neg Hx     Social History Social History   Tobacco Use  . Smoking status: Never Smoker  . Smokeless tobacco: Never Used  Substance Use Topics  . Alcohol use: No  . Drug use: No    Review of Systems Constitutional: No fever/chills Eyes: No visual changes. ENT: No sore throat. Cardiovascular: Denies chest pain. Respiratory: Denies shortness of breath. Gastrointestinal: Positive for abdominal pain nausea vomiting. Genitourinary: Negative for  dysuria. Musculoskeletal: Negative for neck pain.  Negative for back pain. Integumentary: Negative for rash. Neurological: Negative for headaches, focal weakness or numbness.   ____________________________________________   PHYSICAL EXAM:  VITAL SIGNS: ED Triage Vitals  Enc Vitals Group     BP 09/05/17 2240 (!) 198/114     Pulse Rate 09/05/17 2233 91     Resp 09/05/17 2233 (!) 26     Temp 09/05/17 2233 98.3 F (36.8 C)     Temp Source 09/05/17 2233 Oral     SpO2 09/05/17 2226 95 %     Weight 09/05/17 2235 77.1 kg (170 lb)     Height 09/05/17 2235 1.676 m (5\' 6" )     Head Circumference --      Peak Flow --      Pain Score 09/05/17 2234 10     Pain Loc --      Pain Edu? --      Excl. in GC? --     Constitutional: Alert and oriented.  Apparent discomfort eyes: Conjunctivae are normal.  Head: Atraumatic. Mouth/Throat: Mucous membranes are moist.  Oropharynx non-erythematous. Neck: No stridor.   Cardiovascular: Normal rate, regular rhythm. Good peripheral circulation. Grossly normal heart sounds. Respiratory: Normal respiratory effort.  No retractions. Lungs CTAB. Gastrointestinal: Epigastric abdominal discomfort.  No distention.  Musculoskeletal: No lower extremity tenderness nor edema. No gross deformities of extremities. Neurologic:  Normal speech and language. No gross focal neurologic deficits are appreciated.  Skin:  Skin is warm, dry and intact. No rash noted. Psychiatric: Mood and affect are normal. Speech and behavior are normal.  ____________________________________________   LABS (all labs ordered are listed, but only abnormal results are displayed)  Labs Reviewed  COMPREHENSIVE METABOLIC PANEL - Abnormal; Notable for the following components:      Result Value   Potassium 3.3 (*)    Chloride 97 (*)    CO2 33 (*)    Glucose, Bld 187 (*)    Creatinine, Ser 1.40 (*)    Total Protein 8.7 (*)    GFR calc non Af Amer 37 (*)    GFR calc Af Amer 43 (*)    All  other components within normal limits  CBC - Abnormal; Notable for the following components:   WBC 11.1 (*)    RBC 5.29 (*)    RDW 15.9 (*)    All other components within normal limits  URINALYSIS, COMPLETE (UACMP) WITH MICROSCOPIC - Abnormal; Notable for the following components:   Color, Urine YELLOW (*)    APPearance CLOUDY (*)    Specific Gravity, Urine 1.032 (*)    Glucose, UA 150 (*)    Ketones, ur 5 (*)    Protein, ur 30 (*)    All other components within normal limits  LIPASE, BLOOD  TROPONIN I  LACTIC ACID, PLASMA  LACTIC ACID, PLASMA     RADIOLOGY I, [place] Rien Marland, personally viewed and evaluated these images (  plain radiographs) as part of my medical decision making, as well as reviewing the written report by the radiologist.  ED MD interpretation:  Dilated fluid-filled stomach, most of which has herniated into the thorax including the proximal duodenum. Given fluid-filled distention of the stomach, suspect gastric outlet obstruction. This may represent an organoaxial gastric volvulus herniated into the chest causing the fluid-filled distention and gastric outlet obstruction. Dedicated chest CT is recommended to better visualized the entirety of the herniated stomach per radiologist.  Official radiology report(s): Ct Chest Wo Contrast  Result Date: 09/06/2017 CLINICAL DATA:  Abdominal pain since 1400 hours today. Patient vomiting bright red blood x1. EXAM: CT CHEST WITHOUT CONTRAST TECHNIQUE: Multidetector CT imaging of the chest was performed following the standard protocol without IV contrast. COMPARISON:  CXR 06/27/2017 FINDINGS: Cardiovascular: Extrinsic impression along the posterior aspect of the heart from a large fluid-filled paraesophageal hernia. Moderate-sized pericardial effusion measuring up to 2.2 cm on the left and 1.8 cm on the right. Ectasia of the ascending thoracic aorta to 3.5 cm. Mild atherosclerosis at the arch. No dissection. Coronary  arteriosclerosis is identified along the left main left circumflex. Pulmonary vasculature is unremarkable. Calcified right hilar lymph nodes compatible with granulomatous disease. Mediastinum/Nodes: No thyromegaly or mass. Patent trachea and mainstem bronchi. No lymphadenopathy. There is dilatation of the proximal to mid esophagus before extrinsic impression from a large paraesophageal hernia narrows the lumen. The fundus, most of the gastric body and pylorus appear to have herniated into the thorax with the gastroesophageal juncture near its anatomic location. Findings are therefore felt to represent a large paraesophageal hernia. Given the degree of fluid-filled distention of the herniated portion of stomach, findings would be in keeping with gastric outlet obstruction. No pneumatosis is noted of the stomach. Narrowing of the proximal duodenum as it reenters the abdomen is seen. Lungs/Pleura: Trace bilateral pleural effusions. There is atelectasis at the lung bases. Pneumothorax. 5 mm nodule in left lower lobe, series 4, image 55. Upper Abdomen: Fluid-filled distention of stomach that has not herniated into the chest. Musculoskeletal: No chest wall mass or suspicious bone lesions identified. IMPRESSION: 1. Normal position of the GE junction with herniated gastric fundus, most of the body of the stomach, pylorus and proximal duodenum into the thorax with fluid-filled distention of the herniated and non herniated portions of the stomach. Findings would be in keeping with a large paraesophageal hernia with gastric outlet obstruction. No pneumatosis of the stomach. 2. 5 mm left lower lobe pulmonary nodule possibly a small granuloma given presence of calcified mediastinal lymph nodes. No follow-up needed if patient is low-risk. Non-contrast chest CT can be considered in 12 months if patient is high-risk. This recommendation follows the consensus statement: Guidelines for Management of Incidental Pulmonary Nodules  Detected on CT Images: From the Fleischner Society 2017; Radiology 2017; 284:228-243. 3. Pericardial effusion is seen on the CT of the abdomen somewhat accentuated due to extrinsic impression from the fluid-filled herniated stomach on the heart and pericardium. This measures up 2.2 cm on the left and 1.8 cm on the right on the chest CT. These results were called by telephone at the time of interpretation on 09/06/2017 at 2:43 am to Dr. Bayard Males , who verbally acknowledged these results. Aortic Atherosclerosis (ICD10-I70.0). Electronically Signed   By: Tollie Eth M.D.   On: 09/06/2017 02:43   Ct Abdomen Pelvis W Contrast  Result Date: 09/06/2017 CLINICAL DATA:  Abdominal pain since 1400 hours with hematemesis x1. EXAM: CT ABDOMEN AND  PELVIS WITH CONTRAST TECHNIQUE: Multidetector CT imaging of the abdomen and pelvis was performed using the standard protocol following bolus administration of intravenous contrast. CONTRAST:  75mL ISOVUE-300 IOPAMIDOL (ISOVUE-300) INJECTION 61% COMPARISON:  04/30/2014 CT, CXR 07/22/2015 FINDINGS: Lower chest: Abnormal fluid-filled distention of the stomach suspicious for gastric outlet obstruction due to herniation of the most of the stomach into the thorax. The stomach is incompletely visualized and concern is for a gastric volvulus possibly organo-axial with narrowing of the duodenum as it re-enters the abdomen. CT of the chest is recommended to visualized the entirety of the stomach within the chest. The herniated stomach impresses upon the heart. There is a pericardial effusion measuring up to 18 mm along the left heart border and 11 mm along the right heart border. Scarring or atelectasis is seen at the lung bases. Hepatobiliary: Stable simple left hepatic lobe cyst measuring 3 x 2 x 1.5 cm. Cholecystectomy clips are seen. No biliary dilatation. Pancreas: Fatty atrophy of the pancreas without inflammation or ductal dilatation. Spleen: Tiny too small to characterize  hypodensities that may represent small cysts or hemangiomata are noted within the normal sized spleen. A few scattered calcified granulomata are also present. Adrenals/Urinary Tract: Normal bilateral adrenal glands. Renal hypodensities compatible with cysts are noted, most are subcentimeter and are too small to further characterize, the largest is in the lower pole the left kidney measuring 14 mm. No nephrolithiasis. No hydroureteronephrosis. The urinary bladder is unremarkable. Stomach/Bowel: Apart from the markedly fluid-filled distended stomach which has herniated into the included thorax, the small and large intestine are unremarkable apart from scattered colonic diverticulosis without acute diverticulitis. Air-filled normal appendix is visualized. Moderate stool in the rectum. Vascular/Lymphatic: No aortic aneurysm. There is mild aortic atherosclerosis. No lymphadenopathy. Reproductive: Intramural uterine fibroid measuring up to 4 cm in diameter. No adnexal mass. Other: No free air nor free fluid. Musculoskeletal: No acute or significant osseous findings. IMPRESSION: 1. Dilated fluid-filled stomach, most of which has herniated into the thorax including the proximal duodenum. Given fluid-filled distention of the stomach, suspect gastric outlet obstruction. This may represent an organoaxial gastric volvulus herniated into the chest causing the fluid-filled distention and gastric outlet obstruction. Dedicated chest CT is recommended to better visualized the entirety of the herniated stomach. 2. Stable simple left hepatic cyst measuring 3 x 2 x 1.5 cm. 3. Bilateral renal cysts too small to further characterize the largest in the left lower pole measuring 14 mm. 4. Splenic granulomata with a few scattered hypodensities statistically consistent cysts or possibly small hematopoietic rests. 5. Fibroid uterus. Electronically Signed   By: Tollie Eth M.D.   On: 09/06/2017 01:08       Procedures   ____________________________________________   INITIAL IMPRESSION / ASSESSMENT AND PLAN / ED COURSE  As part of my medical decision making, I reviewed the following data within the electronic MEDICAL RECORD NUMBER  72 year old female presented with above-stated history and physical exam secondary to epigastric pain with vomiting concern for possible obstruction versus other potential intra-abdominal pathology.  CT scan of the abdomen was performed which revealed a large paraesophageal hernia with with the stomach and Duodenum with outlet obstruction.  Patient with continued vomiting and while in the emergency department as such multiple doses of Zofran was given after seen a CT scan NG tube was placed.  Patient discussed with Dr. Jimmye Norman cardiothoracic surgery who accepted the patient in transfer     ____________________________________________  FINAL CLINICAL IMPRESSION(S) / ED DIAGNOSES  Final diagnoses:  Gastric outlet obstruction  Hiatal hernia with obstruction but no gangrene     MEDICATIONS GIVEN DURING THIS VISIT:  Medications  sodium chloride 0.9 % bolus 1,000 mL (1,000 mLs Intravenous New Bag/Given 09/06/17 0444)  sodium chloride 0.9 % bolus 1,000 mL (1,000 mLs Intravenous New Bag/Given 09/06/17 0444)  ondansetron (ZOFRAN) injection 4 mg (4 mg Intravenous Given 09/05/17 2343)  morphine 4 MG/ML injection 4 mg (4 mg Intravenous Given 09/06/17 0002)  ondansetron (ZOFRAN) injection 4 mg (4 mg Intravenous Given 09/06/17 0002)  iopamidol (ISOVUE-300) 61 % injection 75 mL (75 mLs Intravenous Contrast Given 09/06/17 0022)  ondansetron (ZOFRAN) injection 4 mg (4 mg Intravenous Given 09/06/17 0218)  morphine 4 MG/ML injection 4 mg (4 mg Intravenous Given 09/06/17 0225)  HYDROmorphone (DILAUDID) injection 1 mg (1 mg Intravenous Given 09/06/17 0325)     ED Discharge Orders    None       Note:  This document was prepared using Dragon voice recognition software and  may include unintentional dictation errors.    Darci CurrentBrown, [place] N, MD 09/06/17 [phone]

## 2017-09-16 ENCOUNTER — Telehealth: Payer: Self-pay

## 2017-09-16 NOTE — Telephone Encounter (Signed)
Called pt to schedule AWV and CPE for 09/2017. NANM, will try again later.  -MM

## 2017-10-02 NOTE — Telephone Encounter (Signed)
Tried to contact pt on several different occasions and was unable to leave VMs. Pt has not returned the missed calls. Closing encounter.  -MM

## 2017-10-08 DIAGNOSIS — Z9889 Other specified postprocedural states: Secondary | ICD-10-CM | POA: Diagnosis not present

## 2017-10-08 DIAGNOSIS — F339 Major depressive disorder, recurrent, unspecified: Secondary | ICD-10-CM | POA: Diagnosis not present

## 2017-10-08 DIAGNOSIS — F251 Schizoaffective disorder, depressive type: Secondary | ICD-10-CM | POA: Diagnosis not present

## 2017-10-08 DIAGNOSIS — R262 Difficulty in walking, not elsewhere classified: Secondary | ICD-10-CM | POA: Diagnosis not present

## 2017-10-08 DIAGNOSIS — Z09 Encounter for follow-up examination after completed treatment for conditions other than malignant neoplasm: Secondary | ICD-10-CM | POA: Diagnosis not present

## 2017-10-08 DIAGNOSIS — F331 Major depressive disorder, recurrent, moderate: Secondary | ICD-10-CM | POA: Diagnosis not present

## 2017-10-08 DIAGNOSIS — Z4889 Encounter for other specified surgical aftercare: Secondary | ICD-10-CM | POA: Diagnosis not present

## 2017-10-08 DIAGNOSIS — E559 Vitamin D deficiency, unspecified: Secondary | ICD-10-CM | POA: Diagnosis not present

## 2017-10-08 DIAGNOSIS — F419 Anxiety disorder, unspecified: Secondary | ICD-10-CM | POA: Diagnosis not present

## 2017-10-08 DIAGNOSIS — K449 Diaphragmatic hernia without obstruction or gangrene: Secondary | ICD-10-CM | POA: Diagnosis not present

## 2017-10-08 DIAGNOSIS — R531 Weakness: Secondary | ICD-10-CM | POA: Diagnosis not present

## 2017-10-08 DIAGNOSIS — I10 Essential (primary) hypertension: Secondary | ICD-10-CM | POA: Diagnosis not present

## 2017-10-08 DIAGNOSIS — G4700 Insomnia, unspecified: Secondary | ICD-10-CM | POA: Diagnosis not present

## 2017-10-08 DIAGNOSIS — R918 Other nonspecific abnormal finding of lung field: Secondary | ICD-10-CM | POA: Diagnosis not present

## 2017-10-08 DIAGNOSIS — M6281 Muscle weakness (generalized): Secondary | ICD-10-CM | POA: Diagnosis not present

## 2017-10-08 DIAGNOSIS — K440 Diaphragmatic hernia with obstruction, without gangrene: Secondary | ICD-10-CM | POA: Diagnosis not present

## 2017-10-09 DIAGNOSIS — I10 Essential (primary) hypertension: Secondary | ICD-10-CM | POA: Diagnosis not present

## 2017-10-09 DIAGNOSIS — R531 Weakness: Secondary | ICD-10-CM | POA: Diagnosis not present

## 2017-10-09 DIAGNOSIS — F331 Major depressive disorder, recurrent, moderate: Secondary | ICD-10-CM | POA: Diagnosis not present

## 2017-10-09 DIAGNOSIS — K440 Diaphragmatic hernia with obstruction, without gangrene: Secondary | ICD-10-CM | POA: Diagnosis not present

## 2017-10-23 DIAGNOSIS — I10 Essential (primary) hypertension: Secondary | ICD-10-CM | POA: Diagnosis not present

## 2017-10-23 DIAGNOSIS — K440 Diaphragmatic hernia with obstruction, without gangrene: Secondary | ICD-10-CM | POA: Diagnosis not present

## 2017-10-23 DIAGNOSIS — F251 Schizoaffective disorder, depressive type: Secondary | ICD-10-CM | POA: Diagnosis not present

## 2017-10-23 DIAGNOSIS — F331 Major depressive disorder, recurrent, moderate: Secondary | ICD-10-CM | POA: Diagnosis not present

## 2017-10-24 DIAGNOSIS — K449 Diaphragmatic hernia without obstruction or gangrene: Secondary | ICD-10-CM | POA: Diagnosis not present

## 2017-10-24 DIAGNOSIS — I10 Essential (primary) hypertension: Secondary | ICD-10-CM | POA: Diagnosis not present

## 2017-10-24 DIAGNOSIS — Z09 Encounter for follow-up examination after completed treatment for conditions other than malignant neoplasm: Secondary | ICD-10-CM | POA: Diagnosis not present

## 2017-10-24 DIAGNOSIS — R918 Other nonspecific abnormal finding of lung field: Secondary | ICD-10-CM | POA: Diagnosis not present

## 2017-10-24 DIAGNOSIS — Z9889 Other specified postprocedural states: Secondary | ICD-10-CM | POA: Diagnosis not present

## 2017-10-29 ENCOUNTER — Other Ambulatory Visit: Payer: Self-pay

## 2017-10-29 NOTE — Patient Outreach (Signed)
[facility]) Care Management  10/29/2017  Jessica BurrowRhoda M Little 15-Aug-1945 [id number]       Transition of Care Referral  Referral Date: 10/29/17 Referral Source:[facility] Discharge Report Date of Admission: unknown Diagnosis: "muscle weakness, generalized" Date of Discharge: 10/25/17 Facility: [facility] Insurance: [facility]   Outreach attempt # 1 to patient. Spoke with patient. She reports that she is doing fairly well. Patient confirmed that she was discharged from nursing home following rehab stay a few days ago. She voices that things are going fairly well. RN CM attempted to complete TOC but patient not very engaging and proving one word responses. She denies any needs or concerns at this time. She reports she has not made PCP appt as she was not told to do so. RN CM shared with patient general recommendations to follow up with PCP after a recent discharge from hospital or facility. Patient voiced understanding and reported she would call PCP. She denies any issues with transportation. She states she has support available if needed. Patient confirmed that she has all her meds and denies any questions or concerns regarding them. Patient denies any [facility] needs or concerns at this time.     Plan: RN CM will close case at this time.    Antionette Fairyoshanda Gram Siedlecki, RN,BSN,CCM [facility] Care [facility] Telephonic Care Management Coordinator Direct Phone: [phone] Toll Free: [phone] Fax: [phone]

## 2017-11-07 ENCOUNTER — Ambulatory Visit (INDEPENDENT_AMBULATORY_CARE_PROVIDER_SITE_OTHER): Payer: PPO | Admitting: Physician Assistant

## 2017-11-07 ENCOUNTER — Encounter: Payer: Self-pay | Admitting: Physician Assistant

## 2017-11-07 VITALS — BP 84/60 | HR 78 | Temp 97.5°F | Resp 16 | Ht 66.0 in | Wt 156.8 lb

## 2017-11-07 DIAGNOSIS — I952 Hypotension due to drugs: Secondary | ICD-10-CM

## 2017-11-07 DIAGNOSIS — I10 Essential (primary) hypertension: Secondary | ICD-10-CM | POA: Diagnosis not present

## 2017-11-07 DIAGNOSIS — K449 Diaphragmatic hernia without obstruction or gangrene: Secondary | ICD-10-CM

## 2017-11-07 DIAGNOSIS — K3184 Gastroparesis: Secondary | ICD-10-CM

## 2017-11-07 DIAGNOSIS — F251 Schizoaffective disorder, depressive type: Secondary | ICD-10-CM

## 2017-11-07 DIAGNOSIS — D500 Iron deficiency anemia secondary to blood loss (chronic): Secondary | ICD-10-CM

## 2017-11-07 MED ORDER — ATENOLOL 50 MG PO TABS: 50.0000 mg | ORAL_TABLET | Freq: Every day | ORAL | 3 refills | Status: DC

## 2017-11-07 MED ORDER — PERPHENAZINE 4 MG PO TABS: ORAL_TABLET | ORAL | 1 refills | Status: DC

## 2017-11-07 MED ORDER — LISINOPRIL 5 MG PO TABS: 5.0000 mg | ORAL_TABLET | Freq: Every day | ORAL | 3 refills | Status: DC

## 2017-11-07 NOTE — Progress Notes (Signed)
Patient: Jessica Little Female    DOB: [birth year]   72 y.o.   MRN: [id number] Visit Date: 11/07/2017  Today's Provider: Margaretann Loveless, PA-C   Chief Complaint  Patient presents with  . Hypertension   Subjective:    HPI  Follow up Hospitalization  Patient was admitted to [facility] on 09/05/17, patient was then taken to [facility] on 09/06/17 and discharged on 10/09/2017 . She was treated for paraesophageal hernia. Treatment for this included surgical repair. Following surgery she developed severe gastroparesis. She had a PEG tube placed and a gastroduodenal stent placed. Since she has been able to increase her oral intake slowly and has had PEG tube removed. Stent is still in place and may remain upwards of a year, pending improvements with oral intake before consideration of removal. Also during hospitalization she became anemic and required blood transfusions. Last HgB was 9.3 on 10/01/17 at [facility]. She was also noted to have hypertension. Her atenolol was increased from 25mg  BID to 100mg  once daily and lisinopril 5 mg was added. She is compliant with medications but since discharge has had worsening dizziness, lightheadedness with standing and fatigue. She also has had a 14 pound weight loss (170 pounds on 09/05/17-admission to now 156 pounds today secondary to poor oral intake.  Telephone follow up was done on 09/16/17 She reports good compliance with treatment. She reports this condition is Improved. ------------------------------------------------------------------------------------    No Known Allergies   Current Outpatient Medications:  .  albuterol (PROVENTIL HFA;VENTOLIN HFA) 108 (90 Base) MCG/ACT inhaler, Inhale 2 puffs into the lungs every 6 (six) hours as needed for wheezing or shortness of breath., Disp: 1 Inhaler, Rfl: 0 .  atenolol (TENORMIN) 100 MG tablet, Take 100 mg by mouth daily., Disp: , Rfl: 0 .  lisinopril (PRINIVIL,ZESTRIL) 5 MG tablet, Take 1 tablet by mouth daily.,  Disp: , Rfl:  .  Melatonin 5 MG CAPS, Take by mouth at bedtime as needed. , Disp: , Rfl:  .  nortriptyline (PAMELOR) 50 MG capsule, Take 50 mg by mouth at bedtime. , Disp: , Rfl:  .  oxybutynin (DITROPAN) 5 MG tablet, Take 1 tablet (5 mg total) by mouth every evening., Disp: 90 tablet, Rfl: 3 .  perphenazine (TRILAFON) 4 MG tablet, TAKE 1 TABLET BY MOUTH AT BEDTIME RELATED TO SCHIZOAFFECTIVE DISORDER DEPRESSIVE TYPE, Disp: , Rfl: 0 .  simvastatin (ZOCOR) 20 MG tablet, Take 1 tablet (20 mg total) by mouth daily., Disp: 90 tablet, Rfl: 3 .  aspirin 81 MG tablet, Take 81 mg by mouth daily. , Disp: , Rfl:   Review of Systems  Constitutional: Positive for fatigue.  HENT: Negative.   Respiratory: Negative.   Cardiovascular: Negative.   Gastrointestinal: Negative.   Neurological: Positive for dizziness, weakness and light-headedness.    Social History   Tobacco Use  . Smoking status: Never Smoker  . Smokeless tobacco: Never Used  Substance Use Topics  . Alcohol use: No   Objective:   BP (!) 84/60 (BP Location: Left Arm, Patient Position: Sitting, Cuff Size: Normal)   Pulse 78   Temp (!) 97.5 F (36.4 C) (Oral)   Resp 16   Ht 5\' 6"  (1.676 m)   Wt 156 lb 12.8 oz (71.1 kg)   SpO2 97%   BMI 25.31 kg/m  Vitals:   11/07/17 1113  BP: (!) 84/60  Pulse: 78  Resp: 16  Temp: (!) 97.5 F (36.4 C)  TempSrc: Oral  SpO2: 97%  Weight: 156 lb 12.8 oz (71.1 kg)  Height: 5\' 6"  (1.676 m)    Physical Exam  Constitutional: She appears well-developed and well-nourished. No distress.  Neck: Normal range of motion. Neck supple. No JVD present. No tracheal deviation present. No thyromegaly present.  Cardiovascular: Normal rate, regular rhythm and normal heart sounds. Exam reveals no gallop and no friction rub.  No murmur heard. Pulmonary/Chest: Effort normal and breath sounds normal. No respiratory distress. She has no wheezes. She has no rales.  Patient appears to be taking labored breaths but  denies any SOB or difficulty breathing  Lymphadenopathy:    She has no cervical adenopathy.  Skin: She is not diaphoretic.  Vitals reviewed.      Assessment & Plan:     1. Essential hypertension BP low today. Decrease atenolol back to 50mg  daily. Continue lisinopril. Will recheck labs to see if HgB maintaining. I will see her back next week to check BP before [place]17.  - atenolol (TENORMIN) 50 MG tablet; Take 1 tablet (50 mg total) by mouth daily.  Dispense: 90 tablet; Refill: 3 - lisinopril (PRINIVIL,ZESTRIL) 5 MG tablet; Take 1 tablet (5 mg total) by mouth daily.  Dispense: 90 tablet; Refill: 3 - CBC w/Diff/Platelet - Basic Metabolic Panel (BMET)  2. Hypotension due to drugs See above medical treatment plan.  3. Schizoaffective disorder, depressive type (HCC) Stable. Diagnosis pulled for medication refill. Continue current medical treatment plan. - perphenazine (TRILAFON) 4 MG tablet; TAKE 1 TABLET BY MOUTH AT BEDTIME  Dispense: 90 tablet; Refill: 1  4. Iron deficiency anemia due to chronic blood loss From surgery. Required transfusions. Last HgB was 9.3 on 10/01/17. Will check labs as below and f/u pending results. - CBC w/Diff/Platelet - Basic Metabolic Panel (BMET)  5. Nondiabetic gastroparesis Slowly improving. Continue slowly increase of diet as tolerated.  - CBC w/Diff/Platelet - Basic Metabolic Panel (BMET)  6. Paraesophageal hernia S/P repair.        Margaretann LovelessJennifer M Burnette, PA-C  [facility]

## 2017-11-07 NOTE — Patient Instructions (Signed)
Gastroparesis °Gastroparesis, also called delayed gastric emptying, is a condition in which food takes longer than normal to empty from the stomach. The condition is usually long-lasting (chronic). °What are the causes? °This condition may be caused by: °· An endocrine disorder, such as hypothyroidism or diabetes. Diabetes is the most common cause of this condition. °· A nervous system disease, such as Parkinson disease or multiple sclerosis. °· Cancer, infection, or surgery of the stomach or vagus nerve. °· A connective tissue disorder, such as scleroderma. °· Certain medicines. ° °In most cases, the cause is not known. °What increases the risk? °This condition is more likely to develop in: °· People with certain disorders, including endocrine disorders, eating disorders, amyloidosis, and scleroderma. °· People with certain diseases, including Parkinson disease or multiple sclerosis. °· People with cancer or infection of the stomach or vagus nerve. °· People who have had surgery on the stomach or vagus nerve. °· People who take certain medicines. °· Women. ° °What are the signs or symptoms? °Symptoms of this condition include: °· An early feeling of fullness when eating. °· Nausea. °· Weight loss. °· Vomiting. °· Heartburn. °· Abdominal bloating. °· Inconsistent blood glucose levels. °· Lack of appetite. °· Acid from the stomach coming up into the esophagus (gastroesophageal reflux). °· Spasms of the stomach. ° °Symptoms may come and go. °How is this diagnosed? °This condition is diagnosed with tests, such as: °· Tests that check how long it takes food to move through the stomach and intestines. These tests include: °? Upper gastrointestinal (GI) series. In this test, X-rays of the intestines are taken after you drink a liquid. The liquid makes the intestines show up better on the X-rays. °? Gastric emptying scintigraphy. In this test, scans are taken after you eat food that contains a small amount of radioactive  material. °? Wireless capsule GI monitoring system. This test involves swallowing a capsule that records information about movement through the stomach. °· Gastric manometry. This test measures electrical and muscular activity in the stomach. It is done with a thin tube that is passed down the throat and into the stomach. °· Endoscopy. This test checks for abnormalities in the lining of the stomach. It is done with a long, thin tube that is passed down the throat and into the stomach. °· An ultrasound. This test can help rule out gallbladder disease or pancreatitis as a cause of your symptoms. It uses sound waves to take pictures of the inside of your body. ° °How is this treated? °There is no cure for gastroparesis. This condition may be managed with: °· Treatment of the underlying condition causing the gastroparesis. °· Lifestyle changes, including exercise and dietary changes. Dietary changes can include: °? Changes in what and when you eat. °? Eating smaller meals more often. °? Eating low-fat foods. °? Eating low-fiber forms of high-fiber foods, such as cooked vegetables instead of raw vegetables. °? Having liquid foods in place of solid foods. Liquid foods are easier to digest. °· Medicines. These may be given to control nausea and vomiting and to stimulate stomach muscles. °· Getting food through a feeding tube. This may be done in severe cases. °· A gastric neurostimulator. This is a device that is inserted into the body with surgery. It helps improve stomach emptying and control nausea and vomiting. ° °Follow these instructions at home: °· Follow your health care provider's instructions about exercise and diet. °· Take medicines only as directed by your health care provider. °Contact a   health care provider if: °· Your symptoms do not improve with treatment. °· You have new symptoms. °Get help right away if: °· You have severe abdominal pain that does not improve with treatment. °· You have nausea that does  not go away. °· You cannot keep fluids down. °This information is not intended to replace advice given to you by your health care provider. Make sure you discuss any questions you have with your health care provider. °Document Released: 05/14/2005 Document Revised: 10/20/2015 Document Reviewed: 05/10/2014 °Elsevier Interactive Patient Education © 2018 [employer] Inc[employer] ° °

## 2017-11-08 ENCOUNTER — Telehealth: Payer: Self-pay

## 2017-11-08 LAB — CBC WITH DIFFERENTIAL/PLATELET
Basophils Absolute: 0.1 10*3/uL (ref 0.0–0.2)
Basos: 1 %
EOS (ABSOLUTE): 0.2 10*3/uL (ref 0.0–0.4)
EOS: 3 %
HEMATOCRIT: 38.7 % (ref 34.0–46.6)
Hemoglobin: 12.2 g/dL (ref 11.1–15.9)
Immature Grans (Abs): 0 10*3/uL (ref 0.0–0.1)
Immature Granulocytes: 0 %
LYMPHS ABS: 2.4 10*3/uL (ref 0.7–3.1)
Lymphs: 34 %
MCH: 25.4 pg — AB (ref 26.6–33.0)
MCHC: 31.5 g/dL (ref 31.5–35.7)
MCV: 81 fL (ref 79–97)
MONOS ABS: 0.7 10*3/uL (ref 0.1–0.9)
Monocytes: 10 %
NEUTROS ABS: 3.8 10*3/uL (ref 1.4–7.0)
Neutrophils: 52 %
Platelets: 287 10*3/uL (ref 150–450)
RBC: 4.81 x10E6/uL (ref 3.77–5.28)
RDW: 16.7 % — AB (ref 12.3–15.4)
WBC: 7.2 10*3/uL (ref 3.4–10.8)

## 2017-11-08 LAB — BASIC METABOLIC PANEL
BUN/Creatinine Ratio: 10 — ABNORMAL LOW (ref 12–28)
BUN: 12 mg/dL (ref 8–27)
CALCIUM: 9.4 mg/dL (ref 8.7–10.3)
CO2: 22 mmol/L (ref 20–29)
Chloride: 102 mmol/L (ref 96–106)
Creatinine, Ser: 1.19 mg/dL — ABNORMAL HIGH (ref 0.57–1.00)
GFR, EST AFRICAN AMERICAN: 53 mL/min/{1.73_m2} — AB (ref >59)
GFR, EST NON AFRICAN AMERICAN: 46 mL/min/{1.73_m2} — AB (ref >59)
Glucose: 72 mg/dL (ref 65–99)
POTASSIUM: 4 mmol/L (ref 3.5–5.2)
SODIUM: 140 mmol/L (ref 134–144)

## 2017-11-08 NOTE — Telephone Encounter (Signed)
Patient advised as below.  

## 2017-11-08 NOTE — Telephone Encounter (Signed)
-----   Message from Margaretann LovelessJennifer M Burnette, [place]-C sent at 11/08/2017  2:05 PM EDT ----- Labs have improved drastically since 2 months ago. HgB now normal. WBC count now normal. Renal function returned to baseline.

## 2017-11-14 ENCOUNTER — Ambulatory Visit (INDEPENDENT_AMBULATORY_CARE_PROVIDER_SITE_OTHER): Payer: PPO | Admitting: Physician Assistant

## 2017-11-14 ENCOUNTER — Encounter: Payer: Self-pay | Admitting: Physician Assistant

## 2017-11-14 VITALS — BP 120/84 | HR 64 | Temp 97.8°F | Resp 16 | Ht 66.0 in | Wt 155.0 lb

## 2017-11-14 DIAGNOSIS — I951 Orthostatic hypotension: Secondary | ICD-10-CM

## 2017-11-14 NOTE — Patient Instructions (Signed)
Call in 2 weeks if dizziness is still occurring. If symptoms worsen again come to the office.   Orthostatic Hypotension Orthostatic hypotension is a sudden drop in blood pressure that happens when you quickly change positions, such as when you get up from a seated or lying position. Blood pressure is a measurement of how strongly, or weakly, your blood is pressing against the walls of your arteries. Arteries are blood vessels that carry blood from your heart throughout your body. When blood pressure is too low, you may not get enough blood to your brain or to the rest of your organs. This can cause weakness, light-headedness, rapid heartbeat, and fainting. This can last for just a few seconds or for up to a few minutes. Orthostatic hypotension is usually not a serious problem. However, if it happens frequently or gets worse, it may be a sign of something more serious. What are the causes? This condition may be caused by:  Sudden changes in posture, such as standing up quickly after you have been sitting or lying down.  Blood loss.  Loss of body fluids (dehydration).  Heart problems.  Hormone (endocrine) problems.  Pregnancy.  Severe infection.  Lack of certain nutrients.  Severe allergic reactions (anaphylaxis).  Certain medicines, such as blood pressure medicine or medicines that make the body lose excess fluids (diuretics). Sometimes, this condition can be caused by not taking medicine as directed, such as taking too much of a certain medicine.  What increases the risk? Certain factors can make you more likely to develop orthostatic hypotension, including:  Age. Risk increases as you get older.  Conditions that affect the heart or the central nervous system.  Taking certain medicines, such as blood pressure medicine or diuretics.  Being pregnant.  What are the signs or symptoms? Symptoms of this condition may  include:  Weakness.  Light-headedness.  Dizziness.  Blurred vision.  Fatigue.  Rapid heartbeat.  Fainting, in severe cases.  How is this diagnosed? This condition is diagnosed based on:  Your medical history.  Your symptoms.  Your blood pressure measurement. Your health care provider will check your blood pressure when you are: ? Lying down. ? Sitting. ? Standing.  A blood pressure reading is recorded as two numbers, such as "120 over 80" (or 120/80). The first ("top") number is called the systolic pressure. It is a measure of the pressure in your arteries as your heart beats. The second ("bottom") number is called the diastolic pressure. It is a measure of the pressure in your arteries when your heart relaxes between beats. Blood pressure is measured in a unit called mm Hg. Healthy blood pressure for adults is 120/80. If your blood pressure is below 90/60, you may be diagnosed with hypotension. Other information or tests that may be used to diagnose orthostatic hypotension include:  Your other vital signs, such as your heart rate and temperature.  Blood tests.  Tilt table test. For this test, you will be safely secured to a table that moves you from a lying position to an upright position. Your heart rhythm and blood pressure will be monitored during the test.  How is this treated? Treatment for this condition may include:  Changing your diet. This may involve eating more salt (sodium) or drinking more water.  Taking medicines to raise your blood pressure.  Changing the dosage of certain medicines you are taking that might be lowering your blood pressure.  Wearing compression stockings. These stockings help to prevent blood clots and  reduce swelling in your legs.  In some cases, you may need to go to the hospital for:  Fluid replacement. This means you will receive fluids through an IV tube.  Blood replacement. This means you will receive donated blood through an  IV tube (transfusion).  Treating an infection or heart problems, if this applies.  Monitoring. You may need to be monitored while medicines that you are taking wear off.  Follow these instructions at home: Eating and drinking   Drink enough fluid to keep your urine clear or pale yellow.  Eat a healthy diet and follow instructions from your health care provider about eating or drinking restrictions. A healthy diet includes: ? Fresh fruits and vegetables. ? Whole grains. ? Lean meats. ? Low-fat dairy products.  Eat extra salt only as directed. Do not add extra salt to your diet unless your health care provider told you to do that.  Eat frequent, small meals.  Avoid standing up suddenly after eating. Medicines  Take over-the-counter and prescription medicines only as told by your health care provider. ? Follow instructions from your health care provider about changing the dosage of your current medicines, if this applies. ? Do not stop or adjust any of your medicines on your own. General instructions  Wear compression stockings as told by your health care provider.  Get up slowly from lying down or sitting positions. This gives your blood pressure a chance to adjust.  Avoid hot showers and excessive heat as directed by your health care provider.  Return to your normal activities as told by your health care provider. Ask your health care provider what activities are safe for you.  Do not use any products that contain nicotine or tobacco, such as cigarettes and e-cigarettes. If you need help quitting, ask your health care provider.  Keep all follow-up visits as told by your health care provider. This is important. Contact a health care provider if:  You vomit.  You have diarrhea.  You have a fever for more than 2-3 days.  You feel more thirsty than usual.  You feel weak and tired. Get help right away if:  You have chest pain.  You have a fast or irregular  heartbeat.  You develop numbness in any part of your body.  You cannot move your arms or your legs.  You have trouble speaking.  You become sweaty or feel lightheaded.  You faint.  You feel short of breath.  You have trouble staying awake.  You feel confused. This information is not intended to replace advice given to you by your health care provider. Make sure you discuss any questions you have with your health care provider. Document Released: 05/04/2002 Document Revised: 01/31/2016 Document Reviewed: 11/04/2015 Elsevier Interactive Patient Education  2018 [employer].

## 2017-11-14 NOTE — Progress Notes (Signed)
Patient: Jessica BurrowRhoda M Waldroup Female    DOB: 1945-08-14   72 y.o.   MRN: [id number] Visit Date: 11/14/2017  Today's Provider: Margaretann LovelessJennifer M Burnette, PA-C   Chief Complaint  Patient presents with  . Follow-up   Subjective:    HPI  Low blood pressure, follow-up:  BP Readings from Last 3 Encounters:  11/14/17 120/84  11/07/17 (!) 84/60  09/06/17 (!) 149/100    She was last seen for low blood pressure 1 weeks ago.  BP at that visit was 84/60. Management since that visit includes decrease Atenolol to 50mg  and continue with Lisinopril. She reports excellent compliance with treatment. She is not having side effects.  Outside blood pressures are not being checked. She is experiencing dizziness. Dizziness is improved but does occur occasionally with going from sitting to standing. Patient denies chest pain.   Cardiovascular risk factors include advanced age (older than 7255 for men, 8065 for women), dyslipidemia and hypertension.     Weight trend: stable Wt Readings from Last 3 Encounters:  11/14/17 155 lb (70.3 kg)  11/07/17 156 lb 12.8 oz (71.1 kg)  09/05/17 170 lb (77.1 kg)   ------------------------------------------------------------------------     No Known Allergies   Current Outpatient Medications:  .  albuterol (PROVENTIL HFA;VENTOLIN HFA) 108 (90 Base) MCG/ACT inhaler, Inhale 2 puffs into the lungs every 6 (six) hours as needed for wheezing or shortness of breath., Disp: 1 Inhaler, Rfl: 0 .  aspirin 81 MG tablet, Take 81 mg by mouth daily. , Disp: , Rfl:  .  atenolol (TENORMIN) 50 MG tablet, Take 1 tablet (50 mg total) by mouth daily., Disp: 90 tablet, Rfl: 3 .  lisinopril (PRINIVIL,ZESTRIL) 5 MG tablet, Take 1 tablet (5 mg total) by mouth daily., Disp: 90 tablet, Rfl: 3 .  Melatonin 5 MG CAPS, Take by mouth at bedtime as needed. , Disp: , Rfl:  .  nortriptyline (PAMELOR) 50 MG capsule, Take 50 mg by mouth at bedtime. , Disp: , Rfl:  .  oxybutynin (DITROPAN) 5 MG  tablet, Take 1 tablet (5 mg total) by mouth every evening., Disp: 90 tablet, Rfl: 3 .  perphenazine (TRILAFON) 4 MG tablet, TAKE 1 TABLET BY MOUTH AT BEDTIME, Disp: 90 tablet, Rfl: 1 .  simvastatin (ZOCOR) 20 MG tablet, Take 1 tablet (20 mg total) by mouth daily., Disp: 90 tablet, Rfl: 3  Review of Systems  Constitutional: Negative.   Respiratory: Negative.   Cardiovascular: Negative.   Gastrointestinal: Negative.   Neurological: Positive for dizziness.    Social History   Tobacco Use  . Smoking status: Never Smoker  . Smokeless tobacco: Never Used  Substance Use Topics  . Alcohol use: No   Objective:   BP 120/84 (BP Location: Left Arm, Patient Position: Sitting, Cuff Size: Normal)   Pulse 64   Temp 97.8 F (36.6 C) (Oral)   Resp 16   Ht 5\' 6"  (1.676 m)   Wt 155 lb (70.3 kg)   SpO2 97%   BMI 25.02 kg/m    Physical Exam  Constitutional: She appears well-developed and well-nourished. No distress.  Neck: Normal range of motion. Neck supple.  Cardiovascular: Normal rate, regular rhythm and normal heart sounds. Exam reveals no gallop and no friction rub.  No murmur heard. Pulmonary/Chest: Effort normal and breath sounds normal. No respiratory distress. She has no wheezes. She has no rales.  Skin: She is not diaphoretic.  Vitals reviewed.      Assessment & Plan:  1. Orthostatic hypotension Improved BP with atenolol 50mg  daily and lisinopril 5mg  daily. Continue to push fluids. Discussed standing slowly and waiting before moving. She is to call if symptoms continue or worsen over the next 2 weeks and may consider decreasing atenolol to 25mg  if still occurring. She is in agreement.       Margaretann Loveless, PA-C  [facility]

## 2017-12-10 ENCOUNTER — Ambulatory Visit (INDEPENDENT_AMBULATORY_CARE_PROVIDER_SITE_OTHER): Payer: PPO

## 2017-12-10 VITALS — BP 126/70 | HR 65 | Temp 97.9°F | Ht 66.0 in | Wt 156.2 lb

## 2017-12-10 DIAGNOSIS — Z0000 Encounter for general adult medical examination without abnormal findings: Secondary | ICD-10-CM | POA: Diagnosis not present

## 2017-12-10 NOTE — Patient Instructions (Signed)
[name] , Thank you for taking time to come for your Medicare Wellness Visit. I appreciate your ongoing commitment to your health goals. Please review the following plan we discussed and let me know if I can assist you in the future.   Screening recommendations/referrals: Colonoscopy: Up to date Mammogram: Up to date Bone Density: Up to date Recommended yearly ophthalmology/optometry visit for glaucoma screening and checkup Recommended yearly dental visit for hygiene and checkup  Vaccinations: Influenza vaccine: N/A Pneumococcal vaccine: Up to date Tdap vaccine: Pt declines today.  Shingles vaccine: Pt declines today.     Advanced directives: Please bring a copy of your POA (Power of Attorney) and/or Living Will to your next appointment.   Conditions/risks identified: Fall risk prevenetion; Recommend increasing water intake to 3 glasses a day.   Next appointment: 01/28/18 @ 2 PM with Jessica Little. Pt declined scheduling the AWV for 2020.   Preventive Care 70 Years and Older, Female Preventive care refers to lifestyle choices and visits with your health care provider that can promote health and wellness. What does preventive care include?  A yearly physical exam. This is also called an annual well check.  Dental exams once or twice a year.  Routine eye exams. Ask your health care provider how often you should have your eyes checked.  Personal lifestyle choices, including:  Daily care of your teeth and gums.  Regular physical activity.  Eating a healthy diet.  Avoiding tobacco and drug use.  Limiting alcohol use.  Practicing safe sex.  Taking low-dose aspirin every day.  Taking vitamin and mineral supplements as recommended by your health care provider. What happens during an annual well check? The services and screenings done by your health care provider during your annual well check will depend on your age, overall health, lifestyle risk factors, and family  history of disease. Counseling  Your health care provider may ask you questions about your:  Alcohol use.  Tobacco use.  Drug use.  Emotional well-being.  Home and relationship well-being.  Sexual activity.  Eating habits.  History of falls.  Memory and ability to understand (cognition).  Work and work Astronomerenvironment.  Reproductive health. Screening  You may have the following tests or measurements:  Height, weight, and BMI.  Blood pressure.  Lipid and cholesterol levels. These may be checked every 5 years, or more frequently if you are over 70 years old.  Skin check.  Lung cancer screening. You may have this screening every year starting at age 70 if you have a 30-pack-year history of smoking and currently smoke or have quit within the past 15 years.  Fecal occult blood test (FOBT) of the stool. You may have this test every year starting at age 70.  Flexible sigmoidoscopy or colonoscopy. You may have a sigmoidoscopy every 5 years or a colonoscopy every 10 years starting at age 70.  Hepatitis C blood test.  Hepatitis B blood test.  Sexually transmitted disease (STD) testing.  Diabetes screening. This is done by checking your blood sugar (glucose) after you have not eaten for a while (fasting). You may have this done every 1-3 years.  Bone density scan. This is done to screen for osteoporosis. You may have this done starting at age 665.  Mammogram. This may be done every 1-2 years. Talk to your health care provider about how often you should have regular mammograms. Talk with your health care provider about your test results, treatment options, and if necessary, the need for more  tests. Vaccines  Your health care provider may recommend certain vaccines, such as:  Influenza vaccine. This is recommended every year.  Tetanus, diphtheria, and acellular pertussis (Tdap, Td) vaccine. You may need a Td booster every 10 years.  Zoster vaccine. You may need this after  age 33.  Pneumococcal 13-valent conjugate (PCV13) vaccine. One dose is recommended after age 76.  Pneumococcal polysaccharide (PPSV23) vaccine. One dose is recommended after age 65. Talk to your health care provider about which screenings and vaccines you need and how often you need them. This information is not intended to replace advice given to you by your health care provider. Make sure you discuss any questions you have with your health care provider. Document Released: 06/10/2015 Document Revised: 02/01/2016 Document Reviewed: 03/15/2015 Elsevier Interactive Patient Education  2017 [employer] Prevention in the Home Falls can cause injuries. They can happen to people of all ages. There are many things you can do to make your home safe and to help prevent falls. What can I do on the outside of my home?  Regularly fix the edges of walkways and driveways and fix any cracks.  Remove anything that might make you trip as you walk through a door, such as a raised step or threshold.  Trim any bushes or trees on the path to your home.  Use bright outdoor lighting.  Clear any walking paths of anything that might make someone trip, such as rocks or tools.  Regularly check to see if handrails are loose or broken. Make sure that both sides of any steps have handrails.  Any raised decks and porches should have guardrails on the edges.  Have any leaves, snow, or ice cleared regularly.  Use sand or salt on walking paths during winter.  Clean up any spills in your garage right away. This includes oil or grease spills. What can I do in the bathroom?  Use night lights.  Install grab bars by the toilet and in the tub and shower. Do not use towel bars as grab bars.  Use non-skid mats or decals in the tub or shower.  If you need to sit down in the shower, use a plastic, non-slip stool.  Keep the floor dry. Clean up any water that spills on the floor as soon as it  happens.  Remove soap buildup in the tub or shower regularly.  Attach bath mats securely with double-sided non-slip rug tape.  Do not have throw rugs and other things on the floor that can make you trip. What can I do in the bedroom?  Use night lights.  Make sure that you have a light by your bed that is easy to reach.  Do not use any sheets or blankets that are too big for your bed. They should not hang down onto the floor.  Have a firm chair that has side arms. You can use this for support while you get dressed.  Do not have throw rugs and other things on the floor that can make you trip. What can I do in the kitchen?  Clean up any spills right away.  Avoid walking on wet floors.  Keep items that you use a lot in easy-to-reach places.  If you need to reach something above you, use a strong step stool that has a grab bar.  Keep electrical cords out of the way.  Do not use floor polish or wax that makes floors slippery. If you must use wax, use non-skid floor  wax.  Do not have throw rugs and other things on the floor that can make you trip. What can I do with my stairs?  Do not leave any items on the stairs.  Make sure that there are handrails on both sides of the stairs and use them. Fix handrails that are broken or loose. Make sure that handrails are as long as the stairways.  Check any carpeting to make sure that it is firmly attached to the stairs. Fix any carpet that is loose or worn.  Avoid having throw rugs at the top or bottom of the stairs. If you do have throw rugs, attach them to the floor with carpet tape.  Make sure that you have a light switch at the top of the stairs and the bottom of the stairs. If you do not have them, ask someone to add them for you. What else can I do to help prevent falls?  Wear shoes that:  Do not have high heels.  Have rubber bottoms.  Are comfortable and fit you well.  Are closed at the toe. Do not wear sandals.  If you  use a stepladder:  Make sure that it is fully opened. Do not climb a closed stepladder.  Make sure that both sides of the stepladder are locked into place.  Ask someone to hold it for you, if possible.  Clearly mark and make sure that you can see:  Any grab bars or handrails.  First and last steps.  Where the edge of each step is.  Use tools that help you move around (mobility aids) if they are needed. These include:  Canes.  Walkers.  Scooters.  Crutches.  Turn on the lights when you go into a dark area. Replace any light bulbs as soon as they burn out.  Set up your furniture so you have a clear path. Avoid moving your furniture around.  If any of your floors are uneven, fix them.  If there are any pets around you, be aware of where they are.  Review your medicines with your doctor. Some medicines can make you feel dizzy. This can increase your chance of falling. Ask your doctor what other things that you can do to help prevent falls. This information is not intended to replace advice given to you by your health care provider. Make sure you discuss any questions you have with your health care provider. Document Released: 03/10/2009 Document Revised: 10/20/2015 Document Reviewed: 06/18/2014 Elsevier Interactive Patient Education  2017 [employer].

## 2017-12-10 NOTE — Progress Notes (Signed)
Subjective:   Jessica Little is a 72 y.o. female who presents for Medicare Annual (Subsequent) preventive examination.  Review of Systems:  N/A  Cardiac Risk Factors include: advanced age (>4755men, 65>65 women);dyslipidemia;hypertension     Objective:     Vitals: BP 126/70 (BP Location: Right Arm)   Pulse 65   Temp 97.9 F (36.6 C) (Oral)   Ht 5\' 6"  (1.676 m)   Wt 156 lb 3.2 oz (70.9 kg)   BMI 25.21 kg/m   Body mass index is 25.21 kg/m.  Advanced Directives 12/10/2017 10/05/2016 08/23/2015 07/06/2015  Does Patient Have a Medical Advance Directive? Yes Yes Yes Yes  Type of Estate agentAdvance Directive Healthcare Power [place];Living will Living will Living will Healthcare Power of Attorney  Copy of Healthcare Power of Attorney in Chart? No - copy requested - No - copy requested -    Tobacco Social History   Tobacco Use  Smoking Status Never Smoker  Smokeless Tobacco Never Used     Counseling given: Not Answered   Clinical Intake:  Pre-visit preparation completed: Yes  Pain : No/denies pain Pain Score: 0-No pain     Nutritional Status: BMI 25 -29 Overweight Nutritional Risks: Nausea/ vomitting/ diarrhea(nausea and vomiting occasionally due to certain foods.) Diabetes: No  How often do you need to have someone help you when you read instructions, pamphlets, or other written materials from your doctor or pharmacy?: 1 - Never  Interpreter Needed?: No  Information entered by :: [facility]cMmarkoski, LPN  Past Medical History:  Diagnosis Date  . Anxiety   . GERD (gastroesophageal reflux disease)   . Hyperlipidemia   . Hypertension    Past Surgical History:  Procedure Laterality Date  . CHOLECYSTECTOMY    . COLONOSCOPY  01/01/2006  . COLONOSCOPY WITH PROPOFOL N/A 08/23/2015   Procedure: COLONOSCOPY WITH PROPOFOL;  Surgeon: Earline MayotteJeffrey W Byrnett, MD;  [facility];  Service: Endoscopy;  Laterality: N/A;   Family History  Problem Relation Age of Onset  . Arrhythmia  Mother   . Heart attack Father   . Stroke Father   . Healthy Sister   . Healthy Brother   . Healthy Sister   . Breast cancer Neg Hx    Social History   Socioeconomic History  . Marital status: Widowed    Spouse name: Not on file  . Number of children: 0  . Years of education: College  . Highest education level: Bachelor's degree (e.g., BA, AB, BS)  Occupational History  . Occupation: Retired  Engineer, productionocial Needs  . Financial resource strain: Not hard at all  . Food insecurity:    Worry: Never true    Inability: Never true  . Transportation needs:    Medical: No    Non-medical: No  Tobacco Use  . Smoking status: Never Smoker  . Smokeless tobacco: Never Used  Substance and Sexual Activity  . Alcohol use: No  . Drug use: No  . Sexual activity: Not on file  Lifestyle  . Physical activity:    Days per week: Not on file    Minutes per session: Not on file  . Stress: Not at all  Relationships  . Social connections:    Talks on phone: Not on file    Gets together: Not on file    Attends religious service: Not on file    Active member of club or organization: Not on file    Attends meetings of clubs or organizations: Not on file  Relationship status: Not on file  Other Topics Concern  . Not on file  Social History Narrative  . Not on file    Outpatient Encounter Medications as of 12/10/2017  Medication Sig  . atenolol (TENORMIN) 50 MG tablet Take 1 tablet (50 mg total) by mouth daily.  Marland Kitchen lisinopril (PRINIVIL,ZESTRIL) 5 MG tablet Take 1 tablet (5 mg total) by mouth daily.  . Melatonin 5 MG CAPS Take by mouth at bedtime as needed.   . nortriptyline (PAMELOR) 50 MG capsule Take 50 mg by mouth at bedtime.   Marland Kitchen oxybutynin (DITROPAN) 5 MG tablet Take 1 tablet (5 mg total) by mouth every evening.  Marland Kitchen perphenazine (TRILAFON) 4 MG tablet TAKE 1 TABLET BY MOUTH AT BEDTIME  . simvastatin (ZOCOR) 20 MG tablet Take 1 tablet (20 mg total) by mouth daily.  Marland Kitchen albuterol (PROVENTIL  HFA;VENTOLIN HFA) 108 (90 Base) MCG/ACT inhaler Inhale 2 puffs into the lungs every 6 (six) hours as needed for wheezing or shortness of breath. (Patient not taking: Reported on 12/10/2017)  . aspirin 81 MG tablet Take 81 mg by mouth daily.    No facility-administered encounter medications on file as of 12/10/2017.     Activities of Daily Living In your present state of health, do you have any difficulty performing the following activities: 12/10/2017 11/07/2017  Hearing? N N  Vision? N N  Difficulty concentrating or making decisions? N N  Walking or climbing stairs? N N  Dressing or bathing? N N  Doing errands, shopping? N N  Preparing Food and eating ? N -  Using the Toilet? N -  In the past six months, have you accidently leaked urine? N -  Do you have problems with loss of bowel control? N -  Managing your Medications? N -  Managing your Finances? N -  Housekeeping or managing your Housekeeping? N -  Some recent data might be hidden    Patient Care Team: Reine Just as PCP - General (Family Medicine) Lemar Livings Merrily Pew, MD (General Surgery) Cottle, Steva Ready., MD as Attending Physician (Psychiatry)    Assessment:   This is a routine wellness examination for Jessica Little.  Exercise Activities and Dietary recommendations Current Exercise Habits: The patient does not participate in regular exercise at present, Exercise limited by: None identified  Goals    . DIET - INCREASE WATER INTAKE     Recommend increasing water intake to 3 glasses a day.        Fall Risk Fall Risk  12/10/2017 11/07/2017 10/05/2016 07/06/2015  Falls in the past year? Yes No Yes No  Number falls in past yr: 2 or more - 2 or more -  Comment related to dizzy spells - - -  Injury with Fall? No - No -  Follow up Falls prevention discussed - Falls prevention discussed -   Is the patient's home free of loose throw rugs in walkways, pet beds, electrical cords, etc?   yes      Grab bars in the  bathroom? yes      Handrails on the stairs?   yes      Adequate lighting?   yes  Timed Get Up and Go performed: N/A  Depression Screen PHQ 2/9 Scores 12/10/2017 11/07/2017 10/05/2016 10/05/2016  PHQ - 2 Score 0 1 0 0  PHQ- 9 Score - - 1 -     Cognitive Function: Pt declined screening today.      6CIT Screen 10/05/2016  What  Year? 0 points  What month? 0 points  What time? 0 points  Count back from 20 0 points  Months in reverse 0 points  Repeat phrase 2 points  Total Score 2    Immunization History  Administered Date(s) Administered  . Hepatitis A 08/13/2001  . Pneumococcal Conjugate-13 07/06/2015  . Pneumococcal Polysaccharide-23 10/05/2016  . Td 10/24/2000    Qualifies for Shingles Vaccine? Due for Shingles vaccine. Declined my offer to administer today. Education has been provided regarding the importance of this vaccine. Pt has been advised to call her insurance company to determine her out of pocket expense. Advised she may also receive this vaccine at her local pharmacy or Health Dept. Verbalized acceptance and understanding.  Screening Tests Health Maintenance  Topic Date Due  . TETANUS/TDAP  05/28/2026 (Originally 10/25/2010)  . MAMMOGRAM  01/03/2019  . COLONOSCOPY  08/22/2025  . DEXA SCAN  Completed  . Hepatitis C Screening  Completed  . PNA vac Low Risk Adult  Completed    Cancer Screenings: Lung: Low Dose CT Chest recommended if Age 51-80 years, 30 pack-year currently smoking OR have quit w/in 15years. Patient does not qualify. Breast:  Up to date on Mammogram? Yes   Up to date of Bone Density/Dexa? Yes Colorectal: Up to date  Additional Screenings:  Hepatitis C Screening: Up to date     Plan:  I have personally reviewed and addressed the Medicare Annual Wellness questionnaire and have noted the following in the patient's chart:  A. Medical and social history B. Use of alcohol, tobacco or illicit drugs  C. Current medications and  supplements D. Functional ability and status E.  Nutritional status F.  Physical activity G. Advance directives H. List of other physicians I.  Hospitalizations, surgeries, and ER visits in previous 12 months J.  Vitals K. Screenings such as hearing and vision if needed, cognitive and depression L. Referrals and appointments - none  In addition, I have reviewed and discussed with patient certain preventive protocols, quality metrics, and best practice recommendations. A written personalized care plan for preventive services as well as general preventive health recommendations were provided to patient.  See attached scanned questionnaire for additional information.   Signed,  Hyacinth Meeker, LPN Nurse Health Advisor   Nurse Recommendations: None. Pt declined the tetanus vaccine today.

## 2018-01-23 ENCOUNTER — Other Ambulatory Visit: Payer: Self-pay | Admitting: Physician Assistant

## 2018-01-28 ENCOUNTER — Ambulatory Visit (INDEPENDENT_AMBULATORY_CARE_PROVIDER_SITE_OTHER): Payer: PPO | Admitting: Physician Assistant

## 2018-01-28 ENCOUNTER — Encounter: Payer: Self-pay | Admitting: Physician Assistant

## 2018-01-28 VITALS — BP 138/80 | HR 63 | Resp 16 | Ht 66.0 in | Wt 148.2 lb

## 2018-01-28 DIAGNOSIS — G479 Sleep disorder, unspecified: Secondary | ICD-10-CM | POA: Diagnosis not present

## 2018-01-28 DIAGNOSIS — Z1239 Encounter for other screening for malignant neoplasm of breast: Secondary | ICD-10-CM

## 2018-01-28 DIAGNOSIS — E7800 Pure hypercholesterolemia, unspecified: Secondary | ICD-10-CM

## 2018-01-28 DIAGNOSIS — Z2821 Immunization not carried out because of patient refusal: Secondary | ICD-10-CM

## 2018-01-28 DIAGNOSIS — Z1231 Encounter for screening mammogram for malignant neoplasm of breast: Secondary | ICD-10-CM | POA: Diagnosis not present

## 2018-01-28 DIAGNOSIS — Z0000 Encounter for general adult medical examination without abnormal findings: Secondary | ICD-10-CM

## 2018-01-28 DIAGNOSIS — E041 Nontoxic single thyroid nodule: Secondary | ICD-10-CM

## 2018-01-28 DIAGNOSIS — Z1159 Encounter for screening for other viral diseases: Secondary | ICD-10-CM

## 2018-01-28 MED ORDER — TRAZODONE HCL 50 MG PO TABS: 25.0000 mg | ORAL_TABLET | Freq: Every evening | ORAL | 0 refills | Status: DC | PRN

## 2018-01-28 NOTE — Progress Notes (Signed)
Patient: Jessica Little, Female    DOB: [birth year], 72 y.o.   MRN: [id number] Visit Date: 01/28/2018  Today's Provider: Margaretann Loveless, PA-C   Chief Complaint  Patient presents with  . Annual Exam   Subjective:     Complete Physical Jessica Little is a 72 y.o. female. She feels well. She reports exercising none. She reports she is sleeping fairly well, reports she has trouble falling asleep. She takes Melatonin and this does not help.   Declined Td/Tdap:12/10/17 Declined Influenza Vaccine today -----------------------------------------------------------   Review of Systems  Constitutional: Positive for fatigue.  HENT: Negative.   Eyes: Negative.   Respiratory: Negative.   Cardiovascular: Negative.   Gastrointestinal: Positive for diarrhea ("occasionally").  Endocrine: Negative.   Genitourinary: Negative.   Musculoskeletal: Negative.   Skin: Negative.   Allergic/Immunologic: Negative.   Neurological: Positive for dizziness and light-headedness.  Hematological: Negative.   Psychiatric/Behavioral: Positive for sleep disturbance.    Social History   Socioeconomic History  . Marital status: Widowed    Spouse name: Not on file  . Number of children: 0  . Years of education: College  . Highest education level: Bachelor's degree (e.g., BA, AB, BS)  Occupational History  . Occupation: Retired  Engineer, production  . Financial resource strain: Not hard at all  . Food insecurity:    Worry: Never true    Inability: Never true  . Transportation needs:    Medical: No    Non-medical: No  Tobacco Use  . Smoking status: Never Smoker  . Smokeless tobacco: Never Used  Substance and Sexual Activity  . Alcohol use: No  . Drug use: No  . Sexual activity: Not on file  Lifestyle  . Physical activity:    Days per week: Not on file    Minutes per session: Not on file  . Stress: Not at all  Relationships  . Social connections:    Talks on phone: Not on file    Gets  together: Not on file    Attends religious service: Not on file    Active member of club or organization: Not on file    Attends meetings of clubs or organizations: Not on file    Relationship status: Not on file  . Intimate partner violence:    Fear of current or ex partner: Not on file    Emotionally abused: Not on file    Physically abused: Not on file    Forced sexual activity: Not on file  Other Topics Concern  . Not on file  Social History Narrative  . Not on file    Past Medical History:  Diagnosis Date  . Anxiety   . GERD (gastroesophageal reflux disease)   . Hyperlipidemia   . Hypertension      Patient Active Problem List   Diagnosis Date Noted  . Osteopenia 10/10/2016  . H/O urinary frequency 07/06/2015  . Anxiety 02/03/2015  . Clinical depression 02/03/2015  . Acid reflux 02/03/2015  . HLD (hyperlipidemia) 02/03/2015  . BP (high blood pressure) 02/03/2015  . Cannot sleep 02/03/2015  . Dislocated shoulder 02/03/2015  . Thyroid nodule 02/03/2015    Past Surgical History:  Procedure Laterality Date  . CHOLECYSTECTOMY    . COLONOSCOPY  01/01/2006  . COLONOSCOPY WITH PROPOFOL N/A 08/23/2015   Procedure: COLONOSCOPY WITH PROPOFOL;  Surgeon: Earline Mayotte, MD;  Location: [facility] ENDOSCOPY;  Service: Endoscopy;  Laterality: N/A;    Her family history includes  Arrhythmia in her mother; Healthy in her brother, sister, and sister; Heart attack in her father; Stroke in her father. There is no history of Breast cancer.      Current Outpatient Medications:  .  atenolol (TENORMIN) 50 MG tablet, Take 1 tablet (50 mg total) by mouth daily., Disp: 90 tablet, Rfl: 3 .  lisinopril (PRINIVIL,ZESTRIL) 5 MG tablet, Take 1 tablet (5 mg total) by mouth daily., Disp: 90 tablet, Rfl: 3 .  Melatonin 5 MG CAPS, Take by mouth at bedtime as needed. , Disp: , Rfl:  .  nortriptyline (PAMELOR) 50 MG capsule, Take 50 mg by mouth at bedtime. , Disp: , Rfl:  .  perphenazine (TRILAFON) 4 MG  tablet, TAKE 1 TABLET BY MOUTH AT BEDTIME, Disp: 90 tablet, Rfl: 1 .  simvastatin (ZOCOR) 20 MG tablet, Take 1 tablet (20 mg total) by mouth daily., Disp: 90 tablet, Rfl: 3 .  albuterol (PROVENTIL HFA;VENTOLIN HFA) 108 (90 Base) MCG/ACT inhaler, Inhale 2 puffs into the lungs every 6 (six) hours as needed for wheezing or shortness of breath. (Patient not taking: Reported on 12/10/2017), Disp: 1 Inhaler, Rfl: 0 .  aspirin 81 MG tablet, Take 81 mg by mouth daily. , Disp: , Rfl:  .  oxybutynin (DITROPAN) 5 MG tablet, Take 1 tablet (5 mg total) by mouth every evening. (Patient not taking: Reported on 01/28/2018), Disp: 90 tablet, Rfl: 3  Patient Care Team: Margaretann Loveless, PA-C as PCP - General (Family Medicine) Lemar Livings, Merrily Pew, MD (General Surgery) Cottle, Steva Ready., MD as Attending Physician (Psychiatry)     Objective:   Vitals: BP 138/80 (BP Location: Left Arm, Patient Position: Sitting, Cuff Size: Normal)   Pulse 63   Resp 16   Ht 5\' 6"  (1.676 m)   Wt 148 lb 3.2 oz (67.2 kg)   BMI 23.92 kg/m   Physical Exam  Constitutional: She is oriented to person, place, and time. She appears well-developed and well-nourished.  HENT:  Head: Normocephalic and atraumatic.  Right Ear: Hearing, tympanic membrane, external ear and ear canal normal.  Left Ear: Hearing, tympanic membrane, external ear and ear canal normal.  Nose: Nose normal.  Mouth/Throat: Uvula is midline, oropharynx is clear and moist and mucous membranes are normal.  Eyes: Pupils are equal, round, and reactive to light. Conjunctivae and EOM are normal.  Neck: Normal range of motion. Carotid bruit is not present.  Cardiovascular: Normal rate, regular rhythm, normal heart sounds and intact distal pulses.  Pulmonary/Chest: Effort normal and breath sounds normal. Right breast exhibits no inverted nipple, no mass, no nipple discharge, no skin change and no tenderness. Left breast exhibits no inverted nipple, no mass, no nipple  discharge, no skin change and no tenderness. No breast swelling, tenderness, discharge or bleeding. Breasts are symmetrical.  Abdominal: Soft. Bowel sounds are normal.  Musculoskeletal: Normal range of motion.  Neurological: She is alert and oriented to person, place, and time.  Skin: Skin is warm.  Psychiatric: She has a normal mood and affect. Her behavior is normal. Judgment and thought content normal.  Vitals reviewed.   Activities of Daily Living In your present state of health, do you have any difficulty performing the following activities: 12/10/2017 11/07/2017  Hearing? N N  Vision? N N  Difficulty concentrating or making decisions? N N  Walking or climbing stairs? N N  Dressing or bathing? N N  Doing errands, shopping? N N  Preparing Food and eating ? N -  Using the  Toilet? N -  In the past six months, have you accidently leaked urine? N -  Do you have problems with loss of bowel control? N -  Managing your Medications? N -  Managing your Finances? N -  Housekeeping or managing your Housekeeping? N -  Some recent data might be hidden    Fall Risk Assessment Fall Risk  01/28/2018 12/10/2017 11/07/2017 10/05/2016 07/06/2015  Falls in the past year? Yes Yes No Yes No  Number falls in past yr: 2 or more 2 or more - 2 or more -  Comment 3-4 times becuase of her dizziness related to dizzy spells - - -  Injury with Fall? No No - No -  Follow up - Falls prevention discussed - Falls prevention discussed -     Depression Screen PHQ 2/9 Scores 12/10/2017 11/07/2017 10/05/2016 10/05/2016  PHQ - 2 Score 0 1 0 0  PHQ- 9 Score - - 1 -    Cognitive Testing - 6-CIT:Pt declined screening 12/10/17    Assessment & Plan:    Annual Physical Reviewed patient's Family Medical History Reviewed and updated list of patient's medical providers Assessment of cognitive impairment was done Assessed patient's functional ability Established a written schedule for health screening services Health  Risk Assessent Completed and Reviewed  Exercise Activities and Dietary recommendations Goals    . DIET - INCREASE WATER INTAKE     Recommend increasing water intake to 3 glasses a day.     . Increase water intake     Recommend increasing water intake to 4 glasses a day.       Immunization History  Administered Date(s) Administered  . Hepatitis A 08/13/2001  . Pneumococcal Conjugate-13 07/06/2015  . Pneumococcal Polysaccharide-23 10/05/2016  . Td 10/24/2000    Health Maintenance  Topic Date Due  . TETANUS/TDAP  05/28/2026 (Originally 10/25/2010)  . MAMMOGRAM  01/03/2019  . COLONOSCOPY  08/22/2025  . DEXA SCAN  Completed  . Hepatitis C Screening  Completed  . PNA vac Low Risk Adult  Completed     Discussed health benefits of physical activity, and encouraged her to engage in regular exercise appropriate for her age and condition.    1. Annual physical exam Normal physical exam today. Will check labs as below and f/u pending lab results. If labs are stable and WNL she will not need to have these rechecked for one year at her next annual physical exam. She is to call the office in the meantime if she has any acute issue, questions or concerns. - CBC with Differential/Platelet - Comprehensive metabolic panel - Hemoglobin A1c  2. Breast cancer screening Breast exam today was normal. There is no family history of breast cancer. She does perform regular self breast exams. Mammogram was ordered as below. Information for [facility] clinic was given to patient so she may schedule her mammogram at her convenience. - MM 3D SCREEN BREAST BILATERAL; Future  3. Pure hypercholesterolemia Will check labs as below and f/u pending results. - Lipid panel  4. Thyroid nodule Stable. Will check labs as below and f/u pending results. - TSH  5. Difficulty sleeping Stable. Diagnosis pulled for medication refill. Continue current medical treatment plan. - traZODone (DESYREL) 50 MG  tablet; Take 0.5-1 tablets (25-50 mg total) by mouth at bedtime as needed for sleep.  Dispense: 30 tablet; Refill: 0  6. Need for hepatitis C screening test - Hepatitis C antibody  7. Influenza vaccination declined  ------------------------------------------------------------------------------------------------------------    Victorino Dike  Dorothy Puffer, PA-C  [facility]

## 2018-01-28 NOTE — Patient Instructions (Addendum)
Trazodone tablets What is this medicine? TRAZODONE (TRAZ oh done) is used to treat depression. This medicine may be used for other purposes; ask your health care provider or pharmacist if you have questions. COMMON BRAND NAME(S): Desyrel What should I tell my health care provider before I take this medicine? They need to know if you have any of these conditions: -attempted suicide or thinking about it -bipolar disorder -bleeding problems -glaucoma -heart disease, or previous heart attack -irregular heart beat -kidney or liver disease -low levels of sodium in the blood -an unusual or allergic reaction to trazodone, other medicines, foods, dyes or preservatives -pregnant or trying to get pregnant -breast-feeding How should I use this medicine? Take this medicine by mouth with a glass of water. Follow the directions on the prescription label. Take this medicine shortly after a meal or a light snack. Take your medicine at regular intervals. Do not take your medicine more often than directed. Do not stop taking this medicine suddenly except upon the advice of your doctor. Stopping this medicine too quickly may cause serious side effects or your condition may worsen. A special MedGuide will be given to you by the pharmacist with each prescription and refill. Be sure to read this information carefully each time. Talk to your pediatrician regarding the use of this medicine in children. Special care may be needed. Overdosage: If you think you have taken too much of this medicine contact a poison control center or emergency room at once. NOTE: This medicine is only for you. Do not share this medicine with others. What if I miss a dose? If you miss a dose, take it as soon as you can. If it is almost time for your next dose, take only that dose. Do not take double or extra doses. What may interact with this medicine? Do not take this medicine with any of the following medications: -certain medicines  for fungal infections like fluconazole, itraconazole, ketoconazole, posaconazole, voriconazole -cisapride -dofetilide -dronedarone -linezolid -MAOIs like Carbex, Eldepryl, Marplan, Nardil, and Parnate -mesoridazine -methylene blue (injected into a vein) -pimozide -saquinavir -thioridazine -ziprasidone This medicine may also interact with the following medications: -alcohol -antiviral medicines for HIV or AIDS -aspirin and aspirin-like medicines -barbiturates like phenobarbital -certain medicines for blood pressure, heart disease, irregular heart beat -certain medicines for depression, anxiety, or psychotic disturbances -certain medicines for migraine headache like almotriptan, eletriptan, frovatriptan, naratriptan, rizatriptan, sumatriptan, zolmitriptan -certain medicines for seizures like carbamazepine and phenytoin -certain medicines for sleep -certain medicines that treat or prevent blood clots like dalteparin, enoxaparin, warfarin -digoxin -fentanyl -lithium -NSAIDS, medicines for pain and inflammation, like ibuprofen or naproxen -other medicines that prolong the QT interval (cause an abnormal heart rhythm) -rasagiline -supplements like St. John's wort, kava kava, valerian -tramadol -tryptophan This list may not describe all possible interactions. Give your health care provider a list of all the medicines, herbs, non-prescription drugs, or dietary supplements you use. Also tell them if you smoke, drink alcohol, or use illegal drugs. Some items may interact with your medicine. What should I watch for while using this medicine? Tell your doctor if your symptoms do not get better or if they get worse. Visit your doctor or health care professional for regular checks on your progress. Because it may take several weeks to see the full effects of this medicine, it is important to continue your treatment as prescribed by your doctor. Patients and their families should watch out for new  or worsening thoughts of suicide or depression. Also   watch out for sudden changes in feelings such as feeling anxious, agitated, panicky, irritable, hostile, aggressive, impulsive, severely restless, overly excited and hyperactive, or not being able to sleep. If this happens, especially at the beginning of treatment or after a change in dose, call your health care professional. You may get drowsy or dizzy. Do not drive, use machinery, or do anything that needs mental alertness until you know how this medicine affects you. Do not stand or sit up quickly, especially if you are an older patient. This reduces the risk of dizzy or fainting spells. Alcohol may interfere with the effect of this medicine. Avoid alcoholic drinks. This medicine may cause dry eyes and blurred vision. If you wear contact lenses you may feel some discomfort. Lubricating drops may help. See your eye doctor if the problem does not go away or is severe. Your mouth may get dry. Chewing sugarless gum, sucking hard candy and drinking plenty of water may help. Contact your doctor if the problem does not go away or is severe. What side effects may I notice from receiving this medicine? Side effects that you should report to your doctor or health care professional as soon as possible: -allergic reactions like skin rash, itching or hives, swelling of the face, lips, or tongue -elevated mood, decreased need for sleep, racing thoughts, impulsive behavior -confusion -fast, irregular heartbeat -feeling faint or lightheaded, falls -feeling agitated, angry, or irritable -loss of balance or coordination -painful or prolonged erections -restlessness, pacing, inability to keep still -suicidal thoughts or other mood changes -tremors -trouble sleeping -seizures -unusual bleeding or bruising Side effects that usually do not require medical attention (report to your doctor or health care professional if they continue or are bothersome): -change in  sex drive or performance -change in appetite or weight -constipation -headache -muscle aches or pains -nausea This list may not describe all possible side effects. Call your doctor for medical advice about side effects. You may report side effects to FDA at 1-800-FDA-1088. Where should I keep my medicine? Keep out of the reach of children. Store at room temperature between 15 and 30 degrees C (59 to 86 degrees F). Protect from light. Keep container tightly closed. Throw away any unused medicine after the expiration date. NOTE: This sheet is a summary. It may not cover all possible information. If you have questions about this medicine, talk to your doctor, pharmacist, or health care provider.  2018 Elsevier/Gold Standard (2015-10-13 16:57:05)  

## 2018-01-30 DIAGNOSIS — E041 Nontoxic single thyroid nodule: Secondary | ICD-10-CM | POA: Diagnosis not present

## 2018-01-30 DIAGNOSIS — E7800 Pure hypercholesterolemia, unspecified: Secondary | ICD-10-CM | POA: Diagnosis not present

## 2018-01-30 DIAGNOSIS — Z1159 Encounter for screening for other viral diseases: Secondary | ICD-10-CM | POA: Diagnosis not present

## 2018-01-30 DIAGNOSIS — Z0000 Encounter for general adult medical examination without abnormal findings: Secondary | ICD-10-CM | POA: Diagnosis not present

## 2018-01-31 LAB — CBC WITH DIFFERENTIAL/PLATELET
BASOS: 0 %
Basophils Absolute: 0 10*3/uL (ref 0.0–0.2)
EOS (ABSOLUTE): 0.2 10*3/uL (ref 0.0–0.4)
EOS: 2 %
HEMATOCRIT: 40.4 % (ref 34.0–46.6)
Hemoglobin: 13.6 g/dL (ref 11.1–15.9)
IMMATURE GRANS (ABS): 0 10*3/uL (ref 0.0–0.1)
Immature Granulocytes: 0 %
LYMPHS ABS: 3.6 10*3/uL — AB (ref 0.7–3.1)
LYMPHS: 48 %
MCH: 25.9 pg — AB (ref 26.6–33.0)
MCHC: 33.7 g/dL (ref 31.5–35.7)
MCV: 77 fL — ABNORMAL LOW (ref 79–97)
MONOCYTES: 9 %
Monocytes Absolute: 0.7 10*3/uL (ref 0.1–0.9)
NEUTROS ABS: 3.1 10*3/uL (ref 1.4–7.0)
Neutrophils: 41 %
Platelets: 236 10*3/uL (ref 150–450)
RBC: 5.25 x10E6/uL (ref 3.77–5.28)
RDW: 16.4 % — ABNORMAL HIGH (ref 12.3–15.4)
WBC: 7.6 10*3/uL (ref 3.4–10.8)

## 2018-01-31 LAB — COMPREHENSIVE METABOLIC PANEL
ALK PHOS: 87 IU/L (ref 39–117)
ALT: 13 IU/L (ref 0–32)
AST: 18 IU/L (ref 0–40)
Albumin/Globulin Ratio: 1.6 (ref 1.2–2.2)
Albumin: 4.4 g/dL (ref 3.5–4.8)
BILIRUBIN TOTAL: 0.6 mg/dL (ref 0.0–1.2)
BUN/Creatinine Ratio: 11 — ABNORMAL LOW (ref 12–28)
BUN: 13 mg/dL (ref 8–27)
CO2: 24 mmol/L (ref 20–29)
CREATININE: 1.2 mg/dL — AB (ref 0.57–1.00)
Calcium: 9.3 mg/dL (ref 8.7–10.3)
Chloride: 98 mmol/L (ref 96–106)
GFR calc Af Amer: 53 mL/min/{1.73_m2} — ABNORMAL LOW (ref >59)
GFR, EST NON AFRICAN AMERICAN: 46 mL/min/{1.73_m2} — AB (ref >59)
GLOBULIN, TOTAL: 2.8 g/dL (ref 1.5–4.5)
Glucose: 109 mg/dL — ABNORMAL HIGH (ref 65–99)
Potassium: 3.4 mmol/L — ABNORMAL LOW (ref 3.5–5.2)
Sodium: 142 mmol/L (ref 134–144)
TOTAL PROTEIN: 7.2 g/dL (ref 6.0–8.5)

## 2018-01-31 LAB — HEPATITIS C ANTIBODY: Hep C Virus Ab: 0.1 s/co ratio (ref 0.0–0.9)

## 2018-01-31 LAB — LIPID PANEL
CHOLESTEROL TOTAL: 171 mg/dL (ref 100–199)
Chol/HDL Ratio: 3.2 ratio (ref 0.0–4.4)
HDL: 54 mg/dL (ref >39)
LDL Calculated: 96 mg/dL (ref 0–99)
Triglycerides: 105 mg/dL (ref 0–149)
VLDL CHOLESTEROL CAL: 21 mg/dL (ref 5–40)

## 2018-01-31 LAB — TSH: TSH: 2.85 u[IU]/mL (ref 0.450–4.500)

## 2018-01-31 LAB — HEMOGLOBIN A1C
ESTIMATED AVERAGE GLUCOSE: 117 mg/dL
HEMOGLOBIN A1C: 5.7 % — AB (ref 4.8–5.6)

## 2018-02-04 ENCOUNTER — Telehealth: Payer: Self-pay

## 2018-02-04 NOTE — Telephone Encounter (Signed)
Patient advised as directed below.  Thanks,  -Shewanda Sharpe 

## 2018-02-04 NOTE — Telephone Encounter (Signed)
-----   Message from Margaretann Loveless, PA-C sent at 02/04/2018 10:16 AM EDT ----- Potassium borderline low. Recommend adding potassium rich foods in diet such as bananas, sweet potatoes, broccoli and/or leafy greens (spinach, kale). All other labs are stable.

## 2018-02-14 ENCOUNTER — Ambulatory Visit
Admission: RE | Admit: 2018-02-14 | Discharge: 2018-02-14 | Disposition: A | Discharge status: Home / Self Care | Payer: PPO | Source: Ambulatory Visit | Attending: Physician Assistant | Admitting: Physician Assistant

## 2018-02-14 DIAGNOSIS — Z1239 Encounter for other screening for malignant neoplasm of breast: Secondary | ICD-10-CM

## 2018-02-14 DIAGNOSIS — Z1231 Encounter for screening mammogram for malignant neoplasm of breast: Secondary | ICD-10-CM | POA: Insufficient documentation

## 2018-02-14 IMAGING — MG MM DIGITAL SCREENING BILAT W/ TOMO W/ CAD
8 series · 8 of 24 positions shown · non-contrast
Comparison: Previous exam(s).

CLINICAL DATA: Screening.

EXAM:
DIGITAL SCREENING BILATERAL MAMMOGRAM WITH TOMO AND CAD

[R CC synth-2D]
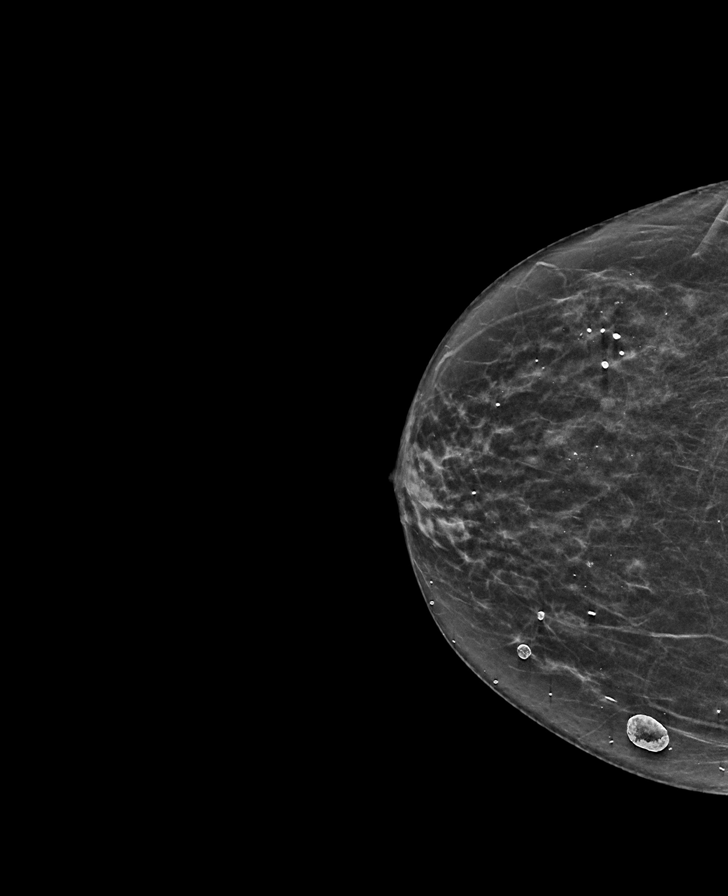

[L CC synth-2D]
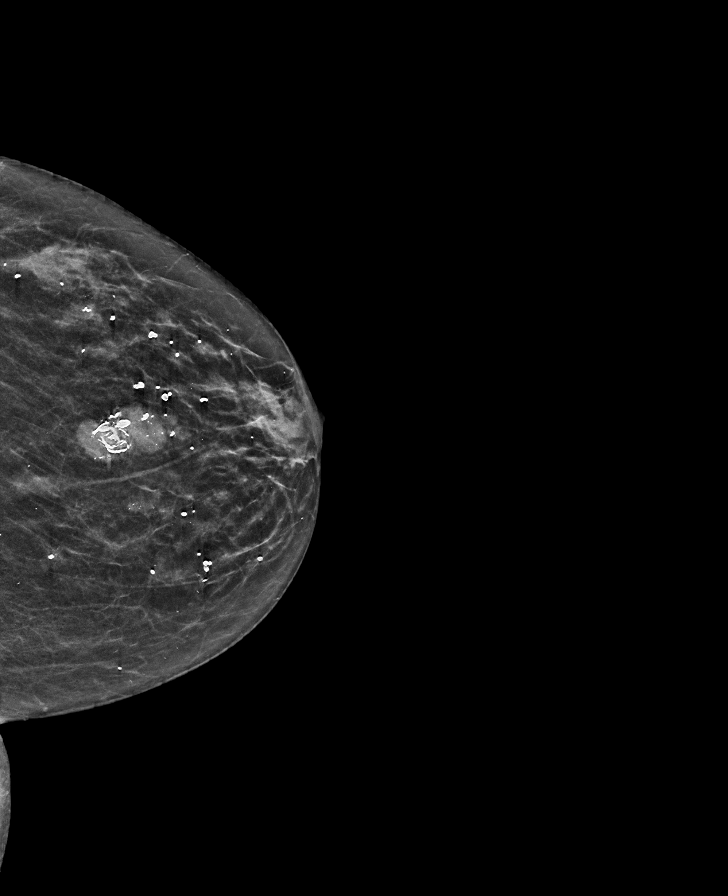

[L MLO synth-2D]
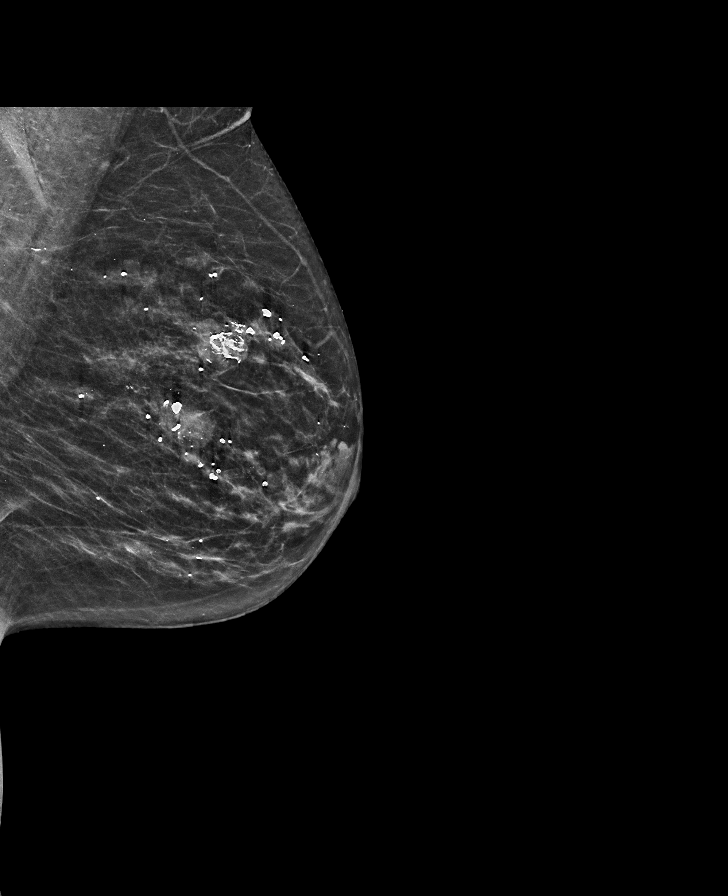

[R MLO synth-2D]
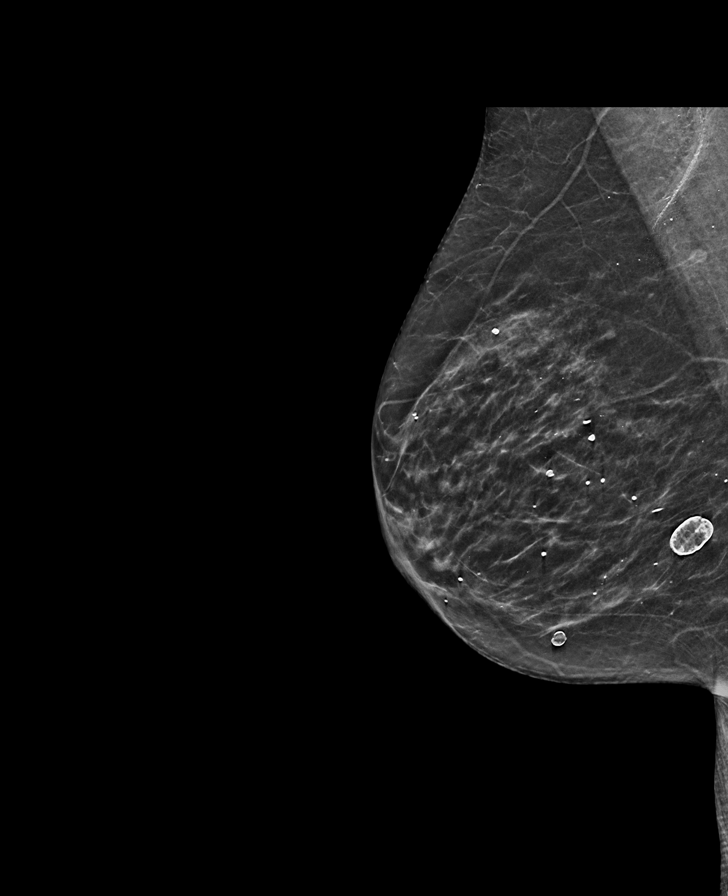

[R CC tomo · tomo slice 27/54.0]
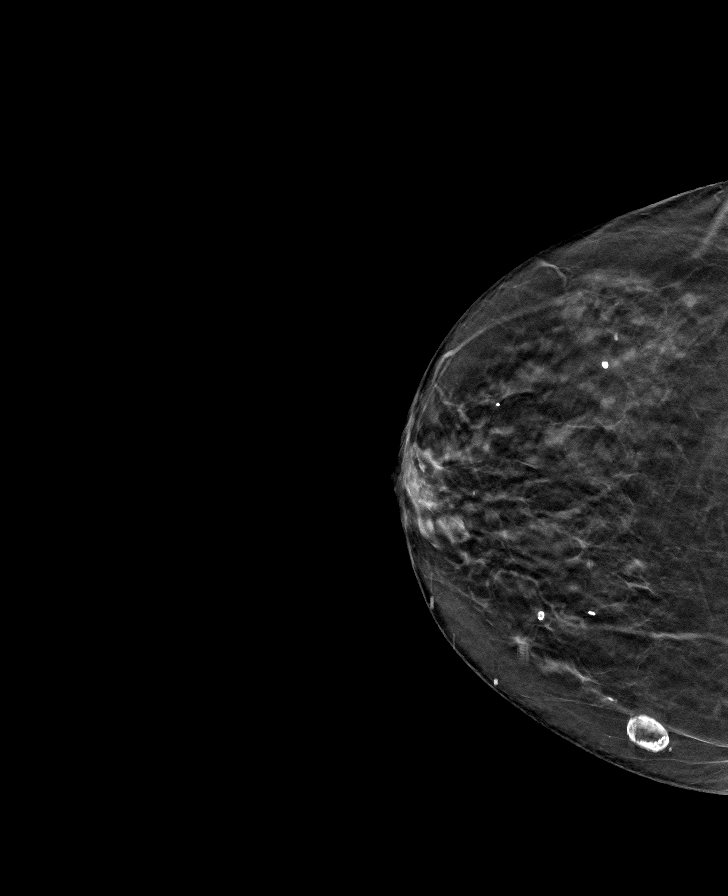

[L MLO tomo · tomo slice 28/55.0]
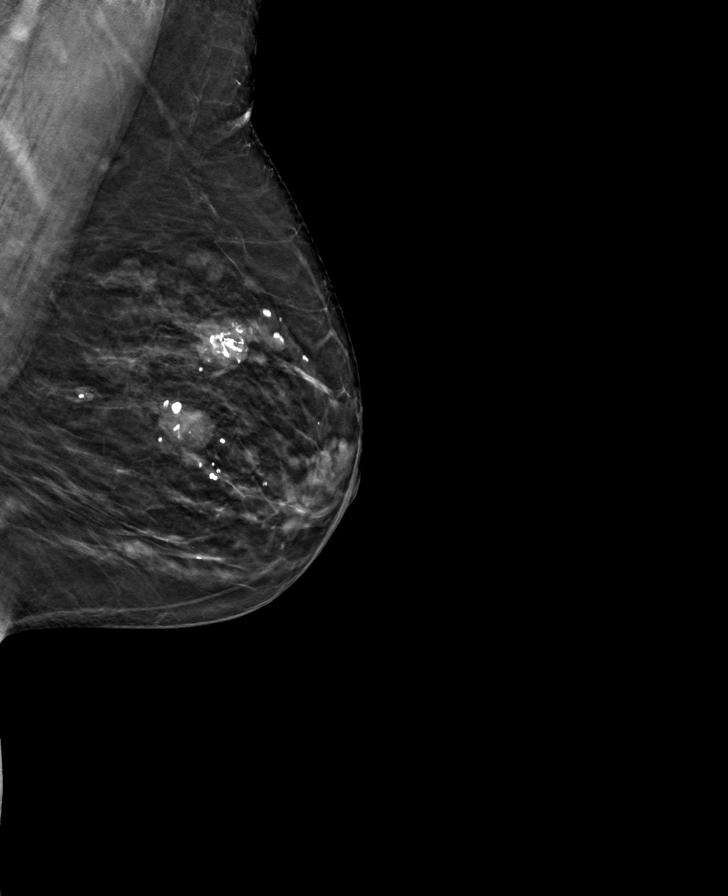

[L CC tomo · tomo slice 27/54.0]
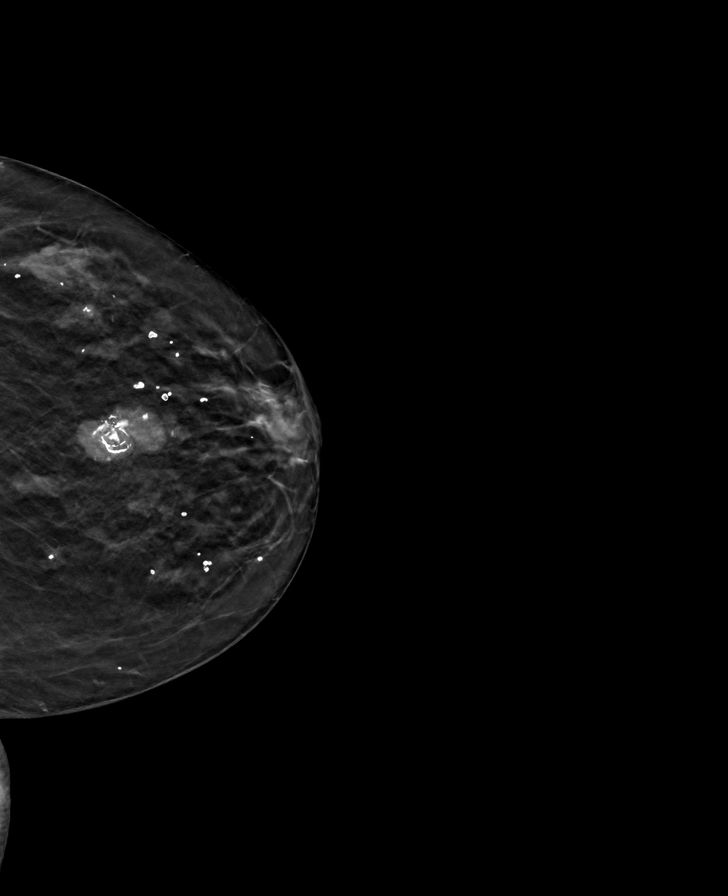

[R MLO tomo · tomo slice 27/54.0]
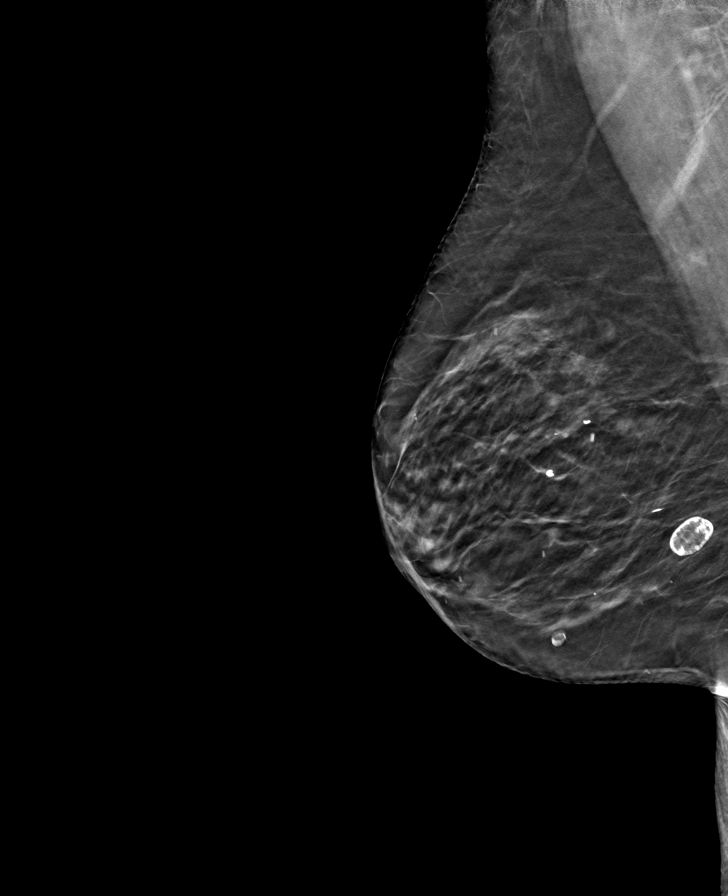

[8 of 24 positions shown; findings below may reference images not displayed]

ACR Breast Density Category b: There are scattered areas of
fibroglandular density.
FINDINGS: There are no findings suspicious for malignancy. Images were
processed with CAD.
IMPRESSION: No mammographic evidence of malignancy. A result letter of this
screening mammogram will be mailed directly to the patient.

RECOMMENDATION:
Screening mammogram in one year. (Code:[TQ])

BI-RADS CATEGORY  1: Negative.

## 2018-02-25 ENCOUNTER — Ambulatory Visit (INDEPENDENT_AMBULATORY_CARE_PROVIDER_SITE_OTHER): Payer: PPO | Admitting: Physician Assistant

## 2018-02-25 ENCOUNTER — Encounter: Payer: Self-pay | Admitting: Physician Assistant

## 2018-02-25 VITALS — BP 140/98 | HR 62 | Temp 97.9°F | Resp 18 | Wt 148.8 lb

## 2018-02-25 DIAGNOSIS — Z2821 Immunization not carried out because of patient refusal: Secondary | ICD-10-CM | POA: Diagnosis not present

## 2018-02-25 DIAGNOSIS — E876 Hypokalemia: Secondary | ICD-10-CM | POA: Diagnosis not present

## 2018-02-25 DIAGNOSIS — R42 Dizziness and giddiness: Secondary | ICD-10-CM

## 2018-02-25 DIAGNOSIS — G479 Sleep disorder, unspecified: Secondary | ICD-10-CM | POA: Diagnosis not present

## 2018-02-25 DIAGNOSIS — I10 Essential (primary) hypertension: Secondary | ICD-10-CM

## 2018-02-25 MED ORDER — TRAZODONE HCL 50 MG PO TABS: 25.0000 mg | ORAL_TABLET | Freq: Every evening | ORAL | 1 refills | Status: DC | PRN

## 2018-02-25 NOTE — Progress Notes (Signed)
Patient: Jessica Little Female    DOB: 02-Jun-1945   72 y.o.   MRN: [id number] Visit Date: 02/25/2018  Today's Provider: Margaretann Loveless, PA-C   Chief Complaint  Patient presents with  . Follow-up    Difficulty sleeping   Subjective:    HPI Patient here for her 4 weeks follow-up. Patient was advised to continue Trazodone 50mg . Patient reports that her sleep quality is better. Reports no side effect from the medication.   Patient also reports that her blood pressure at home have been running a little high. She also reports that she is still having some dizziness but this is improving and she is taking her time with changing positions.   Patient Declined Influenza vaccine.  No Known Allergies   Current Outpatient Medications:  .  atenolol (TENORMIN) 50 MG tablet, Take 1 tablet (50 mg total) by mouth daily., Disp: 90 tablet, Rfl: 3 .  nortriptyline (PAMELOR) 50 MG capsule, Take 50 mg by mouth at bedtime. , Disp: , Rfl:  .  perphenazine (TRILAFON) 4 MG tablet, TAKE 1 TABLET BY MOUTH AT BEDTIME, Disp: 90 tablet, Rfl: 1 .  simvastatin (ZOCOR) 20 MG tablet, Take 1 tablet (20 mg total) by mouth daily., Disp: 90 tablet, Rfl: 3 .  traZODone (DESYREL) 50 MG tablet, Take 0.5-1 tablets (25-50 mg total) by mouth at bedtime as needed for sleep., Disp: 30 tablet, Rfl: 0 .  albuterol (PROVENTIL HFA;VENTOLIN HFA) 108 (90 Base) MCG/ACT inhaler, Inhale 2 puffs into the lungs every 6 (six) hours as needed for wheezing or shortness of breath. (Patient not taking: Reported on 12/10/2017), Disp: 1 Inhaler, Rfl: 0 .  aspirin 81 MG tablet, Take 81 mg by mouth daily. , Disp: , Rfl:  .  oxybutynin (DITROPAN) 5 MG tablet, Take 1 tablet (5 mg total) by mouth every evening. (Patient not taking: Reported on 01/28/2018), Disp: 90 tablet, Rfl: 3  Review of Systems  Constitutional: Negative.   Respiratory: Negative.   Cardiovascular: Negative for chest pain.  Gastrointestinal: Negative.   Musculoskeletal:  Negative.   Neurological: Positive for dizziness.  Psychiatric/Behavioral: Negative.     Social History   Tobacco Use  . Smoking status: Never Smoker  . Smokeless tobacco: Never Used  Substance Use Topics  . Alcohol use: No   Objective:   BP (!) 140/98 (BP Location: Left Arm, Patient Position: Sitting, Cuff Size: Normal)   Pulse 62   Temp 97.9 F (36.6 C) (Oral)   Resp 18   Wt 148 lb 12.8 oz (67.5 kg)   BMI 24.02 kg/m  Vitals:   02/25/18 1403  BP: (!) 140/98  Pulse: 62  Resp: 18  Temp: 97.9 F (36.6 C)  TempSrc: Oral  Weight: 148 lb 12.8 oz (67.5 kg)     Physical Exam  Constitutional: She is oriented to person, place, and time. She appears well-developed and well-nourished. No distress.  Neck: Normal range of motion. Neck supple.  Cardiovascular: Normal rate, regular rhythm and normal heart sounds. Exam reveals no gallop and no friction rub.  No murmur heard. Pulmonary/Chest: Effort normal and breath sounds normal. No respiratory distress. She has no wheezes. She has no rales.  Musculoskeletal: She exhibits no edema.  Neurological: She is alert and oriented to person, place, and time. No cranial nerve deficit. Coordination normal.  Skin: She is not diaphoretic.  Vitals reviewed.      Assessment & Plan:     1. Essential hypertension Continue atenolol  50mg . May add the 25mg  atenolol on days that BP is 140/90 or greater. Continue to push fluids. I will see her back in 6 months.   2. Hypokalemia Declined labs today to recheck. Discussed dietary additions to increase potassium.   3. Difficulty sleeping Continue Trazodone as this is working to help her sleep.   4. Dizziness Sounds like orthostasis. Push fluids, take time between changing position.   5. Influenza vaccination declined       Margaretann Loveless, PA-C  [facility]

## 2018-03-11 ENCOUNTER — Encounter: Payer: Self-pay | Admitting: Physician Assistant

## 2018-03-11 DIAGNOSIS — K440 Diaphragmatic hernia with obstruction, without gangrene: Secondary | ICD-10-CM | POA: Diagnosis not present

## 2018-03-11 DIAGNOSIS — Z8719 Personal history of other diseases of the digestive system: Secondary | ICD-10-CM | POA: Diagnosis not present

## 2018-03-11 DIAGNOSIS — R05 Cough: Secondary | ICD-10-CM | POA: Diagnosis not present

## 2018-03-11 DIAGNOSIS — K449 Diaphragmatic hernia without obstruction or gangrene: Secondary | ICD-10-CM | POA: Diagnosis not present

## 2018-03-11 DIAGNOSIS — R911 Solitary pulmonary nodule: Secondary | ICD-10-CM | POA: Diagnosis not present

## 2018-03-11 DIAGNOSIS — R918 Other nonspecific abnormal finding of lung field: Secondary | ICD-10-CM | POA: Diagnosis not present

## 2018-04-08 DIAGNOSIS — I10 Essential (primary) hypertension: Secondary | ICD-10-CM | POA: Diagnosis not present

## 2018-04-08 DIAGNOSIS — E785 Hyperlipidemia, unspecified: Secondary | ICD-10-CM | POA: Diagnosis not present

## 2018-04-08 DIAGNOSIS — T182XXA Foreign body in stomach, initial encounter: Secondary | ICD-10-CM | POA: Diagnosis not present

## 2018-04-08 DIAGNOSIS — Z978 Presence of other specified devices: Secondary | ICD-10-CM | POA: Diagnosis not present

## 2018-04-08 DIAGNOSIS — Z4659 Encounter for fitting and adjustment of other gastrointestinal appliance and device: Secondary | ICD-10-CM | POA: Diagnosis not present

## 2018-05-02 ENCOUNTER — Other Ambulatory Visit: Payer: Self-pay

## 2018-05-02 DIAGNOSIS — F251 Schizoaffective disorder, depressive type: Secondary | ICD-10-CM

## 2018-05-02 MED ORDER — PERPHENAZINE 4 MG PO TABS: ORAL_TABLET | ORAL | 1 refills | Status: DC

## 2018-05-02 MED ORDER — NORTRIPTYLINE HCL 50 MG PO CAPS: 50.0000 mg | ORAL_CAPSULE | Freq: Every day | ORAL | 1 refills | Status: DC

## 2018-05-06 DIAGNOSIS — K219 Gastro-esophageal reflux disease without esophagitis: Secondary | ICD-10-CM | POA: Diagnosis not present

## 2018-05-06 DIAGNOSIS — K449 Diaphragmatic hernia without obstruction or gangrene: Secondary | ICD-10-CM | POA: Diagnosis not present

## 2018-05-06 DIAGNOSIS — K440 Diaphragmatic hernia with obstruction, without gangrene: Secondary | ICD-10-CM | POA: Diagnosis not present

## 2018-05-06 DIAGNOSIS — R1310 Dysphagia, unspecified: Secondary | ICD-10-CM | POA: Diagnosis not present

## 2018-05-06 DIAGNOSIS — J90 Pleural effusion, not elsewhere classified: Secondary | ICD-10-CM | POA: Diagnosis not present

## 2018-05-16 ENCOUNTER — Encounter: Payer: Self-pay | Admitting: Physician Assistant

## 2018-05-16 ENCOUNTER — Ambulatory Visit (INDEPENDENT_AMBULATORY_CARE_PROVIDER_SITE_OTHER): Payer: PPO | Admitting: Physician Assistant

## 2018-05-16 VITALS — BP 127/75 | HR 90 | Temp 97.7°F | Resp 16 | Wt 153.0 lb

## 2018-05-16 DIAGNOSIS — K2100 Gastro-esophageal reflux disease with esophagitis, without bleeding: Secondary | ICD-10-CM

## 2018-05-16 MED ORDER — OMEPRAZOLE 40 MG PO CPDR: 40.0000 mg | DELAYED_RELEASE_CAPSULE | Freq: Two times a day (BID) | ORAL | 1 refills | Status: DC

## 2018-05-16 NOTE — Patient Instructions (Signed)
Omeprazole tablets (OTC)  What is this medicine?  OMEPRAZOLE (oh ME pray zol) prevents the production of acid in the stomach. It is used to treat the symptoms of heartburn. You can buy this medicine without a prescription. This product is not for long-term use, unless otherwise directed by your doctor or health care professional.  This medicine may be used for other purposes; ask your health care provider or pharmacist if you have questions.  COMMON BRAND NAME(S): Prilosec OTC  What should I tell my health care provider before I take this medicine?  They need to know if you have any of these conditions:  -black or bloody stools  -chest pain  -difficulty swallowing  -have had heartburn for over 3 months  -have heartburn with dizziness, lightheadedness or sweating  -liver disease  -lupus  -stomach pain  -unexplained weight loss  -vomiting with blood  -wheezing  -an unusual or allergic reaction to omeprazole, other medicines, foods, dyes, or preservatives  -pregnant or trying to get pregnant  -breast-feeding  How should I use this medicine?  Take this medicine by mouth. Follow the directions on the product label. If you are taking this medicine without a prescription, take one tablet every day. Do not use for longer than 14 days or repeat a course of treatment more often than every 4 months unless directed by a doctor or healthcare professional. Take your dose at regular intervals every 24 hours. Swallow the tablet whole with a drink of water. Do not crush, break or chew. This medicine works best if taken on an empty stomach 30 minutes before breakfast. If you are using this medicine with the prescription of your doctor or healthcare professional, follow the directions you were given. Do not take your medicine more often than directed.  Talk to your pediatrician regarding the use of this medicine in children. Special care may be needed.  Overdosage: If you think you have taken too much of this medicine contact a poison  control center or emergency room at once.  NOTE: This medicine is only for you. Do not share this medicine with others.  What if I miss a dose?  If you miss a dose, take it as soon as you can. If it is almost time for your next dose, take only that dose. Do not take double or extra doses.  What may interact with this medicine?  Do not take this medicine with any of the following medications:  -atazanavir  -clopidogrel  -nelfinavir  This medicine may also interact with the following medications:  -ampicillin  -certain medicines for anxiety or sleep  -certain medicines that treat or prevent blood clots like warfarin  -cyclosporine  -diazepam  -digoxin  -disulfiram  -iron salts  -methotrexate  -mycophenolate mofetil  -phenytoin  -prescription medicine for fungal or yeast infection like itraconazole, ketoconazole, voriconazole  -saquinavir  -tacrolimus  This list may not describe all possible interactions. Give your health care provider a list of all the medicines, herbs, non-prescription drugs, or dietary supplements you use. Also tell them if you smoke, drink alcohol, or use illegal drugs. Some items may interact with your medicine.  What should I watch for while using this medicine?  It can take several days before your heartburn gets better. Check with your doctor or health care professional if your condition does not start to get better, or if it gets worse.  Do not treat diarrhea with over the counter products. Contact your doctor if you have diarrhea that   lasts more than 2 days or if it is severe and watery.  Do not treat yourself for heartburn with this medicine for more than 14 days in a row. You should only use this medicine for a 2-week treatment period once every 4 months. If your symptoms return shortly after your therapy is complete, or within the 4 month time frame, call your doctor or health care professional.  This medicine may cause a decrease in vitamin B12. You should make sure that you get enough  vitamin B12 while you are taking this medicine. Discuss the foods you eat and the vitamins you take with your health care professional.  What side effects may I notice from receiving this medicine?  Side effects that you should report to your doctor or health care professional as soon as possible:  -  allergic reactions like skin rash, itching or hives, swelling of the face, lips, or tongue  -  bone, muscle or joint pain  -  breathing problems  -  chest pain or chest tightness  -  dark yellow or brown urine  -  diarrhea  -  dizziness  -  fast, irregular heartbeat  -  feeling faint or lightheaded  -  fever or sore throat  -  muscle spasm  -  palpitations  -  rash on cheeks or arms that gets worse in the sun  -  redness, blistering, peeling or loosening of the skin, including inside the mouth  -  seizures  -stomach polyps  -  tremors  -  unusual bleeding or bruising  -  unusually weak or tired  -  yellowing of the eyes or skin  Side effects that usually do not require medical attention (report to your doctor or health care professional if they continue or are bothersome):  -  constipation  -  dry mouth  -  headache  -  loose stools  -  nausea  This list may not describe all possible side effects. Call your doctor for medical advice about side effects. You may report side effects to FDA at 1-800-FDA-1088.  Where should I keep my medicine?  Keep out of the reach of children.  Store at room temperature between 20 and 25 degrees C (68 and 77 degrees F). Protect from light and moisture. Throw away any unused medicine after the expiration date.  NOTE: This sheet is a summary. It may not cover all possible information. If you have questions about this medicine, talk to your doctor, pharmacist, or health care provider.  © 2019 Elsevier/Gold Standard (2016-12-28 13:29:07)

## 2018-05-16 NOTE — Progress Notes (Signed)
       Patient: Jessica Little Female    DOB: Oct 01, 1945   72 y.o.   MRN: [id number] Visit Date: 05/16/2018  Today's Provider: Margaretann LovelessJennifer M Almir Botts, PA-C   Chief Complaint  Patient presents with  . Follow-up   Subjective:     HPI  Patient here to follow up on surgery for obstructed paraesophageal hernia repair in 09/06/17. Patient was last seen by surgeon on 05/06/18 and was advised to follow up with PCP to start a medication for acid reflux. Patient reports that she had an upper GI done on 05/08/18 that shows reflux and passage of contrast through stomach and duodenum.   No Known Allergies   Current Outpatient Medications:  .  atenolol (TENORMIN) 50 MG tablet, Take 1 tablet (50 mg total) by mouth daily., Disp: 90 tablet, Rfl: 3 .  nortriptyline (PAMELOR) 50 MG capsule, Take 1 capsule (50 mg total) by mouth at bedtime., Disp: 90 capsule, Rfl: 1 .  perphenazine (TRILAFON) 4 MG tablet, TAKE 1 TABLET BY MOUTH AT BEDTIME, Disp: 90 tablet, Rfl: 1 .  simvastatin (ZOCOR) 20 MG tablet, Take 1 tablet (20 mg total) by mouth daily., Disp: 90 tablet, Rfl: 3 .  traZODone (DESYREL) 50 MG tablet, Take 0.5-1 tablets (25-50 mg total) by mouth at bedtime as needed for sleep., Disp: 90 tablet, Rfl: 1 .  vitamin E 1000 UNIT capsule, Take by mouth., Disp: , Rfl:   Review of Systems  Constitutional: Negative.   Respiratory: Negative.   Cardiovascular: Negative.   Gastrointestinal: Negative.   Neurological: Negative.     Social History   Tobacco Use  . Smoking status: Never Smoker  . Smokeless tobacco: Never Used  Substance Use Topics  . Alcohol use: No      Objective:   BP 127/75 (BP Location: Left Arm, Patient Position: Sitting, Cuff Size: Normal)   Pulse 90   Temp 97.7 F (36.5 C) (Oral)   Resp 16   Wt 153 lb (69.4 kg)   BMI 24.69 kg/m  Vitals:   05/16/18 1054  BP: 127/75  Pulse: 90  Resp: 16  Temp: 97.7 F (36.5 C)  TempSrc: Oral  Weight: 153 lb (69.4 kg)     Physical  Exam Vitals signs reviewed.  Constitutional:      Appearance: She is well-developed.  HENT:     Head: Normocephalic and atraumatic.  Neck:     Musculoskeletal: Normal range of motion and neck supple.  Pulmonary:     Effort: Pulmonary effort is normal. No respiratory distress.  Psychiatric:        Behavior: Behavior normal.        Thought Content: Thought content normal.        Judgment: Judgment normal.         Assessment & Plan    1. Gastroesophageal reflux disease with esophagitis Restart omeprazole as below. I will see her back in 08/2018 to see how she is doing. She is to call if she has any adverse reaction or side effect.  - omeprazole (PRILOSEC) 40 MG capsule; Take 1 capsule (40 mg total) by mouth 2 (two) times daily.  Dispense: 180 capsule; Refill: 1     Margaretann LovelessJennifer M Aneta Hendershott, PA-C  [facility]

## 2018-05-25 DIAGNOSIS — F411 Generalized anxiety disorder: Secondary | ICD-10-CM | POA: Insufficient documentation

## 2018-06-19 ENCOUNTER — Encounter: Payer: Self-pay | Admitting: Psychiatry

## 2018-06-19 ENCOUNTER — Ambulatory Visit: Payer: PPO | Admitting: Psychiatry

## 2018-06-19 DIAGNOSIS — F4001 Agoraphobia with panic disorder: Secondary | ICD-10-CM | POA: Diagnosis not present

## 2018-06-19 DIAGNOSIS — F411 Generalized anxiety disorder: Secondary | ICD-10-CM

## 2018-06-19 DIAGNOSIS — F3342 Major depressive disorder, recurrent, in full remission: Secondary | ICD-10-CM | POA: Diagnosis not present

## 2018-06-19 NOTE — Progress Notes (Signed)
Jessica BurrowRhoda M Sok [id number] [birth year] 73 y.o.  Subjective:   Patient ID:  Jessica Little is a 73 y.o. (DOB [birth year]) female.  Chief Complaint:  Chief Complaint  Patient presents with  . Follow-up    Medication Management    HPI Corene CorneaRhoda M Parilla presents to the office today for follow-up of depression with psychotic features.  Hosp April for 4 weeks after hernia surgery and then had agitated delirium.  Then rehab.  A lot of med changes.  Doing well with the med changes ok.  No panic attacks.  Not depressed.  Doing good.  No fear.  Doesn't like driving distances but did so today.  Came off lorazepam in the hospital but doesn't remember it.  No psychosis off the meds.  Weaned off perphenazine too and on less nortriptyline and doing ok.  Patient reports stable mood and denies depressed or irritable moods.  Patient denies any recent difficulty with anxiety.  Patient reports variable difficulty with sleep initiation or maintenance. Trazodone modestly helpful.   Denies appetite disturbance.  Patient reports that energy and motivation have been good.  Patient denies any difficulty with concentration.  Patient denies any suicidal ideation.   Review of Systems:  Review of Systems  Respiratory: Negative for chest tightness.   Gastrointestinal: Negative for abdominal distention.  Neurological: Positive for tremors. Negative for weakness.  Psychiatric/Behavioral: Negative for agitation, behavioral problems, confusion, decreased concentration, dysphoric mood, hallucinations, self-injury, sleep disturbance and suicidal ideas. The patient is not nervous/anxious and is not hyperactive.     Medications: I have reviewed the patient's current medications.  Current Outpatient Medications  Medication Sig Dispense Refill  . atenolol (TENORMIN) 50 MG tablet Take 1 tablet (50 mg total) by mouth daily. 90 tablet 3  . nortriptyline (PAMELOR) 50 MG capsule Take 1 capsule (50 mg total) by mouth at bedtime.  90 capsule 1  . omeprazole (PRILOSEC) 40 MG capsule Take 1 capsule (40 mg total) by mouth 2 (two) times daily. 180 capsule 1  . perphenazine (TRILAFON) 4 MG tablet TAKE 1 TABLET BY MOUTH AT BEDTIME 90 tablet 1  . Pumpkin Seed-Soy Germ (AZO BLADDER CONTROL/GO-LESS) CAPS Take by mouth.    . simvastatin (ZOCOR) 20 MG tablet Take 1 tablet (20 mg total) by mouth daily. 90 tablet 3  . traZODone (DESYREL) 50 MG tablet Take 0.5-1 tablets (25-50 mg total) by mouth at bedtime as needed for sleep. 90 tablet 1  . vitamin E 1000 UNIT capsule Take by mouth.     No current facility-administered medications for this visit.     Medication Side Effects: none  Allergies: No Known Allergies  Past Medical History:  Diagnosis Date  . Anxiety   . GERD (gastroesophageal reflux disease)   . Hyperlipidemia   . Hypertension     Family History  Problem Relation Age of Onset  . Arrhythmia Mother   . Heart attack Father   . Stroke Father   . Healthy Sister   . Healthy Brother   . Healthy Sister   . Breast cancer Neg Hx     Social History   Socioeconomic History  . Marital status: Widowed    Spouse name: Not on file  . Number of children: 0  . Years of education: College  . Highest education level: Bachelor's degree (e.g., BA, AB, BS)  Occupational History  . Occupation: Retired  Engineer, productionocial Needs  . Financial resource strain: Not hard at all  . Food insecurity:    Worry: Never  true    Inability: Never true  . Transportation needs:    Medical: No    Non-medical: No  Tobacco Use  . Smoking status: Never Smoker  . Smokeless tobacco: Never Used  Substance and Sexual Activity  . Alcohol use: No  . Drug use: No  . Sexual activity: Not on file  Lifestyle  . Physical activity:    Days per week: Not on file    Minutes per session: Not on file  . Stress: Not at all  Relationships  . Social connections:    Talks on phone: Not on file    Gets together: Not on file    Attends religious service:  Not on file    Active member of club or organization: Not on file    Attends meetings of clubs or organizations: Not on file    Relationship status: Not on file  . Intimate partner violence:    Fear of current or ex partner: Not on file    Emotionally abused: Not on file    Physically abused: Not on file    Forced sexual activity: Not on file  Other Topics Concern  . Not on file  Social History Narrative  . Not on file    Past Medical History, Surgical history, Social history, and Family history were reviewed and updated as appropriate.   Please see review of systems for further details on the patient's review from today.   Objective:   Physical Exam:  There were no vitals taken for this visit.  Physical Exam Constitutional:      General: She is not in acute distress.    Appearance: She is well-developed.  Musculoskeletal:        General: No deformity.  Neurological:     Mental Status: She is alert and oriented to person, place, and time.     Motor: No tremor.     Coordination: Coordination normal.     Gait: Gait normal.  Psychiatric:        Attention and Perception: She is attentive. She does not perceive auditory hallucinations.        Mood and Affect: Mood is not anxious or depressed. Affect is not labile, blunt, angry or inappropriate.        Speech: Speech normal.        Behavior: Behavior normal.        Thought Content: Thought content normal. Thought content is not paranoid. Thought content does not include homicidal or suicidal ideation. Thought content does not include homicidal or suicidal plan.        Cognition and Memory: Cognition normal.        Judgment: Judgment normal.     Comments: Insight fair. No auditory or visual hallucinations. No delusions.      Lab Review:     Component Value Date/Time   NA 142 01/30/2018 1018   NA 144 04/30/2014 1321   K 3.4 (L) 01/30/2018 1018   K 3.3 (L) 04/30/2014 1321   CL 98 01/30/2018 1018   CL 102 04/30/2014 1321    CO2 24 01/30/2018 1018   CO2 28 04/30/2014 1321   GLUCOSE 109 (H) 01/30/2018 1018   GLUCOSE 187 (H) 09/05/2017 2242   GLUCOSE 120 (H) 04/30/2014 1321   BUN 13 01/30/2018 1018   BUN 37 (H) 04/30/2014 1321   CREATININE 1.20 (H) 01/30/2018 1018   CREATININE 1.75 (H) 04/30/2014 1321   CALCIUM 9.3 01/30/2018 1018   CALCIUM 8.8 04/30/2014 1321  PROT 7.2 01/30/2018 1018   PROT 7.6 04/30/2014 1321   ALBUMIN 4.4 01/30/2018 1018   ALBUMIN 3.9 04/30/2014 1321   AST 18 01/30/2018 1018   AST 32 04/30/2014 1321   ALT 13 01/30/2018 1018   ALT 27 04/30/2014 1321   ALKPHOS 87 01/30/2018 1018   ALKPHOS 73 04/30/2014 1321   BILITOT 0.6 01/30/2018 1018   BILITOT 0.6 04/30/2014 1321   GFRNONAA 46 (L) 01/30/2018 1018   GFRNONAA 31 (L) 04/30/2014 1321   GFRNONAA 26 (L) 02/01/2014 1705   GFRAA 53 (L) 01/30/2018 1018   GFRAA 37 (L) 04/30/2014 1321   GFRAA 30 (L) 02/01/2014 1705       Component Value Date/Time   WBC 7.6 01/30/2018 1018   WBC 11.1 (H) 09/05/2017 2242   RBC 5.25 01/30/2018 1018   RBC 5.29 (H) 09/05/2017 2242   HGB 13.6 01/30/2018 1018   HCT 40.4 01/30/2018 1018   PLT 236 01/30/2018 1018   MCV 77 (L) 01/30/2018 1018   MCV 81 04/30/2014 1340   MCH 25.9 (L) 01/30/2018 1018   MCH 27.2 09/05/2017 2242   MCHC 33.7 01/30/2018 1018   MCHC 34.0 09/05/2017 2242   RDW 16.4 (H) 01/30/2018 1018   RDW 16.7 (H) 04/30/2014 1340   LYMPHSABS 3.6 (H) 01/30/2018 1018   LYMPHSABS 1.8 04/30/2014 1340   MONOABS 1.3 (H) 04/30/2014 1340   EOSABS 0.2 01/30/2018 1018   EOSABS 0.0 04/30/2014 1340   BASOSABS 0.0 01/30/2018 1018   BASOSABS 0.1 04/30/2014 1340    No results found for: POCLITH, LITHIUM   No results found for: PHENYTOIN, PHENOBARB, VALPROATE, CBMZ   .res Assessment: Plan:    Major depression, recurrent, full remission (HCC)  Panic disorder with agoraphobia  Generalized anxiety disorder   Patient has a history of schizoaffective disorder generalized anxiety disorder and  panic disorder and had been stable for many years.  She had a medical hospitalization for surgery and developed psychotic delirium in April.  During that time in her recovery her nortriptyline dose was reduced by 50% to 50 mg daily, her perphenazine was discontinued and she was weaned off lorazepam.  She had no occasions of relapse of psychotic symptoms since being off the perphenazine so we will leave her off that.  Her depression has been stable with a lower dose no of nortriptyline so we will would not renew that.  Her panic disorder and has not recurred so we will not restart any benzodiazepine.  She will let us know if she has any recurrence of symptoms.  Call if any relapses.  Follow-up 6 to 12 months  Meredith Staggers, MD, DFAPA  Please see After Visit Summary for patient specific instructions.  Future Appointments  Date Time Provider Department Center  08/27/2018 11:00 AM Margaretann Loveless, PA-C [facility] None    No orders of the defined types were placed in this encounter.     -------------------------------

## 2018-08-14 ENCOUNTER — Other Ambulatory Visit: Payer: Self-pay

## 2018-08-14 DIAGNOSIS — F251 Schizoaffective disorder, depressive type: Secondary | ICD-10-CM

## 2018-08-14 MED ORDER — NORTRIPTYLINE HCL 50 MG PO CAPS: 50.0000 mg | ORAL_CAPSULE | Freq: Every day | ORAL | 3 refills | Status: DC

## 2018-08-14 MED ORDER — PERPHENAZINE 4 MG PO TABS: ORAL_TABLET | ORAL | 3 refills | Status: DC

## 2018-08-14 NOTE — Progress Notes (Signed)
Refill request from [facility] for Perphenazine 4mg  and Nortiptyline 50mg   Last office visit 05/2018 Due back in 1 year.

## 2018-08-27 ENCOUNTER — Ambulatory Visit: Payer: Self-pay | Admitting: Physician Assistant

## 2018-10-24 ENCOUNTER — Other Ambulatory Visit: Payer: Self-pay

## 2018-10-24 ENCOUNTER — Encounter: Payer: Self-pay | Admitting: Physician Assistant

## 2018-10-24 ENCOUNTER — Ambulatory Visit (INDEPENDENT_AMBULATORY_CARE_PROVIDER_SITE_OTHER): Payer: PPO | Admitting: Physician Assistant

## 2018-10-24 VITALS — BP 121/78 | HR 73 | Temp 98.3°F | Resp 16 | Wt 154.6 lb

## 2018-10-24 DIAGNOSIS — I951 Orthostatic hypotension: Secondary | ICD-10-CM | POA: Diagnosis not present

## 2018-10-24 DIAGNOSIS — I10 Essential (primary) hypertension: Secondary | ICD-10-CM | POA: Diagnosis not present

## 2018-10-24 NOTE — Progress Notes (Signed)
Patient: Jessica Little Female    DOB: [birth year]   73 y.o.   MRN: [id number] Visit Date: 10/24/2018  Today's Provider: Margaretann Loveless, PA-C   Chief Complaint  Patient presents with  . Follow-up    Hypertension   Subjective:     HPI   Hypertension, follow-up:  BP Readings from Last 3 Encounters:  10/24/18 121/78  05/16/18 127/75  02/25/18 (!) 140/98    She was last seen for hypertension 6 months ago.  BP at that visit was 140/98 Management since that visit includes Continue atenolol 50mg . May add the 25mg  atenolol on days that BP is 140/90 or greater. Continue to push fluids She reports excellent compliance with treatment. She is not having side effects.  She is not exercising. She is adherent to low salt diet.   Outside blood pressures are not checking. She is experiencing dizziness off and on. With changing position, most often from sitting to standing if she has been sitting for a while.  Patient denies chest pain, chest pressure/discomfort, exertional chest pressure/discomfort, fatigue, irregular heart beat, lower extremity edema, near-syncope and palpitations.   Cardiovascular risk factors include advanced age (older than 102 for men, 50 for women), dyslipidemia and hypertension.     Weight trend: stable Wt Readings from Last 3 Encounters:  10/24/18 154 lb 9.6 oz (70.1 kg)  05/16/18 153 lb (69.4 kg)  02/25/18 148 lb 12.8 oz (67.5 kg)   Current diet: not asked ------------------------------------------------------------------------   No Known Allergies   Current Outpatient Medications:  .  atenolol (TENORMIN) 50 MG tablet, Take 1 tablet (50 mg total) by mouth daily., Disp: 90 tablet, Rfl: 3 .  nortriptyline (PAMELOR) 50 MG capsule, Take 1 capsule (50 mg total) by mouth at bedtime., Disp: 90 capsule, Rfl: 3 .  omeprazole (PRILOSEC) 40 MG capsule, Take 1 capsule (40 mg total) by mouth 2 (two) times daily., Disp: 180 capsule, Rfl: 1 .  perphenazine  (TRILAFON) 4 MG tablet, TAKE 1 TABLET BY MOUTH AT BEDTIME, Disp: 90 tablet, Rfl: 3 .  Pumpkin Seed-Soy Germ (AZO BLADDER CONTROL/GO-LESS) CAPS, Take by mouth., Disp: , Rfl:  .  simvastatin (ZOCOR) 20 MG tablet, Take 1 tablet (20 mg total) by mouth daily., Disp: 90 tablet, Rfl: 3 .  vitamin E 1000 UNIT capsule, Take by mouth., Disp: , Rfl:  .  traZODone (DESYREL) 50 MG tablet, Take 0.5-1 tablets (25-50 mg total) by mouth at bedtime as needed for sleep. (Patient not taking: Reported on 10/24/2018), Disp: 90 tablet, Rfl: 1  Review of Systems  Constitutional: Negative.   HENT: Negative.   Respiratory: Negative.   Cardiovascular: Negative.   Neurological: Positive for light-headedness.  Psychiatric/Behavioral: Negative.     Social History   Tobacco Use  . Smoking status: Never Smoker  . Smokeless tobacco: Never Used  Substance Use Topics  . Alcohol use: No      Objective:   BP 121/78 (BP Location: Left Arm, Patient Position: Sitting, Cuff Size: Large)   Pulse 73   Temp 98.3 F (36.8 C) (Oral)   Resp 16   Wt 154 lb 9.6 oz (70.1 kg)   BMI 24.95 kg/m  Vitals:   10/24/18 1443  BP: 121/78  Pulse: 73  Resp: 16  Temp: 98.3 F (36.8 C)  TempSrc: Oral  Weight: 154 lb 9.6 oz (70.1 kg)     Physical Exam Vitals signs reviewed.  Constitutional:      General: She is not  in acute distress.    Appearance: Normal appearance. She is well-developed. She is not ill-appearing or diaphoretic.  Neck:     Musculoskeletal: Normal range of motion and neck supple.     Thyroid: No thyromegaly.     Vascular: No JVD.     Trachea: No tracheal deviation.  Cardiovascular:     Rate and Rhythm: Normal rate and regular rhythm.     Pulses: Normal pulses.     Heart sounds: Normal heart sounds. No murmur. No friction rub. No gallop.   Pulmonary:     Effort: Pulmonary effort is normal. No respiratory distress.     Breath sounds: Normal breath sounds. No wheezing or rales.  Musculoskeletal:      Right lower leg: No edema.     Left lower leg: No edema.  Lymphadenopathy:     Cervical: No cervical adenopathy.  Skin:    Capillary Refill: Capillary refill takes less than 2 seconds.  Neurological:     General: No focal deficit present.     Mental Status: She is alert and oriented to person, place, and time. Mental status is at baseline.     Cranial Nerves: No cranial nerve deficit.         Assessment & Plan    1. Essential hypertension BP much improved today. Continue atenolol 50mg  daily. Push fluids for lightheadedness with standing. Change positions slowly. Discussed use of compression stockings and elevating legs to improve symptoms.   2. Orthostatic hypotension See above medical treatment plan.     Margaretann LovelessJennifer M Deysi Soldo, PA-C  [facility]

## 2018-10-24 NOTE — Patient Instructions (Signed)

## 2018-11-25 ENCOUNTER — Other Ambulatory Visit: Payer: Self-pay | Admitting: Physician Assistant

## 2018-11-25 DIAGNOSIS — E7800 Pure hypercholesterolemia, unspecified: Secondary | ICD-10-CM

## 2018-11-25 DIAGNOSIS — I10 Essential (primary) hypertension: Secondary | ICD-10-CM

## 2018-11-25 MED ORDER — ATENOLOL 50 MG PO TABS: 50.0000 mg | ORAL_TABLET | Freq: Every day | ORAL | 3 refills | Status: DC

## 2018-11-25 MED ORDER — SIMVASTATIN 20 MG PO TABS: 20.0000 mg | ORAL_TABLET | Freq: Every day | ORAL | 3 refills | Status: DC

## 2018-11-25 NOTE — Telephone Encounter (Signed)
We received a fax from after hours service stating that the patient is requesting a refill on the following medications  simvastatin (ZOCOR) 20 MG tablet  atenolol (TENORMIN) 50 MG tablet   She would like them sent to [facility] on [address]

## 2018-12-09 ENCOUNTER — Telehealth: Payer: Self-pay

## 2018-12-09 NOTE — Telephone Encounter (Signed)
Patient called requesting a refill on her Atenolol and was advised that on 11/25/2018 Jessica Little had refilled it the medication for 90 days with 3 additional refills.

## 2019-01-20 ENCOUNTER — Other Ambulatory Visit: Payer: Self-pay | Admitting: Physician Assistant

## 2019-01-20 DIAGNOSIS — Z1231 Encounter for screening mammogram for malignant neoplasm of breast: Secondary | ICD-10-CM

## 2019-02-04 NOTE — Progress Notes (Signed)
Subjective:   Jessica Little is a 73 y.o. female who presents for Medicare Annual (Subsequent) preventive examination.    This visit is being conducted through telemedicine due to the COVID-19 pandemic. This patient has given me verbal consent via doximity to conduct this visit, patient states they are participating from their home address. Some vital signs may be absent or patient reported.    Patient identification: identified by name, DOB, and current address  Review of Systems:  N/A  Cardiac Risk Factors include: advanced age (>5855men, 83>65 women);dyslipidemia;hypertension     Objective:     Vitals: There were no vitals taken for this visit.  There is no height or weight on file to calculate BMI. Unable to obtain vitals due to visit being conducted via telephonically.   Advanced Directives 02/05/2019 12/10/2017 10/05/2016 08/23/2015 07/06/2015  Does Patient Have a Medical Advance Directive? Yes Yes Yes Yes Yes  Type of Estate agentAdvance Directive Healthcare Power of [place]orney;Living will Healthcare Power of [place]orney;Living will Living will Living will Healthcare Power of Attorney  Copy of Healthcare Power of Attorney in Chart? No - copy requested No - copy requested - No - copy requested -    Tobacco Social History   Tobacco Use  Smoking Status Never Smoker  Smokeless Tobacco Never Used     Counseling given: Not Answered   Clinical Intake:  Pre-visit preparation completed: Yes  Pain : No/denies pain Pain Score: 0-No pain     Nutritional Risks: None Diabetes: No  How often do you need to have someone help you when you read instructions, pamphlets, or other written materials from your doctor or pharmacy?: 1 - Never  Interpreter Needed?: No  Information entered by :: [facility] HospitalMmarkoski, LPN  Past Medical History:  Diagnosis Date  . Anxiety   . GERD (gastroesophageal reflux disease)   . Hyperlipidemia   . Hypertension    Past Surgical History:  Procedure Laterality Date  .  CHOLECYSTECTOMY    . COLONOSCOPY  01/01/2006  . COLONOSCOPY WITH PROPOFOL N/A 08/23/2015   Procedure: COLONOSCOPY WITH PROPOFOL;  Surgeon: Earline MayotteJeffrey W Byrnett, MD;  [facility];  Service: Endoscopy;  Laterality: N/A;   Family History  Problem Relation Age of Onset  . Arrhythmia Mother   . Heart attack Father   . Stroke Father   . Healthy Sister   . Healthy Brother   . Healthy Sister   . Breast cancer Neg Hx    Social History   Socioeconomic History  . Marital status: Widowed    Spouse name: Not on file  . Number of children: 0  . Years of education: College  . Highest education level: Bachelor's degree (e.g., BA, AB, BS)  Occupational History  . Occupation: Retired  Engineer, productionocial Needs  . Financial resource strain: Not hard at all  . Food insecurity    Worry: Never true    Inability: Never true  . Transportation needs    Medical: No    Non-medical: No  Tobacco Use  . Smoking status: Never Smoker  . Smokeless tobacco: Never Used  Substance and Sexual Activity  . Alcohol use: No  . Drug use: No  . Sexual activity: Not on file  Lifestyle  . Physical activity    Days per week: 0 days    Minutes per session: 0 min  . Stress: Not at all  Relationships  . Social connections    Talks on phone: Patient refused    Gets together: Patient refused  Attends religious service: Patient refused    Active member of club or organization: Patient refused    Attends meetings of clubs or organizations: Patient refused    Relationship status: Patient refused  Other Topics Concern  . Not on file  Social History Narrative  . Not on file    Outpatient Encounter Medications as of 02/05/2019  Medication Sig  . atenolol (TENORMIN) 50 MG tablet Take 1 tablet (50 mg total) by mouth daily.  . nortriptyline (PAMELOR) 50 MG capsule Take 1 capsule (50 mg total) by mouth at bedtime.  Marland Kitchen omeprazole (PRILOSEC) 40 MG capsule Take 1 capsule (40 mg total) by mouth 2 (two) times daily.  Marland Kitchen  perphenazine (TRILAFON) 4 MG tablet TAKE 1 TABLET BY MOUTH AT BEDTIME  . Pumpkin Seed-Soy Germ (AZO BLADDER CONTROL/GO-LESS) CAPS Take by mouth daily.   . simvastatin (ZOCOR) 20 MG tablet Take 1 tablet (20 mg total) by mouth daily.  . vitamin E 1000 UNIT capsule Take 900 Units by mouth daily.    No facility-administered encounter medications on file as of 02/05/2019.     Activities of Daily Living In your present state of health, do you have any difficulty performing the following activities: 02/05/2019  Hearing? N  Vision? N  Comment Wears eye glasses.  Difficulty concentrating or making decisions? N  Walking or climbing stairs? N  Dressing or bathing? N  Doing errands, shopping? N  Preparing Food and eating ? N  Using the Toilet? N  In the past six months, have you accidently leaked urine? N  Do you have problems with loss of bowel control? N  Managing your Medications? N  Managing your Finances? N  Housekeeping or managing your Housekeeping? N  Some recent data might be hidden    Patient Care Team: Rubye Beach as PCP - General (Family Medicine) Cottle, Billey Co., MD as Attending Physician (Psychiatry)    Assessment:   This is a routine wellness examination for Jessica Little.  Exercise Activities and Dietary recommendations Current Exercise Habits: Structured exercise class, Type of exercise: Other - see comments(swimming), Time (Minutes): 60, Frequency (Times/Week): 2, Weekly Exercise (Minutes/Week): 120, Intensity: Mild, Exercise limited by: None identified  Goals    . DIET - INCREASE WATER INTAKE     Recommend increasing water intake to 3 glasses a day.        Fall Risk: Fall Risk  02/05/2019 01/28/2018 12/10/2017 11/07/2017 10/05/2016  Falls in the past year? 0 Yes Yes No Yes  Number falls in past yr: - 2 or more 2 or more - 2 or more  Comment - 3-4 times becuase of her dizziness related to dizzy spells - -  Injury with Fall? - No No - No  Follow up - - Falls  prevention discussed - Falls prevention discussed    FALL RISK PREVENTION PERTAINING TO THE HOME:  Any stairs in or around the home? Yes  If so, are there any without handrails? No   Home free of loose throw rugs in walkways, pet beds, electrical cords, etc? Yes  Adequate lighting in your home to reduce risk of falls? Yes   ASSISTIVE DEVICES UTILIZED TO PREVENT FALLS:  Life alert? No  Use of a cane, walker or w/c? No  Grab bars in the bathroom? Yes  Shower chair or bench in shower? No  Elevated toilet seat or a handicapped toilet? No    TIMED UP AND GO:  Was the test performed? No .  Depression Screen PHQ 2/9 Scores 02/05/2019 12/10/2017 11/07/2017 10/05/2016  PHQ - 2 Score 0 0 1 0  PHQ- 9 Score - - - 1     Cognitive Function: Declined today.      6CIT Screen 10/05/2016  What Year? 0 points  What month? 0 points  What time? 0 points  Count back from 20 0 points  Months in reverse 0 points  Repeat phrase 2 points  Total Score 2    Immunization History  Administered Date(s) Administered  . Hepatitis A 08/13/2001  . Pneumococcal Conjugate-13 07/06/2015  . Pneumococcal Polysaccharide-23 10/05/2016  . Td 10/24/2000    Qualifies for Shingles Vaccine? Yes . Due for Shingrix. Education has been provided regarding the importance of this vaccine. Pt has been advised to call insurance company to determine out of pocket expense. Advised may also receive vaccine at local pharmacy or Health Dept. Verbalized acceptance and understanding.  Tdap: Although this vaccine is not a covered service during a Wellness Exam, does the patient still wish to receive this vaccine today?  No .    Flu Vaccine: Due for Flu vaccine. Does the patient want to receive this vaccine today?  No .   Pneumococcal Vaccine: Completed series  Screening Tests Health Maintenance  Topic Date Due  . TETANUS/TDAP  05/28/2026 (Originally 10/25/2010)  . MAMMOGRAM  02/15/2020  . DEXA SCAN  01/02/2022  .  COLONOSCOPY  08/22/2025  . Hepatitis C Screening  Completed  . PNA vac Low Risk Adult  Completed    Cancer Screenings:  Colorectal Screening: Completed 08/23/15. Repeat every 10 years.  Mammogram: Completed 02/14/18. Scheduled for 02/23/19.  Bone Density: Completed 01/02/17. Results reflect OSTEOPENIA. Repeat every 5 years.   Lung Cancer Screening: (Low Dose CT Chest recommended if Age 64-80 years, 30 pack-year currently smoking OR have quit w/in 15years.) does not qualify.   Additional Screening:  Hepatitis C Screening: Up to date  Vision Screening: Recommended annual ophthalmology exams for early detection of glaucoma and other disorders of the eye.  Dental Screening: Recommended annual dental exams for proper oral hygiene  Community Resource Referral:  CRR required this visit?  No       Plan:  I have personally reviewed and addressed the Medicare Annual Wellness questionnaire and have noted the following in the patient's chart:  A. Medical and social history B. Use of alcohol, tobacco or illicit drugs  C. Current medications and supplements D. Functional ability and status E.  Nutritional status F.  Physical activity G. Advance directives H. List of other physicians I.  Hospitalizations, surgeries, and ER visits in previous 12 months J.  Vitals K. Screenings such as hearing and vision if needed, cognitive and depression L. Referrals and appointments   In addition, I have reviewed and discussed with patient certain preventive protocols, quality metrics, and best practice recommendations. A written personalized care plan for preventive services as well as general preventive health recommendations were provided to patient. Nurse Health Advisor  Signed,    Quinta Eimer Brandt, [place]  [phone] Nurse Health Advisor   Nurse Notes: None.

## 2019-02-05 ENCOUNTER — Other Ambulatory Visit: Payer: Self-pay

## 2019-02-05 ENCOUNTER — Ambulatory Visit (INDEPENDENT_AMBULATORY_CARE_PROVIDER_SITE_OTHER): Payer: PPO

## 2019-02-05 DIAGNOSIS — Z0000 Encounter for general adult medical examination without abnormal findings: Secondary | ICD-10-CM | POA: Diagnosis not present

## 2019-02-05 NOTE — Patient Instructions (Addendum)
Jessica Little , Thank you for taking time to come for your Medicare Wellness Visit. I appreciate your ongoing commitment to your health goals. Please review the following plan we discussed and let me know if I can assist you in the future.   Screening recommendations/referrals: Colonoscopy: Up to date, due 07/2025 Mammogram: Up to date, scheduled 02/23/19 Bone Density: Up to date, due 12/2021 Recommended yearly ophthalmology/optometry visit for glaucoma screening and checkup Recommended yearly dental visit for hygiene and checkup  Vaccinations: Influenza vaccine: Not required. Pneumococcal vaccine: Completed series Tdap vaccine: Pt declines today.  Shingles vaccine: Pt declines today.     Advanced directives: Please bring a copy of your POA (Power of Attorney) and/or Living Will to your next appointment.   Conditions/risks identified: Continue to increase water intake to 6-8 8 oz glasses a day.   Next appointment: 02/25/19 @ 10:00 AM with Fenton Malling. Declined scheduling an AWV for 2021 at this time.    Preventive Care 71 Years and Older, Female Preventive care refers to lifestyle choices and visits with your health care provider that can promote health and wellness. What does preventive care include?  A yearly physical exam. This is also called an annual well check.  Dental exams once or twice a year.  Routine eye exams. Ask your health care provider how often you should have your eyes checked.  Personal lifestyle choices, including:  Daily care of your teeth and gums.  Regular physical activity.  Eating a healthy diet.  Avoiding tobacco and drug use.  Limiting alcohol use.  Practicing safe sex.  Taking low-dose aspirin every day.  Taking vitamin and mineral supplements as recommended by your health care provider. What happens during an annual well check? The services and screenings done by your health care provider during your annual well check will depend on your  age, overall health, lifestyle risk factors, and family history of disease. Counseling  Your health care provider may ask you questions about your:  Alcohol use.  Tobacco use.  Drug use.  Emotional well-being.  Home and relationship well-being.  Sexual activity.  Eating habits.  History of falls.  Memory and ability to understand (cognition).  Work and work Statistician.  Reproductive health. Screening  You may have the following tests or measurements:  Height, weight, and BMI.  Blood pressure.  Lipid and cholesterol levels. These may be checked every 5 years, or more frequently if you are over 64 years old.  Skin check.  Lung cancer screening. You may have this screening every year starting at age 71 if you have a 30-pack-year history of smoking and currently smoke or have quit within the past 15 years.  Fecal occult blood test (FOBT) of the stool. You may have this test every year starting at age 71.  Flexible sigmoidoscopy or colonoscopy. You may have a sigmoidoscopy every 5 years or a colonoscopy every 10 years starting at age 71.  Hepatitis C blood test.  Hepatitis B blood test.  Sexually transmitted disease (STD) testing.  Diabetes screening. This is done by checking your blood sugar (glucose) after you have not eaten for a while (fasting). You may have this done every 1-3 years.  Bone density scan. This is done to screen for osteoporosis. You may have this done starting at age 71.  Mammogram. This may be done every 1-2 years. Talk to your health care provider about how often you should have regular mammograms. Talk with your health care provider about your test results,  treatment options, and if necessary, the need for more tests. Vaccines  Your health care provider may recommend certain vaccines, such as:  Influenza vaccine. This is recommended every year.  Tetanus, diphtheria, and acellular pertussis (Tdap, Td) vaccine. You may need a Td booster  every 10 years.  Zoster vaccine. You may need this after age 71.  Pneumococcal 13-valent conjugate (PCV13) vaccine. One dose is recommended after age 71.  Pneumococcal polysaccharide (PPSV23) vaccine. One dose is recommended after age 71. Talk to your health care provider about which screenings and vaccines you need and how often you need them. This information is not intended to replace advice given to you by your health care provider. Make sure you discuss any questions you have with your health care provider. Document Released: 06/10/2015 Document Revised: 02/01/2016 Document Reviewed: 03/15/2015 Elsevier Interactive Patient Education  2017 [place] Prevention in the Home Falls can cause injuries. They can happen to people of all ages. There are many things you can do to make your home safe and to help prevent falls. What can I do on the outside of my home?  Regularly fix the edges of walkways and driveways and fix any cracks.  Remove anything that might make you trip as you walk through a door, such as a raised step or threshold.  Trim any bushes or trees on the path to your home.  Use bright outdoor lighting.  Clear any walking paths of anything that might make someone trip, such as rocks or tools.  Regularly check to see if handrails are loose or broken. Make sure that both sides of any steps have handrails.  Any raised decks and porches should have guardrails on the edges.  Have any leaves, snow, or ice cleared regularly.  Use sand or salt on walking paths during winter.  Clean up any spills in your garage right away. This includes oil or grease spills. What can I do in the bathroom?  Use night lights.  Install grab bars by the toilet and in the tub and shower. Do not use towel bars as grab bars.  Use non-skid mats or decals in the tub or shower.  If you need to sit down in the shower, use a plastic, non-slip stool.  Keep the floor dry. Clean up any  water that spills on the floor as soon as it happens.  Remove soap buildup in the tub or shower regularly.  Attach bath mats securely with double-sided non-slip rug tape.  Do not have throw rugs and other things on the floor that can make you trip. What can I do in the bedroom?  Use night lights.  Make sure that you have a light by your bed that is easy to reach.  Do not use any sheets or blankets that are too big for your bed. They should not hang down onto the floor.  Have a firm chair that has side arms. You can use this for support while you get dressed.  Do not have throw rugs and other things on the floor that can make you trip. What can I do in the kitchen?  Clean up any spills right away.  Avoid walking on wet floors.  Keep items that you use a lot in easy-to-reach places.  If you need to reach something above you, use a strong step stool that has a grab bar.  Keep electrical cords out of the way.  Do not use floor polish or wax that makes floors  slippery. If you must use wax, use non-skid floor wax.  Do not have throw rugs and other things on the floor that can make you trip. What can I do with my stairs?  Do not leave any items on the stairs.  Make sure that there are handrails on both sides of the stairs and use them. Fix handrails that are broken or loose. Make sure that handrails are as long as the stairways.  Check any carpeting to make sure that it is firmly attached to the stairs. Fix any carpet that is loose or worn.  Avoid having throw rugs at the top or bottom of the stairs. If you do have throw rugs, attach them to the floor with carpet tape.  Make sure that you have a light switch at the top of the stairs and the bottom of the stairs. If you do not have them, ask someone to add them for you. What else can I do to help prevent falls?  Wear shoes that:  Do not have high heels.  Have rubber bottoms.  Are comfortable and fit you well.  Are closed  at the toe. Do not wear sandals.  If you use a stepladder:  Make sure that it is fully opened. Do not climb a closed stepladder.  Make sure that both sides of the stepladder are locked into place.  Ask someone to hold it for you, if possible.  Clearly mark and make sure that you can see:  Any grab bars or handrails.  First and last steps.  Where the edge of each step is.  Use tools that help you move around (mobility aids) if they are needed. These include:  Canes.  Walkers.  Scooters.  Crutches.  Turn on the lights when you go into a dark area. Replace any light bulbs as soon as they burn out.  Set up your furniture so you have a clear path. Avoid moving your furniture around.  If any of your floors are uneven, fix them.  If there are any pets around you, be aware of where they are.  Review your medicines with your doctor. Some medicines can make you feel dizzy. This can increase your chance of falling. Ask your doctor what other things that you can do to help prevent falls. This information is not intended to replace advice given to you by your health care provider. Make sure you discuss any questions you have with your health care provider. Document Released: 03/10/2009 Document Revised: 10/20/2015 Document Reviewed: 06/18/2014 Elsevier Interactive Patient Education  2017 [employer].

## 2019-02-23 ENCOUNTER — Ambulatory Visit
Admission: RE | Admit: 2019-02-23 | Discharge: 2019-02-23 | Disposition: A | Discharge status: Home / Self Care | Payer: PPO | Source: Ambulatory Visit | Attending: Physician Assistant | Admitting: Physician Assistant

## 2019-02-23 DIAGNOSIS — Z1231 Encounter for screening mammogram for malignant neoplasm of breast: Secondary | ICD-10-CM | POA: Diagnosis not present

## 2019-02-23 IMAGING — MG MM DIGITAL SCREENING BILAT W/ TOMO W/ CAD
8 series · 8 of 24 positions shown · non-contrast
Comparison: Previous exam(s).

CLINICAL DATA: Screening.

EXAM:
DIGITAL SCREENING BILATERAL MAMMOGRAM WITH TOMO AND CAD

[L CC synth-2D]
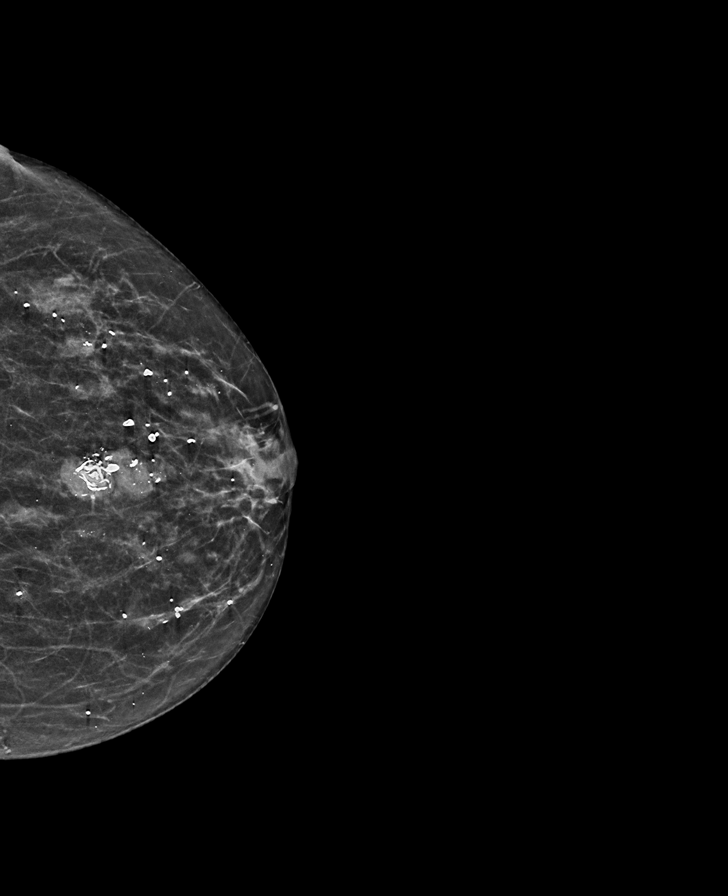

[R CC synth-2D]
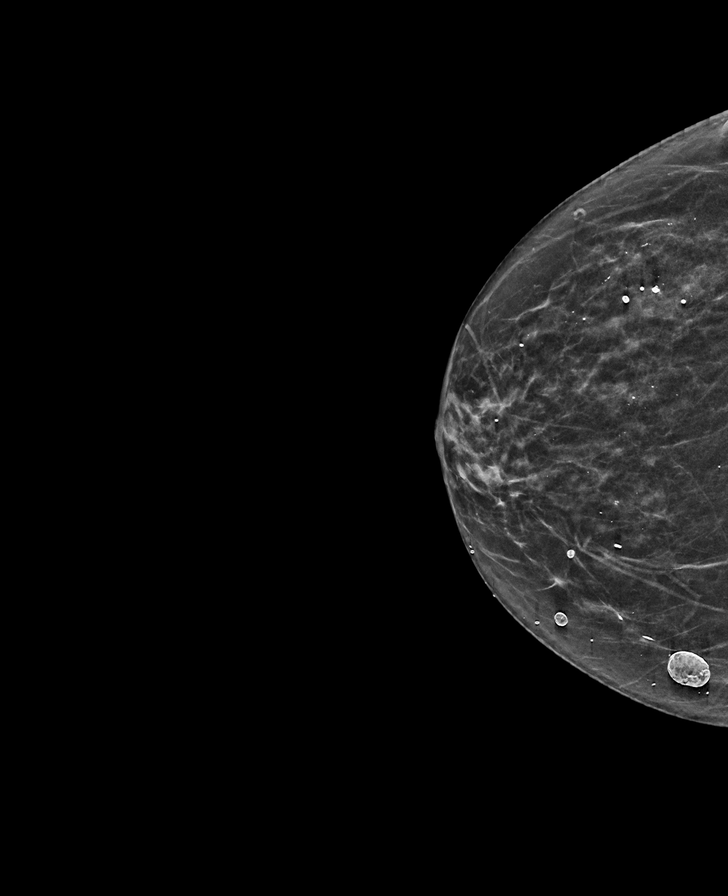

[R MLO synth-2D]
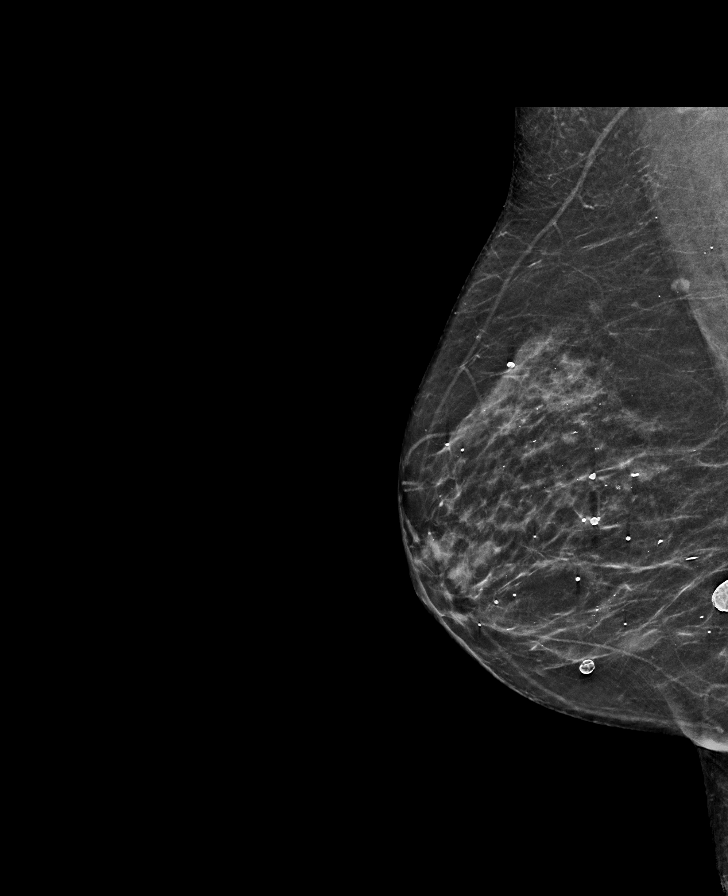

[L MLO synth-2D]
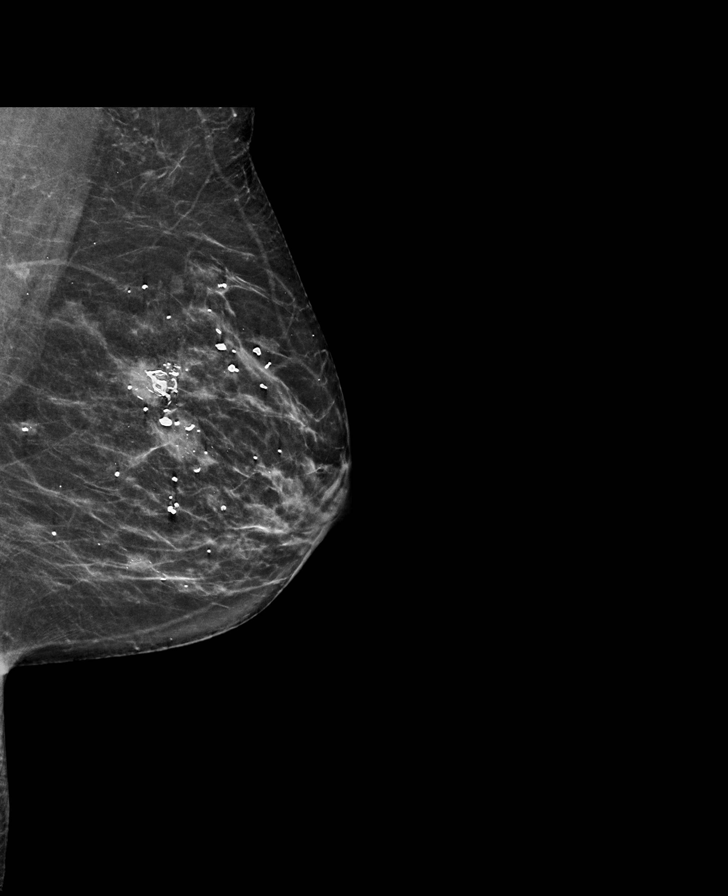

[R CC tomo · tomo slice 26/51.0]
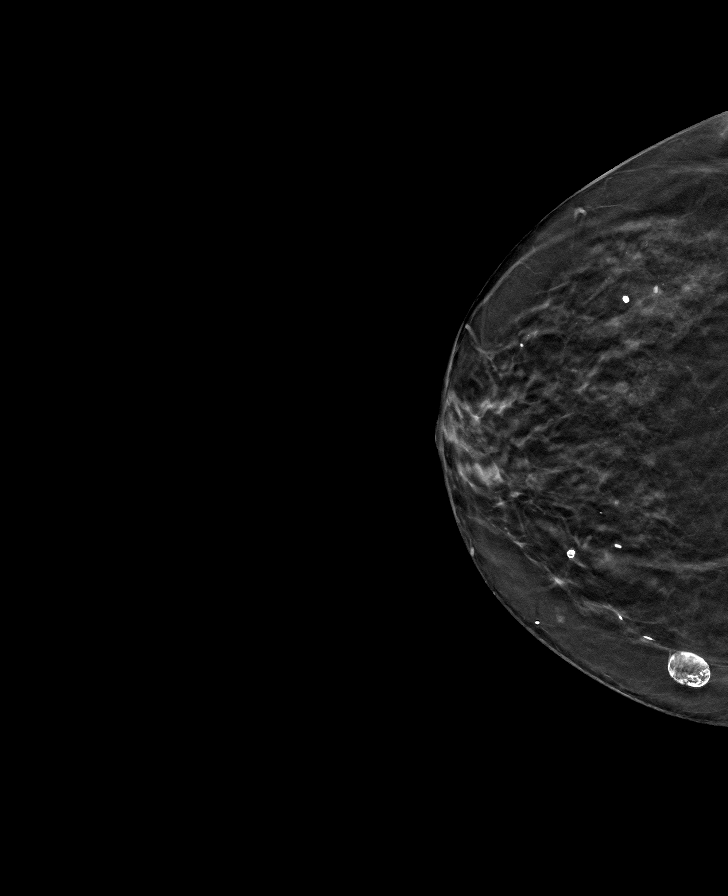

[L MLO tomo · tomo slice 31/60.0]
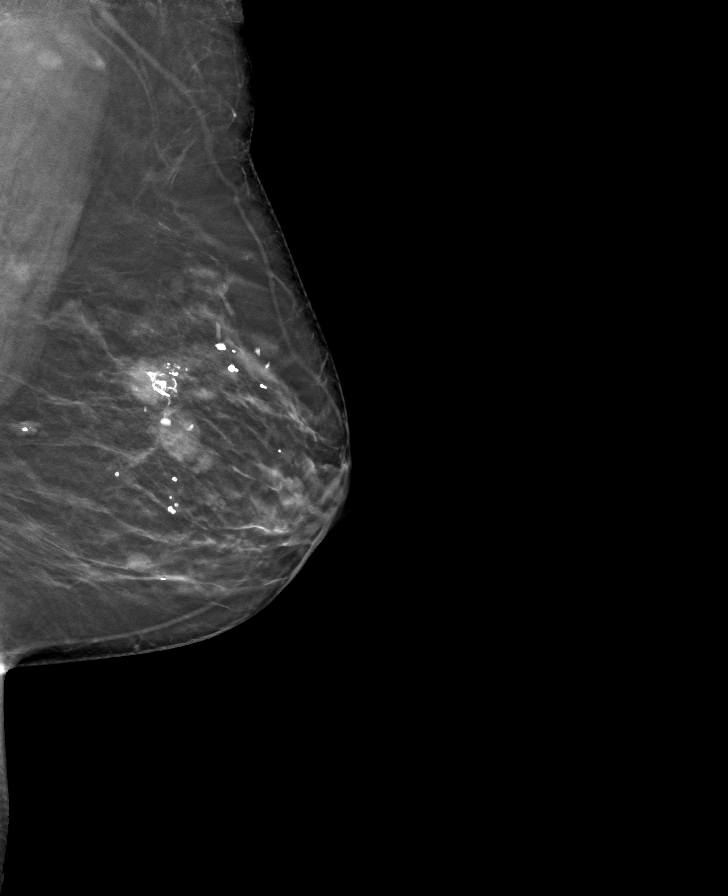

[L CC tomo · tomo slice 27/53.0]
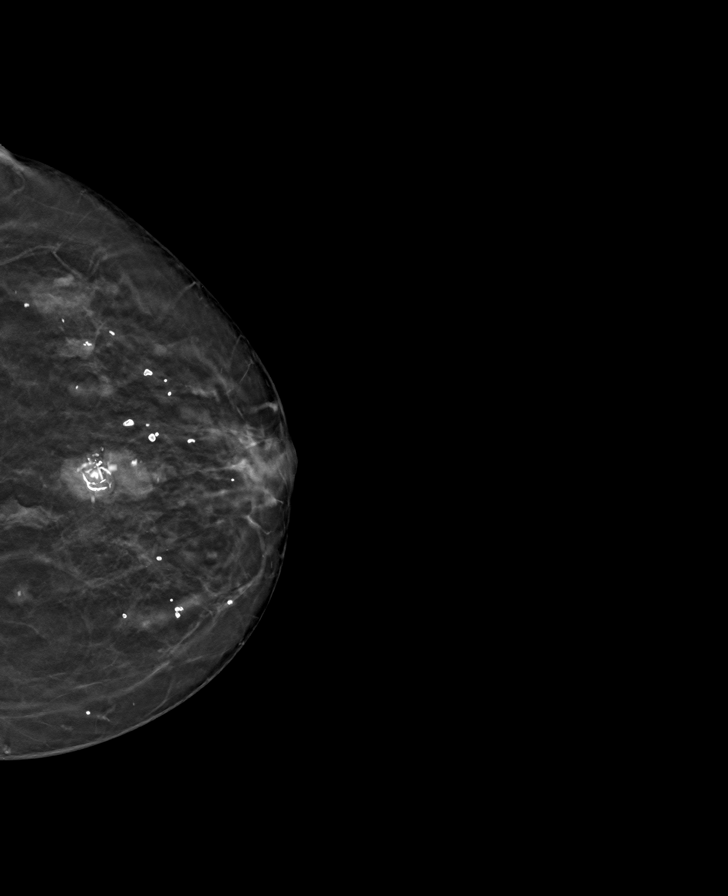

[R MLO tomo · tomo slice 31/61.0]
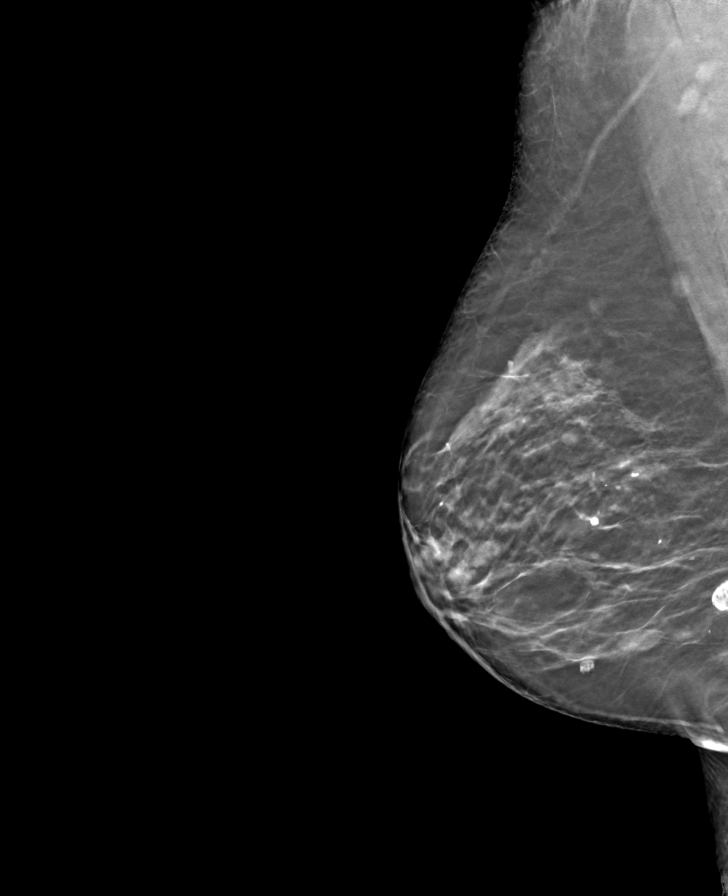

[8 of 24 positions shown; findings below may reference images not displayed]

ACR Breast Density Category c: The breast tissue is heterogeneously
dense, which may obscure small masses.
FINDINGS: There are no findings suspicious for malignancy. Images were
processed with CAD.
IMPRESSION: No mammographic evidence of malignancy. A result letter of this
screening mammogram will be mailed directly to the patient.

RECOMMENDATION:
Screening mammogram in one year. (Code:[5V])

BI-RADS CATEGORY  1: Negative.

## 2019-02-24 NOTE — Progress Notes (Signed)
Patient: Jessica Little, Female    DOB: 1945-07-17, 73 y.o.   MRN: [id number] Visit Date: 02/25/2019  Today's Provider: Mar Daring, PA-C   Chief Complaint  Patient presents with  . Annual Exam   Subjective:    I,Jessica Little,RMA am acting as a Education administrator for [employer], PA-C.  Patient had her AWV 02/05/2019 with [facility].   Jessica Little is a 73 y.o. female. She feels well. She reports exercising, swimming.. She reports she is sleeping well. -----------------------------------------------------------   Review of Systems  Constitutional: Negative.   HENT: Negative.   Eyes: Negative.   Respiratory: Negative.   Cardiovascular: Negative.   Gastrointestinal: Negative.   Endocrine: Negative.   Genitourinary: Negative.   Musculoskeletal: Negative.   Skin: Negative.   Allergic/Immunologic: Negative.   Neurological: Negative.   Hematological: Negative.   Psychiatric/Behavioral: Negative.     Social History   Socioeconomic History  . Marital status: Widowed    Spouse name: Not on file  . Number of children: 0  . Years of education: College  . Highest education level: Bachelor's degree (e.g., BA, AB, BS)  Occupational History  . Occupation: Retired  Scientific laboratory technician  . Financial resource strain: Not hard at all  . Food insecurity    Worry: Never true    Inability: Never true  . Transportation needs    Medical: No    Non-medical: No  Tobacco Use  . Smoking status: Never Smoker  . Smokeless tobacco: Never Used  Substance and Sexual Activity  . Alcohol use: No  . Drug use: No  . Sexual activity: Not on file  Lifestyle  . Physical activity    Days per week: 0 days    Minutes per session: 0 min  . Stress: Not at all  Relationships  . Social Herbalist on phone: Patient refused    Gets together: Patient refused    Attends religious service: Patient refused    Active member of club or organization: Patient  refused    Attends meetings of clubs or organizations: Patient refused    Relationship status: Patient refused  . Intimate partner violence    Fear of current or ex partner: Patient refused    Emotionally abused: Patient refused    Physically abused: Patient refused    Forced sexual activity: Patient refused  Other Topics Concern  . Not on file  Social History Narrative  . Not on file    Past Medical History:  Diagnosis Date  . Anxiety   . GERD (gastroesophageal reflux disease)   . Hyperlipidemia   . Hypertension      Patient Active Problem List   Diagnosis Date Noted  . GAD (generalized anxiety disorder) 05/25/2018  . Osteopenia 10/10/2016  . H/O urinary frequency 07/06/2015  . Anxiety 02/03/2015  . Clinical depression 02/03/2015  . Acid reflux 02/03/2015  . HLD (hyperlipidemia) 02/03/2015  . BP (high blood pressure) 02/03/2015  . Cannot sleep 02/03/2015  . Dislocated shoulder 02/03/2015  . Thyroid nodule 02/03/2015    Past Surgical History:  Procedure Laterality Date  . CHOLECYSTECTOMY    . COLONOSCOPY  01/01/2006  . COLONOSCOPY WITH PROPOFOL N/A 08/23/2015   Procedure: COLONOSCOPY WITH PROPOFOL;  Surgeon: Robert Bellow, MD;  Location: [facility] ENDOSCOPY;  Service: Endoscopy;  Laterality: N/A;    Her family history includes Arrhythmia in her mother; Healthy in her brother, sister, and sister; Heart attack in  her father; Stroke in her father. There is no history of Breast cancer.   Current Outpatient Medications:  .  atenolol (TENORMIN) 50 MG tablet, Take 1 tablet (50 mg total) by mouth daily., Disp: 90 tablet, Rfl: 3 .  nortriptyline (PAMELOR) 50 MG capsule, Take 1 capsule (50 mg total) by mouth at bedtime., Disp: 90 capsule, Rfl: 3 .  omeprazole (PRILOSEC) 40 MG capsule, Take 1 capsule (40 mg total) by mouth 2 (two) times daily., Disp: 180 capsule, Rfl: 1 .  perphenazine (TRILAFON) 4 MG tablet, TAKE 1 TABLET BY MOUTH AT BEDTIME, Disp: 90 tablet, Rfl: 3 .  Pumpkin  Seed-Soy Germ (AZO BLADDER CONTROL/GO-LESS) CAPS, Take by mouth daily. , Disp: , Rfl:  .  simvastatin (ZOCOR) 20 MG tablet, Take 1 tablet (20 mg total) by mouth daily., Disp: 90 tablet, Rfl: 3 .  vitamin E 1000 UNIT capsule, Take 900 Units by mouth daily. , Disp: , Rfl:   Patient Care Team: Margaretann Loveless, PA-C as PCP - General (Family Medicine) Cottle, Steva Ready., MD as Attending Physician (Psychiatry)     Objective:    Vitals: BP (!) 150/88 (BP Location: Left Arm, Patient Position: Sitting, Cuff Size: Large)   Pulse (!) 57   Temp (!) 97.5 F (36.4 C) (Other (Comment)) Comment (Src): forehead  Resp 16   Ht  (1.676 m)   Wt 161 lb 3.2 oz (73.1 kg)   BMI 26.02 kg/m   Physical Exam Vitals signs reviewed.  Constitutional:      General: She is not in acute distress.    Appearance: Normal appearance. She is well-developed. She is not ill-appearing or diaphoretic.  HENT:     Head: Normocephalic and atraumatic.     Right Ear: Tympanic membrane, ear canal and external ear normal.     Left Ear: Tympanic membrane, ear canal and external ear normal.     Nose: Nose normal.     Mouth/Throat:     Mouth: Mucous membranes are moist.     Pharynx: Oropharynx is clear. No oropharyngeal exudate.  Eyes:     General: No scleral icterus.       Right eye: No discharge.        Left eye: No discharge.     Extraocular Movements: Extraocular movements intact.     Conjunctiva/sclera: Conjunctivae normal.     Pupils: Pupils are equal, round, and reactive to light.  Neck:     Musculoskeletal: Normal range of motion and neck supple.     Thyroid: No thyromegaly.     Vascular: No carotid bruit or JVD.     Trachea: No tracheal deviation.  Cardiovascular:     Rate and Rhythm: Normal rate and regular rhythm.     Pulses: Normal pulses.     Heart sounds: Normal heart sounds. No murmur. No friction rub. No gallop.   Pulmonary:     Effort: Pulmonary effort is normal. No respiratory distress.      Breath sounds: Normal breath sounds. No wheezing or rales.  Chest:     Chest wall: No tenderness.  Abdominal:     General: Abdomen is flat. Bowel sounds are normal. There is no distension.     Palpations: Abdomen is soft. There is no mass.     Tenderness: There is no abdominal tenderness. There is no guarding or rebound.  Musculoskeletal: Normal range of motion.        General: No tenderness.     Right lower leg:  No edema.     Left lower leg: No edema.  Lymphadenopathy:     Cervical: No cervical adenopathy.  Skin:    General: Skin is warm and dry.     Capillary Refill: Capillary refill takes less than 2 seconds.     Findings: No rash.  Neurological:     General: No focal deficit present.     Mental Status: She is alert and oriented to person, place, and time. Mental status is at baseline.  Psychiatric:        Mood and Affect: Mood normal.        Behavior: Behavior normal.        Thought Content: Thought content normal.        Judgment: Judgment normal.     Activities of Daily Living In your present state of health, do you have any difficulty performing the following activities: 02/05/2019  Hearing? N  Vision? N  Comment Wears eye glasses.  Difficulty concentrating or making decisions? N  Walking or climbing stairs? N  Dressing or bathing? N  Doing errands, shopping? N  Preparing Food and eating ? N  Using the Toilet? N  In the past six months, have you accidently leaked urine? N  Do you have problems with loss of bowel control? N  Managing your Medications? N  Managing your Finances? N  Housekeeping or managing your Housekeeping? N  Some recent data might be hidden    Fall Risk Assessment Fall Risk  02/25/2019 02/05/2019 01/28/2018 12/10/2017 11/07/2017  Falls in the past year? 0 0 Yes Yes No  Number falls in past yr: 0 - 2 or more 2 or more -  Comment - - 3-4 times becuase of her dizziness related to dizzy spells -  Injury with Fall? 0 - No No -  Follow up - - - Falls  prevention discussed -     Depression Screen PHQ 2/9 Scores 02/25/2019 02/05/2019 12/10/2017 11/07/2017  PHQ - 2 Score 0 0 0 1  PHQ- 9 Score 0 - - -   Cognitive Function: Declined 02/05/2019 6CIT Screen 10/05/2016  What Year? 0 points  What month? 0 points  What time? 0 points  Count back from 20 0 points  Months in reverse 0 points  Repeat phrase 2 points  Total Score 2       Assessment & Plan:    Annual Physical Reviewed patient's Family Medical History Reviewed and updated list of patient's medical providers Assessment of cognitive impairment was done Assessed patient's functional ability Established a written schedule for health screening services Health Risk Assessent Completed and Reviewed  Exercise Activities and Dietary recommendations Goals    . DIET - INCREASE WATER INTAKE     Recommend increasing water intake to 3 glasses a day.        Immunization History  Administered Date(s) Administered  . Hepatitis A 08/13/2001  . Pneumococcal Conjugate-13 07/06/2015  . Pneumococcal Polysaccharide-23 10/05/2016  . Td 10/24/2000    Health Maintenance  Topic Date Due  . TETANUS/TDAP  05/28/2026 (Originally 10/25/2010)  . MAMMOGRAM  02/22/2021  . DEXA SCAN  01/02/2022  . COLONOSCOPY  08/22/2025  . Hepatitis C Screening  Completed  . PNA vac Low Risk Adult  Completed     Discussed health benefits of physical activity, and encouraged her to engage in regular exercise appropriate for her age and condition.    1. Annual physical exam Normal physical exam today. Will check labs as below and f/u  pending lab results. If labs are stable and WNL she will not need to have these rechecked for one year at her next annual physical exam. She is to call the office in the meantime if she has any acute issue, questions or concerns.  2. Essential hypertension Slightly elevated today but patient reports home readings are in the 130s/80s. Continue Atenolol 50mg  daily. Will check  labs as below and f/u pending results. - CBC with Differential/Platelet - Comprehensive metabolic panel - Hemoglobin A1c - Lipid panel  3. Hypokalemia H/O this. Will check labs as below and f/u pending results. - CBC with Differential/Platelet - Comprehensive metabolic panel  4. Pure hypercholesterolemia Stable. Continue Simvastatin 20mg . Will check labs as below and f/u pending results. - CBC with Differential/Platelet - Comprehensive metabolic panel - Hemoglobin A1c - Lipid panel  5. Thyroid nodule No symptoms. Will check labs as below and f/u pending results. - CBC with Differential/Platelet - TSH  6. Elevated hemoglobin A1c Was 5.7 last year. Lifestyle management. Will check labs as below and f/u pending results. - Comprehensive metabolic panel - Hemoglobin A1c - Lipid panel  7. Schizoaffective disorder, depressive type (HCC) Stable. Followed by Psychiatry.   ------------------------------------------------------------------------------------------------------------    Jennifer M BurneMargaretann Lovelesstte, PA-C  [facility]

## 2019-02-25 ENCOUNTER — Encounter: Payer: Self-pay | Admitting: Physician Assistant

## 2019-02-25 ENCOUNTER — Ambulatory Visit (INDEPENDENT_AMBULATORY_CARE_PROVIDER_SITE_OTHER): Payer: PPO | Admitting: Physician Assistant

## 2019-02-25 ENCOUNTER — Other Ambulatory Visit: Payer: Self-pay

## 2019-02-25 VITALS — BP 150/88 | HR 57 | Temp 97.5°F | Resp 16 | Ht 66.0 in | Wt 161.2 lb

## 2019-02-25 DIAGNOSIS — E876 Hypokalemia: Secondary | ICD-10-CM | POA: Diagnosis not present

## 2019-02-25 DIAGNOSIS — Z0000 Encounter for general adult medical examination without abnormal findings: Secondary | ICD-10-CM

## 2019-02-25 DIAGNOSIS — E7800 Pure hypercholesterolemia, unspecified: Secondary | ICD-10-CM

## 2019-02-25 DIAGNOSIS — I10 Essential (primary) hypertension: Secondary | ICD-10-CM | POA: Diagnosis not present

## 2019-02-25 DIAGNOSIS — R7309 Other abnormal glucose: Secondary | ICD-10-CM | POA: Diagnosis not present

## 2019-02-25 DIAGNOSIS — E041 Nontoxic single thyroid nodule: Secondary | ICD-10-CM

## 2019-02-25 DIAGNOSIS — F251 Schizoaffective disorder, depressive type: Secondary | ICD-10-CM

## 2019-02-25 NOTE — Patient Instructions (Signed)
Health Maintenance After Age 71 After age 71, you are at a higher risk for certain long-term diseases and infections as well as injuries from falls. Falls are a major cause of broken bones and head injuries in people who are older than age 71. Getting regular preventive care can help to keep you healthy and well. Preventive care includes getting regular testing and making lifestyle changes as recommended by your health care provider. Talk with your health care provider about:  Which screenings and tests you should have. A screening is a test that checks for a disease when you have no symptoms.  A diet and exercise plan that is right for you. What should I know about screenings and tests to prevent falls? Screening and testing are the best ways to find a health problem early. Early diagnosis and treatment give you the best chance of managing medical conditions that are common after age 71. Certain conditions and lifestyle choices may make you more likely to have a fall. Your health care provider may recommend:  Regular vision checks. Poor vision and conditions such as cataracts can make you more likely to have a fall. If you wear glasses, make sure to get your prescription updated if your vision changes.  Medicine review. Work with your health care provider to regularly review all of the medicines you are taking, including over-the-counter medicines. Ask your health care provider about any side effects that may make you more likely to have a fall. Tell your health care provider if any medicines that you take make you feel dizzy or sleepy.  Osteoporosis screening. Osteoporosis is a condition that causes the bones to get weaker. This can make the bones weak and cause them to break more easily.  Blood pressure screening. Blood pressure changes and medicines to control blood pressure can make you feel dizzy.  Strength and balance checks. Your health care provider may recommend certain tests to check your  strength and balance while standing, walking, or changing positions.  Foot health exam. Foot pain and numbness, as well as not wearing proper footwear, can make you more likely to have a fall.  Depression screening. You may be more likely to have a fall if you have a fear of falling, feel emotionally low, or feel unable to do activities that you used to do.  Alcohol use screening. Using too much alcohol can affect your balance and may make you more likely to have a fall. What actions can I take to lower my risk of falls? General instructions  Talk with your health care provider about your risks for falling. Tell your health care provider if: ? You fall. Be sure to tell your health care provider about all falls, even ones that seem minor. ? You feel dizzy, sleepy, or off-balance.  Take over-the-counter and prescription medicines only as told by your health care provider. These include any supplements.  Eat a healthy diet and maintain a healthy weight. A healthy diet includes low-fat dairy products, low-fat (lean) meats, and fiber from whole grains, beans, and lots of fruits and vegetables. Home safety  Remove any tripping hazards, such as rugs, cords, and clutter.  Install safety equipment such as grab bars in bathrooms and safety rails on stairs.  Keep rooms and walkways well-lit. Activity   Follow a regular exercise program to stay fit. This will help you maintain your balance. Ask your health care provider what types of exercise are appropriate for you.  If you need a cane or   walker, use it as recommended by your health care provider.  Wear supportive shoes that have nonskid soles. Lifestyle  Do not drink alcohol if your health care provider tells you not to drink.  If you drink alcohol, limit how much you have: ? 0-1 drink a day for women. ? 0-2 drinks a day for men.  Be aware of how much alcohol is in your drink. In the [place] one drink equals one typical bottle of beer (12  oz), one-half glass of wine (5 oz), or one shot of hard liquor (1 oz).  Do not use any products that contain nicotine or tobacco, such as cigarettes and e-cigarettes. If you need help quitting, ask your health care provider. Summary  Having a healthy lifestyle and getting preventive care can help to protect your health and wellness after age 71.  Screening and testing are the best way to find a health problem early and help you avoid having a fall. Early diagnosis and treatment give you the best chance for managing medical conditions that are more common for people who are older than age 71.  Falls are a major cause of broken bones and head injuries in people who are older than age 71. Take precautions to prevent a fall at home.  Work with your health care provider to learn what changes you can make to improve your health and wellness and to prevent falls. This information is not intended to replace advice given to you by your health care provider. Make sure you discuss any questions you have with your health care provider. Document Released: 03/27/2017 Document Revised: 09/04/2018 Document Reviewed: 03/27/2017 Elsevier Patient Education  2020 Elsevier Inc.  

## 2019-02-26 ENCOUNTER — Telehealth: Payer: Self-pay | Admitting: *Deleted

## 2019-02-26 DIAGNOSIS — E7800 Pure hypercholesterolemia, unspecified: Secondary | ICD-10-CM

## 2019-02-26 LAB — CBC WITH DIFFERENTIAL/PLATELET
Basophils Absolute: 0.1 10*3/uL (ref 0.0–0.2)
Basos: 1 %
EOS (ABSOLUTE): 0.1 10*3/uL (ref 0.0–0.4)
Eos: 2 %
Hematocrit: 39.7 % (ref 34.0–46.6)
Hemoglobin: 12.8 g/dL (ref 11.1–15.9)
Immature Grans (Abs): 0 10*3/uL (ref 0.0–0.1)
Immature Granulocytes: 1 %
Lymphocytes Absolute: 2.8 10*3/uL (ref 0.7–3.1)
Lymphs: 44 %
MCH: 26.3 pg — ABNORMAL LOW (ref 26.6–33.0)
MCHC: 32.2 g/dL (ref 31.5–35.7)
MCV: 82 fL (ref 79–97)
Monocytes Absolute: 0.6 10*3/uL (ref 0.1–0.9)
Monocytes: 10 %
Neutrophils Absolute: 2.6 10*3/uL (ref 1.4–7.0)
Neutrophils: 42 %
Platelets: 245 10*3/uL (ref 150–450)
RBC: 4.86 x10E6/uL (ref 3.77–5.28)
RDW: 15 % (ref 11.7–15.4)
WBC: 6.2 10*3/uL (ref 3.4–10.8)

## 2019-02-26 LAB — COMPREHENSIVE METABOLIC PANEL
ALT: 10 IU/L (ref 0–32)
AST: 15 IU/L (ref 0–40)
Albumin/Globulin Ratio: 1.9 (ref 1.2–2.2)
Albumin: 4.3 g/dL (ref 3.7–4.7)
Alkaline Phosphatase: 79 IU/L (ref 39–117)
BUN/Creatinine Ratio: 15 (ref 12–28)
BUN: 20 mg/dL (ref 8–27)
Bilirubin Total: 0.5 mg/dL (ref 0.0–1.2)
CO2: 25 mmol/L (ref 20–29)
Calcium: 9.1 mg/dL (ref 8.7–10.3)
Chloride: 103 mmol/L (ref 96–106)
Creatinine, Ser: 1.33 mg/dL — ABNORMAL HIGH (ref 0.57–1.00)
GFR calc Af Amer: 46 mL/min/{1.73_m2} — ABNORMAL LOW (ref >59)
GFR calc non Af Amer: 40 mL/min/{1.73_m2} — ABNORMAL LOW (ref >59)
Globulin, Total: 2.3 g/dL (ref 1.5–4.5)
Glucose: 101 mg/dL — ABNORMAL HIGH (ref 65–99)
Potassium: 3.3 mmol/L — ABNORMAL LOW (ref 3.5–5.2)
Sodium: 143 mmol/L (ref 134–144)
Total Protein: 6.6 g/dL (ref 6.0–8.5)

## 2019-02-26 LAB — HEMOGLOBIN A1C
Est. average glucose Bld gHb Est-mCnc: 111 mg/dL
Hgb A1c MFr Bld: 5.5 % (ref 4.8–5.6)

## 2019-02-26 LAB — LIPID PANEL
Chol/HDL Ratio: 3.7 ratio (ref 0.0–4.4)
Cholesterol, Total: 217 mg/dL — ABNORMAL HIGH (ref 100–199)
HDL: 58 mg/dL (ref >39)
LDL Chol Calc (NIH): 139 mg/dL — ABNORMAL HIGH (ref 0–99)
Triglycerides: 114 mg/dL (ref 0–149)
VLDL Cholesterol Cal: 20 mg/dL (ref 5–40)

## 2019-02-26 LAB — TSH: TSH: 2.75 u[IU]/mL (ref 0.450–4.500)

## 2019-02-26 MED ORDER — POTASSIUM CHLORIDE CRYS ER 10 MEQ PO TBCR: 10.0000 meq | EXTENDED_RELEASE_TABLET | Freq: Every day | ORAL | 1 refills | Status: DC

## 2019-02-26 NOTE — Telephone Encounter (Signed)
Tried calling pt. No answer and no vm. Will try again later.  

## 2019-02-26 NOTE — Addendum Note (Signed)
Addended by: Mar Daring on: 02/26/2019 12:58 PM   Modules accepted: Orders

## 2019-02-26 NOTE — Telephone Encounter (Signed)
-----   Message from Mar Daring, [place] sent at 02/26/2019 12:57 PM EDT ----- Blood count is stable. Kidney function is stable. Continue to stay well hydrated. Liver enzymes are normal. Potassium is just borderline low still. May benefit by taking a daily potassium supplement. I will send in. Sodium and calcium are normal. Sugar has improved and is in normal range again. Cholesterol is up compared to last year. Are you taking your simvastatin 20mg  daily? If so we may need to increase to help protect you. Thyroid is normal.

## 2019-03-02 MED ORDER — SIMVASTATIN 40 MG PO TABS: 40.0000 mg | ORAL_TABLET | Freq: Every day | ORAL | 3 refills | Status: DC

## 2019-03-02 NOTE — Telephone Encounter (Signed)
Sim[facility]atin 40mg  [facility]

## 2019-03-02 NOTE — Telephone Encounter (Signed)
Patient advised as directed below. Patient reports that she is taking the Simvastatin 20 mg daily. She wants the prescription to go to [facility].

## 2019-05-12 ENCOUNTER — Other Ambulatory Visit: Payer: Self-pay | Admitting: Physician Assistant

## 2019-05-12 DIAGNOSIS — K2100 Gastro-esophageal reflux disease with esophagitis, without bleeding: Secondary | ICD-10-CM

## 2019-05-12 NOTE — Telephone Encounter (Signed)
Please review for refill of omeprazole 40 mg delayed release cap. Last refill was 05/16/2018 for 180 caps, to take 2 times a day, with one refill. Prescription expires on 05/16/2019.

## 2019-05-25 ENCOUNTER — Other Ambulatory Visit: Payer: Self-pay

## 2019-05-25 ENCOUNTER — Telehealth: Payer: Self-pay | Admitting: Psychiatry

## 2019-05-25 DIAGNOSIS — F251 Schizoaffective disorder, depressive type: Secondary | ICD-10-CM

## 2019-05-25 MED ORDER — PERPHENAZINE 4 MG PO TABS: ORAL_TABLET | ORAL | 0 refills | Status: DC

## 2019-05-25 MED ORDER — NORTRIPTYLINE HCL 50 MG PO CAPS: 50.0000 mg | ORAL_CAPSULE | Freq: Every day | ORAL | 0 refills | Status: DC

## 2019-05-25 NOTE — Telephone Encounter (Signed)
PT has not had Pamelor and Trilafon since the weekend. She ordered from mail order and it is delayed and not come yet. Please call in a 2 week supply of both Pamelor and Trilafon to the [facility] on file in [place].

## 2019-05-25 NOTE — Telephone Encounter (Signed)
2 week supply of both medications submitted to [facility]

## 2019-06-22 ENCOUNTER — Encounter: Payer: Self-pay | Admitting: Psychiatry

## 2019-06-22 ENCOUNTER — Other Ambulatory Visit: Payer: Self-pay

## 2019-06-22 ENCOUNTER — Ambulatory Visit (INDEPENDENT_AMBULATORY_CARE_PROVIDER_SITE_OTHER): Payer: PPO | Admitting: Psychiatry

## 2019-06-22 DIAGNOSIS — F411 Generalized anxiety disorder: Secondary | ICD-10-CM

## 2019-06-22 DIAGNOSIS — F4001 Agoraphobia with panic disorder: Secondary | ICD-10-CM | POA: Diagnosis not present

## 2019-06-22 DIAGNOSIS — F251 Schizoaffective disorder, depressive type: Secondary | ICD-10-CM | POA: Diagnosis not present

## 2019-06-22 DIAGNOSIS — F3342 Major depressive disorder, recurrent, in full remission: Secondary | ICD-10-CM | POA: Diagnosis not present

## 2019-06-22 MED ORDER — PERPHENAZINE 4 MG PO TABS: ORAL_TABLET | ORAL | 3 refills | Status: DC

## 2019-06-22 MED ORDER — NORTRIPTYLINE HCL 50 MG PO CAPS: 50.0000 mg | ORAL_CAPSULE | Freq: Every day | ORAL | 3 refills | Status: DC

## 2019-06-22 NOTE — Progress Notes (Signed)
Jessica Little [id number] [birth year] 74 y.o.  Subjective:   Patient ID:  Jessica Little is a 75 y.o. (DOB [birth year]) female.  Chief Complaint:  Chief Complaint  Patient presents with  . Follow-up    Medication Management  . Depression    Medication Management  . Anxiety    Medication Management    HPI Jessica Little presents to the office today for follow-up of depression with psychotic features.  Hosp April 2019 for 4 weeks after hernia surgery and then had agitated delirium.  Per Dr. Jamse Mead psychiatry at [facility] she also had a prolonged QTC of 593.  She was discharged from the hospital on nortriptyline 50 and perphenazine 4 mg nightly and lorazepam 1 mg twice daily .   then rehab.  A lot of med changes. Came off lorazepam in the hospital but doesn't remember it.  No psychosis off the meds.  Weaned off perphenazine too and on less nortriptyline and doing ok.  Last seen January 2020.  No meds were changed.  Has remained on nortriptyline 50 HS and perphenazine 4 HS. She was doing well at the time.  No Covid.  Had problems getting meds for a couple of weeks at [facility].    Patient reports stable mood and denies depressed or irritable moods.  Patient denies any recent difficulty with anxiety.  Patient reports variable difficulty with sleep initiation or maintenance. No trazodone anymore   Denies appetite disturbance.  Patient reports that energy and motivation have been good.  Patient denies any difficulty with concentration.  Patient denies any suicidal ideation.  Past Psychiatric Medication Trials: Nortriptyline 100, perphenazine 16 mg daily but mostly 8 mg over the last 10 years, lorazepam 1 4 times daily, atenolol, Geodon 160, olanzapine 20, quetiapine 300, Haldol 15 Under the care of [facility] since July 1996  Review of Systems:  Review of Systems  Respiratory: Negative for chest tightness.   Gastrointestinal: Negative for abdominal distention.   Neurological: Positive for tremors. Negative for weakness.  Psychiatric/Behavioral: Negative for agitation, behavioral problems, confusion, decreased concentration, dysphoric mood, hallucinations, self-injury, sleep disturbance and suicidal ideas. The patient is nervous/anxious. The patient is not hyperactive.     Medications: I have reviewed the patient's current medications.  Current Outpatient Medications  Medication Sig Dispense Refill  . Apple Cid Vn-Grn Tea-Bit Or-Cr (APPLE CIDER VINEGAR PLUS PO) Take by mouth.    Marland Kitchen atenolol (TENORMIN) 50 MG tablet Take 1 tablet (50 mg total) by mouth daily. 90 tablet 3  . calcium carbonate (CALCIUM 600) 600 MG TABS tablet Take 600 mg by mouth 2 (two) times daily with a meal.    . nortriptyline (PAMELOR) 50 MG capsule Take 1 capsule (50 mg total) by mouth at bedtime. 90 capsule 3  . omeprazole (PRILOSEC) 40 MG capsule Take 1 capsule by mouth twice daily 180 capsule 0  . perphenazine (TRILAFON) 4 MG tablet TAKE 1 TABLET BY MOUTH AT BEDTIME 90 tablet 3  . potassium chloride (KLOR-CON M10) 10 MEQ tablet Take 1 tablet (10 mEq total) by mouth daily. 90 tablet 1  . Pumpkin Seed-Soy Germ (AZO BLADDER CONTROL/GO-LESS) CAPS Take by mouth daily.     . simvastatin (ZOCOR) 40 MG tablet Take 1 tablet (40 mg total) by mouth at bedtime. 90 tablet 3  . vitamin E 1000 UNIT capsule Take 900 Units by mouth daily.      No current facility-administered medications for this visit.    Medication Side Effects: none  Allergies: No Known Allergies  Past Medical History:  Diagnosis Date  . Anxiety   . GERD (gastroesophageal reflux disease)   . Hyperlipidemia   . Hypertension     Family History  Problem Relation Age of Onset  . Arrhythmia Mother   . Heart attack Father   . Stroke Father   . Healthy Sister   . Healthy Brother   . Healthy Sister   . Breast cancer Neg Hx     Social History   Socioeconomic History  . Marital status: Widowed    Spouse name:  Not on file  . Number of children: 0  . Years of education: College  . Highest education level: Bachelor's degree (e.g., BA, AB, BS)  Occupational History  . Occupation: Retired  Tobacco Use  . Smoking status: Never Smoker  . Smokeless tobacco: Never Used  Substance and Sexual Activity  . Alcohol use: No  . Drug use: No  . Sexual activity: Not on file  Other Topics Concern  . Not on file  Social History Narrative  . Not on file   Social Determinants of Health   Financial Resource Strain:   . Difficulty of Paying Living Expenses: Not on file  Food Insecurity:   . Worried About Programme researcher, broadcasting/film/video in the Last Year: Not on file  . Ran Out of Food in the Last Year: Not on file  Transportation Needs:   . Lack of Transportation (Medical): Not on file  . Lack of Transportation (Non-Medical): Not on file  Physical Activity: Inactive  . Days of Exercise per Week: 0 days  . Minutes of Exercise per Session: 0 min  Stress:   . Feeling of Stress : Not on file  Social Connections: Unknown  . Frequency of Communication with Friends and Family: Patient refused  . Frequency of Social Gatherings with Friends and Family: Patient refused  . Attends Religious Services: Patient refused  . Active Member of Clubs or Organizations: Patient refused  . Attends Banker Meetings: Patient refused  . Marital Status: Patient refused  Intimate Partner Violence: Unknown  . Fear of Current or Ex-Partner: Patient refused  . Emotionally Abused: Patient refused  . Physically Abused: Patient refused  . Sexually Abused: Patient refused    Past Medical History, Surgical history, Social history, and Family history were reviewed and updated as appropriate.   Please see review of systems for further details on the patient's review from today.   Objective:   Physical Exam:  There were no vitals taken for this visit.  Physical Exam Constitutional:      General: She is not in acute  distress.    Appearance: She is well-developed.  Musculoskeletal:        General: No deformity.  Neurological:     Mental Status: She is alert and oriented to person, place, and time.     Cranial Nerves: Dysarthria present.     Motor: No tremor.     Coordination: Coordination normal.     Gait: Gait normal.     Comments: Slight dysarthria chronically.  Psychiatric:        Attention and Perception: She is attentive. She does not perceive auditory hallucinations.        Mood and Affect: Mood is anxious. Mood is not depressed. Affect is not labile, blunt, angry or inappropriate.        Speech: Speech normal.        Behavior: Behavior normal.  Thought Content: Thought content normal. Thought content is not paranoid. Thought content does not include homicidal or suicidal ideation. Thought content does not include homicidal or suicidal plan.        Cognition and Memory: Cognition normal.        Judgment: Judgment normal.     Comments: Insight fair. No auditory or visual hallucinations. No delusions.      Lab Review:     Component Value Date/Time   NA 143 02/25/2019 1037   NA 144 04/30/2014 1321   K 3.3 (L) 02/25/2019 1037   K 3.3 (L) 04/30/2014 1321   CL 103 02/25/2019 1037   CL 102 04/30/2014 1321   CO2 25 02/25/2019 1037   CO2 28 04/30/2014 1321   GLUCOSE 101 (H) 02/25/2019 1037   GLUCOSE 187 (H) 09/05/2017 2242   GLUCOSE 120 (H) 04/30/2014 1321   BUN 20 02/25/2019 1037   BUN 37 (H) 04/30/2014 1321   CREATININE 1.33 (H) 02/25/2019 1037   CREATININE 1.75 (H) 04/30/2014 1321   CALCIUM 9.1 02/25/2019 1037   CALCIUM 8.8 04/30/2014 1321   PROT 6.6 02/25/2019 1037   PROT 7.6 04/30/2014 1321   ALBUMIN 4.3 02/25/2019 1037   ALBUMIN 3.9 04/30/2014 1321   AST 15 02/25/2019 1037   AST 32 04/30/2014 1321   ALT 10 02/25/2019 1037   ALT 27 04/30/2014 1321   ALKPHOS 79 02/25/2019 1037   ALKPHOS 73 04/30/2014 1321   BILITOT 0.5 02/25/2019 1037   BILITOT 0.6 04/30/2014 1321    GFRNONAA 40 (L) 02/25/2019 1037   GFRNONAA 31 (L) 04/30/2014 1321   GFRNONAA 26 (L) 02/01/2014 1705   GFRAA 46 (L) 02/25/2019 1037   GFRAA 37 (L) 04/30/2014 1321   GFRAA 30 (L) 02/01/2014 1705       Component Value Date/Time   WBC 6.2 02/25/2019 1037   WBC 11.1 (H) 09/05/2017 2242   RBC 4.86 02/25/2019 1037   RBC 5.29 (H) 09/05/2017 2242   HGB 12.8 02/25/2019 1037   HCT 39.7 02/25/2019 1037   PLT 245 02/25/2019 1037   MCV 82 02/25/2019 1037   MCV 81 04/30/2014 1340   MCH 26.3 (L) 02/25/2019 1037   MCH 27.2 09/05/2017 2242   MCHC 32.2 02/25/2019 1037   MCHC 34.0 09/05/2017 2242   RDW 15.0 02/25/2019 1037   RDW 16.7 (H) 04/30/2014 1340   LYMPHSABS 2.8 02/25/2019 1037   LYMPHSABS 1.8 04/30/2014 1340   MONOABS 1.3 (H) 04/30/2014 1340   EOSABS 0.1 02/25/2019 1037   EOSABS 0.0 04/30/2014 1340   BASOSABS 0.1 02/25/2019 1037   BASOSABS 0.1 04/30/2014 1340    No results found for: POCLITH, LITHIUM   No results found for: PHENYTOIN, PHENOBARB, VALPROATE, CBMZ   .res Assessment: Plan:    Schizoaffective disorder, depressive type ([place]) - Plan: nortriptyline (PAMELOR) 50 MG capsule, perphenazine (TRILAFON) 4 MG tablet  Major depression, recurrent, full remission ([place]) - Plan: nortriptyline (PAMELOR) 50 MG capsule  Panic disorder with agoraphobia - Plan: nortriptyline (PAMELOR) 50 MG capsule  Generalized anxiety disorder - Plan: nortriptyline (PAMELOR) 50 MG capsule   Patient has a history of schizoaffective disorder generalized anxiety disorder and panic disorder and had been stable for many years.  She had a medical hospitalization for surgery and developed psychotic delirium in April 2019.  During that time in her recovery her nortriptyline dose was reduced by 50% to 50 mg daily, her perphenazine was discontinued and she was weaned off lorazepam.  She had no occasions  of relapse of psychotic symptoms since being off the perphenazine so we will leave her off that.  Her depression  has been stable with a lower dose no of nortriptyline so we will would not renew that.  Her panic disorder and has not recurred so we will not restart any benzodiazepine.  She will let us know if she has any recurrence of symptoms.  Doing well with only perphenazine 4 HS Nortriptyline 50 HS. Off lorazepam for a year or so.  No med changes indicated.  Call if any relapses.  Follow-up 12 months  Meredith Staggers, MD, DFAPA  Please see After Visit Summary for patient specific instructions.  No future appointments.  No orders of the defined types were placed in this encounter.     -------------------------------

## 2019-08-24 ENCOUNTER — Telehealth: Payer: Self-pay | Admitting: Psychiatry

## 2019-08-24 NOTE — Telephone Encounter (Signed)
[facility] on [address] in [place] called about pt's Trilafon that would cost her $112.46 after insurance pays. They said that either she can use a good rx card or get something else called in. Pt paid $30 last month and wants to pay $30 again.

## 2019-08-25 ENCOUNTER — Other Ambulatory Visit: Payer: Self-pay

## 2019-08-25 DIAGNOSIS — F251 Schizoaffective disorder, depressive type: Secondary | ICD-10-CM

## 2019-08-25 MED ORDER — PERPHENAZINE 4 MG PO TABS: ORAL_TABLET | ORAL | 3 refills | Status: DC

## 2019-08-25 NOTE — Telephone Encounter (Signed)
Contacted patient to discuss her Perphenazine 4 mg cost, advised her to use Good Rx and it would be cheaper at [facility] $38.60 for 90 day supply. She agreed and I explained Good [facility] to her. She asked for it be sent to [facility] on [address] [place]. Spoke with pharmacy and explained she would be a new transfer for them and asked if they could assist her with Good Rx they said that's no problem and asked to have Rx e-scribed.

## 2019-09-23 ENCOUNTER — Other Ambulatory Visit: Payer: Self-pay | Admitting: Physician Assistant

## 2019-09-23 DIAGNOSIS — E876 Hypokalemia: Secondary | ICD-10-CM

## 2019-09-23 MED ORDER — POTASSIUM CHLORIDE CRYS ER 10 MEQ PO TBCR: 10.0000 meq | EXTENDED_RELEASE_TABLET | Freq: Every day | ORAL | 1 refills | Status: DC

## 2019-09-23 NOTE — Telephone Encounter (Signed)
Medication Refill - Medication: potassium chloride (KLOR-CON M10) 10 MEQ   Has the patient contacted their pharmacy? No. (Agent: If no, request that the patient contact the pharmacy for the refill.) (Agent: If yes, when and what did the pharmacy advise?)  Preferred Pharmacy (with phone number or street name): [facility] on [address] [place]   Agent: Please be advised that RX refills may take up to 3 business days. We ask that you follow-up with your pharmacy.

## 2019-11-11 ENCOUNTER — Other Ambulatory Visit: Payer: Self-pay | Admitting: Physician Assistant

## 2019-11-11 DIAGNOSIS — K2100 Gastro-esophageal reflux disease with esophagitis, without bleeding: Secondary | ICD-10-CM

## 2019-12-22 ENCOUNTER — Other Ambulatory Visit: Payer: Self-pay

## 2020-02-09 ENCOUNTER — Other Ambulatory Visit: Payer: Self-pay | Admitting: Physician Assistant

## 2020-02-09 DIAGNOSIS — I10 Essential (primary) hypertension: Secondary | ICD-10-CM

## 2020-02-09 NOTE — Telephone Encounter (Signed)
Requested medications are due for refill today?  Yes  Requested medications are on active medication list?  Yes  Last Refill:  11/25/2018  # 90 with 3 refills   Future visit scheduled?  No   Notes to Clinic:  Medication failed Rx refill protocol due to no valid encounter in the past 6 months. Last visit was 11 months ago.

## 2020-02-12 ENCOUNTER — Telehealth: Payer: Self-pay | Admitting: Physician Assistant

## 2020-02-12 NOTE — Telephone Encounter (Signed)
Copied from CRM [id number]. Topic: Medicare AWV >> Feb 12, 2020  2:28 PM Claudette Laws R wrote: Reason for CRM:   No answer unable to leave message for patient to call back and schedule Medicare Annual Wellness Visit (AWV) either virtually or in office.  Last AWV 02/05/2019  Please schedule at anytime with [facility] Health Advisor.  If any questions, please contact me at [phone]

## 2020-04-18 ENCOUNTER — Other Ambulatory Visit: Payer: Self-pay | Admitting: Physician Assistant

## 2020-04-18 DIAGNOSIS — E876 Hypokalemia: Secondary | ICD-10-CM

## 2020-04-18 NOTE — Telephone Encounter (Signed)
Requested medication (s) are due for refill today:yes  Requested medication (s) are on the active medication list: yes  Last refill:  09/23/19  #90  1 refills  Future visit scheduled No  Notes to clinic:  Patient over due for OV. Attempted to call. Phone just rang. No option to leave message. Last CPE 02/25/19    Requested Prescriptions  Pending Prescriptions Disp Refills   potassium chloride (KLOR-CON) 10 MEQ tablet [Pharmacy Med Name: Potassium Chloride ER 10 MEQ Oral Tablet Extended Release] 90 tablet 0    Sig: Take 1 tablet by mouth daily      Endocrinology:  Minerals - Potassium Supplementation Failed - 04/18/2020  3:37 PM      Failed - K in normal range and within 360 days    Potassium  Date Value Ref Range Status  02/25/2019 3.3 (L) 3.5 - 5.2 mmol/L Final  04/30/2014 3.3 (L) 3.5 - 5.1 mmol/L Final          Failed - Cr in normal range and within 360 days    Creatinine  Date Value Ref Range Status  04/30/2014 1.75 (H) 0.60 - 1.30 mg/dL Final   Creatinine, Ser  Date Value Ref Range Status  02/25/2019 1.33 (H) 0.57 - 1.00 mg/dL Final          Failed - Valid encounter within last 12 months    Recent Outpatient Visits           1 year ago Annual physical exam   [facility] [place], [place], [place]   1 year ago Essential hypertension   [facility] [place], [place], [place]   1 year ago Gastroesophageal reflux disease with esophagitis   [facility] [place], [place], [place]   2 years ago Essential hypertension   [facility] [place], [place], [place]   2 years ago Annual physical exam   [facility] [place], [place], [place]

## 2020-04-20 ENCOUNTER — Other Ambulatory Visit: Payer: Self-pay | Admitting: Physician Assistant

## 2020-04-20 DIAGNOSIS — Z1231 Encounter for screening mammogram for malignant neoplasm of breast: Secondary | ICD-10-CM

## 2020-05-11 ENCOUNTER — Other Ambulatory Visit: Payer: Self-pay | Admitting: Physician Assistant

## 2020-05-11 DIAGNOSIS — E7800 Pure hypercholesterolemia, unspecified: Secondary | ICD-10-CM

## 2020-05-11 MED ORDER — SIMVASTATIN 40 MG PO TABS: 40.0000 mg | ORAL_TABLET | Freq: Every day | ORAL | 0 refills | Status: DC

## 2020-05-11 NOTE — Telephone Encounter (Signed)
Medication Refill - Medication: Simvastatin   Has the patient contacted their pharmacy? Yes.   (Agent: If no, request that the patient contact the pharmacy for the refill.) (Agent: If yes, when and what did the pharmacy advise?)  Preferred Pharmacy (with phone number or street name):  [facility] [address]), [place] - [address]  [address]) [place] [address]  Phone: [phone] Fax: [phone]  Hours: Not open 24 hours     Agent: Please be advised that RX refills may take up to 3 business days. We ask that you follow-up with your pharmacy.

## 2020-05-23 ENCOUNTER — Ambulatory Visit (INDEPENDENT_AMBULATORY_CARE_PROVIDER_SITE_OTHER): Payer: PPO | Admitting: Physician Assistant

## 2020-05-23 ENCOUNTER — Other Ambulatory Visit: Payer: Self-pay

## 2020-05-23 ENCOUNTER — Encounter: Payer: Self-pay | Admitting: Physician Assistant

## 2020-05-23 VITALS — BP 157/93 | HR 76 | Temp 97.6°F | Wt 175.6 lb

## 2020-05-23 DIAGNOSIS — E876 Hypokalemia: Secondary | ICD-10-CM

## 2020-05-23 DIAGNOSIS — E041 Nontoxic single thyroid nodule: Secondary | ICD-10-CM | POA: Diagnosis not present

## 2020-05-23 DIAGNOSIS — I10 Essential (primary) hypertension: Secondary | ICD-10-CM

## 2020-05-23 DIAGNOSIS — F251 Schizoaffective disorder, depressive type: Secondary | ICD-10-CM | POA: Diagnosis not present

## 2020-05-23 DIAGNOSIS — E7800 Pure hypercholesterolemia, unspecified: Secondary | ICD-10-CM

## 2020-05-23 DIAGNOSIS — K2100 Gastro-esophageal reflux disease with esophagitis, without bleeding: Secondary | ICD-10-CM | POA: Diagnosis not present

## 2020-05-23 MED ORDER — OMEPRAZOLE 40 MG PO CPDR: 40.0000 mg | DELAYED_RELEASE_CAPSULE | Freq: Two times a day (BID) | ORAL | 1 refills | Status: DC

## 2020-05-23 MED ORDER — SIMVASTATIN 40 MG PO TABS: 40.0000 mg | ORAL_TABLET | Freq: Every day | ORAL | 1 refills | Status: DC

## 2020-05-23 MED ORDER — POTASSIUM CHLORIDE ER 10 MEQ PO TBCR: 10.0000 meq | EXTENDED_RELEASE_TABLET | Freq: Every day | ORAL | 1 refills | Status: DC

## 2020-05-23 MED ORDER — ATENOLOL 50 MG PO TABS: 50.0000 mg | ORAL_TABLET | Freq: Every day | ORAL | 1 refills | Status: DC

## 2020-05-23 NOTE — Patient Instructions (Signed)
DASH Eating Plan DASH stands for "Dietary Approaches to Stop Hypertension." The DASH eating plan is a healthy eating plan that has been shown to reduce high blood pressure (hypertension). It may also reduce your risk for type 2 diabetes, heart disease, and stroke. The DASH eating plan may also help with weight loss. What are tips for following this plan?  General guidelines  Avoid eating more than 2,300 mg (milligrams) of salt (sodium) a day. If you have hypertension, you may need to reduce your sodium intake to 1,500 mg a day.  Limit alcohol intake to no more than 1 drink a day for nonpregnant women and 2 drinks a day for men. One drink equals 12 oz of beer, 5 oz of wine, or 1 oz of hard liquor.  Work with your health care provider to maintain a healthy body weight or to lose weight. Ask what an ideal weight is for you.  Get at least 30 minutes of exercise that causes your heart to beat faster (aerobic exercise) most days of the week. Activities may include walking, swimming, or biking.  Work with your health care provider or diet and nutrition specialist (dietitian) to adjust your eating plan to your individual calorie needs. Reading food labels   Check food labels for the amount of sodium per serving. Choose foods with less than 5 percent of the Daily Value of sodium. Generally, foods with less than 300 mg of sodium per serving fit into this eating plan.  To find whole grains, look for the word "whole" as the first word in the ingredient list. Shopping  Buy products labeled as "low-sodium" or "no salt added."  Buy fresh foods. Avoid canned foods and premade or frozen meals. Cooking  Avoid adding salt when cooking. Use salt-free seasonings or herbs instead of table salt or sea salt. Check with your health care provider or pharmacist before using salt substitutes.  Do not fry foods. Cook foods using healthy methods such as baking, boiling, grilling, and broiling instead.  Cook with  heart-healthy oils, such as olive, canola, soybean, or sunflower oil. Meal planning  Eat a balanced diet that includes: ? 5 or more servings of fruits and vegetables each day. At each meal, try to fill half of your plate with fruits and vegetables. ? Up to 6-8 servings of whole grains each day. ? Less than 6 oz of lean meat, poultry, or fish each day. A 3-oz serving of meat is about the same size as a deck of cards. One egg equals 1 oz. ? 2 servings of low-fat dairy each day. ? A serving of nuts, seeds, or beans 5 times each week. ? Heart-healthy fats. Healthy fats called Omega-3 fatty acids are found in foods such as flaxseeds and coldwater fish, like sardines, salmon, and mackerel.  Limit how much you eat of the following: ? Canned or prepackaged foods. ? Food that is high in trans fat, such as fried foods. ? Food that is high in saturated fat, such as fatty meat. ? Sweets, desserts, sugary drinks, and other foods with added sugar. ? Full-fat dairy products.  Do not salt foods before eating.  Try to eat at least 2 vegetarian meals each week.  Eat more home-cooked food and less restaurant, buffet, and fast food.  When eating at a restaurant, ask that your food be prepared with less salt or no salt, if possible. What foods are recommended? The items listed may not be a complete list. Talk with your dietitian about   what dietary choices are best for you. Grains Whole-grain or whole-wheat bread. Whole-grain or whole-wheat pasta. Brown rice. Oatmeal. Quinoa. Bulgur. Whole-grain and low-sodium cereals. Pita bread. Low-fat, low-sodium crackers. Whole-wheat flour tortillas. Vegetables Fresh or frozen vegetables (raw, steamed, roasted, or grilled). Low-sodium or reduced-sodium tomato and vegetable juice. Low-sodium or reduced-sodium tomato sauce and tomato paste. Low-sodium or reduced-sodium canned vegetables. Fruits All fresh, dried, or frozen fruit. Canned fruit in natural juice (without  added sugar). Meat and other protein foods Skinless chicken or turkey. Ground chicken or turkey. Pork with fat trimmed off. Fish and seafood. Egg whites. Dried beans, peas, or lentils. Unsalted nuts, nut butters, and seeds. Unsalted canned beans. Lean cuts of beef with fat trimmed off. Low-sodium, lean deli meat. Dairy Low-fat (1%) or fat-free (skim) milk. Fat-free, low-fat, or reduced-fat cheeses. Nonfat, low-sodium ricotta or cottage cheese. Low-fat or nonfat yogurt. Low-fat, low-sodium cheese. Fats and oils Soft margarine without trans fats. Vegetable oil. Low-fat, reduced-fat, or light mayonnaise and salad dressings (reduced-sodium). Canola, safflower, olive, soybean, and sunflower oils. Avocado. Seasoning and other foods Herbs. Spices. Seasoning mixes without salt. Unsalted popcorn and pretzels. Fat-free sweets. What foods are not recommended? The items listed may not be a complete list. Talk with your dietitian about what dietary choices are best for you. Grains Baked goods made with fat, such as croissants, muffins, or some breads. Dry pasta or rice meal packs. Vegetables Creamed or fried vegetables. Vegetables in a cheese sauce. Regular canned vegetables (not low-sodium or reduced-sodium). Regular canned tomato sauce and paste (not low-sodium or reduced-sodium). Regular tomato and vegetable juice (not low-sodium or reduced-sodium). Pickles. Olives. Fruits Canned fruit in a light or heavy syrup. Fried fruit. Fruit in cream or butter sauce. Meat and other protein foods Fatty cuts of meat. Ribs. Fried meat. Bacon. Sausage. Bologna and other processed lunch meats. Salami. Fatback. Hotdogs. Bratwurst. Salted nuts and seeds. Canned beans with added salt. Canned or smoked fish. Whole eggs or egg yolks. Chicken or turkey with skin. Dairy Whole or 2% milk, cream, and half-and-half. Whole or full-fat cream cheese. Whole-fat or sweetened yogurt. Full-fat cheese. Nondairy creamers. Whipped toppings.  Processed cheese and cheese spreads. Fats and oils Butter. Stick margarine. Lard. Shortening. Ghee. Bacon fat. Tropical oils, such as coconut, palm kernel, or palm oil. Seasoning and other foods Salted popcorn and pretzels. Onion salt, garlic salt, seasoned salt, table salt, and sea salt. Worcestershire sauce. Tartar sauce. Barbecue sauce. Teriyaki sauce. Soy sauce, including reduced-sodium. Steak sauce. Canned and packaged gravies. Fish sauce. Oyster sauce. Cocktail sauce. Horseradish that you find on the shelf. Ketchup. Mustard. Meat flavorings and tenderizers. Bouillon cubes. Hot sauce and Tabasco sauce. Premade or packaged marinades. Premade or packaged taco seasonings. Relishes. Regular salad dressings. Where to find more information:  National Heart, Lung, and Blood Institute: www.nhlbi.nih.gov  American Heart Association: www.heart.org Summary  The DASH eating plan is a healthy eating plan that has been shown to reduce high blood pressure (hypertension). It may also reduce your risk for type 2 diabetes, heart disease, and stroke.  With the DASH eating plan, you should limit salt (sodium) intake to 2,300 mg a day. If you have hypertension, you may need to reduce your sodium intake to 1,500 mg a day.  When on the DASH eating plan, aim to eat more fresh fruits and vegetables, whole grains, lean proteins, low-fat dairy, and heart-healthy fats.  Work with your health care provider or diet and nutrition specialist (dietitian) to adjust your eating plan to your   individual calorie needs. This information is not intended to replace advice given to you by your health care provider. Make sure you discuss any questions you have with your health care provider. Document Revised: 04/26/2017 Document Reviewed: 05/07/2016 Elsevier Patient Education  2020 Elsevier Inc.  

## 2020-05-23 NOTE — Progress Notes (Signed)
Established patient visit   Patient: Jessica Little   DOB: 11/05/1945   74 y.o. Female  MRN: [id number] Visit Date: 05/23/2020  Today's healthcare provider: Margaretann Loveless, PA-C   Chief Complaint  Patient presents with  . Hypertension  . Hyperlipidemia  I,Shauni Henner M Bauer Ausborn,acting as a Neurosurgeon for [employer], PA-C.,have documented all relevant documentation on the behalf of Margaretann Loveless, PA-C,as directed by  Margaretann Loveless, PA-C while in the presence of Margaretann Loveless, [place].  Subjective    HPI   Hypertension, follow-up  BP Readings from Last 3 Encounters:  05/23/20 (!) 157/93  02/25/19 (!) 150/88  10/24/18 121/78   Wt Readings from Last 3 Encounters:  05/23/20 175 lb 9.6 oz (79.7 kg)  02/25/19 161 lb 3.2 oz (73.1 kg)  10/24/18 154 lb 9.6 oz (70.1 kg)     She was last seen for hypertension 15 months ago.  BP at that visit was 150/88. Management since that visit includes continue current medication. She reports good compliance with treatment. She is not having side effects.  She is not exercising. She is adherent to low salt diet.   Outside blood pressures are not being checking.  She does not smoke.  Use of agents associated with hypertension: none.   --------------------------------------------------------------------------------------------------- Lipid/Cholesterol, follow-up  Last Lipid Panel: Lab Results  Component Value Date   CHOL 217 (H) 02/25/2019   LDLCALC 139 (H) 02/25/2019   HDL 58 02/25/2019   TRIG 114 02/25/2019    She was last seen for this 15 months ago.  Management since that visit includes continue current medication.  She reports good compliance with treatment. She is not having side effects.   Symptoms: No appetite changes No foot ulcerations  No chest pain No chest pressure/discomfort  No dyspnea No orthopnea  No fatigue No lower extremity edema  No palpitations No paroxysmal nocturnal dyspnea   No nausea No numbness or tingling of extremity  No polydipsia No polyuria  No speech difficulty No syncope   She is following a Regular diet. Current exercise: housecleaning and no regular exercise  Last metabolic panel Lab Results  Component Value Date   GLUCOSE 101 (H) 02/25/2019   NA 143 02/25/2019   K 3.3 (L) 02/25/2019   BUN 20 02/25/2019   CREATININE 1.33 (H) 02/25/2019   GFRNONAA 40 (L) 02/25/2019   GFRAA 46 (L) 02/25/2019   CALCIUM 9.1 02/25/2019   AST 15 02/25/2019   ALT 10 02/25/2019   The 10-year ASCVD risk score Denman George DC Jr., et al., 2013) is: 22.7%  ---------------------------------------------------------------------------------------------------   Patient Active Problem List   Diagnosis Date Noted  . Gastroesophageal reflux disease with esophagitis without hemorrhage 05/23/2020  . Hypokalemia 05/23/2020  . GAD (generalized anxiety disorder) 05/25/2018  . Osteopenia 10/10/2016  . H/O urinary frequency 07/06/2015  . Anxiety 02/03/2015  . Schizoaffective disorder, depressive type (HCC) 02/03/2015  . Acid reflux 02/03/2015  . HLD (hyperlipidemia) 02/03/2015  . BP (high blood pressure) 02/03/2015  . Cannot sleep 02/03/2015  . Dislocated shoulder 02/03/2015  . Thyroid nodule 02/03/2015   Past Medical History:  Diagnosis Date  . Anxiety   . GERD (gastroesophageal reflux disease)   . Hyperlipidemia   . Hypertension        Medications: Outpatient Medications Prior to Visit  Medication Sig  . calcium carbonate (OS-CAL) 600 MG TABS tablet Take 600 mg by mouth 2 (two) times daily with a meal.  . Cholecalciferol (VITAMIN  D3 PO) Take by mouth.  . nortriptyline (PAMELOR) 50 MG capsule Take 1 capsule (50 mg total) by mouth at bedtime.  Marland Kitchen perphenazine (TRILAFON) 4 MG tablet TAKE 1 TABLET BY MOUTH AT BEDTIME  . Pumpkin Seed-Soy Germ (AZO BLADDER CONTROL/GO-LESS) CAPS Take by mouth daily.   . vitamin E 1000 UNIT capsule Take 900 Units by mouth daily.   .  [DISCONTINUED] atenolol (TENORMIN) 50 MG tablet Take 1 tablet by mouth once daily  . [DISCONTINUED] omeprazole (PRILOSEC) 40 MG capsule Take 1 capsule by mouth twice daily  . [DISCONTINUED] potassium chloride (KLOR-CON) 10 MEQ tablet Take 1 tablet (10 mEq total) by mouth daily. Please schedule office visit before any future refills  . [DISCONTINUED] simvastatin (ZOCOR) 40 MG tablet Take 1 tablet (40 mg total) by mouth at bedtime.  Marland Kitchen Apple Cid Vn-Grn Tea-Bit Or-Cr (APPLE CIDER VINEGAR PLUS PO) Take by mouth. (Patient not taking: Reported on 05/23/2020)   No facility-administered medications prior to visit.    Review of Systems  Constitutional: Negative.   Respiratory: Negative.   Cardiovascular: Negative.   Neurological: Negative.   Hematological: Negative.     Last CBC Lab Results  Component Value Date   WBC 6.2 02/25/2019   HGB 12.8 02/25/2019   HCT 39.7 02/25/2019   MCV 82 02/25/2019   MCH 26.3 (L) 02/25/2019   RDW 15.0 02/25/2019   PLT 245 02/25/2019   Last metabolic panel Lab Results  Component Value Date   GLUCOSE 101 (H) 02/25/2019   NA 143 02/25/2019   K 3.3 (L) 02/25/2019   CL 103 02/25/2019   CO2 25 02/25/2019   BUN 20 02/25/2019   CREATININE 1.33 (H) 02/25/2019   GFRNONAA 40 (L) 02/25/2019   GFRAA 46 (L) 02/25/2019   CALCIUM 9.1 02/25/2019   PROT 6.6 02/25/2019   ALBUMIN 4.3 02/25/2019   LABGLOB 2.3 02/25/2019   AGRATIO 1.9 02/25/2019   BILITOT 0.5 02/25/2019   ALKPHOS 79 02/25/2019   AST 15 02/25/2019   ALT 10 02/25/2019   ANIONGAP 12 09/05/2017      Objective    BP (!) 157/93 (BP Location: Left Arm, Patient Position: Sitting, Cuff Size: Large)   Pulse 76   Temp 97.6 F (36.4 C) (Oral)   Wt 175 lb 9.6 oz (79.7 kg)   BMI 28.34 kg/m  BP Readings from Last 3 Encounters:  05/23/20 (!) 157/93  02/25/19 (!) 150/88  10/24/18 121/78   Wt Readings from Last 3 Encounters:  05/23/20 175 lb 9.6 oz (79.7 kg)  02/25/19 161 lb 3.2 oz (73.1 kg)   10/24/18 154 lb 9.6 oz (70.1 kg)      Physical Exam Constitutional:      Appearance: Normal appearance.  HENT:     Head: Normocephalic and atraumatic.  Cardiovascular:     Rate and Rhythm: Normal rate and regular rhythm.     Pulses: Normal pulses.     Heart sounds: Normal heart sounds. No murmur heard.   Pulmonary:     Effort: Pulmonary effort is normal. No respiratory distress.     Breath sounds: Normal breath sounds. No wheezing.  Musculoskeletal:     Cervical back: Normal range of motion and neck supple.     Right lower leg: No edema.     Left lower leg: No edema.  Skin:    General: Skin is warm and dry.     Capillary Refill: Capillary refill takes less than 2 seconds.  Neurological:     General:  No focal deficit present.     Mental Status: She is alert and oriented to person, place, and time. Mental status is at baseline.  Psychiatric:        Mood and Affect: Mood normal.        Behavior: Behavior normal.        Thought Content: Thought content normal.     No results found for any visits on 05/23/20.  Assessment & Plan     1. Primary hypertension Stable. Diagnosis pulled for medication refill. Continue current medical treatment plan. Will check labs as below and f/u pending results. - Comprehensive Metabolic Panel (CMET) - Lipid Panel With LDL/HDL Ratio - HgB A1c - CBC w/Diff/Platelet - TSH - atenolol (TENORMIN) 50 MG tablet; Take 1 tablet (50 mg total) by mouth daily.  Dispense: 90 tablet; Refill: 1  2. Thyroid nodule Stable. Asymptomatic. Will check labs as below and f/u pending results. - Comprehensive Metabolic Panel (CMET) - Lipid Panel With LDL/HDL Ratio - HgB A1c - CBC w/Diff/Platelet - TSH  3. Pure hypercholesterolemia Stable. Diagnosis pulled for medication refill. Continue current medical treatment plan. Will check labs as below and f/u pending results. - Comprehensive Metabolic Panel (CMET) - Lipid Panel With LDL/HDL Ratio - HgB A1c - CBC  w/Diff/Platelet - TSH - simvastatin (ZOCOR) 40 MG tablet; Take 1 tablet (40 mg total) by mouth at bedtime.  Dispense: 90 tablet; Refill: 1  4. Schizoaffective disorder, depressive type (HCC) Stable. Followed by Dr. Jennelle Human, psychiatry. Will check labs as below and f/u pending results. - Comprehensive Metabolic Panel (CMET) - Lipid Panel With LDL/HDL Ratio - HgB A1c - CBC w/Diff/Platelet - TSH  5. Gastroesophageal reflux disease with esophagitis without hemorrhage Stable. Diagnosis pulled for medication refill. Continue current medical treatment plan. - omeprazole (PRILOSEC) 40 MG capsule; Take 1 capsule (40 mg total) by mouth 2 (two) times daily.  Dispense: 180 capsule; Refill: 1  6. Hypokalemia Stable. Diagnosis pulled for medication refill. Continue current medical treatment plan. - potassium chloride (KLOR-CON) 10 MEQ tablet; Take 1 tablet (10 mEq total) by mouth daily.  Dispense: 90 tablet; Refill: 1   Return in about 6 months (around 11/21/2020).      Delmer Islam, PA-C, have reviewed all documentation for this visit. The documentation on 05/23/20 for the exam, diagnosis, procedures, and orders are all accurate and complete.   Reine Just  [facility] [phone] (phone) [phone] (fax)  [facility]

## 2020-05-24 LAB — COMPREHENSIVE METABOLIC PANEL
ALT: 13 IU/L (ref 0–32)
AST: 18 IU/L (ref 0–40)
Albumin/Globulin Ratio: 1.7 (ref 1.2–2.2)
Albumin: 4.3 g/dL (ref 3.7–4.7)
Alkaline Phosphatase: 89 IU/L (ref 44–121)
BUN/Creatinine Ratio: 13 (ref 12–28)
BUN: 17 mg/dL (ref 8–27)
Bilirubin Total: 0.4 mg/dL (ref 0.0–1.2)
CO2: 26 mmol/L (ref 20–29)
Calcium: 9.4 mg/dL (ref 8.7–10.3)
Chloride: 102 mmol/L (ref 96–106)
Creatinine, Ser: 1.34 mg/dL — ABNORMAL HIGH (ref 0.57–1.00)
GFR calc Af Amer: 46 mL/min/{1.73_m2} — ABNORMAL LOW (ref >59)
GFR calc non Af Amer: 40 mL/min/{1.73_m2} — ABNORMAL LOW (ref >59)
Globulin, Total: 2.6 g/dL (ref 1.5–4.5)
Glucose: 84 mg/dL (ref 65–99)
Potassium: 4 mmol/L (ref 3.5–5.2)
Sodium: 141 mmol/L (ref 134–144)
Total Protein: 6.9 g/dL (ref 6.0–8.5)

## 2020-05-24 LAB — CBC WITH DIFFERENTIAL/PLATELET
Basophils Absolute: 0.1 10*3/uL (ref 0.0–0.2)
Basos: 1 %
EOS (ABSOLUTE): 0.1 10*3/uL (ref 0.0–0.4)
Eos: 1 %
Hematocrit: 39.8 % (ref 34.0–46.6)
Hemoglobin: 13.1 g/dL (ref 11.1–15.9)
Immature Grans (Abs): 0 10*3/uL (ref 0.0–0.1)
Immature Granulocytes: 0 %
Lymphocytes Absolute: 2.5 10*3/uL (ref 0.7–3.1)
Lymphs: 36 %
MCH: 26.6 pg (ref 26.6–33.0)
MCHC: 32.9 g/dL (ref 31.5–35.7)
MCV: 81 fL (ref 79–97)
Monocytes Absolute: 0.7 10*3/uL (ref 0.1–0.9)
Monocytes: 10 %
Neutrophils Absolute: 3.6 10*3/uL (ref 1.4–7.0)
Neutrophils: 52 %
Platelets: 236 10*3/uL (ref 150–450)
RBC: 4.92 x10E6/uL (ref 3.77–5.28)
RDW: 15.1 % (ref 11.7–15.4)
WBC: 7.1 10*3/uL (ref 3.4–10.8)

## 2020-05-24 LAB — HEMOGLOBIN A1C
Est. average glucose Bld gHb Est-mCnc: 114 mg/dL
Hgb A1c MFr Bld: 5.6 % (ref 4.8–5.6)

## 2020-05-24 LAB — LIPID PANEL WITH LDL/HDL RATIO
Cholesterol, Total: 192 mg/dL (ref 100–199)
HDL: 57 mg/dL (ref >39)
LDL Chol Calc (NIH): 107 mg/dL — ABNORMAL HIGH (ref 0–99)
LDL/HDL Ratio: 1.9 ratio (ref 0.0–3.2)
Triglycerides: 160 mg/dL — ABNORMAL HIGH (ref 0–149)
VLDL Cholesterol Cal: 28 mg/dL (ref 5–40)

## 2020-05-24 LAB — TSH: TSH: 2.3 u[IU]/mL (ref 0.450–4.500)

## 2020-06-02 ENCOUNTER — Other Ambulatory Visit: Payer: Self-pay

## 2020-06-02 ENCOUNTER — Telehealth: Payer: Self-pay

## 2020-06-02 ENCOUNTER — Ambulatory Visit
Admission: RE | Admit: 2020-06-02 | Discharge: 2020-06-02 | Disposition: A | Discharge status: Home / Self Care | Payer: Medicare Other | Source: Ambulatory Visit | Attending: Physician Assistant | Admitting: Physician Assistant

## 2020-06-02 DIAGNOSIS — Z1231 Encounter for screening mammogram for malignant neoplasm of breast: Secondary | ICD-10-CM | POA: Diagnosis not present

## 2020-06-02 IMAGING — MG DIGITAL SCREENING BILAT W/ TOMO W/ CAD
6 of 10 series · 6 of 30 positions shown · non-contrast
Comparison: Previous exam(s).

CLINICAL DATA: Screening.

EXAM:
DIGITAL SCREENING BILATERAL MAMMOGRAM WITH TOMO AND CAD

[L CC synth-2D (1 of 2)]
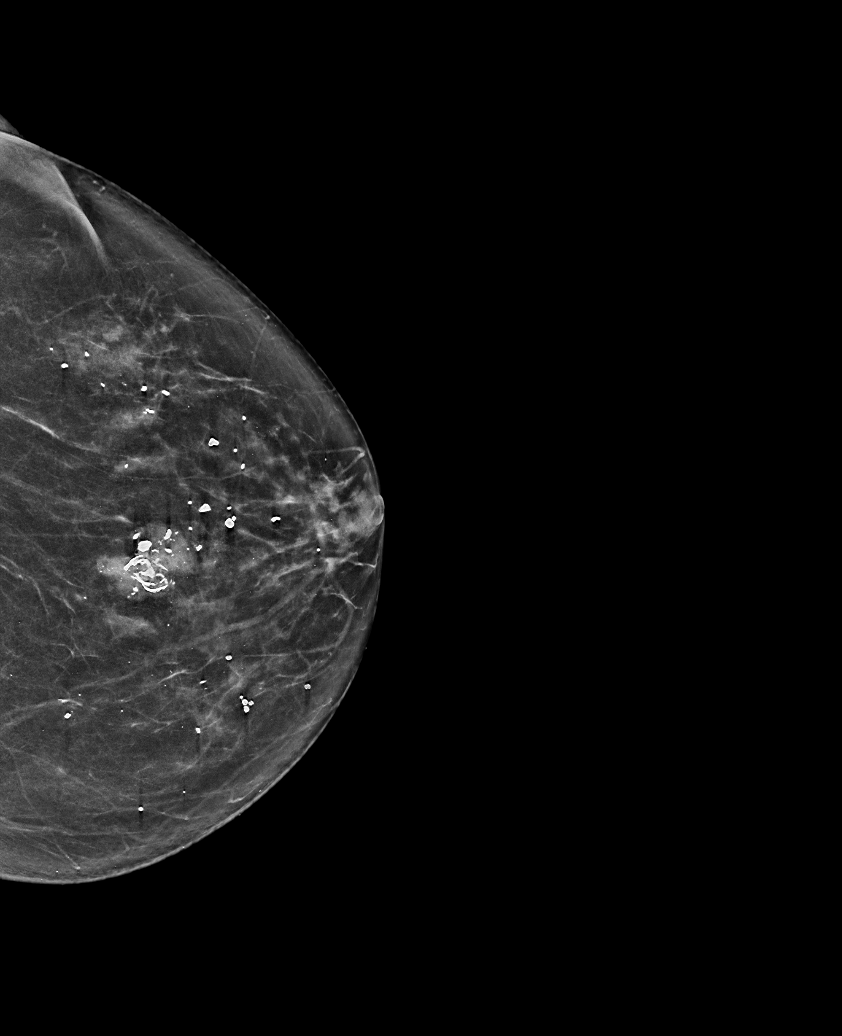

[L CC synth-2D (2 of 2)]
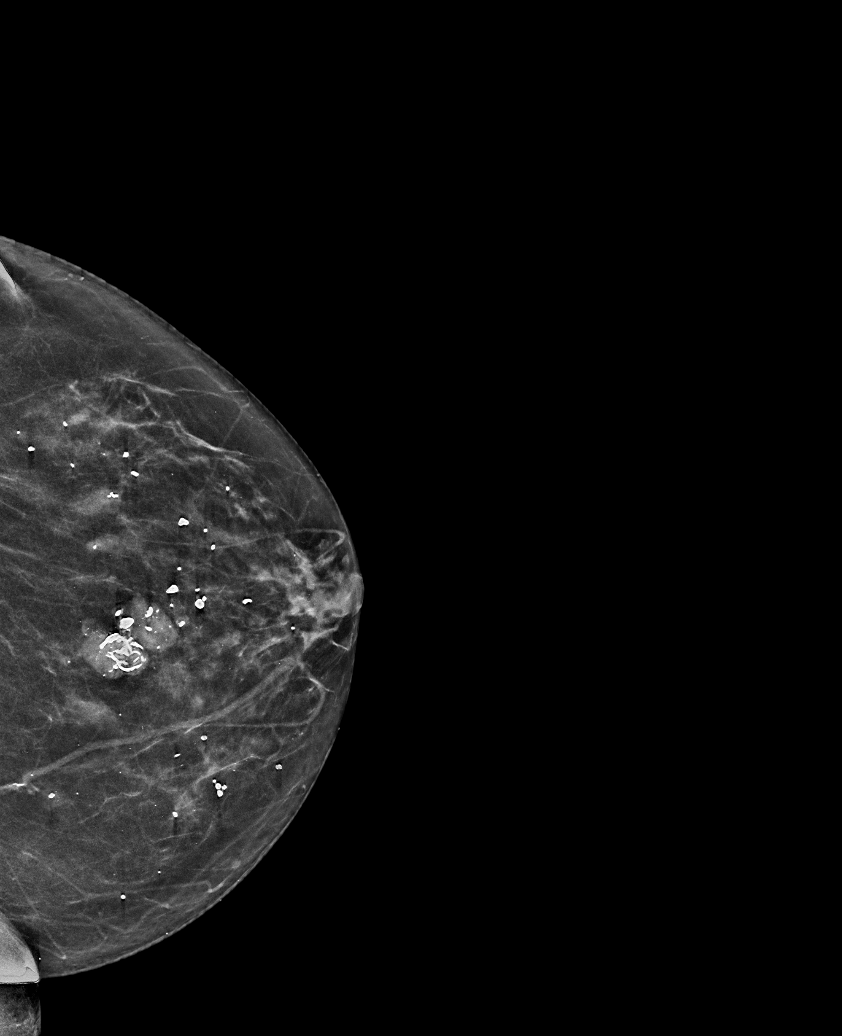

[R MLO synth-2D]
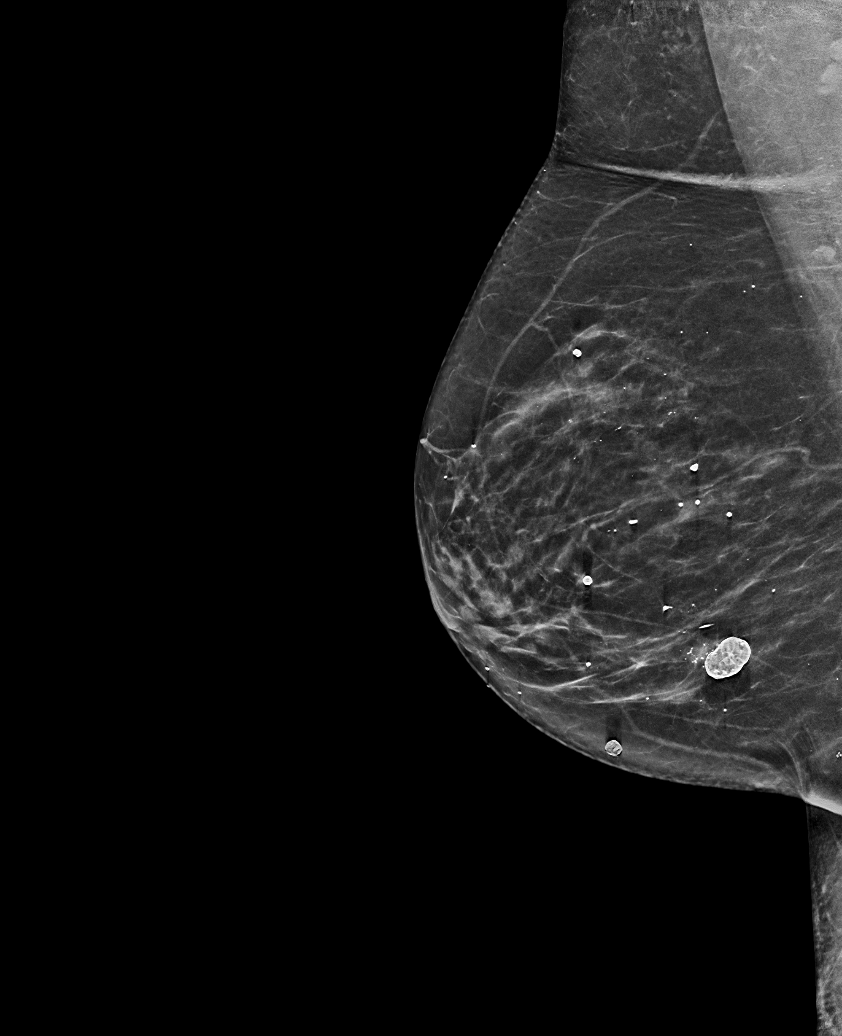

[L MLO synth-2D]
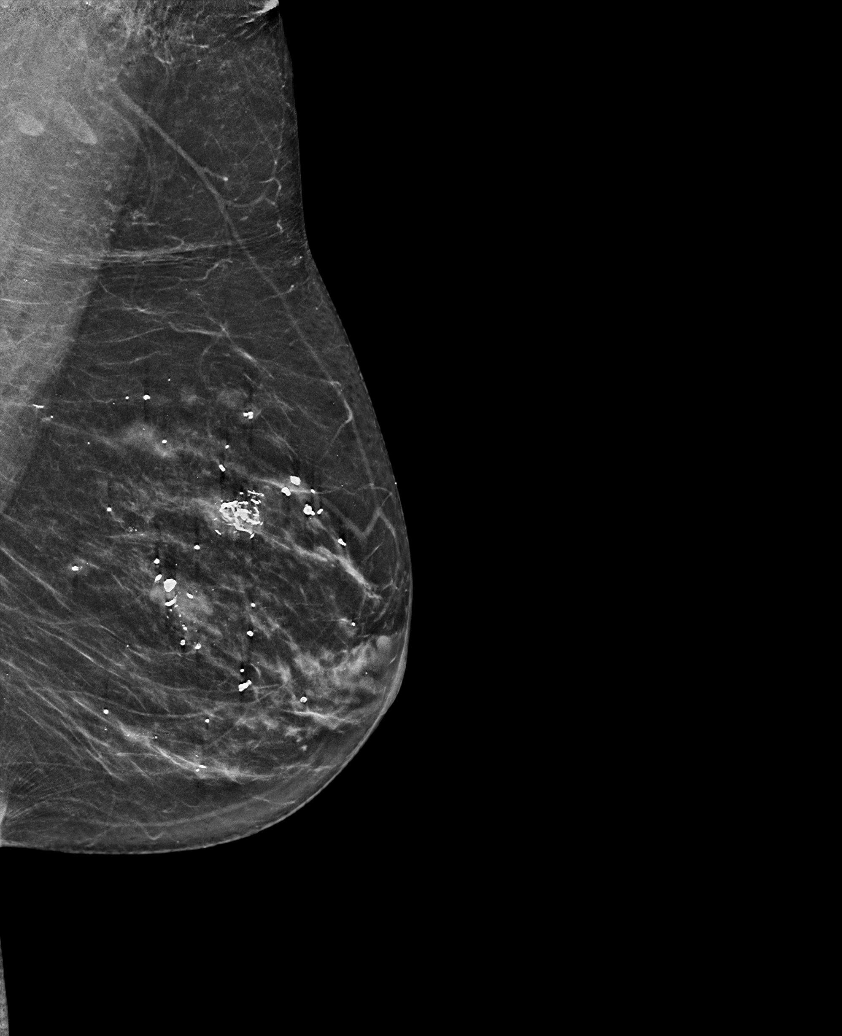

[R CC synth-2D]
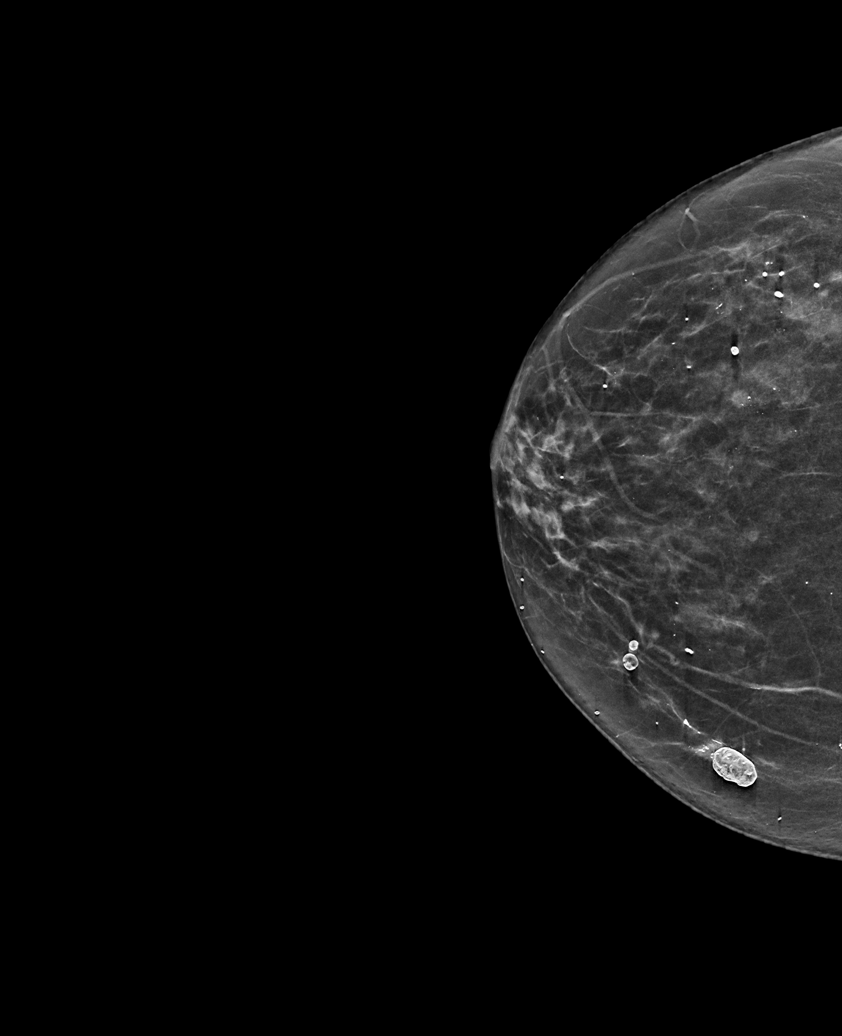

[L CC tomo · tomo slice 35/68.0]
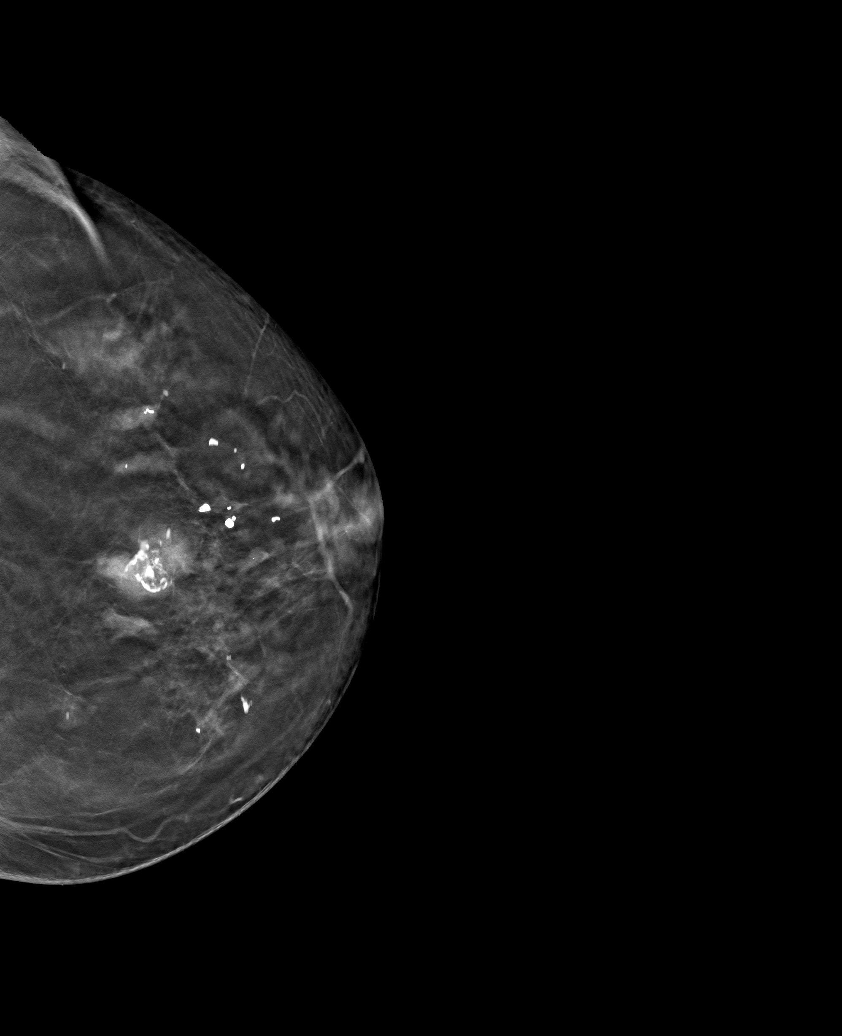

[6 of 30 positions shown; findings below may reference images not displayed]

ACR Breast Density Category c: The breast tissue is heterogeneously
dense, which may obscure small masses.
FINDINGS: There are no findings suspicious for malignancy. Images were
processed with CAD.
IMPRESSION: No mammographic evidence of malignancy. A result letter of this
screening mammogram will be mailed directly to the patient.

RECOMMENDATION:
Screening mammogram in one year. (Code:[5V])

BI-RADS CATEGORY  1: Negative.

## 2020-06-02 NOTE — Telephone Encounter (Signed)
Tried calling; no answer.  PEC please advise pt of her mammogram results if she calls back.   Thanks,   -Vernona Rieger

## 2020-06-02 NOTE — Telephone Encounter (Signed)
-----   Message from Margaretann Loveless, [place] sent at 06/02/2020  3:43 PM EST ----- Normal mammogram. Repeat screening in one year.

## 2020-06-06 NOTE — Telephone Encounter (Signed)
Patient advised as below.  

## 2020-06-20 ENCOUNTER — Ambulatory Visit: Payer: PPO | Admitting: Psychiatry

## 2020-06-20 NOTE — Progress Notes (Signed)
Subjective:   Jessica Little is a 75 y.o. female who presents for Medicare Annual (Subsequent) preventive examination.  I connected with Temeca Fosnaugh today by telephone and verified that I am speaking with the correct person using two identifiers. Location patient: home Location provider: work Persons participating in the virtual visit: patient, provider.   I discussed the limitations, risks, security and privacy concerns of performing an evaluation and management service by telephone and the availability of in person appointments. I also discussed with the patient that there may be a patient responsible charge related to this service. The patient expressed understanding and verbally consented to this telephonic visit.    Interactive audio and video telecommunications were attempted between this provider and patient, however failed, due to patient having technical difficulties OR patient did not have access to video capability.  We continued and completed visit with audio only.   Review of Systems    N/A  Cardiac Risk Factors include: advanced age (>59men, >87 women);hypertension;dyslipidemia     Objective:    There were no vitals filed for this visit. There is no height or weight on file to calculate BMI.  Advanced Directives 06/21/2020 02/05/2019 12/10/2017 10/05/2016 08/23/2015 07/06/2015  Does Patient Have a Medical Advance Directive? Yes Yes Yes Yes Yes Yes  Type of Estate agent of [place];Living will Healthcare Power of [place];Living will Healthcare Power of [place];Living will Living will Living will Healthcare Power of Attorney  Copy of Healthcare Power of Attorney in Chart? No - copy requested No - copy requested No - copy requested - No - copy requested -    Current Medications (verified) Outpatient Encounter Medications as of 06/21/2020  Medication Sig  . Apple Cid Vn-Grn Tea-Bit Or-Cr (APPLE CIDER VINEGAR PLUS PO) Take by mouth.  Marland Kitchen atenolol  (TENORMIN) 50 MG tablet Take 1 tablet (50 mg total) by mouth daily.  . calcium carbonate (OS-CAL) 600 MG TABS tablet Take 600 mg by mouth daily with breakfast.  . Cholecalciferol (VITAMIN D3 PO) Take by mouth daily at 6 (six) AM.  . nortriptyline (PAMELOR) 50 MG capsule Take 1 capsule (50 mg total) by mouth at bedtime.  Marland Kitchen omeprazole (PRILOSEC) 40 MG capsule Take 1 capsule (40 mg total) by mouth 2 (two) times daily.  Marland Kitchen perphenazine (TRILAFON) 4 MG tablet TAKE 1 TABLET BY MOUTH AT BEDTIME  . potassium chloride (KLOR-CON) 10 MEQ tablet Take 1 tablet (10 mEq total) by mouth daily.  . Pumpkin Seed-Soy Germ (AZO BLADDER CONTROL/GO-LESS) CAPS Take by mouth daily.   . simvastatin (ZOCOR) 40 MG tablet Take 1 tablet (40 mg total) by mouth at bedtime.  . vitamin E 1000 UNIT capsule Take 1,000 Units by mouth daily.  . [DISCONTINUED] potassium chloride (KLOR-CON M10) 10 MEQ tablet Take 1 tablet (10 mEq total) by mouth daily.   No facility-administered encounter medications on file as of 06/21/2020.    Allergies (verified) Patient has no known allergies.   History: Past Medical History:  Diagnosis Date  . Anxiety   . GERD (gastroesophageal reflux disease)   . Hyperlipidemia   . Hypertension    Past Surgical History:  Procedure Laterality Date  . CHOLECYSTECTOMY    . COLONOSCOPY  01/01/2006  . COLONOSCOPY WITH PROPOFOL N/A 08/23/2015   Procedure: COLONOSCOPY WITH PROPOFOL;  Surgeon: Earline Mayotte, MD;  Location: [facility] ENDOSCOPY;  Service: Endoscopy;  Laterality: N/A;   Family History  Problem Relation Age of Onset  . Arrhythmia Mother   . Heart  attack Father   . Stroke Father   . Healthy Sister   . Healthy Brother   . Healthy Sister   . Breast cancer Neg Hx    Social History   Socioeconomic History  . Marital status: Widowed    Spouse name: Not on file  . Number of children: 0  . Years of education: College  . Highest education level: Bachelor's degree (e.g., BA, AB, BS)   Occupational History  . Occupation: Retired  Tobacco Use  . Smoking status: Never Smoker  . Smokeless tobacco: Never Used  Vaping Use  . Vaping Use: Never used  Substance and Sexual Activity  . Alcohol use: No  . Drug use: No  . Sexual activity: Not on file  Other Topics Concern  . Not on file  Social History Narrative  . Not on file   Social Determinants of Health   Financial Resource Strain: Low Risk   . Difficulty of Paying Living Expenses: Not hard at all  Food Insecurity: No Food Insecurity  . Worried About Programme researcher, broadcasting/film/video in the Last Year: Never true  . Ran Out of Food in the Last Year: Never true  Transportation Needs: No Transportation Needs  . Lack of Transportation (Medical): No  . Lack of Transportation (Non-Medical): No  Physical Activity: Insufficiently Active  . Days of Exercise per Week: 2 days  . Minutes of Exercise per Session: 40 min  Stress: No Stress Concern Present  . Feeling of Stress : Not at all  Social Connections: Moderately Isolated  . Frequency of Communication with Friends and Family: More than three times a week  . Frequency of Social Gatherings with Friends and Family: More than three times a week  . Attends Religious Services: More than 4 times per year  . Active Member of Clubs or Organizations: No  . Attends Banker Meetings: Never  . Marital Status: Widowed    Tobacco Counseling Counseling given: Not Answered   Clinical Intake:  Pre-visit preparation completed: Yes  Pain : No/denies pain     Nutritional Risks: None Diabetes: No  How often do you need to have someone help you when you read instructions, pamphlets, or other written materials from your doctor or pharmacy?: 1 - Never  Diabetic? No  Interpreter Needed?: No  Information entered by :: [facility], LPN   Activities of Daily Living In your present state of health, do you have any difficulty performing the following activities: 06/21/2020  05/23/2020  Hearing? N N  Vision? N N  Difficulty concentrating or making decisions? N N  Walking or climbing stairs? N N  Dressing or bathing? N N  Doing errands, shopping? N N  Preparing Food and eating ? N -  Using the Toilet? N -  In the past six months, have you accidently leaked urine? N -  Do you have problems with loss of bowel control? N -  Managing your Medications? N -  Managing your Finances? N -  Housekeeping or managing your Housekeeping? N -  Some recent data might be hidden    Patient Care Team: Margaretann Loveless, PA-C as PCP - General (Family Medicine) Cottle, Steva Ready., MD as Attending Physician (Psychiatry) Pa, [facility])  Indicate any recent Medical Services you may have received from other than [facility] providers in the past year (date may be approximate).     Assessment:   This is a routine wellness examination for Kaysey.  Hearing/Vision screen  No exam data present  Dietary issues and exercise activities discussed: Current Exercise Habits: Structured exercise class, Type of exercise: Other - see comments (swimming), Time (Minutes): 45, Frequency (Times/Week): 2, Weekly Exercise (Minutes/Week): 90, Intensity: Mild, Exercise limited by: None identified  Goals    . DIET - INCREASE WATER INTAKE     Recommend increasing water intake to 6-8 8 oz glasses a day.       Depression Screen PHQ 2/9 Scores 06/21/2020 05/23/2020 02/25/2019 02/05/2019 12/10/2017 11/07/2017 10/05/2016  PHQ - 2 Score 0 0 0 0 0 1 0  PHQ- 9 Score - 2 0 - - - 1    Fall Risk Fall Risk  06/21/2020 05/23/2020 12/22/2019 02/25/2019 02/05/2019  Falls in the past year? 0 0 0 0 0  Comment - - Emmi Telephone Survey: data to providers prior to load - -  Number falls in past yr: 0 0 - 0 -  Comment - - - - -  Injury with Fall? 0 0 - 0 -  Risk for fall due to : - No Fall Risks - - -  Follow up - Falls evaluation completed - - -    FALL RISK PREVENTION PERTAINING TO THE  HOME:  Any stairs in or around the home? Yes  If so, are there any without handrails? No  Home free of loose throw rugs in walkways, pet beds, electrical cords, etc? Yes  Adequate lighting in your home to reduce risk of falls? Yes   ASSISTIVE DEVICES UTILIZED TO PREVENT FALLS:  Life alert? No  Use of a cane, walker or w/c? No  Grab bars in the bathroom? Yes  Shower chair or bench in shower? No  Elevated toilet seat or a handicapped toilet? No    Cognitive Function:     6CIT Screen 10/05/2016  What Year? 0 points  What month? 0 points  What time? 0 points  Count back from 20 0 points  Months in reverse 0 points  Repeat phrase 2 points  Total Score 2    Immunizations Immunization History  Administered Date(s) Administered  . Hepatitis A 08/13/2001  . Moderna Sars-Covid-2 Vaccination 10/14/2019, 11/11/2019  . Pneumococcal Conjugate-13 07/06/2015  . Pneumococcal Polysaccharide-23 10/05/2016  . Td 10/24/2000    TDAP status: Due, Education has been provided regarding the importance of this vaccine. Advised may receive this vaccine at local pharmacy or Health Dept. Aware to provide a copy of the vaccination record if obtained from local pharmacy or Health Dept. Verbalized acceptance and understanding.  Flu Vaccine status: Declined, Education has been provided regarding the importance of this vaccine but patient still declined. Advised may receive this vaccine at local pharmacy or Health Dept. Aware to provide a copy of the vaccination record if obtained from local pharmacy or Health Dept. Verbalized acceptance and understanding.  Pneumococcal vaccine status: Up to date  Covid-19 vaccine status: Completed vaccines  Qualifies for Shingles Vaccine? Yes   Zostavax completed No   Shingrix Completed?: No.    Education has been provided regarding the importance of this vaccine. Patient has been advised to call insurance company to determine out of pocket expense if they have not yet  received this vaccine. Advised may also receive vaccine at local pharmacy or Health Dept. Verbalized acceptance and understanding.  Screening Tests Health Maintenance  Topic Date Due  . COVID-19 Vaccine (3 - Booster for Moderna series) 05/12/2020  . TETANUS/TDAP  05/28/2026 (Originally 10/25/2010)  . DEXA SCAN  01/02/2022  . MAMMOGRAM  06/02/2022  . COLONOSCOPY (Pts 45-38yrs Insurance coverage will need to be confirmed)  08/22/2025  . Hepatitis C Screening  Completed  . PNA vac Low Risk Adult  Completed    Health Maintenance  Health Maintenance Due  Topic Date Due  . COVID-19 Vaccine (3 - Booster for Moderna series) 05/12/2020    Colorectal cancer screening: Type of screening: Colonoscopy. Completed 08/23/15. Repeat every 10 years  Mammogram status: Completed 06/02/20. Repeat every year  Bone Density status: Completed 01/02/17. Results reflect: Bone density results: OSTEOPENIA. Repeat every 5 years.  Lung Cancer Screening: (Low Dose CT Chest recommended if Age 80-80 years, 30 pack-year currently smoking OR have quit w/in 15years.) does not qualify.   Additional Screening:  Hepatitis C Screening: Up to date  Vision Screening: Recommended annual ophthalmology exams for early detection of glaucoma and other disorders of the eye. Is the patient up to date with their annual eye exam?  Yes  Who is the provider or what is the name of the office in which the patient attends annual eye exams? [facility] If pt is not established with a provider, would they like to be referred to a provider to establish care? No .   Dental Screening: Recommended annual dental exams for proper oral hygiene  Community Resource Referral / Chronic Care Management: CRR required this visit?  No   CCM required this visit?  No      Plan:     I have personally reviewed and noted the following in the patient's chart:   . Medical and social history . Use of alcohol, tobacco or illicit drugs   . Current medications and supplements . Functional ability and status . Nutritional status . Physical activity . Advanced directives . List of other physicians . Hospitalizations, surgeries, and ER visits in previous 12 months . Vitals . Screenings to include cognitive, depression, and falls . Referrals and appointments  In addition, I have reviewed and discussed with patient certain preventive protocols, quality metrics, and best practice recommendations. A written personalized care plan for preventive services as well as general preventive health recommendations were provided to patient.     Zacchaeus Halm [place], [place]   [id number]   Nurse Notes: Pt declined receiving a future Covid booster vaccine.

## 2020-06-21 ENCOUNTER — Ambulatory Visit (INDEPENDENT_AMBULATORY_CARE_PROVIDER_SITE_OTHER): Payer: Medicare Other

## 2020-06-21 ENCOUNTER — Other Ambulatory Visit: Payer: Self-pay

## 2020-06-21 DIAGNOSIS — Z0000 Encounter for general adult medical examination without abnormal findings: Secondary | ICD-10-CM | POA: Diagnosis not present

## 2020-06-21 NOTE — Patient Instructions (Signed)
Jessica Little , Thank you for taking time to come for your Medicare Wellness Visit. I appreciate your ongoing commitment to your health goals. Please review the following plan we discussed and let me know if I can assist you in the future.   Screening recommendations/referrals: Colonoscopy: Up to date, due 07/2025 Mammogram: Up to date, due 05/2021 Bone Density: Up to date, due 12/2021 Recommended yearly ophthalmology/optometry visit for glaucoma screening and checkup Recommended yearly dental visit for hygiene and checkup  Vaccinations: Pneumococcal vaccine: Completed series Tdap vaccine: Currently due, declined receiving. Shingles vaccine: Shingrix discussed. Please contact your pharmacy for coverage information.     Advanced directives: Please bring a copy of your POA (Power of Attorney) and/or Living Will to your next appointment.   Conditions/risks identified: Recommend increasing water intake to 6-8 8 oz glasses a day.   Next appointment: None, declined scheduling a follow up with PCP or an AWV for 2023 at this time.    Preventive Care 35 Years and Older, Female Preventive care refers to lifestyle choices and visits with your health care provider that can promote health and wellness. What does preventive care include?  A yearly physical exam. This is also called an annual well check.  Dental exams once or twice a year.  Routine eye exams. Ask your health care provider how often you should have your eyes checked.  Personal lifestyle choices, including:  Daily care of your teeth and gums.  Regular physical activity.  Eating a healthy diet.  Avoiding tobacco and drug use.  Limiting alcohol use.  Practicing safe sex.  Taking low-dose aspirin every day.  Taking vitamin and mineral supplements as recommended by your health care provider. What happens during an annual well check? The services and screenings done by your health care provider during your annual well check  will depend on your age, overall health, lifestyle risk factors, and family history of disease. Counseling  Your health care provider may ask you questions about your:  Alcohol use.  Tobacco use.  Drug use.  Emotional well-being.  Home and relationship well-being.  Sexual activity.  Eating habits.  History of falls.  Memory and ability to understand (cognition).  Work and work Astronomer.  Reproductive health. Screening  You may have the following tests or measurements:  Height, weight, and BMI.  Blood pressure.  Lipid and cholesterol levels. These may be checked every 5 years, or more frequently if you are over 56 years old.  Skin check.  Lung cancer screening. You may have this screening every year starting at age 42 if you have a 30-pack-year history of smoking and currently smoke or have quit within the past 15 years.  Fecal occult blood test (FOBT) of the stool. You may have this test every year starting at age 62.  Flexible sigmoidoscopy or colonoscopy. You may have a sigmoidoscopy every 5 years or a colonoscopy every 10 years starting at age 68.  Hepatitis C blood test.  Hepatitis B blood test.  Sexually transmitted disease (STD) testing.  Diabetes screening. This is done by checking your blood sugar (glucose) after you have not eaten for a while (fasting). You may have this done every 1-3 years.  Bone density scan. This is done to screen for osteoporosis. You may have this done starting at age 43.  Mammogram. This may be done every 1-2 years. Talk to your health care provider about how often you should have regular mammograms. Talk with your health care provider about your test  results, treatment options, and if necessary, the need for more tests. Vaccines  Your health care provider may recommend certain vaccines, such as:  Influenza vaccine. This is recommended every year.  Tetanus, diphtheria, and acellular pertussis (Tdap, Td) vaccine. You may  need a Td booster every 10 years.  Zoster vaccine. You may need this after age 55.  Pneumococcal 13-valent conjugate (PCV13) vaccine. One dose is recommended after age 89.  Pneumococcal polysaccharide (PPSV23) vaccine. One dose is recommended after age 44. Talk to your health care provider about which screenings and vaccines you need and how often you need them. This information is not intended to replace advice given to you by your health care provider. Make sure you discuss any questions you have with your health care provider. Document Released: 06/10/2015 Document Revised: 02/01/2016 Document Reviewed: 03/15/2015 Elsevier Interactive Patient Education  2017 [place] Prevention in the Home Falls can cause injuries. They can happen to people of all ages. There are many things you can do to make your home safe and to help prevent falls. What can I do on the outside of my home?  Regularly fix the edges of walkways and driveways and fix any cracks.  Remove anything that might make you trip as you walk through a door, such as a raised step or threshold.  Trim any bushes or trees on the path to your home.  Use bright outdoor lighting.  Clear any walking paths of anything that might make someone trip, such as rocks or tools.  Regularly check to see if handrails are loose or broken. Make sure that both sides of any steps have handrails.  Any raised decks and porches should have guardrails on the edges.  Have any leaves, snow, or ice cleared regularly.  Use sand or salt on walking paths during winter.  Clean up any spills in your garage right away. This includes oil or grease spills. What can I do in the bathroom?  Use night lights.  Install grab bars by the toilet and in the tub and shower. Do not use towel bars as grab bars.  Use non-skid mats or decals in the tub or shower.  If you need to sit down in the shower, use a plastic, non-slip stool.  Keep the floor  dry. Clean up any water that spills on the floor as soon as it happens.  Remove soap buildup in the tub or shower regularly.  Attach bath mats securely with double-sided non-slip rug tape.  Do not have throw rugs and other things on the floor that can make you trip. What can I do in the bedroom?  Use night lights.  Make sure that you have a light by your bed that is easy to reach.  Do not use any sheets or blankets that are too big for your bed. They should not hang down onto the floor.  Have a firm chair that has side arms. You can use this for support while you get dressed.  Do not have throw rugs and other things on the floor that can make you trip. What can I do in the kitchen?  Clean up any spills right away.  Avoid walking on wet floors.  Keep items that you use a lot in easy-to-reach places.  If you need to reach something above you, use a strong step stool that has a grab bar.  Keep electrical cords out of the way.  Do not use floor polish or wax that makes  floors slippery. If you must use wax, use non-skid floor wax.  Do not have throw rugs and other things on the floor that can make you trip. What can I do with my stairs?  Do not leave any items on the stairs.  Make sure that there are handrails on both sides of the stairs and use them. Fix handrails that are broken or loose. Make sure that handrails are as long as the stairways.  Check any carpeting to make sure that it is firmly attached to the stairs. Fix any carpet that is loose or worn.  Avoid having throw rugs at the top or bottom of the stairs. If you do have throw rugs, attach them to the floor with carpet tape.  Make sure that you have a light switch at the top of the stairs and the bottom of the stairs. If you do not have them, ask someone to add them for you. What else can I do to help prevent falls?  Wear shoes that:  Do not have high heels.  Have rubber bottoms.  Are comfortable and fit you  well.  Are closed at the toe. Do not wear sandals.  If you use a stepladder:  Make sure that it is fully opened. Do not climb a closed stepladder.  Make sure that both sides of the stepladder are locked into place.  Ask someone to hold it for you, if possible.  Clearly mark and make sure that you can see:  Any grab bars or handrails.  First and last steps.  Where the edge of each step is.  Use tools that help you move around (mobility aids) if they are needed. These include:  Canes.  Walkers.  Scooters.  Crutches.  Turn on the lights when you go into a dark area. Replace any light bulbs as soon as they burn out.  Set up your furniture so you have a clear path. Avoid moving your furniture around.  If any of your floors are uneven, fix them.  If there are any pets around you, be aware of where they are.  Review your medicines with your doctor. Some medicines can make you feel dizzy. This can increase your chance of falling. Ask your doctor what other things that you can do to help prevent falls. This information is not intended to replace advice given to you by your health care provider. Make sure you discuss any questions you have with your health care provider. Document Released: 03/10/2009 Document Revised: 10/20/2015 Document Reviewed: 06/18/2014 Elsevier Interactive Patient Education  2017 [employer].

## 2020-07-04 ENCOUNTER — Encounter: Payer: Self-pay | Admitting: Psychiatry

## 2020-07-04 ENCOUNTER — Ambulatory Visit (INDEPENDENT_AMBULATORY_CARE_PROVIDER_SITE_OTHER): Payer: Medicare Other | Admitting: Psychiatry

## 2020-07-04 ENCOUNTER — Other Ambulatory Visit: Payer: Self-pay | Admitting: Physician Assistant

## 2020-07-04 ENCOUNTER — Other Ambulatory Visit: Payer: Self-pay

## 2020-07-04 DIAGNOSIS — F251 Schizoaffective disorder, depressive type: Secondary | ICD-10-CM

## 2020-07-04 DIAGNOSIS — F411 Generalized anxiety disorder: Secondary | ICD-10-CM | POA: Diagnosis not present

## 2020-07-04 DIAGNOSIS — F4001 Agoraphobia with panic disorder: Secondary | ICD-10-CM

## 2020-07-04 DIAGNOSIS — E7800 Pure hypercholesterolemia, unspecified: Secondary | ICD-10-CM

## 2020-07-04 DIAGNOSIS — F3342 Major depressive disorder, recurrent, in full remission: Secondary | ICD-10-CM

## 2020-07-04 MED ORDER — PERPHENAZINE 4 MG PO TABS: ORAL_TABLET | ORAL | 3 refills | Status: DC

## 2020-07-04 MED ORDER — NORTRIPTYLINE HCL 50 MG PO CAPS
50.0000 mg | ORAL_CAPSULE | Freq: Every day | ORAL | 3 refills | Status: DC
Start: 2020-07-04 — End: 2021-08-23

## 2020-07-04 NOTE — Progress Notes (Signed)
Jessica Little [id number] 1945-09-02 75 y.o.  Subjective:   Patient ID:  Jessica Little is a 75 y.o. (DOB [birth year]) female.  Chief Complaint:  Chief Complaint  Patient presents with  . Follow-up  . Schizoaffective disorder, depressive type [facility])    HPI Jessica Little presents to the office today for follow-up of depression with psychotic features.  Hosp April 2019 for 4 weeks after hernia surgery and then had agitated delirium.  Per Dr. Jamse Mead psychiatry at [facility] she also had a prolonged QTC of 593.  She was discharged from the hospital on nortriptyline 50 and perphenazine 4 mg nightly and lorazepam 1 mg twice daily .   then rehab.  A lot of med changes. Came off lorazepam in the hospital but doesn't remember it.  No psychosis off the meds.  Weaned off perphenazine too and on less nortriptyline and doing ok.  seen January 2020 and 06/22/19.  No meds were changed.  Has remained on nortriptyline 50 HS and perphenazine 4 HS. She was doing well at the time.  07/04/2020 appointment with the following noted: Sister came to see her in August and thinking about moving back to [place] to be with sisters and brother.  Maybe in the summer.    Doing great.  God's healed me of panic.  No depression.   Patient reports stable mood and denies depressed or irritable moods.  Patient denies any recent difficulty with anxiety.  Patient reports variable difficulty with sleep initiation or maintenance. No trazodone anymore   Denies appetite disturbance.  Patient reports that energy and motivation have been good.  Patient denies any difficulty with concentration.  Patient denies any suicidal ideation.  Past Psychiatric Medication Trials: Nortriptyline 100,  perphenazine 16 mg daily but mostly 8 mg over the last 10 years,  Geodon 160, olanzapine 20, quetiapine 300, Haldol 15 lorazepam 1 mg 4 times daily, atenolol,  Under the care of [facility] since July 1996  Review of Systems:   Review of Systems  Respiratory: Negative for chest tightness.   Gastrointestinal: Negative for abdominal distention.  Neurological: Positive for tremors. Negative for weakness.  Psychiatric/Behavioral: Negative for agitation, behavioral problems, confusion, decreased concentration, dysphoric mood, hallucinations, self-injury, sleep disturbance and suicidal ideas. The patient is nervous/anxious. The patient is not hyperactive.     Medications: I have reviewed the patient's current medications.  Current Outpatient Medications  Medication Sig Dispense Refill  . atenolol (TENORMIN) 50 MG tablet Take 1 tablet (50 mg total) by mouth daily. 90 tablet 1  . calcium carbonate (OS-CAL) 600 MG TABS tablet Take 600 mg by mouth daily with breakfast.    . Cholecalciferol (VITAMIN D3 PO) Take by mouth daily at 6 (six) AM.    . omeprazole (PRILOSEC) 40 MG capsule Take 1 capsule (40 mg total) by mouth 2 (two) times daily. 180 capsule 1  . potassium chloride (KLOR-CON) 10 MEQ tablet Take 1 tablet (10 mEq total) by mouth daily. 90 tablet 1  . Pumpkin Seed-Soy Germ (AZO BLADDER CONTROL/GO-LESS) CAPS Take by mouth daily.     . simvastatin (ZOCOR) 40 MG tablet Take 1 tablet (40 mg total) by mouth at bedtime. 90 tablet 1  . vitamin E 1000 UNIT capsule Take 1,000 Units by mouth daily.    Marland Kitchen Apple Cid Vn-Grn Tea-Bit Or-Cr (APPLE CIDER VINEGAR PLUS PO) Take by mouth. (Patient not taking: Reported on 07/04/2020)    . nortriptyline (PAMELOR) 50 MG capsule Take 1 capsule (50 mg  total) by mouth at bedtime. 90 capsule 3  . perphenazine (TRILAFON) 4 MG tablet TAKE 1 TABLET BY MOUTH AT BEDTIME 90 tablet 3   No current facility-administered medications for this visit.    Medication Side Effects: none  Allergies: No Known Allergies  Past Medical History:  Diagnosis Date  . Anxiety   . GERD (gastroesophageal reflux disease)   . Hyperlipidemia   . Hypertension     Family History  Problem Relation Age of Onset  .  Arrhythmia Mother   . Heart attack Father   . Stroke Father   . Healthy Sister   . Healthy Brother   . Healthy Sister   . Breast cancer Neg Hx     Social History   Socioeconomic History  . Marital status: Widowed    Spouse name: Not on file  . Number of children: 0  . Years of education: College  . Highest education level: Bachelor's degree (e.g., BA, AB, BS)  Occupational History  . Occupation: Retired  Tobacco Use  . Smoking status: Never Smoker  . Smokeless tobacco: Never Used  Vaping Use  . Vaping Use: Never used  Substance and Sexual Activity  . Alcohol use: No  . Drug use: No  . Sexual activity: Not on file  Other Topics Concern  . Not on file  Social History Narrative  . Not on file   Social Determinants of Health   Financial Resource Strain: Low Risk   . Difficulty of Paying Living Expenses: Not hard at all  Food Insecurity: No Food Insecurity  . Worried About Programme researcher, broadcasting/film/video in the Last Year: Never true  . Ran Out of Food in the Last Year: Never true  Transportation Needs: No Transportation Needs  . Lack of Transportation (Medical): No  . Lack of Transportation (Non-Medical): No  Physical Activity: Insufficiently Active  . Days of Exercise per Week: 2 days  . Minutes of Exercise per Session: 40 min  Stress: No Stress Concern Present  . Feeling of Stress : Not at all  Social Connections: Moderately Isolated  . Frequency of Communication with Friends and Family: More than three times a week  . Frequency of Social Gatherings with Friends and Family: More than three times a week  . Attends Religious Services: More than 4 times per year  . Active Member of Clubs or Organizations: No  . Attends Banker Meetings: Never  . Marital Status: Widowed  Intimate Partner Violence: Not At Risk  . Fear of Current or Ex-Partner: No  . Emotionally Abused: No  . Physically Abused: No  . Sexually Abused: No    Past Medical History, Surgical history,  Social history, and Family history were reviewed and updated as appropriate.   Please see review of systems for further details on the patient's review from today.   Objective:   Physical Exam:  There were no vitals taken for this visit.  Physical Exam Constitutional:      General: She is not in acute distress.    Appearance: She is well-developed.  Musculoskeletal:        General: No deformity.  Neurological:     Mental Status: She is alert and oriented to person, place, and time.     Cranial Nerves: Dysarthria present.     Motor: No tremor.     Coordination: Coordination normal.     Gait: Gait normal.     Comments: Slight dysarthria chronically.  Psychiatric:  Attention and Perception: She is attentive. She does not perceive auditory hallucinations.        Mood and Affect: Mood is not anxious or depressed. Affect is not labile, blunt, angry or inappropriate.        Speech: Speech normal.        Behavior: Behavior normal.        Thought Content: Thought content normal. Thought content is not paranoid. Thought content does not include homicidal or suicidal ideation. Thought content does not include homicidal or suicidal plan.        Cognition and Memory: Cognition normal.        Judgment: Judgment normal.     Comments: Insight fair. No auditory or visual hallucinations. No delusions.  Sx well controlled. Very happy.     Lab Review:     Component Value Date/Time   NA 141 05/23/2020 1434   NA 144 04/30/2014 1321   K 4.0 05/23/2020 1434   K 3.3 (L) 04/30/2014 1321   CL 102 05/23/2020 1434   CL 102 04/30/2014 1321   CO2 26 05/23/2020 1434   CO2 28 04/30/2014 1321   GLUCOSE 84 05/23/2020 1434   GLUCOSE 187 (H) 09/05/2017 2242   GLUCOSE 120 (H) 04/30/2014 1321   BUN 17 05/23/2020 1434   BUN 37 (H) 04/30/2014 1321   CREATININE 1.34 (H) 05/23/2020 1434   CREATININE 1.75 (H) 04/30/2014 1321   CALCIUM 9.4 05/23/2020 1434   CALCIUM 8.8 04/30/2014 1321   PROT 6.9  05/23/2020 1434   PROT 7.6 04/30/2014 1321   ALBUMIN 4.3 05/23/2020 1434   ALBUMIN 3.9 04/30/2014 1321   AST 18 05/23/2020 1434   AST 32 04/30/2014 1321   ALT 13 05/23/2020 1434   ALT 27 04/30/2014 1321   ALKPHOS 89 05/23/2020 1434   ALKPHOS 73 04/30/2014 1321   BILITOT 0.4 05/23/2020 1434   BILITOT 0.6 04/30/2014 1321   GFRNONAA 40 (L) 05/23/2020 1434   GFRNONAA 31 (L) 04/30/2014 1321   GFRNONAA 26 (L) 02/01/2014 1705   GFRAA 46 (L) 05/23/2020 1434   GFRAA 37 (L) 04/30/2014 1321   GFRAA 30 (L) 02/01/2014 1705       Component Value Date/Time   WBC 7.1 05/23/2020 1434   WBC 11.1 (H) 09/05/2017 2242   RBC 4.92 05/23/2020 1434   RBC 5.29 (H) 09/05/2017 2242   HGB 13.1 05/23/2020 1434   HCT 39.8 05/23/2020 1434   PLT 236 05/23/2020 1434   MCV 81 05/23/2020 1434   MCV 81 04/30/2014 1340   MCH 26.6 05/23/2020 1434   MCH 27.2 09/05/2017 2242   MCHC 32.9 05/23/2020 1434   MCHC 34.0 09/05/2017 2242   RDW 15.1 05/23/2020 1434   RDW 16.7 (H) 04/30/2014 1340   LYMPHSABS 2.5 05/23/2020 1434   LYMPHSABS 1.8 04/30/2014 1340   MONOABS 1.3 (H) 04/30/2014 1340   EOSABS 0.1 05/23/2020 1434   EOSABS 0.0 04/30/2014 1340   BASOSABS 0.1 05/23/2020 1434   BASOSABS 0.1 04/30/2014 1340    No results found for: POCLITH, LITHIUM   No results found for: PHENYTOIN, PHENOBARB, VALPROATE, CBMZ   .res Assessment: Plan:    Schizoaffective disorder, depressive type (HCC) - Plan: nortriptyline (PAMELOR) 50 MG capsule, perphenazine (TRILAFON) 4 MG tablet  Major depression, recurrent, full remission (HCC) - Plan: nortriptyline (PAMELOR) 50 MG capsule  Panic disorder with agoraphobia - Plan: nortriptyline (PAMELOR) 50 MG capsule  Generalized anxiety disorder - Plan: nortriptyline (PAMELOR) 50 MG capsule   Patient has a  history of schizoaffective disorder generalized anxiety disorder and panic disorder and had been stable for many years.  She had a medical hospitalization for surgery and  developed psychotic delirium in April 2019.  During that time in her recovery her nortriptyline dose was reduced by 50% to 50 mg daily, her perphenazine was discontinued and she was weaned off lorazepam.  She had no occasions of relapse of psychotic symptoms since being off the perphenazine so we will leave her off that.  Her depression has been stable with a lower dose no of nortriptyline so we will would not renew that.  Her panic disorder and has not recurred so we will not restart any benzodiazepine.  She will let us know if she has any recurrence of symptoms.  Doing well with only perphenazine 4 HS Nortriptyline 50 HS. Off lorazepam for a couple of years  No med changes indicated. High relapse risk without it.  Discussed potential metabolic side effects associated with atypical antipsychotics, as well as potential risk for movement side effects. Advised pt to contact office if movement side effects occur.   Call if any relapses.   Follow-up 12 months  Meredith Staggers, MD, DFAPA  Please see After Visit Summary for patient specific instructions.  No future appointments.  No orders of the defined types were placed in this encounter.     -------------------------------

## 2020-07-29 ENCOUNTER — Ambulatory Visit: Payer: Self-pay

## 2020-07-29 NOTE — Telephone Encounter (Signed)
Likely viral, however I recommend increased fluids and electrolyte such as low sugar Gatorade or Pedialyte. If it is a virus best to run its course and not take anti- diarrheal like imodium per package nstructions. As taking this can prolong viral illness.The 'BRAT' diet is suggested, then progress to diet as tolerated as symptoms abate. Call if bloody stools, persistent diarrhea, vomiting, fever or abdominal pain.  Since it is Friday afternoon and office closing if her symptoms worsen she should be seen immediately over the weekend.

## 2020-07-29 NOTE — Telephone Encounter (Signed)
Please review. What OTC can pt take for diarrhea? Thanks!

## 2020-07-29 NOTE — Telephone Encounter (Signed)
Patient has been advised. KW 

## 2020-07-29 NOTE — Telephone Encounter (Signed)
Pt called stating that last week she had diarrhea last Saturday, Sunday, and Monday. She states that it came back last night and is requesting to have some advice of some OTC medications she can take to help. Please advise. Reviewed home remedies with pt. Staying hydrated. No fever. Would like further advise from PCP.Declines virtual visit. Reason for Disposition  MILD-MODERATE diarrhea (e.g., 1-6 times / day more than normal)  Answer Assessment - Initial Assessment Questions 1. DIARRHEA SEVERITY: "How bad is the diarrhea?" "How many extra stools have you had in the past 24 hours than normal?"    - NO DIARRHEA (SCALE 0)   - MILD (SCALE 1-3): Few loose or mushy BMs; increase of 1-3 stools over normal daily number of stools; mild increase in ostomy output.   -  MODERATE (SCALE 4-7): Increase of 4-6 stools daily over normal; moderate increase in ostomy output. * SEVERE (SCALE 8-10; OR 'WORST POSSIBLE'): Increase of 7 or more stools daily over normal; moderate increase in ostomy output; incontinence.     3 2. ONSET: "When did the diarrhea begin?"      Saturday 3. BM CONSISTENCY: "How loose or watery is the diarrhea?"      Watery and loose 4. VOMITING: "Are you also vomiting?" If Yes, ask: "How many times in the past 24 hours?"      No 5. ABDOMINAL PAIN: "Are you having any abdominal pain?" If Yes, ask: "What does it feel like?" (e.g., crampy, dull, intermittent, constant)      Cramping 6. ABDOMINAL PAIN SEVERITY: If present, ask: "How bad is the pain?"  (e.g., Scale 1-10; mild, moderate, or severe)   - MILD (1-3): doesn't interfere with normal activities, abdomen soft and not tender to touch    - MODERATE (4-7): interferes with normal activities or awakens from sleep, tender to touch    - SEVERE (8-10): excruciating pain, doubled over, unable to do any normal activities       Mild 7. ORAL INTAKE: If vomiting, "Have you been able to drink liquids?" "How much fluids have you had in the past 24  hours?"     Yes 8. HYDRATION: "Any signs of dehydration?" (e.g., dry mouth [not just dry lips], too weak to stand, dizziness, new weight loss) "When did you last urinate?"     No 9. EXPOSURE: "Have you traveled to a foreign country recently?" "Have you been exposed to anyone with diarrhea?" "Could you have eaten any food that was spoiled?"     No 10. ANTIBIOTIC USE: "Are you taking antibiotics now or have you taken antibiotics in the past 2 months?"       No 11. OTHER SYMPTOMS: "Do you have any other symptoms?" (e.g., fever, blood in stool)       No 12. PREGNANCY: "Is there any chance you are pregnant?" "When was your last menstrual period?"       No  Protocols used: DIARRHEA-A-AH

## 2020-08-10 ENCOUNTER — Ambulatory Visit: Payer: Self-pay | Admitting: *Deleted

## 2020-08-10 DIAGNOSIS — H524 Presbyopia: Secondary | ICD-10-CM | POA: Diagnosis not present

## 2020-08-10 DIAGNOSIS — H2513 Age-related nuclear cataract, bilateral: Secondary | ICD-10-CM | POA: Diagnosis not present

## 2020-08-10 NOTE — Telephone Encounter (Signed)
Patient is calling to report her BP- 152/89- this morning, 168/84- this afternoon. Patient reports no other symptoms. Appointment made for evaluation of BP and possible adjustment to medication.  Reason for Disposition . Systolic BP  >= 160 OR Diastolic >= 100  Answer Assessment - Initial Assessment Questions 1. BLOOD PRESSURE: "What is the blood pressure?" "Did you take at least two measurements 5 minutes apart?"     168/84, 176/89 P 68 2. ONSET: "When did you take your blood pressure?"     3:30, 4:10 3. HOW: "How did you obtain the blood pressure?" (e.g., visiting nurse, automatic home BP monitor)     Automatic cuff 4. HISTORY: "Do you have a history of high blood pressure?"     yes 5. MEDICATIONS: "Are you taking any medications for blood pressure?" "Have you missed any doses recently?"     Yes- no missed doses 6. OTHER SYMPTOMS: "Do you have any symptoms?" (e.g., headache, chest pain, blurred vision, difficulty breathing, weakness)     No other symptoms 7. PREGNANCY: "Is there any chance you are pregnant?" "When was your last menstrual period?"     n/a  Protocols used: BLOOD PRESSURE - HIGH-A-AH

## 2020-08-11 ENCOUNTER — Other Ambulatory Visit: Payer: Self-pay

## 2020-08-11 ENCOUNTER — Ambulatory Visit (INDEPENDENT_AMBULATORY_CARE_PROVIDER_SITE_OTHER): Payer: Medicare Other | Admitting: Adult Health

## 2020-08-11 ENCOUNTER — Encounter: Payer: Self-pay | Admitting: Adult Health

## 2020-08-11 VITALS — BP 168/94 | HR 70 | Temp 98.6°F | Resp 16 | Wt 172.2 lb

## 2020-08-11 DIAGNOSIS — I10 Essential (primary) hypertension: Secondary | ICD-10-CM | POA: Diagnosis not present

## 2020-08-11 MED ORDER — LISINOPRIL 2.5 MG PO TABS: 2.5000 mg | ORAL_TABLET | Freq: Every day | ORAL | 0 refills | Status: DC

## 2020-08-11 NOTE — Progress Notes (Signed)
Established patient visit   Patient: Jessica BurrowRhoda M Little   DOB: 03/03/1946   75 y.o. Female  MRN: [id number] Visit Date: 08/11/2020  Today's healthcare provider: Jairo BenMichelle Smith Alianna Wurster, FNP   Chief Complaint  Patient presents with  . Hypertension   Subjective    HPI  Hypertension, follow-up  BP Readings from Last 3 Encounters:  08/11/20 (!) 168/94  05/23/20 (!) 157/93  02/25/19 (!) 150/88   Wt Readings from Last 3 Encounters:  08/11/20 172 lb 3.2 oz (78.1 kg)  05/23/20 175 lb 9.6 oz (79.7 kg)  02/25/19 161 lb 3.2 oz (73.1 kg)     She was last seen for hypertension 3 months ago.  BP at that visit was 157/93. Management since that visit includes none patient to continue Atenolol.  She reports excellent compliance with treatment. She is not having side effects.  She is following a Regular diet. She is exercising. She does not smoke.  Use of agents associated with hypertension: none.   Outside blood pressures are not being checked regularly. Yesterday patient reports blood pressure reading in the morning of 152/89 and in the 184/84 Patient brought blood pressure cuff from home to compare reading in office her blood pressure read at 190/107 Symptoms: No chest pain No chest pressure  No palpitations No syncope  No dyspnea No orthopnea  No paroxysmal nocturnal dyspnea No lower extremity edema   Pertinent labs: Lab Results  Component Value Date   CHOL 192 05/23/2020   HDL 57 05/23/2020   LDLCALC 107 (H) 05/23/2020   TRIG 160 (H) 05/23/2020   CHOLHDL 3.7 02/25/2019   Lab Results  Component Value Date   NA 141 05/23/2020   K 4.0 05/23/2020   CREATININE 1.34 (H) 05/23/2020   GFRNONAA 40 (L) 05/23/2020   GFRAA 46 (L) 05/23/2020   GLUCOSE 84 05/23/2020     The 10-year ASCVD risk score Denman George(Goff DC Jr., et al., 2013) is: 25%   ---------------------------------------------------------------------------------------------------  Patient Active Problem List    Diagnosis Date Noted  . Gastroesophageal reflux disease with esophagitis without hemorrhage 05/23/2020  . Hypokalemia 05/23/2020  . GAD (generalized anxiety disorder) 05/25/2018  . Osteopenia 10/10/2016  . H/O urinary frequency 07/06/2015  . Anxiety 02/03/2015  . Schizoaffective disorder, depressive type (HCC) 02/03/2015  . Acid reflux 02/03/2015  . HLD (hyperlipidemia) 02/03/2015  . BP (high blood pressure) 02/03/2015  . Cannot sleep 02/03/2015  . Dislocated shoulder 02/03/2015  . Thyroid nodule 02/03/2015   Past Medical History:  Diagnosis Date  . Anxiety   . GERD (gastroesophageal reflux disease)   . Hyperlipidemia   . Hypertension    No Known Allergies     Medications: Outpatient Medications Prior to Visit  Medication Sig  . atenolol (TENORMIN) 50 MG tablet Take 1 tablet (50 mg total) by mouth daily.  . calcium carbonate (OS-CAL) 600 MG TABS tablet Take 600 mg by mouth daily with breakfast.  . Cholecalciferol (VITAMIN D3 PO) Take by mouth daily at 6 (six) AM.  . nortriptyline (PAMELOR) 50 MG capsule Take 1 capsule (50 mg total) by mouth at bedtime.  Marland Kitchen. omeprazole (PRILOSEC) 40 MG capsule Take 1 capsule (40 mg total) by mouth 2 (two) times daily.  Marland Kitchen. perphenazine (TRILAFON) 4 MG tablet TAKE 1 TABLET BY MOUTH AT BEDTIME  . potassium chloride (KLOR-CON) 10 MEQ tablet Take 1 tablet (10 mEq total) by mouth daily.  . Pumpkin Seed-Soy Germ (AZO BLADDER CONTROL/GO-LESS) CAPS Take by mouth daily.   .Marland Kitchen  simvastatin (ZOCOR) 40 MG tablet Take 1 tablet (40 mg total) by mouth at bedtime.  . vitamin E 1000 UNIT capsule Take 1,000 Units by mouth daily.  Marland Kitchen Apple Cid Vn-Grn Tea-Bit Or-Cr (APPLE CIDER VINEGAR PLUS PO) Take by mouth. (Patient not taking: No sig reported)   No facility-administered medications prior to visit.    Review of Systems  Constitutional: Negative.   HENT: Negative.   Eyes: Negative.   Cardiovascular: Negative.   Gastrointestinal: Negative.   Musculoskeletal:  Negative.   Neurological: Negative.   Psychiatric/Behavioral: Negative.         Objective    BP (!) 168/94   Pulse 70   Temp 98.6 F (37 C) (Oral)   Resp 16   Wt 172 lb 3.2 oz (78.1 kg)   SpO2 100%   BMI 27.79 kg/m  BP Readings from Last 3 Encounters:  08/11/20 (!) 168/94  05/23/20 (!) 157/93  02/25/19 (!) 150/88   Wt Readings from Last 3 Encounters:  08/11/20 172 lb 3.2 oz (78.1 kg)  05/23/20 175 lb 9.6 oz (79.7 kg)  02/25/19 161 lb 3.2 oz (73.1 kg)       Physical Exam Vitals reviewed.  Constitutional:      General: She is not in acute distress.    Appearance: She is well-developed. She is not diaphoretic.     Interventions: She is not intubated. HENT:     Head: Normocephalic and atraumatic.     Right Ear: External ear normal.     Left Ear: External ear normal.     Nose: Nose normal.     Mouth/Throat:     Pharynx: No oropharyngeal exudate.  Eyes:     General: Lids are normal. No scleral icterus.       Right eye: No discharge.        Left eye: No discharge.     Conjunctiva/sclera: Conjunctivae normal.     Right eye: Right conjunctiva is not injected. No exudate or hemorrhage.    Left eye: Left conjunctiva is not injected. No exudate or hemorrhage.    Pupils: Pupils are equal, round, and reactive to light.  Neck:     Thyroid: No thyroid mass or thyromegaly.     Vascular: Normal carotid pulses. No carotid bruit, hepatojugular reflux or JVD.     Trachea: Trachea and phonation normal. No tracheal tenderness or tracheal deviation.     Meningeal: Brudzinski's sign and Kernig's sign absent.  Cardiovascular:     Rate and Rhythm: Normal rate and regular rhythm.     Pulses: Normal pulses.          Radial pulses are 2+ on the right side and 2+ on the left side.       Dorsalis pedis pulses are 2+ on the right side and 2+ on the left side.       Posterior tibial pulses are 2+ on the right side and 2+ on the left side.     Heart sounds: Normal heart sounds, S1 normal  and S2 normal. Heart sounds not distant. No murmur heard. No friction rub. No gallop.   Pulmonary:     Effort: Pulmonary effort is normal. No tachypnea, bradypnea, accessory muscle usage or respiratory distress. She is not intubated.     Breath sounds: Normal breath sounds. No stridor. No wheezing or rales.  Chest:     Chest wall: No tenderness.  Breasts:     Right: No supraclavicular adenopathy.     Left: No  supraclavicular adenopathy.    Abdominal:     General: Bowel sounds are normal. There is no distension or abdominal bruit.     Palpations: Abdomen is soft. There is no shifting dullness, fluid wave, hepatomegaly, splenomegaly, mass or pulsatile mass.     Tenderness: There is no abdominal tenderness. There is no guarding or rebound.     Hernia: No hernia is present.  Musculoskeletal:        General: No tenderness or deformity. Normal range of motion.     Cervical back: Full passive range of motion without pain, normal range of motion and neck supple. No edema, erythema or rigidity. No spinous process tenderness or muscular tenderness. Normal range of motion.  Lymphadenopathy:     Head:     Right side of head: No submental, submandibular, tonsillar, preauricular, posterior auricular or occipital adenopathy.     Left side of head: No submental, submandibular, tonsillar, preauricular, posterior auricular or occipital adenopathy.     Cervical: No cervical adenopathy.     Right cervical: No superficial, deep or posterior cervical adenopathy.    Left cervical: No superficial, deep or posterior cervical adenopathy.     Upper Body:     Right upper body: No supraclavicular or pectoral adenopathy.     Left upper body: No supraclavicular or pectoral adenopathy.  Skin:    General: Skin is warm and dry.     Coloration: Skin is not pale.     Findings: No abrasion, bruising, burn, ecchymosis, erythema, lesion, petechiae or rash.     Nails: There is no clubbing.  Neurological:     Mental  Status: She is alert and oriented to person, place, and time.     GCS: GCS eye subscore is 4. GCS verbal subscore is 5. GCS motor subscore is 6.     Cranial Nerves: No cranial nerve deficit.     Sensory: No sensory deficit.     Motor: No tremor, atrophy, abnormal muscle tone or seizure activity.     Coordination: Coordination normal.     Gait: Gait normal.     Deep Tendon Reflexes: Reflexes are normal and symmetric. Reflexes normal. Babinski sign absent on the right side. Babinski sign absent on the left side.     Reflex Scores:      Tricep reflexes are 2+ on the right side and 2+ on the left side.      Bicep reflexes are 2+ on the right side and 2+ on the left side.      Brachioradialis reflexes are 2+ on the right side and 2+ on the left side.      Patellar reflexes are 2+ on the right side and 2+ on the left side.      Achilles reflexes are 2+ on the right side and 2+ on the left side. Psychiatric:        Speech: Speech normal.        Behavior: Behavior normal.        Thought Content: Thought content normal.        Judgment: Judgment normal.       No results found for any visits on 08/11/20.  Assessment & Plan     Primary hypertension - Plan: lisinopril (ZESTRIL) 2.5 MG tablet, Comprehensive metabolic panel   Will add medication as above. Diet and exercise/lifestyle changes.   Red Flags discussed. The patient was given clear instructions to go to ER or return to medical center if any red flags develop, symptoms  do not improve, worsen or new problems develop. They verbalized understanding.   Return in about 3 weeks (around 09/01/2020), or if symptoms worsen or fail to improve, for at any time for any worsening symptoms, Go to Emergency room/ urgent care if worse.      The entirety of the information documented in the History of Present Illness, Review of Systems and Physical Exam were personally obtained by me. Portions of this information were initially documented by the CMA  and reviewed by me for thoroughness and accuracy.     Jairo Ben, FNP  [facility] [phone] (phone) [phone] (fax)  [facility]

## 2020-08-11 NOTE — Patient Instructions (Signed)
Managing Your Hypertension Hypertension, also called high blood pressure, is when the force of the blood pressing against the walls of the arteries is too strong. Arteries are blood vessels that carry blood from your heart throughout your body. Hypertension forces the heart to work harder to pump blood and may cause the arteries to become narrow or stiff. Understanding blood pressure readings Your personal target blood pressure may vary depending on your medical conditions, your age, and other factors. A blood pressure reading includes a higher number over a lower number. Ideally, your blood pressure should be below 120/80. You should know that:  The first, or top, number is called the systolic pressure. It is a measure of the pressure in your arteries as your heart beats.  The second, or bottom number, is called the diastolic pressure. It is a measure of the pressure in your arteries as the heart relaxes. Blood pressure is classified into four stages. Based on your blood pressure reading, your health care provider may use the following stages to determine what type of treatment you need, if any. Systolic pressure and diastolic pressure are measured in a unit called mmHg. Normal  Systolic pressure: below 120.  Diastolic pressure: below 80. Elevated  Systolic pressure: 120-129.  Diastolic pressure: below 80. Hypertension stage 1  Systolic pressure: 130-139.  Diastolic pressure: 80-89. Hypertension stage 2  Systolic pressure: 140 or above.  Diastolic pressure: 90 or above. How can this condition affect me? Managing your hypertension is an important responsibility. Over time, hypertension can damage the arteries and decrease blood flow to important parts of the body, including the brain, heart, and kidneys. Having untreated or uncontrolled hypertension can lead to:  A heart attack.  A stroke.  A weakened blood vessel (aneurysm).  Heart failure.  Kidney damage.  Eye  damage.  Metabolic syndrome.  Memory and concentration problems.  Vascular dementia. What actions can I take to manage this condition? Hypertension can be managed by making lifestyle changes and possibly by taking medicines. Your health care provider will help you make a plan to bring your blood pressure within a normal range. Nutrition  Eat a diet that is high in fiber and potassium, and low in salt (sodium), added sugar, and fat. An example eating plan is called the Dietary Approaches to Stop Hypertension (DASH) diet. To eat this way: ? Eat plenty of fresh fruits and vegetables. Try to fill one-half of your plate at each meal with fruits and vegetables. ? Eat whole grains, such as whole-wheat pasta, brown rice, or whole-grain bread. Fill about one-fourth of your plate with whole grains. ? Eat low-fat dairy products. ? Avoid fatty cuts of meat, processed or cured meats, and poultry with skin. Fill about one-fourth of your plate with lean proteins such as fish, chicken without skin, beans, eggs, and tofu. ? Avoid pre-made and processed foods. These tend to be higher in sodium, added sugar, and fat.  Reduce your daily sodium intake. Most people with hypertension should eat less than 1,500 mg of sodium a day.   Lifestyle  Work with your health care provider to maintain a healthy body weight or to lose weight. Ask what an ideal weight is for you.  Get at least 30 minutes of exercise that causes your heart to beat faster (aerobic exercise) most days of the week. Activities may include walking, swimming, or biking.  Include exercise to strengthen your muscles (resistance exercise), such as weight lifting, as part of your weekly exercise routine. Try   to do these types of exercises for 30 minutes at least 3 days a week.  Do not use any products that contain nicotine or tobacco, such as cigarettes, e-cigarettes, and chewing tobacco. If you need help quitting, ask your health care  provider.  Control any long-term (chronic) conditions you have, such as high cholesterol or diabetes.  Identify your sources of stress and find ways to manage stress. This may include meditation, deep breathing, or making time for fun activities.   Alcohol use  Do not drink alcohol if: ? Your health care provider tells you not to drink. ? You are pregnant, may be pregnant, or are planning to become pregnant.  If you drink alcohol: ? Limit how much you use to:  0-1 drink a day for women.  0-2 drinks a day for men. ? Be aware of how much alcohol is in your drink. In the [place] one drink equals one 12 oz bottle of beer (355 mL), one 5 oz glass of wine (148 mL), or one 1 oz glass of hard liquor (44 mL). Medicines Your health care provider may prescribe medicine if lifestyle changes are not enough to get your blood pressure under control and if:  Your systolic blood pressure is 130 or higher.  Your diastolic blood pressure is 80 or higher. Take medicines only as told by your health care provider. Follow the directions carefully. Blood pressure medicines must be taken as told by your health care provider. The medicine does not work as well when you skip doses. Skipping doses also puts you at risk for problems. Monitoring Before you monitor your blood pressure:  Do not smoke, drink caffeinated beverages, or exercise within 30 minutes before taking a measurement.  Use the bathroom and empty your bladder (urinate).  Sit quietly for at least 5 minutes before taking measurements. Monitor your blood pressure at home as told by your health care provider. To do this:  Sit with your back straight and supported.  Place your feet flat on the floor. Do not cross your legs.  Support your arm on a flat surface, such as a table. Make sure your upper arm is at heart level.  Each time you measure, take two or three readings one minute apart and record the results. You may also need to have your  blood pressure checked regularly by your health care provider.   General information  Talk with your health care provider about your diet, exercise habits, and other lifestyle factors that may be contributing to hypertension.  Review all the medicines you take with your health care provider because there may be side effects or interactions.  Keep all visits as told by your health care provider. Your health care provider can help you create and adjust your plan for managing your high blood pressure. Where to find more information  National Heart, Lung, and Blood Institute: www.nhlbi.nih.gov  American Heart Association: www.heart.org Contact a health care provider if:  You think you are having a reaction to medicines you have taken.  You have repeated (recurrent) headaches.  You feel dizzy.  You have swelling in your ankles.  You have trouble with your vision. Get help right away if:  You develop a severe headache or confusion.  You have unusual weakness or numbness, or you feel faint.  You have severe pain in your chest or abdomen.  You vomit repeatedly.  You have trouble breathing. These symptoms may represent a serious problem that is an emergency. Do not wait   to see if the symptoms will go away. Get medical help right away. Call your local emergency services (911 in the [place] Do not drive yourself to the hospital. Summary  Hypertension is when the force of blood pumping through your arteries is too strong. If this condition is not controlled, it may put you at risk for serious complications.  Your personal target blood pressure may vary depending on your medical conditions, your age, and other factors. For most people, a normal blood pressure is less than 120/80.  Hypertension is managed by lifestyle changes, medicines, or both.  Lifestyle changes to help manage hypertension include losing weight, eating a healthy, low-sodium diet, exercising more, stopping smoking, and  limiting alcohol. This information is not intended to replace advice given to you by your health care provider. Make sure you discuss any questions you have with your health care provider. Document Revised: 06/19/2019 Document Reviewed: 04/14/2019 Elsevier Patient Education  2021 Elsevier Inc. Lisinopril Tablets What is this medicine? LISINOPRIL (lyse [place] oh pril) is an ACE inhibitor. It treats high blood pressure and heart failure. It can treat heart damage after a heart attack. This medicine may be used for other purposes; ask your health care provider or pharmacist if you have questions. COMMON BRAND NAME(S): Prinivil, Zestril What should I tell my health care provider before I take this medicine? They need to know if you have any of these conditions:  diabetes  heart or blood vessel disease  kidney disease  low blood pressure  previous swelling of the tongue, face, or lips with difficulty breathing, difficulty swallowing, hoarseness, or tightening of the throat  an unusual or allergic reaction to lisinopril, other ACE inhibitors, insect venom, foods, dyes, or preservatives  pregnant or trying to get pregnant  breast-feeding How should I use this medicine? Take this medicine by mouth. Take it as directed on the prescription label at the same time every day. You can take it with or without food. If it upsets your stomach, take it with food. Keep taking it unless your health care provider tells you to stop. Talk to your health care provider about the use of this medicine in children. While it may be prescribed for children as young as 6 for selected conditions, precautions do apply. Overdosage: If you think you have taken too much of this medicine contact a poison control center or emergency room at once. NOTE: This medicine is only for you. Do not share this medicine with others. What if I miss a dose? If you miss a dose, take it as soon as you can. If it is almost time for your  next dose, take only that dose. Do not take double or extra doses. What may interact with this medicine? Do not take this medicine with any of the following medications:  hymenoptera venom  sacubitril; valsartan This medicines may also interact with the following medications:  aliskiren  angiotensin receptor blockers, like losartan or valsartan  certain medicines for diabetes  diuretics  everolimus  gold compounds  lithium  NSAIDs, medicines for pain and inflammation, like ibuprofen or naproxen  potassium salts or supplements  salt substitutes  sirolimus  temsirolimus This list may not describe all possible interactions. Give your health care provider a list of all the medicines, herbs, non-prescription drugs, or dietary supplements you use. Also tell them if you smoke, drink alcohol, or use illegal drugs. Some items may interact with your medicine. What should I watch for while using this medicine? Visit  your health care provider for regular check ups. Check your blood pressure as directed. Ask your health care provider what your blood pressure should be. Also, find out when you should contact him or her. Do not treat yourself for coughs, colds, or pain while you are using this medicine without asking your health care provider for advice. Some medicines may increase your blood pressure. Inform your health care provider if you wish to become pregnant or think you might be pregnant. There is a potential for serious side effects to an unborn child. Talk to your health care provider for more information. You may get drowsy or dizzy. Do not drive, use machinery, or do anything that needs mental alertness until you know how this medicine affects you. Do not stand or sit up quickly, especially if you are an older patient. This reduces the risk of dizzy or fainting spells. Alcohol can make you more drowsy and dizzy. Avoid alcoholic drinks. Avoid salt substitutes unless you are told  otherwise by your health care provider. What side effects may I notice from receiving this medicine? Side effects that you should report to your doctor or health care professional as soon as possible:  allergic reactions (skin rash, itching or hives, swelling of the hands, feet, face, lips, throat, or tongue)  breathing problems  high potassium levels (chest pain; or fast, irregular heartbeat; muscle weakness)  kidney injury (trouble passing urine or change in the amount of urine)  liver injury (dark yellow or brown urine; general ill feeling or flu-like symptoms; light-colored stools; loss of appetite; right upper belly pain; unusually weak or tired; yellowing of the eyes or skin)  low blood pressure (dizziness; feeling faint or lightheaded, falls; unusually weak or tired) Side effects that usually do not require medical attention (report to your doctor or health care professional if they continue or are bothersome):  changes in taste  cough  dizziness  headache This list may not describe all possible side effects. Call your doctor for medical advice about side effects. You may report side effects to FDA at 1-800-FDA-1088. Where should I keep my medicine? Keep out of the reach of children and pets. Store at room temperature between 20 and 25 degrees C (68 and 77 degrees F). Protect from moisture. Keep the container tightly closed. Do not freeze. Avoid exposure to extreme heat. Get rid of any unused medicine after the expiration date. To get rid of medicines that are no longer needed or have expired:  Take the medicine to a medicine take-back program. Check with your pharmacy or law enforcement to find a location.  If you cannot return the medicine, check the label or package insert to see if the medicine should be thrown out in the garbage or flushed down the toilet. If you are not sure, ask your health care provider. If it is safe to put in the trash, empty the medicine out of the  container. Mix the medicine with cat litter, dirt, coffee grounds, or other unwanted substance. Seal the mixture in a bag or container. Put it in the trash. NOTE: This sheet is a summary. It may not cover all possible information. If you have questions about this medicine, talk to your doctor, pharmacist, or health care provider.  2021 Elsevier/Gold Standard (2020-04-09 14:57:12)

## 2020-08-31 NOTE — Progress Notes (Signed)
Established patient visit   Patient: Jessica Little   DOB: [birth year]   75 y.o. Female  MRN: [id number] Visit Date: 09/01/2020  Today's healthcare provider: Margaretann Loveless, PA-C   Chief Complaint  Patient presents with  . Hypertension   Subjective    HPI  Hypertension, follow-up  BP Readings from Last 3 Encounters:  09/01/20 (!) 148/90  08/11/20 (!) 168/94  05/23/20 (!) 157/93   Wt Readings from Last 3 Encounters:  09/01/20 171 lb 9.6 oz (77.8 kg)  08/11/20 172 lb 3.2 oz (78.1 kg)  05/23/20 175 lb 9.6 oz (79.7 kg)     She was last seen for hypertension 3 weeks ago.  BP at that visit was 168/94. Management since that visit includes added  lisinopril (ZESTRIL) 2.5 MG tablet.  She reports good compliance with treatment. She is not having side effects.  She is following a Regular diet. She is exercising. She does not smoke.  Use of agents associated with hypertension: none.   Outside blood pressures are checked at home and average 159/89. Symptoms: No chest pain No chest pressure  No palpitations No syncope  No dyspnea No orthopnea  No paroxysmal nocturnal dyspnea No lower extremity edema   Pertinent labs: Lab Results  Component Value Date   CHOL 192 05/23/2020   HDL 57 05/23/2020   LDLCALC 107 (H) 05/23/2020   TRIG 160 (H) 05/23/2020   CHOLHDL 3.7 02/25/2019   Lab Results  Component Value Date   NA 141 05/23/2020   K 4.0 05/23/2020   CREATININE 1.34 (H) 05/23/2020   GFRNONAA 40 (L) 05/23/2020   GFRAA 46 (L) 05/23/2020   GLUCOSE 84 05/23/2020     The 10-year ASCVD risk score Denman George DC Jr., et al., 2013) is: 20%   ---------------------------------------------------------------------------------------------------   Patient Active Problem List   Diagnosis Date Noted  . Gastroesophageal reflux disease with esophagitis without hemorrhage 05/23/2020  . Hypokalemia 05/23/2020  . GAD (generalized anxiety disorder) 05/25/2018  . Osteopenia  10/10/2016  . H/O urinary frequency 07/06/2015  . Anxiety 02/03/2015  . Schizoaffective disorder, depressive type (HCC) 02/03/2015  . Acid reflux 02/03/2015  . HLD (hyperlipidemia) 02/03/2015  . BP (high blood pressure) 02/03/2015  . Cannot sleep 02/03/2015  . Dislocated shoulder 02/03/2015  . Thyroid nodule 02/03/2015   Past Medical History:  Diagnosis Date  . Anxiety   . GERD (gastroesophageal reflux disease)   . Hyperlipidemia   . Hypertension        Medications: Outpatient Medications Prior to Visit  Medication Sig  . Apple Cid Vn-Grn Tea-Bit Or-Cr (APPLE CIDER VINEGAR PLUS PO) Take by mouth.  Marland Kitchen atenolol (TENORMIN) 50 MG tablet Take 1 tablet (50 mg total) by mouth daily.  . calcium carbonate (OS-CAL) 600 MG TABS tablet Take 600 mg by mouth daily with breakfast.  . Cholecalciferol (VITAMIN D3 PO) Take by mouth daily.  Marland Kitchen lisinopril (ZESTRIL) 2.5 MG tablet Take 1 tablet (2.5 mg total) by mouth daily.  . nortriptyline (PAMELOR) 50 MG capsule Take 1 capsule (50 mg total) by mouth at bedtime.  Marland Kitchen omeprazole (PRILOSEC) 40 MG capsule Take 1 capsule (40 mg total) by mouth 2 (two) times daily.  Marland Kitchen perphenazine (TRILAFON) 4 MG tablet TAKE 1 TABLET BY MOUTH AT BEDTIME  . potassium chloride (KLOR-CON) 10 MEQ tablet Take 1 tablet (10 mEq total) by mouth daily.  . Pumpkin Seed-Soy Germ (AZO BLADDER CONTROL/GO-LESS) CAPS Take by mouth daily.   . simvastatin (ZOCOR) 40  MG tablet Take 1 tablet (40 mg total) by mouth at bedtime.  . vitamin E 1000 UNIT capsule Take 1,000 Units by mouth daily.   No facility-administered medications prior to visit.    Review of Systems  Constitutional: Negative for appetite change, chills, fatigue and fever.  Respiratory: Negative for chest tightness and shortness of breath.   Cardiovascular: Negative for chest pain and palpitations.  Gastrointestinal: Positive for diarrhea (has occured intermittently for 3 months). Negative for nausea and vomiting.   Neurological: Negative for dizziness and weakness.    Last CBC Lab Results  Component Value Date   WBC 7.1 05/23/2020   HGB 13.1 05/23/2020   HCT 39.8 05/23/2020   MCV 81 05/23/2020   MCH 26.6 05/23/2020   RDW 15.1 05/23/2020   PLT 236 05/23/2020   Last metabolic panel Lab Results  Component Value Date   GLUCOSE 84 05/23/2020   NA 141 05/23/2020   K 4.0 05/23/2020   CL 102 05/23/2020   CO2 26 05/23/2020   BUN 17 05/23/2020   CREATININE 1.34 (H) 05/23/2020   GFRNONAA 40 (L) 05/23/2020   GFRAA 46 (L) 05/23/2020   CALCIUM 9.4 05/23/2020   PROT 6.9 05/23/2020   ALBUMIN 4.3 05/23/2020   LABGLOB 2.6 05/23/2020   AGRATIO 1.7 05/23/2020   BILITOT 0.4 05/23/2020   ALKPHOS 89 05/23/2020   AST 18 05/23/2020   ALT 13 05/23/2020   ANIONGAP 12 09/05/2017       Objective    BP (!) 148/90 (BP Location: Right Arm, Patient Position: Sitting, Cuff Size: Large)   Pulse (!) 58   Temp 98.2 F (36.8 C) (Temporal)   Resp 20   Wt 171 lb 9.6 oz (77.8 kg)   SpO2 99% Comment: room air  BMI 27.70 kg/m  BP Readings from Last 3 Encounters:  09/01/20 (!) 148/90  08/11/20 (!) 168/94  05/23/20 (!) 157/93   Wt Readings from Last 3 Encounters:  09/01/20 171 lb 9.6 oz (77.8 kg)  08/11/20 172 lb 3.2 oz (78.1 kg)  05/23/20 175 lb 9.6 oz (79.7 kg)       Physical Exam Vitals reviewed.  Constitutional:      General: She is not in acute distress.    Appearance: Normal appearance. She is well-developed. She is not ill-appearing or diaphoretic.  Cardiovascular:     Rate and Rhythm: Normal rate and regular rhythm.     Heart sounds: Normal heart sounds. No murmur heard. No friction rub. No gallop.   Pulmonary:     Effort: Pulmonary effort is normal. No respiratory distress.     Breath sounds: Normal breath sounds. No wheezing or rales.  Musculoskeletal:     Cervical back: Normal range of motion and neck supple.  Neurological:     Mental Status: She is alert.      No results found  for any visits on 09/01/20.  Assessment & Plan     1. Primary hypertension Improved but still elevated. Will increase lisinopril to 5mg . F/U in 3 months.   2. Diarrhea, unspecified type Issue has been present for a few weeks. Went to see a friend at nursing home and symptoms started next day. Feels she may have caught something there. Was worse, but now only occurs once or twice per week. When occurs has bloating and gas pains, that is relieved after BM. She will have multiple BM and takes pepto bismol that does help relieve symptoms. Will get GI PCR as below. Advised  to add Align probiotic. Will follow up results. Call if worsening.  - GI Profile, Stool, PCR   No follow-ups on file.      Delmer Islam, PA-C, have reviewed all documentation for this visit. The documentation on 09/01/20 for the exam, diagnosis, procedures, and orders are all accurate and complete.   Reine Just  [facility] [phone] (phone) [phone] (fax)  [facility]

## 2020-09-01 ENCOUNTER — Encounter: Payer: Self-pay | Admitting: Physician Assistant

## 2020-09-01 ENCOUNTER — Other Ambulatory Visit: Payer: Self-pay

## 2020-09-01 ENCOUNTER — Ambulatory Visit (INDEPENDENT_AMBULATORY_CARE_PROVIDER_SITE_OTHER): Payer: Medicare Other | Admitting: Physician Assistant

## 2020-09-01 VITALS — BP 148/90 | HR 58 | Temp 98.2°F | Resp 20 | Wt 171.6 lb

## 2020-09-01 DIAGNOSIS — R197 Diarrhea, unspecified: Secondary | ICD-10-CM | POA: Diagnosis not present

## 2020-09-01 DIAGNOSIS — I10 Essential (primary) hypertension: Secondary | ICD-10-CM

## 2020-09-01 MED ORDER — LISINOPRIL 2.5 MG PO TABS: 5.0000 mg | ORAL_TABLET | Freq: Every day | ORAL | 0 refills | Status: DC

## 2020-09-01 NOTE — Patient Instructions (Signed)
Align probiotic   Diarrhea, Adult Diarrhea is frequent loose and watery bowel movements. Diarrhea can make you feel weak and cause you to become dehydrated. Dehydration can make you tired and thirsty, cause you to have a dry mouth, and decrease how often you urinate. Diarrhea typically lasts 2-3 days. However, it can last longer if it is a sign of something more serious. It is important to treat your diarrhea as told by your health care provider. Follow these instructions at home: Eating and drinking Follow these recommendations as told by your health care provider:  Take an oral rehydration solution (ORS). This is an over-the-counter medicine that helps return your body to its normal balance of nutrients and water. It is found at pharmacies and retail stores.  Drink plenty of fluids, such as water, ice chips, diluted fruit juice, and low-calorie sports drinks. You can drink milk also, if desired.  Avoid drinking fluids that contain a lot of sugar or caffeine, such as energy drinks, sports drinks, and soda.  Eat bland, easy-to-digest foods in small amounts as you are able. These foods include bananas, applesauce, rice, lean meats, toast, and crackers.  Avoid alcohol.  Avoid spicy or fatty foods.      Medicines  Take over-the-counter and prescription medicines only as told by your health care provider.  If you were prescribed an antibiotic medicine, take it as told by your health care provider. Do not stop using the antibiotic even if you start to feel better. General instructions  Wash your hands often using soap and water. If soap and water are not available, use a hand sanitizer. Others in the household should wash their hands as well. Hands should be washed: ? After using the toilet or changing a diaper. ? Before preparing, cooking, or serving food. ? While caring for a sick person or while visiting someone in a hospital.  Drink enough fluid to keep your urine pale  yellow.  Rest at home while you recover.  Watch your condition for any changes.  Take a warm bath to relieve any burning or pain from frequent diarrhea episodes.  Keep all follow-up visits as told by your health care provider. This is important.   Contact a health care provider if:  You have a fever.  Your diarrhea gets worse.  You have new symptoms.  You cannot keep fluids down.  You feel light-headed or dizzy.  You have a headache.  You have muscle cramps. Get help right away if:  You have chest pain.  You feel extremely weak or you faint.  You have bloody or black stools or stools that look like tar.  You have severe pain, cramping, or bloating in your abdomen.  You have trouble breathing or you are breathing very quickly.  Your heart is beating very quickly.  Your skin feels cold and clammy.  You feel confused.  You have signs of dehydration, such as: ? Dark urine, very little urine, or no urine. ? Cracked lips. ? Dry mouth. ? Sunken eyes. ? Sleepiness. ? Weakness. Summary  Diarrhea is frequent loose and watery bowel movements. Diarrhea can make you feel weak and cause you to become dehydrated.  Drink enough fluids to keep your urine pale yellow.  Make sure that you wash your hands after using the toilet. If soap and water are not available, use hand sanitizer.  Contact a health care provider if your diarrhea gets worse or you have new symptoms.  Get help right away if you  have signs of dehydration. This information is not intended to replace advice given to you by your health care provider. Make sure you discuss any questions you have with your health care provider. Document Revised: 09/30/2018 Document Reviewed: 10/18/2017 Elsevier Patient Education  2021 [employer].

## 2020-09-02 ENCOUNTER — Telehealth: Payer: Self-pay

## 2020-09-02 DIAGNOSIS — R197 Diarrhea, unspecified: Secondary | ICD-10-CM | POA: Diagnosis not present

## 2020-09-02 LAB — COMPREHENSIVE METABOLIC PANEL
ALT: 19 IU/L (ref 0–32)
AST: 24 IU/L (ref 0–40)
Albumin/Globulin Ratio: 1.6 (ref 1.2–2.2)
Albumin: 4.4 g/dL (ref 3.7–4.7)
Alkaline Phosphatase: 78 IU/L (ref 44–121)
BUN/Creatinine Ratio: 15 (ref 12–28)
BUN: 22 mg/dL (ref 8–27)
Bilirubin Total: 0.5 mg/dL (ref 0.0–1.2)
CO2: 24 mmol/L (ref 20–29)
Calcium: 9.1 mg/dL (ref 8.7–10.3)
Chloride: 101 mmol/L (ref 96–106)
Creatinine, Ser: 1.47 mg/dL — ABNORMAL HIGH (ref 0.57–1.00)
Globulin, Total: 2.7 g/dL (ref 1.5–4.5)
Glucose: 101 mg/dL — ABNORMAL HIGH (ref 65–99)
Potassium: 4.3 mmol/L (ref 3.5–5.2)
Sodium: 141 mmol/L (ref 134–144)
Total Protein: 7.1 g/dL (ref 6.0–8.5)
eGFR: 38 mL/min/{1.73_m2} — ABNORMAL LOW (ref >59)

## 2020-09-02 NOTE — Telephone Encounter (Signed)
-----   Message from Berniece Pap, FNP sent at 09/02/2020  4:22 PM EDT ----- CBC is stable since last 3 months, however creatinine is slightly elevated meaning decreased kidney function, patient should have this rechecked in 3 months and should be advised to hydrate lightly.

## 2020-09-02 NOTE — Progress Notes (Signed)
CBC is stable since last 3 months, however creatinine is slightly elevated meaning decreased kidney function, patient should have this rechecked in 3 months and should be advised to hydrate lightly.

## 2020-09-02 NOTE — Telephone Encounter (Signed)
Tried calling, no answer.  [facility] please advise pt of lab results if she calls back.   Thanks,   -Vernona Rieger

## 2020-09-06 ENCOUNTER — Telehealth: Payer: Self-pay

## 2020-09-06 LAB — GI PROFILE, STOOL, PCR

## 2020-09-06 NOTE — Telephone Encounter (Signed)
Pt advised.   Thanks,   -Marissa Weaver  

## 2020-09-06 NOTE — Telephone Encounter (Signed)
-----   Message from Margaretann Loveless, PA-C sent at 09/06/2020  7:50 AM EDT ----- Stool studies are completely negative. Most likely functional diarrhea from previous illness. Add probiotics and increase dietary fiber.

## 2020-10-03 ENCOUNTER — Other Ambulatory Visit: Payer: Self-pay | Admitting: Adult Health

## 2020-10-03 DIAGNOSIS — I10 Essential (primary) hypertension: Secondary | ICD-10-CM

## 2020-10-25 ENCOUNTER — Other Ambulatory Visit: Payer: Self-pay | Admitting: Adult Health

## 2020-10-25 DIAGNOSIS — I10 Essential (primary) hypertension: Secondary | ICD-10-CM

## 2020-10-25 NOTE — Telephone Encounter (Signed)
Pt called back to specify that it must be 5 MG tablets instead of 2.5 MG.

## 2020-10-25 NOTE — Telephone Encounter (Signed)
  Notes to clinic: Pt called back to specify that it must be 5 MG tablets instead of 2.5 MG   Requested Prescriptions  Pending Prescriptions Disp Refills   lisinopril (ZESTRIL) 2.5 MG tablet [Pharmacy Med Name: Lisinopril 2.5 MG Oral Tablet] 90 tablet 0    Sig: Take 1 tablet by mouth once daily      Cardiovascular:  ACE Inhibitors Failed - 10/25/2020 12:10 PM      Failed - Cr in normal range and within 180 days    Creatinine  Date Value Ref Range Status  04/30/2014 1.75 (H) 0.60 - 1.30 mg/dL Final   Creatinine, Ser  Date Value Ref Range Status  09/01/2020 1.47 (H) 0.57 - 1.00 mg/dL Final          Failed - Last BP in normal range    BP Readings from Last 1 Encounters:  09/01/20 (!) 148/90          Passed - K in normal range and within 180 days    Potassium  Date Value Ref Range Status  09/01/2020 4.3 3.5 - 5.2 mmol/L Final  04/30/2014 3.3 (L) 3.5 - 5.1 mmol/L Final          Passed - Patient is not pregnant      Passed - Valid encounter within last 6 months    Recent Outpatient Visits           1 month ago Primary hypertension   [facility] [place], Alessandra Bevels, [place]   2 months ago Primary hypertension   [employer], Eula Fried, FNP   5 months ago Primary hypertension   [facility], Alessandra Bevels, [place]   1 year ago Annual physical exam   [facility], Alessandra Bevels, [place]   2 years ago Essential hypertension   [facility], Alessandra Bevels, [place]       Future Appointments             In 1 month Bacigalupo, Marzella Schlein, MD [facility], [facility]

## 2020-12-05 ENCOUNTER — Ambulatory Visit: Payer: Self-pay | Admitting: Family Medicine

## 2020-12-23 ENCOUNTER — Other Ambulatory Visit: Payer: Self-pay

## 2020-12-23 ENCOUNTER — Encounter: Payer: Self-pay | Admitting: Family Medicine

## 2020-12-23 ENCOUNTER — Ambulatory Visit (INDEPENDENT_AMBULATORY_CARE_PROVIDER_SITE_OTHER): Payer: Medicare Other | Admitting: Family Medicine

## 2020-12-23 DIAGNOSIS — I10 Essential (primary) hypertension: Secondary | ICD-10-CM | POA: Diagnosis not present

## 2020-12-23 DIAGNOSIS — H353131 Nonexudative age-related macular degeneration, bilateral, early dry stage: Secondary | ICD-10-CM | POA: Diagnosis not present

## 2020-12-23 DIAGNOSIS — H2513 Age-related nuclear cataract, bilateral: Secondary | ICD-10-CM | POA: Diagnosis not present

## 2020-12-23 MED ORDER — LISINOPRIL 10 MG PO TABS: 10.0000 mg | ORAL_TABLET | Freq: Every day | ORAL | 3 refills | Status: DC

## 2020-12-23 NOTE — Progress Notes (Signed)
Established patient visit   Patient: Jessica Little   DOB: April 18, 1946   75 y.o. Female  MRN: [id number] Visit Date: 12/23/2020  Today's healthcare provider: Jacky Kindle, FNP   No chief complaint on file.  Subjective    HPI  Hypertension, follow-up  BP Readings from Last 3 Encounters:  12/23/20 (!) 149/84  09/01/20 (!) 148/90  08/11/20 (!) 168/94   Wt Readings from Last 3 Encounters:  12/23/20 171 lb (77.6 kg)  09/01/20 171 lb 9.6 oz (77.8 kg)  08/11/20 172 lb 3.2 oz (78.1 kg)     She was last seen for hypertension 3 months ago.  BP at that visit was as above. Management since that visit includes increasing Lisinopril to 5 mg daily.  She reports good compliance with treatment. She is not having side effects.  She is following a Regular diet. She is exercising. She does not smoke.  Use of agents associated with hypertension: none.   Outside blood pressures are running mostly 120's over 60's until yesterday when they ran in the 160's over 80's-90;s. Symptoms: No chest pain No chest pressure  No palpitations No syncope  No dyspnea No orthopnea  No paroxysmal nocturnal dyspnea No lower extremity edema   Pertinent labs: Lab Results  Component Value Date   CHOL 192 05/23/2020   HDL 57 05/23/2020   LDLCALC 107 (H) 05/23/2020   TRIG 160 (H) 05/23/2020   CHOLHDL 3.7 02/25/2019   Lab Results  Component Value Date   NA 141 09/01/2020   K 4.3 09/01/2020   CREATININE 1.47 (H) 09/01/2020   GFRNONAA 40 (L) 05/23/2020   GFRAA 46 (L) 05/23/2020   GLUCOSE 101 (H) 09/01/2020     The 10-year ASCVD risk score Denman George DC Jr., et al., 2013) is: 22.4%   ---------------------------------------------------------------------------------------------------      Medications: Outpatient Medications Prior to Visit  Medication Sig   Apple Cid Vn-Grn Tea-Bit Or-Cr (APPLE CIDER VINEGAR PLUS PO) Take by mouth.   atenolol (TENORMIN) 50 MG tablet Take 1 tablet (50 mg total)  by mouth daily.   calcium carbonate (OS-CAL) 600 MG TABS tablet Take 600 mg by mouth daily with breakfast.   Cholecalciferol (VITAMIN D3 PO) Take by mouth daily.   nortriptyline (PAMELOR) 50 MG capsule Take 1 capsule (50 mg total) by mouth at bedtime.   omeprazole (PRILOSEC) 40 MG capsule Take 1 capsule (40 mg total) by mouth 2 (two) times daily.   perphenazine (TRILAFON) 4 MG tablet TAKE 1 TABLET BY MOUTH AT BEDTIME   potassium chloride (KLOR-CON) 10 MEQ tablet Take 1 tablet (10 mEq total) by mouth daily.   simvastatin (ZOCOR) 40 MG tablet Take 1 tablet (40 mg total) by mouth at bedtime.   vitamin E 1000 UNIT capsule Take 1,000 Units by mouth daily.   [DISCONTINUED] lisinopril (ZESTRIL) 5 MG tablet Take 1 tablet (5 mg total) by mouth daily.   [DISCONTINUED] Pumpkin Seed-Soy Germ (AZO BLADDER CONTROL/GO-LESS) CAPS Take by mouth daily.  (Patient not taking: Reported on 12/23/2020)   No facility-administered medications prior to visit.    Review of Systems  Constitutional: Negative.   Respiratory: Negative.  Negative for shortness of breath.   Cardiovascular: Negative.  Negative for chest pain, palpitations and leg swelling.  Neurological: Negative.   Psychiatric/Behavioral:  Negative for agitation.       Objective    BP (!) 149/84 (BP Location: Right Arm, Patient Position: Sitting, Cuff Size: Large)   Pulse 70  Temp 98.4 F (36.9 C) (Oral)   Wt 171 lb (77.6 kg)   SpO2 100%   BMI 27.60 kg/m     Physical Exam Vitals and nursing note reviewed.  Constitutional:      Appearance: Normal appearance.  HENT:     Head: Normocephalic.  Eyes:     General: Vision grossly intact.     Comments: Plan for cataract surgery later this year, Left then Right.  Cardiovascular:     Rate and Rhythm: Normal rate and regular rhythm.     Pulses: Normal pulses.          Carotid pulses are 2+ on the right side and 2+ on the left side.      Radial pulses are 2+ on the right side and 2+ on the left  side.       Femoral pulses are 2+ on the right side and 2+ on the left side.      Popliteal pulses are 2+ on the right side and 2+ on the left side.       Dorsalis pedis pulses are 2+ on the right side and 2+ on the left side.       Posterior tibial pulses are 2+ on the right side and 2+ on the left side.     Heart sounds: Normal heart sounds, S1 normal and S2 normal. No murmur heard.   No friction rub. No gallop.  Pulmonary:     Effort: Pulmonary effort is normal.     Breath sounds: Normal breath sounds.  Abdominal:     General: Bowel sounds are normal.     Palpations: Abdomen is soft.  Musculoskeletal:     Right lower leg: No edema.     Left lower leg: No edema.  Skin:    General: Skin is warm and dry.  Neurological:     General: No focal deficit present.     Mental Status: She is alert and oriented to person, place, and time.  Psychiatric:        Attention and Perception: Attention normal.        Mood and Affect: Mood normal.        Speech: Speech normal.        Behavior: Behavior normal.    No results found for any visits on 12/23/20.  Assessment & Plan     Problem List Items Addressed This Visit       Cardiovascular and Mediastinum   BP (high blood pressure)    No issues with medication BP stable in office, pt with home readings up to 160s Lisinopril increased; can take 2-5mg  tablets or 1-10 mg table, which new Rx was sent in for Denies CP, SOB, palpitations No edema noted Currently exercising 2-45 swim sessions/wk       Relevant Medications   lisinopril (ZESTRIL) 10 MG tablet      Return in about 3 months (around 03/25/2021) for chonic disease management.      Leilani Merl, FNP, have reviewed all documentation for this visit. The documentation on 12/23/20 for the exam, diagnosis, procedures, and orders are all accurate and complete.    Jacky Kindle, FNP  [facility] [phone] (phone) [phone] (fax)  [facility]

## 2020-12-23 NOTE — Assessment & Plan Note (Signed)
No issues with medication BP stable in office, pt with home readings up to 160s Lisinopril increased; can take 2-5mg  tablets or 1-10 mg table, which new Rx was sent in for Denies CP, SOB, palpitations No edema noted Currently exercising 2-45 swim sessions/wk

## 2021-01-09 ENCOUNTER — Encounter: Payer: Self-pay | Admitting: Ophthalmology

## 2021-01-16 ENCOUNTER — Other Ambulatory Visit: Payer: Self-pay

## 2021-01-16 ENCOUNTER — Telehealth: Payer: Self-pay | Admitting: Family Medicine

## 2021-01-16 DIAGNOSIS — E7800 Pure hypercholesterolemia, unspecified: Secondary | ICD-10-CM

## 2021-01-16 MED ORDER — SIMVASTATIN 40 MG PO TABS: 40.0000 mg | ORAL_TABLET | Freq: Every day | ORAL | 1 refills | Status: DC

## 2021-01-16 NOTE — Telephone Encounter (Signed)
Converted to refill request 

## 2021-01-16 NOTE — Telephone Encounter (Signed)
[facility] faxed refill request for the following medications:    simvastatin (ZOCOR) 40 MG tablet   Please advise.

## 2021-01-18 ENCOUNTER — Encounter
Admission: RE | Disposition: A | Discharge status: Home / Self Care | Payer: Self-pay | Source: Home / Self Care | Attending: Ophthalmology

## 2021-01-18 ENCOUNTER — Ambulatory Visit: Payer: Medicare Other | Admitting: Anesthesiology

## 2021-01-18 ENCOUNTER — Encounter: Payer: Self-pay | Admitting: Ophthalmology

## 2021-01-18 ENCOUNTER — Ambulatory Visit
Admission: RE | Admit: 2021-01-18 | Discharge: 2021-01-18 | Disposition: A | Discharge status: Home / Self Care | Payer: Medicare Other | Attending: Ophthalmology | Admitting: Ophthalmology

## 2021-01-18 ENCOUNTER — Other Ambulatory Visit: Payer: Self-pay

## 2021-01-18 DIAGNOSIS — Z79899 Other long term (current) drug therapy: Secondary | ICD-10-CM | POA: Diagnosis not present

## 2021-01-18 DIAGNOSIS — H25812 Combined forms of age-related cataract, left eye: Secondary | ICD-10-CM | POA: Diagnosis not present

## 2021-01-18 DIAGNOSIS — H2512 Age-related nuclear cataract, left eye: Secondary | ICD-10-CM | POA: Diagnosis not present

## 2021-01-18 DIAGNOSIS — H2511 Age-related nuclear cataract, right eye: Secondary | ICD-10-CM | POA: Diagnosis not present

## 2021-01-18 HISTORY — PX: CATARACT EXTRACTION W/PHACO: SHX586

## 2021-01-18 SURGERY — PHACOEMULSIFICATION, CATARACT, WITH IOL INSERTION
Anesthesia: Monitor Anesthesia Care | Site: Eye | Laterality: Left

## 2021-01-18 MED ORDER — SIGHTPATH DOSE#1 BSS IO SOLN
INTRAOCULAR | Status: DC | PRN
  Administered 2021-01-18: 81 mL via OPHTHALMIC

## 2021-01-18 MED ORDER — PHENYLEPHRINE HCL 10 % OP SOLN
1.0000 [drp] | OPHTHALMIC | Status: DC | PRN
  Administered 2021-01-18 (×3): 1 [drp] via OPHTHALMIC

## 2021-01-18 MED ORDER — TETRACAINE HCL 0.5 % OP SOLN
1.0000 [drp] | OPHTHALMIC | Status: DC | PRN
  Administered 2021-01-18 (×3): 1 [drp] via OPHTHALMIC

## 2021-01-18 MED ORDER — LACTATED RINGERS IV SOLN: INTRAVENOUS | Status: DC

## 2021-01-18 MED ORDER — SIGHTPATH DOSE#1 BSS IO SOLN
INTRAOCULAR | Status: DC | PRN
  Administered 2021-01-18: 15 mL

## 2021-01-18 MED ORDER — FENTANYL CITRATE (PF) 100 MCG/2ML IJ SOLN
INTRAMUSCULAR | Status: DC | PRN
  Administered 2021-01-18: 50 ug via INTRAVENOUS

## 2021-01-18 MED ORDER — MIDAZOLAM HCL 2 MG/2ML IJ SOLN
INTRAMUSCULAR | Status: DC | PRN
  Administered 2021-01-18: 1 mg via INTRAVENOUS

## 2021-01-18 MED ORDER — SIGHTPATH DOSE#1 NA HYALUR & NA CHOND-NA HYALUR IO KIT
PACK | INTRAOCULAR | Status: DC | PRN
  Administered 2021-01-18: 1 via OPHTHALMIC

## 2021-01-18 MED ORDER — BRIMONIDINE TARTRATE-TIMOLOL 0.2-0.5 % OP SOLN
OPHTHALMIC | Status: DC | PRN
  Administered 2021-01-18: 1 [drp] via OPHTHALMIC

## 2021-01-18 MED ORDER — ACETAMINOPHEN 325 MG PO TABS: 325.0000 mg | ORAL_TABLET | ORAL | Status: DC | PRN

## 2021-01-18 MED ORDER — CEFUROXIME OPHTHALMIC INJECTION 1 MG/0.1 ML
INJECTION | OPHTHALMIC | Status: DC | PRN
  Administered 2021-01-18: 0.1 mL via INTRACAMERAL

## 2021-01-18 MED ORDER — SIGHTPATH DOSE#1 BSS IO SOLN
INTRAOCULAR | Status: DC | PRN
  Administered 2021-01-18: 1 mL

## 2021-01-18 MED ORDER — ACETAMINOPHEN 160 MG/5ML PO SOLN: 325.0000 mg | ORAL | Status: DC | PRN

## 2021-01-18 MED ORDER — CYCLOPENTOLATE HCL 2 % OP SOLN
1.0000 [drp] | OPHTHALMIC | Status: DC | PRN
  Administered 2021-01-18 (×3): 1 [drp] via OPHTHALMIC

## 2021-01-18 SURGICAL SUPPLY — 19 items
CANNULA ANT/CHMB 27G (MISCELLANEOUS) ×1 IMPLANT
CANNULA ANT/CHMB 27GA (MISCELLANEOUS) ×2 IMPLANT
GLOVE SRG 8 PF TXTR STRL LF DI (GLOVE) ×1 IMPLANT
GLOVE SURG ENC TEXT LTX SZ7.5 (GLOVE) ×2 IMPLANT
GLOVE SURG UNDER POLY LF SZ8 (GLOVE) ×2
GOWN STRL REUS W/ TWL LRG LVL3 (GOWN DISPOSABLE) ×2 IMPLANT
GOWN STRL REUS W/TWL LRG LVL3 (GOWN DISPOSABLE) ×4
LENS IOL TECNIS EYHANCE 14.0 (Intraocular Lens) ×1 IMPLANT
MARKER SKIN DUAL TIP RULER LAB (MISCELLANEOUS) ×2 IMPLANT
NDL CAPSULORHEX 25GA (NEEDLE) ×1 IMPLANT
NDL FILTER BLUNT 18X1 1/2 (NEEDLE) ×2 IMPLANT
NEEDLE CAPSULORHEX 25GA (NEEDLE) ×2 IMPLANT
NEEDLE FILTER BLUNT 18X 1/2SAF (NEEDLE) ×2
NEEDLE FILTER BLUNT 18X1 1/2 (NEEDLE) ×2 IMPLANT
PACK EYE AFTER SURG (MISCELLANEOUS) ×2 IMPLANT
SYR 3ML LL SCALE MARK (SYRINGE) ×4 IMPLANT
SYR TB 1ML LUER SLIP (SYRINGE) ×2 IMPLANT
WATER STERILE IRR 250ML POUR (IV SOLUTION) ×2 IMPLANT
WIPE NON LINTING 3.25X3.25 (MISCELLANEOUS) ×2 IMPLANT

## 2021-01-18 NOTE — Anesthesia Postprocedure Evaluation (Signed)
Anesthesia Post Note  Patient: Jessica Little  Procedure(s) Performed: CATARACT EXTRACTION PHACO AND INTRAOCULAR LENS PLACEMENT (IOC) LEFT 7.40 01:11.7 (Left: Eye)     Patient location during evaluation: PACU Anesthesia Type: MAC Level of consciousness: awake and alert Pain management: pain level controlled Vital Signs Assessment: post-procedure vital signs reviewed and stable Respiratory status: spontaneous breathing, nonlabored ventilation, respiratory function stable and patient connected to nasal cannula oxygen Cardiovascular status: stable and blood pressure returned to baseline Postop Assessment: no apparent nausea or vomiting Anesthetic complications: no   No notable events documented.  Trecia Rogers

## 2021-01-18 NOTE — H&P (Signed)
[facility]   Primary Care Physician:  Jacky Kindle, FNP Ophthalmologist: Dr. Lockie Mola  Pre-Procedure History & Physical: HPI:  Jessica Little is a 75 y.o. female here for ophthalmic surgery.   Past Medical History:  Diagnosis Date   Anxiety    GERD (gastroesophageal reflux disease)    Hyperlipidemia    Hypertension     Past Surgical History:  Procedure Laterality Date   CHOLECYSTECTOMY     COLONOSCOPY  01/01/2006   COLONOSCOPY WITH PROPOFOL N/A 08/23/2015   Procedure: COLONOSCOPY WITH PROPOFOL;  Surgeon: Earline Mayotte, MD;  Location: [facility] ENDOSCOPY;  Service: Endoscopy;  Laterality: N/A;   HIATAL HERNIA REPAIR  2019    Prior to Admission medications   Medication Sig Start Date End Date Taking? Authorizing Provider  atenolol (TENORMIN) 50 MG tablet Take 1 tablet (50 mg total) by mouth daily. 05/23/20  Yes Margaretann Loveless, PA-C  Biotin 50093 MCG TABS Take by mouth.   Yes [provider]  calcium carbonate (OS-CAL) 600 MG TABS tablet Take 600 mg by mouth daily with breakfast.   Yes [provider]  Cholecalciferol (VITAMIN D3 PO) Take by mouth daily.   Yes [provider]  Flaxseed, Linseed, (FLAXSEED OIL) 1000 MG CAPS Take by mouth.   Yes [provider]  lisinopril (ZESTRIL) 10 MG tablet Take 1 tablet (10 mg total) by mouth daily. 12/23/20  Yes Merita Norton T, FNP  Magnesium 400 MG CAPS Take by mouth.   Yes [provider]  nortriptyline (PAMELOR) 50 MG capsule Take 1 capsule (50 mg total) by mouth at bedtime. 07/04/20  Yes Cottle, Steva Ready., MD  omeprazole (PRILOSEC) 40 MG capsule Take 1 capsule (40 mg total) by mouth 2 (two) times daily. 05/23/20  Yes Margaretann Loveless, PA-C  perphenazine (TRILAFON) 4 MG tablet TAKE 1 TABLET BY MOUTH AT BEDTIME 07/04/20  Yes Cottle, Steva Ready., MD  potassium chloride (KLOR-CON) 10 MEQ tablet Take 1 tablet (10 mEq total) by mouth daily. 05/23/20  Yes Margaretann Loveless, PA-C  simvastatin (ZOCOR) 40 MG tablet Take 1 tablet (40 mg total) by mouth at bedtime. 01/16/21  Yes Jacky Kindle, FNP  vitamin E 1000 UNIT capsule Take 1,000 Units by mouth daily.   Yes [provider]  Zinc Sulfate (ZINC 15 PO) Take by mouth.   Yes [provider]  potassium chloride (KLOR-CON M10) 10 MEQ tablet Take 1 tablet (10 mEq total) by mouth daily. 09/23/19 04/20/20  Margaretann Loveless, PA-C    Allergies as of 12/26/2020   (No Known Allergies)    Family History  Problem Relation Age of Onset   Arrhythmia Mother    Heart attack Father    Stroke Father    Healthy Sister    Healthy Brother    Healthy Sister    Breast cancer Neg Hx     Social History   Socioeconomic History   Marital status: Widowed    Spouse name: Not on file   Number of children: 0   Years of education: College   Highest education level: Bachelor's degree (e.g., BA, AB, BS)  Occupational History   Occupation: Retired  Tobacco Use   Smoking status: Never   Smokeless tobacco: Never  Vaping Use   Vaping Use: Never used  Substance and Sexual Activity   Alcohol use: No   Drug use: No   Sexual activity: Not on file  Other Topics Concern  Not on file  Social History Narrative   Not on file   Social Determinants of Health   Financial Resource Strain: Low Risk    Difficulty of Paying Living Expenses: Not hard at all  Food Insecurity: No Food Insecurity   Worried About Programme researcher, broadcasting/film/video in the Last Year: Never true   Barista in the Last Year: Never true  Transportation Needs: No Transportation Needs   Lack of Transportation (Medical): No   Lack of Transportation (Non-Medical): No  Physical Activity: Insufficiently Active   Days of Exercise per Week: 2 days   Minutes of Exercise per Session: 40 min  Stress: No Stress Concern Present   Feeling of Stress : Not at all  Social Connections: Moderately Isolated   Frequency of Communication with Friends  and Family: More than three times a week   Frequency of Social Gatherings with Friends and Family: More than three times a week   Attends Religious Services: More than 4 times per year   Active Member of [employer] or Organizations: No   Attends Banker Meetings: Never   Marital Status: Widowed  Catering manager Violence: Not At Risk   Fear of Current or Ex-Partner: No   Emotionally Abused: No   Physically Abused: No   Sexually Abused: No    Review of Systems: See HPI, otherwise negative ROS  Physical Exam: BP (!) 176/95   Pulse 63   Temp (!) 97.5 F (36.4 C) (Temporal)   Resp 16   Ht 5\' 6"  (1.676 m)   Wt 75.8 kg   SpO2 100%   BMI 26.95 kg/m  General:   Alert,  pleasant and cooperative in NAD Head:  Normocephalic and atraumatic. Lungs:  Clear to auscultation.    Heart:  Regular rate and rhythm.   Impression/Plan: Jessica Little is here for ophthalmic surgery.  Risks, benefits, limitations, and alternatives regarding ophthalmic surgery have been reviewed with the patient.  Questions have been answered.  All parties agreeable.   Corene Cornea, MD  01/18/2021, 10:27 AM

## 2021-01-18 NOTE — Op Note (Signed)
  OPERATIVE NOTE  Jessica Little [id number] 01/18/2021   PREOPERATIVE DIAGNOSIS:  Nuclear sclerotic cataract left eye. H25.12   POSTOPERATIVE DIAGNOSIS:    Nuclear sclerotic cataract left eye.     PROCEDURE:  Phacoemusification with posterior chamber intraocular lens placement of the left eye  Ultrasound time: Procedure(s): CATARACT EXTRACTION PHACO AND INTRAOCULAR LENS PLACEMENT (IOC) LEFT 7.40 01:11.7 (Left)  LENS:   Implant Name Type Inv. Item Serial No. Manufacturer Lot No. LRB No. Used Action  LENS IOL TECNIS EYHANCE 14.0 - [id number] Intraocular Lens LENS IOL TECNIS EYHANCE 14.0 [id number] JOHNSON   Left 1 Implanted      SURGEON:  Deirdre Evener, MD   ANESTHESIA:  Topical with tetracaine drops and 2% Xylocaine jelly, augmented with 1% preservative-free intracameral lidocaine.    COMPLICATIONS:  None.   DESCRIPTION OF PROCEDURE:  The patient was identified in the holding room and transported to the operating room and placed in the supine position under the operating microscope.  The left eye was identified as the operative eye and it was prepped and draped in the usual sterile ophthalmic fashion.   A 1 millimeter clear-corneal paracentesis was made at the 1:30 position.  0.5 ml of preservative-free 1% lidocaine was injected into the anterior chamber.  The anterior chamber was filled with Viscoat viscoelastic.  A 2.4 millimeter keratome was used to make a near-clear corneal incision at the 10:30 position.  .  A curvilinear capsulorrhexis was made with a cystotome and capsulorrhexis forceps.  Balanced salt solution was used to hydrodissect and hydrodelineate the nucleus.   Phacoemulsification was then used in stop and chop fashion to remove the lens nucleus and epinucleus.  The remaining cortex was then removed using the irrigation and aspiration handpiece. Provisc was then placed into the capsular bag to distend it for lens placement.  A lens was then injected into the  capsular bag.  The remaining viscoelastic was aspirated.   Wounds were hydrated with balanced salt solution.  The anterior chamber was inflated to a physiologic pressure with balanced salt solution.  No wound leaks were noted. Cefuroxime 0.1 ml of a 10mg /ml solution was injected into the anterior chamber for a dose of 1 mg of intracameral antibiotic at the completion of the case.   Timolol and Brimonidine drops were applied to the eye.  The patient was taken to the recovery room in stable condition without complications of anesthesia or surgery.  [name] 01/18/2021, 12:01 PM

## 2021-01-18 NOTE — Anesthesia Preprocedure Evaluation (Signed)
Anesthesia Evaluation  Patient identified by MRN, date of birth, ID band Patient awake    Reviewed: Allergy & Precautions, H&P , NPO status , Patient's Chart, lab work & pertinent test results, reviewed documented beta blocker date and time   Airway Mallampati: II  TM Distance: >3 FB Neck ROM: full    Dental no notable dental hx.    Pulmonary neg pulmonary ROS,    Pulmonary exam normal breath sounds clear to auscultation       Cardiovascular Exercise Tolerance: Good hypertension, Normal cardiovascular exam Rhythm:regular Rate:Normal     Neuro/Psych PSYCHIATRIC DISORDERS Anxiety Depression Schizophrenia negative neurological ROS     GI/Hepatic Neg liver ROS, GERD  Medicated and Controlled,  Endo/Other  negative endocrine ROS  Renal/GU negative Renal ROS  negative genitourinary   Musculoskeletal   Abdominal   Peds  Hematology negative hematology ROS (+)   Anesthesia Other Findings   Reproductive/Obstetrics negative OB ROS                             Anesthesia Physical Anesthesia Plan  ASA: 2  Anesthesia Plan: MAC   Post-op Pain Management:    Induction:   PONV Risk Score and Plan:   Airway Management Planned:   Additional Equipment:   Intra-op Plan:   Post-operative Plan:   Informed Consent: I have reviewed the patients History and Physical, chart, labs and discussed the procedure including the risks, benefits and alternatives for the proposed anesthesia with the patient or authorized representative who has indicated his/her understanding and acceptance.     Dental Advisory Given  Plan Discussed with: CRNA and Anesthesiologist  Anesthesia Plan Comments:         Anesthesia Quick Evaluation

## 2021-01-18 NOTE — Transfer of Care (Signed)
Immediate Anesthesia Transfer of Care Note  Patient: Jessica Little  Procedure(s) Performed: CATARACT EXTRACTION PHACO AND INTRAOCULAR LENS PLACEMENT (IOC) LEFT 7.40 01:11.7 (Left: Eye)  Patient Location: PACU  Anesthesia Type: MAC  Level of Consciousness: awake, alert  and patient cooperative  Airway and Oxygen Therapy: Patient Spontanous Breathing and Patient connected to supplemental oxygen  Post-op Assessment: Post-op Vital signs reviewed, Patient's Cardiovascular Status Stable, Respiratory Function Stable, Patent Airway and No signs of Nausea or vomiting  Post-op Vital Signs: Reviewed and stable  Complications: No notable events documented.

## 2021-01-19 ENCOUNTER — Encounter: Payer: Self-pay | Admitting: Ophthalmology

## 2021-01-19 ENCOUNTER — Other Ambulatory Visit: Payer: Self-pay

## 2021-01-20 ENCOUNTER — Encounter: Payer: Self-pay | Admitting: Ophthalmology

## 2021-01-23 DIAGNOSIS — H2511 Age-related nuclear cataract, right eye: Secondary | ICD-10-CM | POA: Diagnosis not present

## 2021-02-01 ENCOUNTER — Encounter
Admission: RE | Disposition: A | Discharge status: Home / Self Care | Payer: Self-pay | Source: Home / Self Care | Attending: Ophthalmology

## 2021-02-01 ENCOUNTER — Ambulatory Visit: Payer: Medicare Other | Admitting: Anesthesiology

## 2021-02-01 ENCOUNTER — Encounter: Payer: Self-pay | Admitting: Ophthalmology

## 2021-02-01 ENCOUNTER — Other Ambulatory Visit: Payer: Self-pay

## 2021-02-01 ENCOUNTER — Ambulatory Visit
Admission: RE | Admit: 2021-02-01 | Discharge: 2021-02-01 | Disposition: A | Discharge status: Home / Self Care | Payer: Medicare Other | Attending: Ophthalmology | Admitting: Ophthalmology

## 2021-02-01 DIAGNOSIS — Z79899 Other long term (current) drug therapy: Secondary | ICD-10-CM | POA: Diagnosis not present

## 2021-02-01 DIAGNOSIS — I10 Essential (primary) hypertension: Secondary | ICD-10-CM | POA: Insufficient documentation

## 2021-02-01 DIAGNOSIS — K219 Gastro-esophageal reflux disease without esophagitis: Secondary | ICD-10-CM | POA: Insufficient documentation

## 2021-02-01 DIAGNOSIS — Z9049 Acquired absence of other specified parts of digestive tract: Secondary | ICD-10-CM | POA: Insufficient documentation

## 2021-02-01 DIAGNOSIS — E785 Hyperlipidemia, unspecified: Secondary | ICD-10-CM | POA: Insufficient documentation

## 2021-02-01 DIAGNOSIS — H25811 Combined forms of age-related cataract, right eye: Secondary | ICD-10-CM | POA: Diagnosis not present

## 2021-02-01 DIAGNOSIS — H2511 Age-related nuclear cataract, right eye: Secondary | ICD-10-CM | POA: Diagnosis not present

## 2021-02-01 DIAGNOSIS — Z8249 Family history of ischemic heart disease and other diseases of the circulatory system: Secondary | ICD-10-CM | POA: Diagnosis not present

## 2021-02-01 HISTORY — PX: CATARACT EXTRACTION W/PHACO: SHX586

## 2021-02-01 SURGERY — PHACOEMULSIFICATION, CATARACT, WITH IOL INSERTION
Anesthesia: Monitor Anesthesia Care | Site: Eye | Laterality: Right

## 2021-02-01 MED ORDER — SIGHTPATH DOSE#1 BSS IO SOLN
INTRAOCULAR | Status: DC | PRN
  Administered 2021-02-01: 15 mL via INTRAOCULAR

## 2021-02-01 MED ORDER — SIGHTPATH DOSE#1 NA HYALUR & NA CHOND-NA HYALUR IO KIT
PACK | INTRAOCULAR | Status: DC | PRN
  Administered 2021-02-01: 1 via OPHTHALMIC

## 2021-02-01 MED ORDER — ACETAMINOPHEN 325 MG PO TABS: 325.0000 mg | ORAL_TABLET | ORAL | Status: DC | PRN

## 2021-02-01 MED ORDER — TETRACAINE HCL 0.5 % OP SOLN
1.0000 [drp] | OPHTHALMIC | Status: DC | PRN
  Administered 2021-02-01 (×3): 1 [drp] via OPHTHALMIC

## 2021-02-01 MED ORDER — SIGHTPATH DOSE#1 BSS IO SOLN
INTRAOCULAR | Status: DC | PRN
  Administered 2021-02-01: 72 mL via OPHTHALMIC

## 2021-02-01 MED ORDER — LACTATED RINGERS IV SOLN: INTRAVENOUS | Status: DC

## 2021-02-01 MED ORDER — PHENYLEPHRINE HCL 10 % OP SOLN
1.0000 [drp] | OPHTHALMIC | Status: AC | PRN
  Administered 2021-02-01 (×3): 1 [drp] via OPHTHALMIC

## 2021-02-01 MED ORDER — SIGHTPATH DOSE#1 BSS IO SOLN
INTRAOCULAR | Status: DC | PRN
  Administered 2021-02-01: 2 mL

## 2021-02-01 MED ORDER — ONDANSETRON HCL 4 MG/2ML IJ SOLN
4.0000 mg | Freq: Once | INTRAMUSCULAR | Status: DC | PRN
Start: 2021-02-01 — End: 2021-02-01

## 2021-02-01 MED ORDER — ACETAMINOPHEN 160 MG/5ML PO SOLN: 325.0000 mg | ORAL | Status: DC | PRN

## 2021-02-01 MED ORDER — BRIMONIDINE TARTRATE-TIMOLOL 0.2-0.5 % OP SOLN
OPHTHALMIC | Status: DC | PRN
  Administered 2021-02-01: 1 [drp] via OPHTHALMIC

## 2021-02-01 MED ORDER — MIDAZOLAM HCL 2 MG/2ML IJ SOLN
INTRAMUSCULAR | Status: DC | PRN
  Administered 2021-02-01: 1 mg via INTRAVENOUS

## 2021-02-01 MED ORDER — FENTANYL CITRATE (PF) 100 MCG/2ML IJ SOLN
INTRAMUSCULAR | Status: DC | PRN
  Administered 2021-02-01: 50 ug via INTRAVENOUS

## 2021-02-01 MED ORDER — CYCLOPENTOLATE HCL 2 % OP SOLN
1.0000 [drp] | OPHTHALMIC | Status: AC | PRN
  Administered 2021-02-01 (×3): 1 [drp] via OPHTHALMIC

## 2021-02-01 MED ORDER — CEFUROXIME OPHTHALMIC INJECTION 1 MG/0.1 ML
INJECTION | OPHTHALMIC | Status: DC | PRN
  Administered 2021-02-01: 0.1 mL via INTRACAMERAL

## 2021-02-01 SURGICAL SUPPLY — 20 items
CANNULA ANT/CHMB 27G (MISCELLANEOUS) ×1 IMPLANT
CANNULA ANT/CHMB 27GA (MISCELLANEOUS) ×2 IMPLANT
GLOVE SRG 8 PF TXTR STRL LF DI (GLOVE) ×1 IMPLANT
GLOVE SURG ENC TEXT LTX SZ7.5 (GLOVE) ×2 IMPLANT
GLOVE SURG UNDER POLY LF SZ8 (GLOVE) ×2
GOWN STRL REUS W/ TWL LRG LVL3 (GOWN DISPOSABLE) ×2 IMPLANT
GOWN STRL REUS W/TWL LRG LVL3 (GOWN DISPOSABLE) ×4
LENS IOL EYHANCE TORIC LL 14.0 ×2 IMPLANT
LENS IOL EYHANCE TRC 225 14.0 IMPLANT
MARKER SKIN DUAL TIP RULER LAB (MISCELLANEOUS) ×2 IMPLANT
NDL CAPSULORHEX 25GA (NEEDLE) ×1 IMPLANT
NDL FILTER BLUNT 18X1 1/2 (NEEDLE) ×2 IMPLANT
NEEDLE CAPSULORHEX 25GA (NEEDLE) ×2 IMPLANT
NEEDLE FILTER BLUNT 18X 1/2SAF (NEEDLE) ×2
NEEDLE FILTER BLUNT 18X1 1/2 (NEEDLE) ×2 IMPLANT
PACK EYE AFTER SURG (MISCELLANEOUS) ×2 IMPLANT
SYR 3ML LL SCALE MARK (SYRINGE) ×4 IMPLANT
SYR TB 1ML LUER SLIP (SYRINGE) ×2 IMPLANT
WATER STERILE IRR 250ML POUR (IV SOLUTION) ×2 IMPLANT
WIPE NON LINTING 3.25X3.25 (MISCELLANEOUS) ×2 IMPLANT

## 2021-02-01 NOTE — Op Note (Signed)
  LOCATION:  [facility]   PREOPERATIVE DIAGNOSIS:  Nuclear sclerotic cataract of the right eye.  H25.11   POSTOPERATIVE DIAGNOSIS:  Nuclear sclerotic cataract of the right eye.   PROCEDURE:  Phacoemulsification with Toric posterior chamber intraocular lens placement of the right eye.  Ultrasound time: Procedure(s): CATARACT EXTRACTION PHACO AND INTRAOCULAR LENS PLACEMENT (IOC) RIGHT EYHANCE TORIC 8.60 01:09.9  (Right)  LENS:   Implant Name Type Inv. Item Serial No. Manufacturer Lot No. LRB No. Used Action  Tecnis Eyhance Toric II IOL 14.0 Intraocular Lens  [id number] J[employer]ON [employer]  Right 1 Implanted     [id number] 14.0D  Toric intraocular lens with 2.25 diopters of cylindrical power with axis orientation at 99 degrees.   SURGEON:  Deirdre Evener, MD   ANESTHESIA: Topical with tetracaine drops and 2% Xylocaine jelly, augmented with 1% preservative-free intracameral lidocaine. .   COMPLICATIONS:  None.   DESCRIPTION OF PROCEDURE:  The patient was identified in the holding room and transported to the operating suite and placed in the supine position under the operating microscope.  The right eye was identified as the operative eye, and it was prepped and draped in the usual sterile ophthalmic fashion.    A clear-corneal paracentesis incision was made at the 12:00 position.  0.5 ml of preservative-free 1% lidocaine was injected into the anterior chamber. The anterior chamber was filled with Viscoat.  A 2.4 millimeter near clear corneal incision was then made at the 9:00 position.  A cystotome and capsulorrhexis forceps were then used to make a curvilinear capsulorrhexis.  Hydrodissection and hydrodelineation were then performed using balanced salt solution.   Phacoemulsification was then used in stop and chop fashion to remove the lens, nucleus and epinucleus.  The remaining cortex was aspirated using the irrigation and aspiration handpiece.  Provisc viscoelastic was then  placed into the capsular bag to distend it for lens placement.  The Verion digital marker was used to align the implant at the intended axis.   A Toric lens was then injected into the capsular bag.  It was rotated clockwise until the axis marks on the lens were approximately 15 degrees in the counterclockwise direction to the intended alignment.  The viscoelastic was aspirated from the eye using the irrigation aspiration handpiece.  Then, a Koch spatula through the sideport incision was used to rotate the lens in a clockwise direction until the axis markings of the intraocular lens were lined up with the Verion alignment.  Balanced salt solution was then used to hydrate the wounds. Cefuroxime 0.1 ml of a 10mg /ml solution was injected into the anterior chamber for a dose of 1 mg of intracameral antibiotic at the completion of the case.    The eye was noted to have a physiologic pressure and there was no wound leak noted.   Timolol and Brimonidine drops were applied to the eye.  The patient was taken to the recovery room in stable condition having had no complications of anesthesia or surgery.  Bayley Hurn 02/01/2021, 11:55 AM

## 2021-02-01 NOTE — Transfer of Care (Signed)
Immediate Anesthesia Transfer of Care Note  Patient: Jessica Little  Procedure(s) Performed: CATARACT EXTRACTION PHACO AND INTRAOCULAR LENS PLACEMENT (IOC) RIGHT EYHANCE TORIC 8.60 01:09.9  (Right: Eye)  Patient Location: PACU  Anesthesia Type: MAC  Level of Consciousness: awake, alert  and patient cooperative  Airway and Oxygen Therapy: Patient Spontanous Breathing and Patient connected to supplemental oxygen  Post-op Assessment: Post-op Vital signs reviewed, Patient's Cardiovascular Status Stable, Respiratory Function Stable, Patent Airway and No signs of Nausea or vomiting  Post-op Vital Signs: Reviewed and stable  Complications: No notable events documented.

## 2021-02-01 NOTE — Anesthesia Procedure Notes (Signed)
Procedure Name: MAC Date/Time: 02/01/2021 11:35 AM Performed by: Mayme Genta, CRNA Pre-anesthesia Checklist: Patient identified, Emergency Drugs available, Suction available, Timeout performed and Patient being monitored Patient Re-evaluated:Patient Re-evaluated prior to induction Oxygen Delivery Method: Nasal cannula Placement Confirmation: positive ETCO2

## 2021-02-01 NOTE — H&P (Signed)
[facility]   Primary Care Physician:  Jacky Kindle, FNP Ophthalmologist: Dr. Lockie Mola  Pre-Procedure History & Physical: HPI:  Jessica Little is a 75 y.o. female here for ophthalmic surgery.   Past Medical History:  Diagnosis Date   Anxiety    GERD (gastroesophageal reflux disease)    Hyperlipidemia    Hypertension     Past Surgical History:  Procedure Laterality Date   CATARACT EXTRACTION W/PHACO Left 01/18/2021   Procedure: CATARACT EXTRACTION PHACO AND INTRAOCULAR LENS PLACEMENT (IOC) LEFT 7.40 01:11.7;  Surgeon: Lockie Mola, MD;  Location: [facility];  Service: Ophthalmology;  Laterality: Left;   CHOLECYSTECTOMY     COLONOSCOPY  01/01/2006   COLONOSCOPY WITH PROPOFOL N/A 08/23/2015   Procedure: COLONOSCOPY WITH PROPOFOL;  Surgeon: Earline Mayotte, MD;  Location: [facility];  Service: Endoscopy;  Laterality: N/A;   HIATAL HERNIA REPAIR  2019    Prior to Admission medications   Medication Sig Start Date End Date Taking? Authorizing Provider  atenolol (TENORMIN) 50 MG tablet Take 1 tablet (50 mg total) by mouth daily. 05/23/20  Yes Margaretann Loveless, PA-C  Biotin 81856 MCG TABS Take by mouth.   Yes [provider]  calcium carbonate (OS-CAL) 600 MG TABS tablet Take 600 mg by mouth daily with breakfast.   Yes [provider]  Cholecalciferol (VITAMIN D3 PO) Take by mouth daily.   Yes [provider]  lisinopril (ZESTRIL) 10 MG tablet Take 1 tablet (10 mg total) by mouth daily. 12/23/20  Yes Merita Norton T, FNP  Magnesium 400 MG CAPS Take by mouth.   Yes [provider]  nortriptyline (PAMELOR) 50 MG capsule Take 1 capsule (50 mg total) by mouth at bedtime. 07/04/20  Yes Cottle, Steva Ready., MD  omeprazole (PRILOSEC) 40 MG capsule Take 1 capsule (40 mg total) by mouth 2 (two) times daily. 05/23/20  Yes Margaretann Loveless, PA-C  potassium chloride (KLOR-CON) 10 MEQ tablet Take 1 tablet (10 mEq  total) by mouth daily. 05/23/20  Yes Margaretann Loveless, PA-C  simvastatin (ZOCOR) 40 MG tablet Take 1 tablet (40 mg total) by mouth at bedtime. 01/16/21  Yes Jacky Kindle, FNP  vitamin E 1000 UNIT capsule Take 1,000 Units by mouth daily.   Yes [provider]  Zinc Sulfate (ZINC 15 PO) Take by mouth.   Yes [provider]  Flaxseed, Linseed, (FLAXSEED OIL) 1000 MG CAPS Take by mouth.    [provider]  perphenazine (TRILAFON) 4 MG tablet TAKE 1 TABLET BY MOUTH AT BEDTIME 07/04/20   Cottle, Steva Ready., MD  potassium chloride (KLOR-CON M10) 10 MEQ tablet Take 1 tablet (10 mEq total) by mouth daily. 09/23/19 04/20/20  Margaretann Loveless, PA-C    Allergies as of 12/26/2020   (No Known Allergies)    Family History  Problem Relation Age of Onset   Arrhythmia Mother    Heart attack Father    Stroke Father    Healthy Sister    Healthy Brother    Healthy Sister    Breast cancer Neg Hx     Social History   Socioeconomic History   Marital status: Widowed    Spouse name: Not on file   Number of children: 0   Years of education: College   Highest education level: Bachelor's degree (e.g., BA, AB, BS)  Occupational History   Occupation: Retired  Tobacco Use   Smoking status: Never   Smokeless tobacco: Never  Vaping Use   Vaping Use: Never used  Substance and Sexual Activity   Alcohol use: No   Drug use: No   Sexual activity: Not on file  Other Topics Concern   Not on file  Social History Narrative   Not on file   Social Determinants of Health   Financial Resource Strain: Low Risk    Difficulty of Paying Living Expenses: Not hard at all  Food Insecurity: No Food Insecurity   Worried About Programme researcher, broadcasting/film/video in the Last Year: Never true   Ran Out of Food in the Last Year: Never true  Transportation Needs: No Transportation Needs   Lack of Transportation (Medical): No   Lack of Transportation (Non-Medical): No  Physical Activity: Insufficiently  Active   Days of Exercise per Week: 2 days   Minutes of Exercise per Session: 40 min  Stress: No Stress Concern Present   Feeling of Stress : Not at all  Social Connections: Moderately Isolated   Frequency of Communication with Friends and Family: More than three times a week   Frequency of Social Gatherings with Friends and Family: More than three times a week   Attends Religious Services: More than 4 times per year   Active Member of [employer] or Organizations: No   Attends Banker Meetings: Never   Marital Status: Widowed  Catering manager Violence: Not At Risk   Fear of Current or Ex-Partner: No   Emotionally Abused: No   Physically Abused: No   Sexually Abused: No    Review of Systems: See HPI, otherwise negative ROS  Physical Exam: BP (!) 177/92   Pulse 60   Temp (!) 97.2 F (36.2 C) (Temporal)   Resp 18   Ht 5\' 6"  (1.676 m)   Wt 74.8 kg   SpO2 100%   BMI 26.63 kg/m  General:   Alert,  pleasant and cooperative in NAD Head:  Normocephalic and atraumatic. Lungs:  Clear to auscultation.    Heart:  Regular rate and rhythm.   Impression/Plan: Jessica Little is here for ophthalmic surgery.  Risks, benefits, limitations, and alternatives regarding ophthalmic surgery have been reviewed with the patient.  Questions have been answered.  All parties agreeable.   Corene Cornea, MD  02/01/2021, 10:39 AM

## 2021-02-01 NOTE — Anesthesia Postprocedure Evaluation (Signed)
Anesthesia Post Note  Patient: Jessica Little  Procedure(s) Performed: CATARACT EXTRACTION PHACO AND INTRAOCULAR LENS PLACEMENT (IOC) RIGHT EYHANCE TORIC 8.60 01:09.9  (Right: Eye)     Patient location during evaluation: PACU Anesthesia Type: MAC Level of consciousness: awake and alert Pain management: pain level controlled Vital Signs Assessment: post-procedure vital signs reviewed and stable Respiratory status: spontaneous breathing, nonlabored ventilation, respiratory function stable and patient connected to nasal cannula oxygen Cardiovascular status: stable and blood pressure returned to baseline Postop Assessment: no apparent nausea or vomiting Anesthetic complications: no   No notable events documented.  Sinda Du

## 2021-02-01 NOTE — Anesthesia Preprocedure Evaluation (Signed)
Anesthesia Evaluation  Patient identified by MRN, date of birth, ID band Patient awake    Reviewed: Allergy & Precautions, H&P , NPO status , Patient's Chart, lab work & pertinent test results, reviewed documented beta blocker date and time   Airway Mallampati: II  TM Distance: >3 FB Neck ROM: full    Dental no notable dental hx.    Pulmonary neg pulmonary ROS,    Pulmonary exam normal breath sounds clear to auscultation       Cardiovascular Exercise Tolerance: Good hypertension, Normal cardiovascular exam Rhythm:regular Rate:Normal     Neuro/Psych PSYCHIATRIC DISORDERS Anxiety Depression Schizophrenia negative neurological ROS     GI/Hepatic Neg liver ROS, GERD  Medicated and Controlled,  Endo/Other  negative endocrine ROS  Renal/GU negative Renal ROS  negative genitourinary   Musculoskeletal   Abdominal   Peds  Hematology negative hematology ROS (+)   Anesthesia Other Findings   Reproductive/Obstetrics negative OB ROS                             Anesthesia Physical  Anesthesia Plan  ASA: 3  Anesthesia Plan: MAC   Post-op Pain Management:    Induction:   PONV Risk Score and Plan: 2 and Midazolam, TIVA and Treatment may vary due to age or medical condition  Airway Management Planned: Natural Airway and Nasal Cannula  Additional Equipment:   Intra-op Plan:   Post-operative Plan:   Informed Consent: I have reviewed the patients History and Physical, chart, labs and discussed the procedure including the risks, benefits and alternatives for the proposed anesthesia with the patient or authorized representative who has indicated his/her understanding and acceptance.     Dental Advisory Given  Plan Discussed with: CRNA and Anesthesiologist  Anesthesia Plan Comments:         Anesthesia Quick Evaluation  Patient Active Problem List   Diagnosis Date Noted  .  Gastroesophageal reflux disease with esophagitis without hemorrhage 05/23/2020  . Hypokalemia 05/23/2020  . GAD (generalized anxiety disorder) 05/25/2018  . Osteopenia 10/10/2016  . H/O urinary frequency 07/06/2015  . Schizoaffective disorder, depressive type ([place]) 02/03/2015  . HLD (hyperlipidemia) 02/03/2015  . BP (high blood pressure) 02/03/2015  . Cannot sleep 02/03/2015  . Thyroid nodule 02/03/2015    CBC Latest Ref Rng & Units 05/23/2020 02/25/2019 01/30/2018  WBC 3.4 - 10.8 x10E3/uL 7.1 6.2 7.6  Hemoglobin 11.1 - 15.9 g/dL 13.1 12.8 13.6  Hematocrit 34.0 - 46.6 % 39.8 39.7 40.4  Platelets 150 - 450 x10E3/uL 236 245 236   BMP Latest Ref Rng & Units 09/01/2020 05/23/2020 02/25/2019  Glucose 65 - 99 mg/dL 101(H) 84 101(H)  BUN 8 - 27 mg/dL _0 Creatinine 0.57 - 1.00 mg/dL 1.47(H) 1.34(H) 1.33(H)  BUN/Creat Ratio 12 - _1 Sodium 134 - 144 mmol/L 141 141 143  Potassium 3.5 - 5.2 mmol/L 4.3 4.0 3.3(L)  Chloride 96 - 106 mmol/L 101 102 103  CO2 20 - 29 mmol/L _2 Calcium 8.7 - 10.3 mg/dL 9.1 9.4 9.1    Risks and benefits of anesthesia discussed at length, patient or surrogate demonstrates understanding. Appropriately NPO. Plan to proceed with anesthesia.  Champ Mungo, MD 02/01/21

## 2021-02-27 ENCOUNTER — Other Ambulatory Visit: Payer: Self-pay | Admitting: Physician Assistant

## 2021-02-27 DIAGNOSIS — I10 Essential (primary) hypertension: Secondary | ICD-10-CM

## 2021-02-27 DIAGNOSIS — E876 Hypokalemia: Secondary | ICD-10-CM

## 2021-03-19 ENCOUNTER — Encounter: Payer: Self-pay | Admitting: Emergency Medicine

## 2021-03-19 ENCOUNTER — Other Ambulatory Visit: Payer: Self-pay

## 2021-03-19 ENCOUNTER — Emergency Department: Payer: Medicare Other

## 2021-03-19 ENCOUNTER — Emergency Department
Admission: EM | Admit: 2021-03-19 | Discharge: 2021-03-19 | Disposition: A | Discharge status: Home / Self Care | Payer: Medicare Other | Attending: Emergency Medicine | Admitting: Emergency Medicine

## 2021-03-19 DIAGNOSIS — R55 Syncope and collapse: Secondary | ICD-10-CM | POA: Insufficient documentation

## 2021-03-19 DIAGNOSIS — E162 Hypoglycemia, unspecified: Secondary | ICD-10-CM | POA: Diagnosis not present

## 2021-03-19 DIAGNOSIS — I10 Essential (primary) hypertension: Secondary | ICD-10-CM | POA: Diagnosis not present

## 2021-03-19 DIAGNOSIS — E876 Hypokalemia: Secondary | ICD-10-CM | POA: Diagnosis not present

## 2021-03-19 DIAGNOSIS — R410 Disorientation, unspecified: Secondary | ICD-10-CM | POA: Diagnosis not present

## 2021-03-19 DIAGNOSIS — Z79899 Other long term (current) drug therapy: Secondary | ICD-10-CM | POA: Diagnosis not present

## 2021-03-19 DIAGNOSIS — W19XXXA Unspecified fall, initial encounter: Secondary | ICD-10-CM | POA: Diagnosis not present

## 2021-03-19 LAB — URINALYSIS, ROUTINE W REFLEX MICROSCOPIC
Bacteria, UA: NONE SEEN
Bilirubin Urine: NEGATIVE
Glucose, UA: NEGATIVE mg/dL
Hgb urine dipstick: NEGATIVE
Ketones, ur: 5 mg/dL — AB
Nitrite: NEGATIVE
Protein, ur: 30 mg/dL — AB
RBC / HPF: >50 RBC/hpf — ABNORMAL HIGH (ref 0–5)
Specific Gravity, Urine: 1.025 (ref 1.005–1.030)
pH: 5 (ref 5.0–8.0)

## 2021-03-19 LAB — BASIC METABOLIC PANEL
Anion gap: 11 (ref 5–15)
BUN: 28 mg/dL — ABNORMAL HIGH (ref 8–23)
CO2: 25 mmol/L (ref 22–32)
Calcium: 8.4 mg/dL — ABNORMAL LOW (ref 8.9–10.3)
Chloride: 106 mmol/L (ref 98–111)
Creatinine, Ser: 1.22 mg/dL — ABNORMAL HIGH (ref 0.44–1.00)
GFR, Estimated: 47 mL/min — ABNORMAL LOW (ref >60)
Glucose, Bld: 49 mg/dL — ABNORMAL LOW (ref 70–99)
Potassium: 3.3 mmol/L — ABNORMAL LOW (ref 3.5–5.1)
Sodium: 142 mmol/L (ref 135–145)

## 2021-03-19 LAB — CBC
HCT: 40.8 % (ref 36.0–46.0)
Hemoglobin: 13.1 g/dL (ref 12.0–15.0)
MCH: 27.5 pg (ref 26.0–34.0)
MCHC: 32.1 g/dL (ref 30.0–36.0)
MCV: 85.5 fL (ref 80.0–100.0)
Platelets: 202 10*3/uL (ref 150–400)
RBC: 4.77 MIL/uL (ref 3.87–5.11)
RDW: 15.7 % — ABNORMAL HIGH (ref 11.5–15.5)
WBC: 8.4 10*3/uL (ref 4.0–10.5)
nRBC: 0 % (ref 0.0–0.2)

## 2021-03-19 LAB — TROPONIN I (HIGH SENSITIVITY)
Troponin I (High Sensitivity): 3 ng/L (ref <18)
Troponin I (High Sensitivity): 3 ng/L (ref <18)

## 2021-03-19 IMAGING — CT CT HEAD W/O CM
3 series · 16 of 47 positions shown, 19 images · non-contrast
Comparison: None.

CLINICAL DATA: Pt arrived via ems from church where she had a
syncopal episode and hit her head on floor. Pt denies pain, no
obvious injury, pt denies taking blood thinners.

EXAM:
CT HEAD WITHOUT CONTRAST
TECHNIQUE: Contiguous axial images were obtained from the base of the skull
through the vertex without intravenous contrast.

[Series 2: head wo · axial · 0.40mm/px · z∈[+325,+450]mm · 10 of 30 slices shown, 13 images]
[im 3/30  brain]
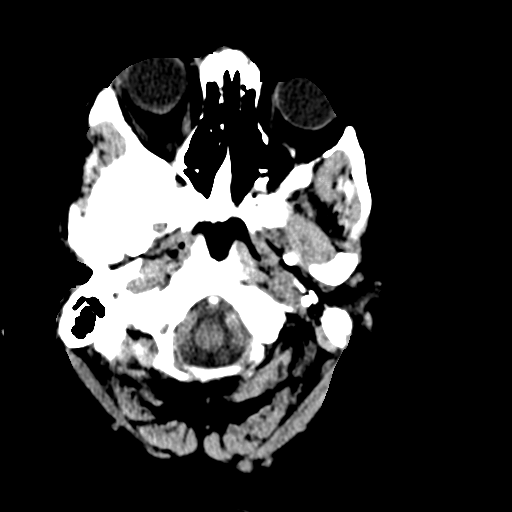
[im 3/30  bone]
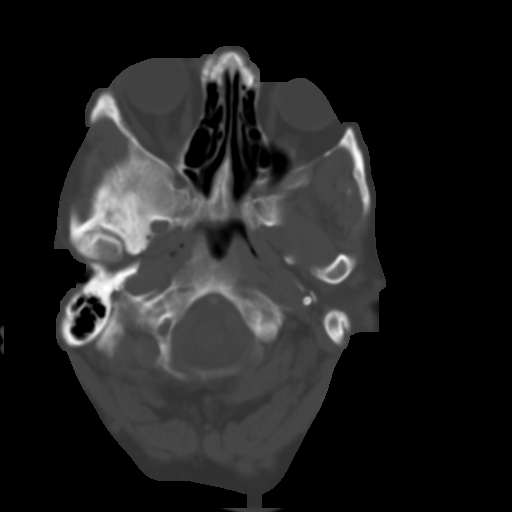
[im 6/30  brain]
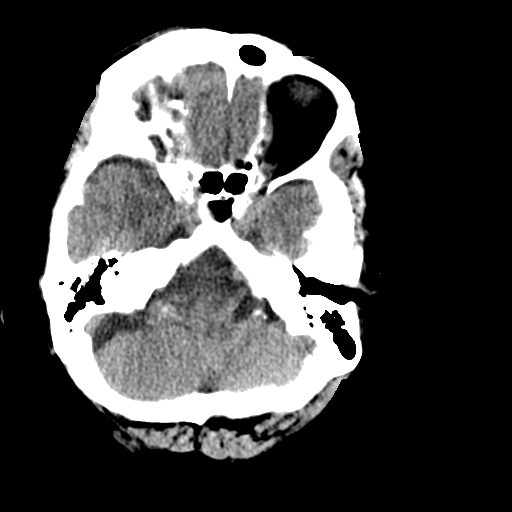
[im 9/30  brain]
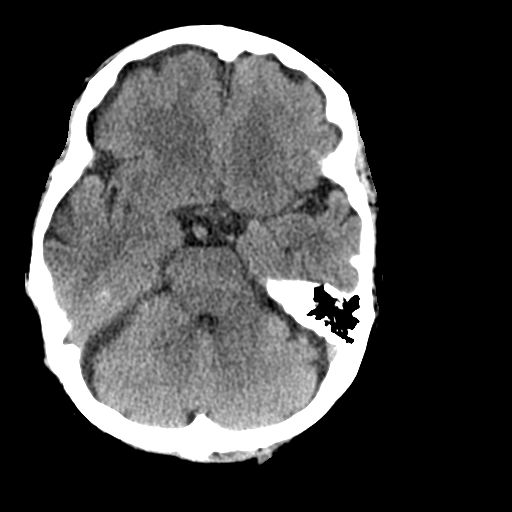
[im 11/30  brain]
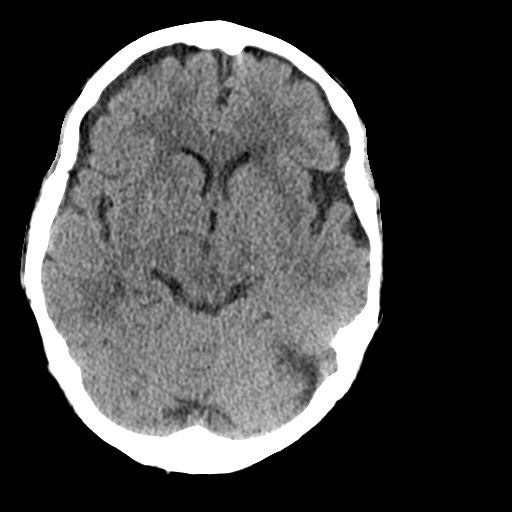
[im 14/30  brain]
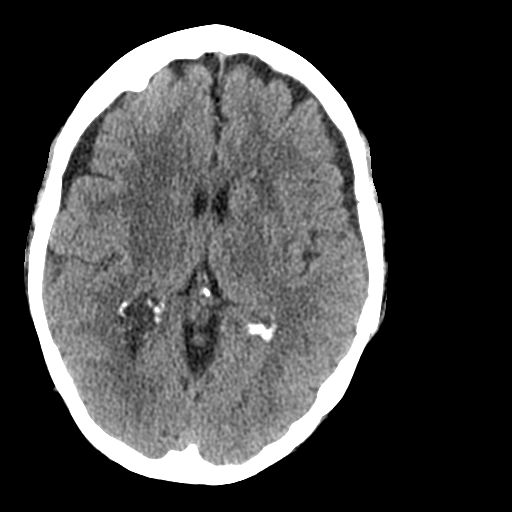
[im 14/30  bone]
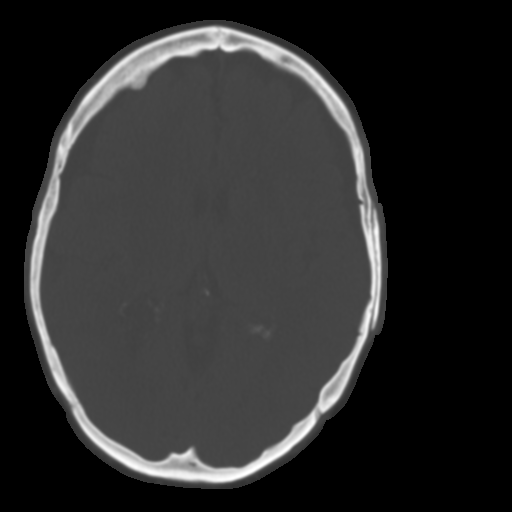
[im 17/30  brain]
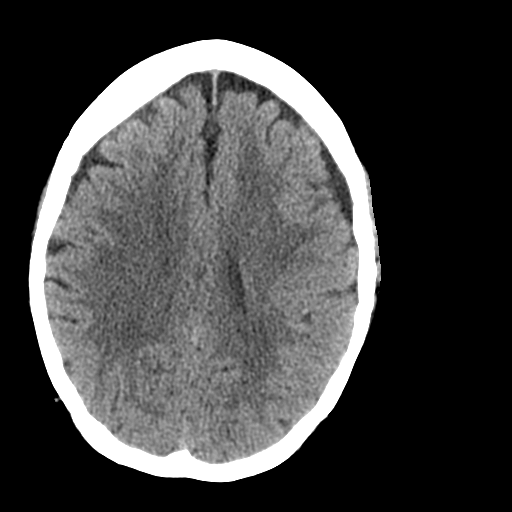
[im 20/30  brain]
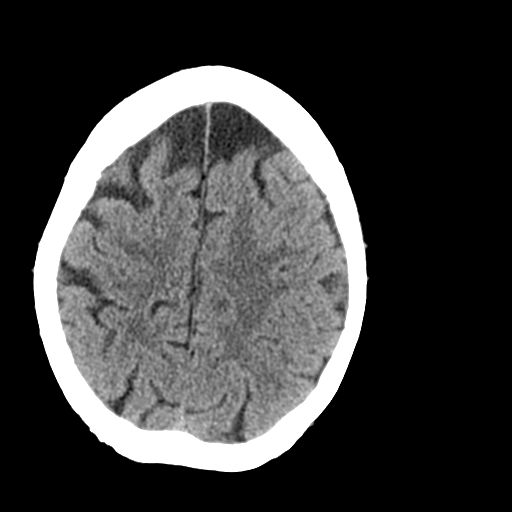
[im 23/30  brain]
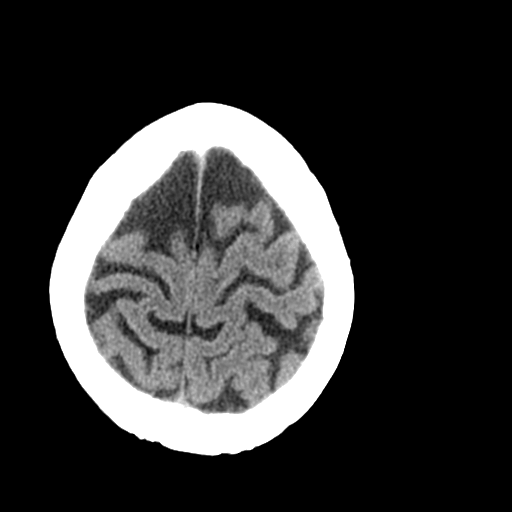
[im 25/30  brain]
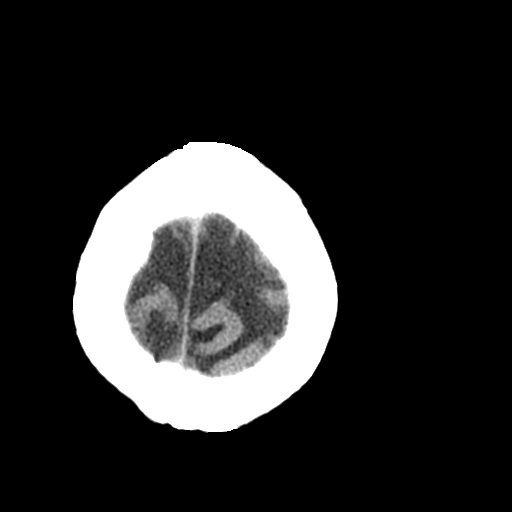
[im 25/30  bone]
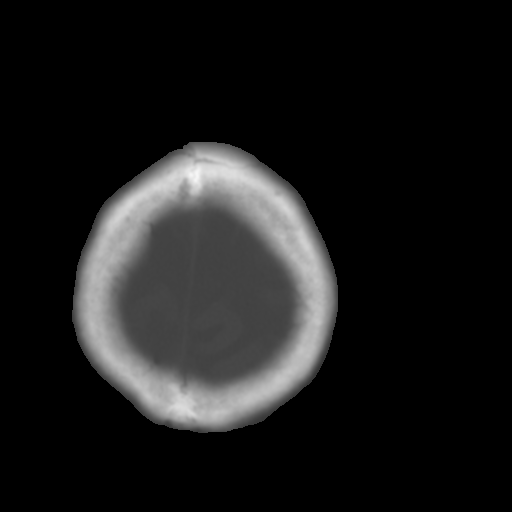
[im 28/30  brain]
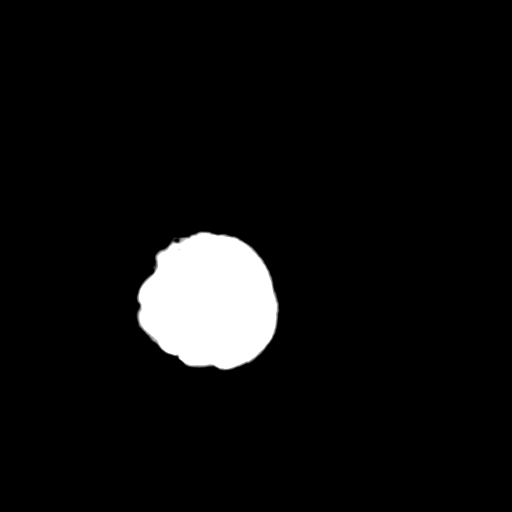

[Series 4: coronal soft tissue · coronal · 0.32mm/px · 3 of 64 slices shown]
[im 22/64  brain]
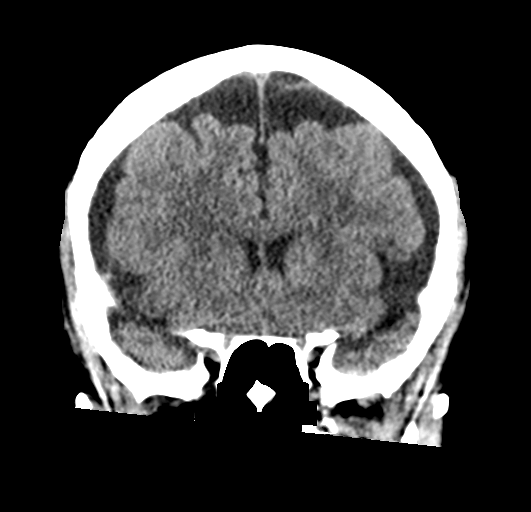
[im 29/64  brain]
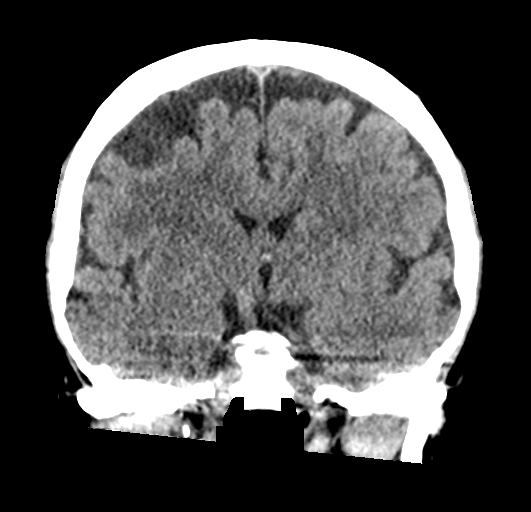
[im 36/64  brain]
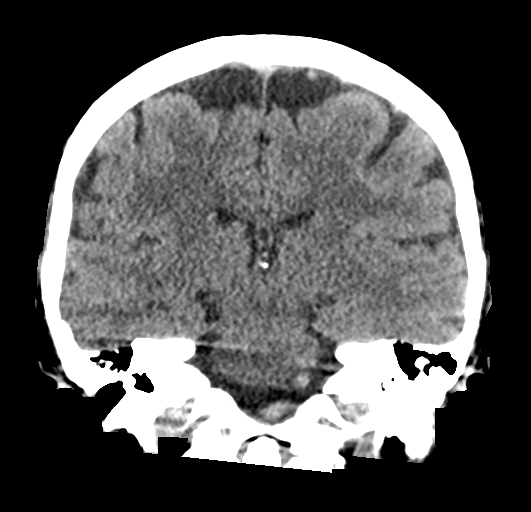

[Series 5: sagittal soft tissue · sagittal · 0.32mm/px · 3 of 58 slices shown]
[im 20/58  brain]
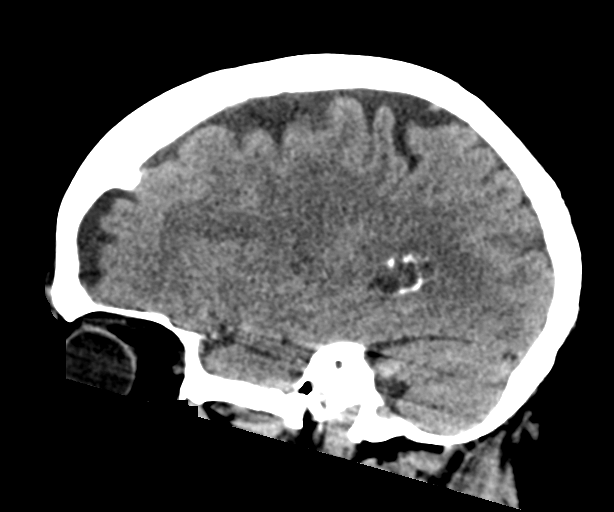
[im 29/58  brain]
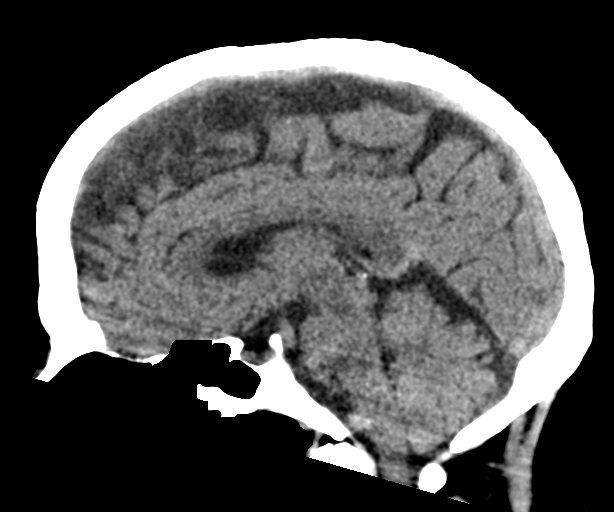
[im 39/58  brain]
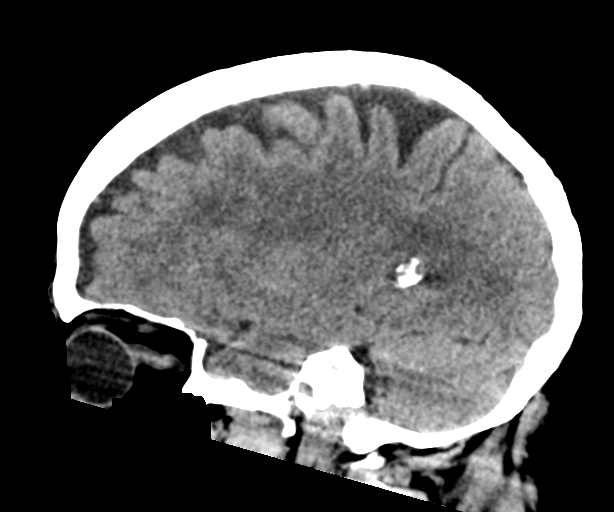

[16 of 47 positions shown; findings below may reference images not displayed]

BRAIN:
BRAIN
Patchy and confluent areas of decreased attenuation are noted
throughout the deep and periventricular white matter of the cerebral
hemispheres bilaterally, compatible with chronic microvascular
ischemic disease.

No evidence of large-territorial acute infarction. No parenchymal
hemorrhage. No mass lesion. No extra-axial collection.

No mass effect or midline shift. No hydrocephalus. Basilar cisterns
are patent.

Vascular: No hyperdense vessel.

Skull: No acute fracture or focal lesion.

Sinuses/Orbits: Paranasal sinuses and mastoid air cells are clear.
The orbits are unremarkable.

Other: None.
IMPRESSION: No acute intracranial abnormality.

## 2021-03-19 MED ORDER — POTASSIUM CHLORIDE CRYS ER 20 MEQ PO TBCR
40.0000 meq | EXTENDED_RELEASE_TABLET | Freq: Once | ORAL | Status: AC
  Administered 2021-03-19: 40 meq via ORAL
  Filled 2021-03-19: qty 2

## 2021-03-19 NOTE — ED Triage Notes (Signed)
Pt reports was sitting st [employer], felt nauseated and hot and fell over forward. Pt reports bystanders told her she passed out but she does not remember. Pt denies pain.

## 2021-03-19 NOTE — Progress Notes (Signed)
Chaplain Maggie made an initial visit with pt in ED after meeting her pastor and his wife in the hallway earlier in the day. Social and spiritual support centered in conversation was offered. Pt has support of her church family to accompany her. She was open to visiting and expressed appreciation for the care.

## 2021-03-19 NOTE — ED Provider Notes (Signed)
[facility] Emergency Department Provider Note  ____________________________________________   Event Date/Time   First MD Initiated Contact with Patient 03/19/21 1505     (approximate)  I have reviewed the triage vital signs and the nursing notes.   HISTORY  Chief Complaint Fall and Loss of Consciousness    HPI Jessica Little is a 75 y.o. female with hypertension, hyperlipidemia who comes in with concerns for syncopal episode and hit her head on the floor.  Patient denies any blood thinners.  Patient states that she was at church and she was feeling very hot.  She is waving herself down with a fan.  She started feeling a little queasy in her stomach and then she went out.  She fell forward and hit her head.  She does not remember hitting her head.  It is reported that some people at the church thought she had a little bit of shaking but does not sound like she had a postictal period but for maybe a minute or 2 she was slightly confused when she first woke up.  She denies any chest pain, shortness of breath, leg swelling, back pain or any other concerns.  She states that she feels at her baseline self and she is ready to go home at this time          Past Medical History:  Diagnosis Date   Anxiety    GERD (gastroesophageal reflux disease)    Hyperlipidemia    Hypertension     Patient Active Problem List   Diagnosis Date Noted   Gastroesophageal reflux disease with esophagitis without hemorrhage 05/23/2020   Hypokalemia 05/23/2020   GAD (generalized anxiety disorder) 05/25/2018   Osteopenia 10/10/2016   H/O urinary frequency 07/06/2015   Schizoaffective disorder, depressive type (HCC) 02/03/2015   HLD (hyperlipidemia) 02/03/2015   BP (high blood pressure) 02/03/2015   Cannot sleep 02/03/2015   Thyroid nodule 02/03/2015    Past Surgical History:  Procedure Laterality Date   CATARACT EXTRACTION W/PHACO Left 01/18/2021   Procedure: CATARACT  EXTRACTION PHACO AND INTRAOCULAR LENS PLACEMENT (IOC) LEFT 7.40 01:11.7;  Surgeon: Lockie Mola, MD;  Location: [facility];  Service: Ophthalmology;  Laterality: Left;   CATARACT EXTRACTION W/PHACO Right 02/01/2021   Procedure: CATARACT EXTRACTION PHACO AND INTRAOCULAR LENS PLACEMENT (IOC) RIGHT EYHANCE TORIC 8.60 01:09.9 ;  Surgeon: Lockie Mola, MD;  Location: [facility];  Service: Ophthalmology;  Laterality: Right;   CHOLECYSTECTOMY     COLONOSCOPY  01/01/2006   COLONOSCOPY WITH PROPOFOL N/A 08/23/2015   Procedure: COLONOSCOPY WITH PROPOFOL;  Surgeon: Earline Mayotte, MD;  Location: [facility];  Service: Endoscopy;  Laterality: N/A;   HIATAL HERNIA REPAIR  2019    Prior to Admission medications   Medication Sig Start Date End Date Taking? Authorizing Provider  atenolol (TENORMIN) 50 MG tablet Take 1 tablet by mouth once daily 02/28/21   Merita Norton T, FNP  Biotin 24268 MCG TABS Take by mouth.    [provider]  calcium carbonate (OS-CAL) 600 MG TABS tablet Take 600 mg by mouth daily with breakfast.    [provider]  Cholecalciferol (VITAMIN D3 PO) Take by mouth daily.    [provider]  Flaxseed, Linseed, (FLAXSEED OIL) 1000 MG CAPS Take by mouth.    [provider]  lisinopril (ZESTRIL) 10 MG tablet Take 1 tablet (10 mg total) by mouth daily. 12/23/20   Jacky Kindle, FNP  Magnesium 400 MG CAPS Take by  mouth.    [provider]  nortriptyline (PAMELOR) 50 MG capsule Take 1 capsule (50 mg total) by mouth at bedtime. 07/04/20   Cottle, Steva Ready., MD  omeprazole (PRILOSEC) 40 MG capsule Take 1 capsule (40 mg total) by mouth 2 (two) times daily. 05/23/20   Margaretann Loveless, PA-C  perphenazine (TRILAFON) 4 MG tablet TAKE 1 TABLET BY MOUTH AT BEDTIME 07/04/20   Cottle, Steva Ready., MD  potassium chloride (KLOR-CON) 10 MEQ tablet Take 1 tablet by mouth once daily 02/28/21   Jacky Kindle, FNP  simvastatin  (ZOCOR) 40 MG tablet Take 1 tablet (40 mg total) by mouth at bedtime. 01/16/21   Jacky Kindle, FNP  vitamin E 1000 UNIT capsule Take 1,000 Units by mouth daily.    [provider]  Zinc Sulfate (ZINC 15 PO) Take by mouth.    [provider]    Allergies Patient has no known allergies.  Family History  Problem Relation Age of Onset   Arrhythmia Mother    Heart attack Father    Stroke Father    Healthy Sister    Healthy Brother    Healthy Sister    Breast cancer Neg Hx     Social History Social History   Tobacco Use   Smoking status: Never   Smokeless tobacco: Never  Vaping Use   Vaping Use: Never used  Substance Use Topics   Alcohol use: No   Drug use: No      Review of Systems Constitutional: No fever/chills syncopal episode Eyes: No visual changes. ENT: No sore throat. Cardiovascular: Denies chest pain. Respiratory: Denies shortness of breath. Gastrointestinal: No abdominal pain.  No nausea, no vomiting.  No diarrhea.  No constipation. Genitourinary: Negative for dysuria. Musculoskeletal: Negative for back pain. Skin: Negative for rash. Neurological: Negative for headaches, focal weakness or numbness.  Head trauma All other ROS negative ____________________________________________   PHYSICAL EXAM:  VITAL SIGNS: ED Triage Vitals  Enc Vitals Group     BP 03/19/21 1221 112/76     Pulse Rate 03/19/21 1221 64     Resp 03/19/21 1221 20     Temp 03/19/21 1221 98.1 F (36.7 C)     Temp Source 03/19/21 1221 Oral     SpO2 03/19/21 1221 97 %     Weight 03/19/21 1219 165 lb 5.5 oz (75 kg)     Height 03/19/21 1219 5\' 6"  (1.676 m)     Head Circumference --      Peak Flow --      Pain Score 03/19/21 1219 1     Pain Loc --      Pain Edu? --      Excl. in GC? --     Constitutional: Alert and oriented. Well appearing and in no acute distress. Eyes: Conjunctivae are normal. EOMI. Head: No obvious hematoma noted Nose: No  congestion/rhinnorhea. Mouth/Throat: Mucous membranes are moist.   Neck: No stridor. Trachea Midline. FROM.  Denies any cervical spine tenderness.  Full range of motion of neck without any numbness or tingling down her arms or legs. Cardiovascular: Normal rate, regular rhythm. Grossly normal heart sounds.  Good peripheral circulation. Respiratory: Normal respiratory effort.  No retractions. Lungs CTAB. Gastrointestinal: Soft and nontender. No distention. No abdominal bruits.  Musculoskeletal: No lower extremity tenderness nor edema.  No joint effusions. Neurologic:  Normal speech and language. No gross focal neurologic deficits are appreciated.  Skin:  Skin is warm, dry and intact.  No rash noted. Psychiatric: Mood and affect are normal. Speech and behavior are normal. GU: Deferred   ____________________________________________   LABS (all labs ordered are listed, but only abnormal results are displayed)  Labs Reviewed  BASIC METABOLIC PANEL - Abnormal; Notable for the following components:      Result Value   Potassium 3.3 (*)    Glucose, Bld 49 (*)    BUN 28 (*)    Creatinine, Ser 1.22 (*)    Calcium 8.4 (*)    GFR, Estimated 47 (*)    All other components within normal limits  CBC - Abnormal; Notable for the following components:   RDW 15.7 (*)    All other components within normal limits  URINALYSIS, ROUTINE W REFLEX MICROSCOPIC - Abnormal; Notable for the following components:   Color, Urine YELLOW (*)    APPearance CLOUDY (*)    Ketones, ur 5 (*)    Protein, ur 30 (*)    Leukocytes,Ua SMALL (*)    RBC / HPF >50 (*)    All other components within normal limits  CBG MONITORING, ED   ____________________________________________   ED ECG REPORT I, Concha Se, the attending physician, personally viewed and interpreted this ECG.  Normal sinus rate of 60, no ST elevation, no T wave inversions, normal  intervals ____________________________________________  RADIOLOGY  Official radiology report(s): CT Head Wo Contrast  Result Date: 03/19/2021 CLINICAL DATA:  Pt arrived via ems from church where she had a syncopal episode and hit her head on floor. Pt denies pain, no obvious injury, pt denies taking blood thinners. EXAM: CT HEAD WITHOUT CONTRAST TECHNIQUE: Contiguous axial images were obtained from the base of the skull through the vertex without intravenous contrast. COMPARISON:  None. BRAIN: BRAIN Patchy and confluent areas of decreased attenuation are noted throughout the deep and periventricular white matter of the cerebral hemispheres bilaterally, compatible with chronic microvascular ischemic disease. No evidence of large-territorial acute infarction. No parenchymal hemorrhage. No mass lesion. No extra-axial collection. No mass effect or midline shift. No hydrocephalus. Basilar cisterns are patent. Vascular: No hyperdense vessel. Skull: No acute fracture or focal lesion. Sinuses/Orbits: Paranasal sinuses and mastoid air cells are clear. The orbits are unremarkable. Other: None. IMPRESSION: No acute intracranial abnormality. Electronically Signed   By: Tish Frederickson M.D.   On: 03/19/2021 16:04    ____________________________________________   PROCEDURES  Procedure(s) performed (including Critical Care):  Procedures   ____________________________________________   INITIAL IMPRESSION / ASSESSMENT AND PLAN / ED COURSE  Jessica Little was evaluated in Emergency Department on 03/19/2021 for the symptoms described in the history of present illness. She was evaluated in the context of the global COVID-19 pandemic, which necessitated consideration that the patient might be at risk for infection with the SARS-CoV-2 virus that causes COVID-19. Institutional protocols and algorithms that pertain to the evaluation of patients at risk for COVID-19 are in a state of rapid change based on  information released by regulatory bodies including the CDC and federal and state organizations. These policies and algorithms were followed during the patient's care in the ED.    Patient comes in for a syncopal episode most likely vasovagal in nature secondary to being overheated at church however will get cardiac markers to look for any evidence of arrhythmia, EKG to evaluate for arrhythmia, labs to evaluate for any dehydration, anemia.  Will get CT head evaluate for intercranial hemorrhage given hit head.  Unclear exactly what the shaking episode was but sounds more  likely syncopal with convulsion rather than a seizure given no significant postictal period.  However given she hit her head we will get a CT head just to make sure there is no mass that would cause seizure.  Patient has a lot of RBCs in her urine with 10-20 WBCs.  Patient denies any symptoms of urinary tract infection.  We will send for culture in case she develops any symptoms.  Discussed with her following up with urology for the RBCs in it but she denies any kidney stones and has no flank pain.  Labs are reassuring with no white count elevation to suggest severe infection.  The kidney function is stable, potassium slightly low will give some oral repletion  Patient cardiac markers are negative x2.  We discussed different options including observation admission for echocardiogram, cardiac monitoring versus going home.  Patient is adamant that she would like to go home.  She states that she continues to feel her baseline self and will follow-up outpatient with cardiology.  She understands that if she develops a another episode she needs to return to the ER immediately  I discussed the provisional nature of ED diagnosis, the treatment so far, the ongoing plan of care, follow up appointments and return precautions with the patient and any family or support people present. They expressed understanding and agreed with the plan, discharged  home.            ____________________________________________   FINAL CLINICAL IMPRESSION(S) / ED DIAGNOSES   Final diagnoses:  Vasovagal syncope  Hypokalemia      MEDICATIONS GIVEN DURING THIS VISIT:  Medications  potassium chloride SA (KLOR-CON) CR tablet 40 mEq (40 mEq Oral Given 03/19/21 1758)     ED Discharge Orders     None        Note:  This document was prepared using Dragon voice recognition software and may include unintentional dictation errors.    Concha Se, MD 03/19/21 [phone]

## 2021-03-19 NOTE — Discharge Instructions (Addendum)
Follow-up with cardiology to discuss your syncopal episode.  Please call them to make an appointment.  You had some RBCs noted in your urine and can follow-up with urology for this.  Return to the ER if you develop recurrent symptoms or any other concern

## 2021-03-19 NOTE — ED Triage Notes (Signed)
Pt arrived via ems from church where she had a syncopal episode and hit her head on floor. Pt denies pain, no obvious injury, pt denies taking blood thinners. EMS reports pt a&o x 4 on arrival.  Hx of hypertension. Took medication this morning.    72 cbg 98.5 Temp 80 Hr 96% 133/80

## 2021-03-20 LAB — URINE CULTURE

## 2021-03-27 ENCOUNTER — Ambulatory Visit (INDEPENDENT_AMBULATORY_CARE_PROVIDER_SITE_OTHER): Payer: Medicare Other | Admitting: Cardiology

## 2021-03-27 ENCOUNTER — Other Ambulatory Visit: Payer: Self-pay

## 2021-03-27 ENCOUNTER — Encounter: Payer: Self-pay | Admitting: Cardiology

## 2021-03-27 ENCOUNTER — Ambulatory Visit: Payer: Medicare Other | Admitting: Family Medicine

## 2021-03-27 ENCOUNTER — Ambulatory Visit (INDEPENDENT_AMBULATORY_CARE_PROVIDER_SITE_OTHER): Payer: Medicare Other

## 2021-03-27 VITALS — BP 148/100 | HR 65 | Ht 66.0 in | Wt 167.1 lb

## 2021-03-27 DIAGNOSIS — I10 Essential (primary) hypertension: Secondary | ICD-10-CM

## 2021-03-27 DIAGNOSIS — R55 Syncope and collapse: Secondary | ICD-10-CM

## 2021-03-27 NOTE — Progress Notes (Signed)
Cardiology Office Note:    Date:  03/27/2021   ID:  Jessica Little, DOB 30-Oct-1945, MRN [id number]  PCP:  Jacky Kindle, FNP   [facility] Providers Cardiologist:  None     Referring MD: Jacky Kindle, FNP   Chief Complaint  Patient presents with   OTHER    F/u ED Syncope. Meds reviewed verbally with pt.    History of Present Illness:    Jessica Little is a 76 y.o. female with a hx of hypertension, hyperlipidemia, who presents due to syncope.  Patient passed out about a week ago while at church.  She states feeling fine in the morning, went to church.  While at church, she suddenly felt nauseous, felt flushed and wall.  She started fanning herself, the next thing she knew she was on the ground.  She does not remember much, but was told she was shaking when she was on the ground.  The firefighter was present, called EMS and encouraged patient to get checked out in the hospital.  She denies any prior episodes.  States he sometimes gets dizzy when she rises up too quickly from a seated position.  Has not had any episodes since.  She was taken to the ED, work-up with head CT, was unrevealing.  Potassium was low at 3.3, supplement started.  Past Medical History:  Diagnosis Date   Anxiety    GERD (gastroesophageal reflux disease)    Hyperlipidemia    Hypertension     Past Surgical History:  Procedure Laterality Date   CATARACT EXTRACTION W/PHACO Left 01/18/2021   Procedure: CATARACT EXTRACTION PHACO AND INTRAOCULAR LENS PLACEMENT (IOC) LEFT 7.40 01:11.7;  Surgeon: Lockie Mola, MD;  Location: [facility];  Service: Ophthalmology;  Laterality: Left;   CATARACT EXTRACTION W/PHACO Right 02/01/2021   Procedure: CATARACT EXTRACTION PHACO AND INTRAOCULAR LENS PLACEMENT (IOC) RIGHT EYHANCE TORIC 8.60 01:09.9 ;  Surgeon: Lockie Mola, MD;  Location: [facility];  Service: Ophthalmology;  Laterality: Right;   CHOLECYSTECTOMY     COLONOSCOPY  01/01/2006    COLONOSCOPY WITH PROPOFOL N/A 08/23/2015   Procedure: COLONOSCOPY WITH PROPOFOL;  Surgeon: Earline Mayotte, MD;  Location: [facility];  Service: Endoscopy;  Laterality: N/A;   HIATAL HERNIA REPAIR  2019    Current Medications: Current Meds  Medication Sig   atenolol (TENORMIN) 50 MG tablet Take 1 tablet by mouth once daily   Biotin 08657 MCG TABS Take by mouth.   calcium carbonate (OS-CAL) 600 MG TABS tablet Take 600 mg by mouth daily with breakfast.   Cholecalciferol (VITAMIN D3 PO) Take by mouth daily.   Flaxseed, Linseed, (FLAXSEED OIL) 1000 MG CAPS Take by mouth.   lisinopril (ZESTRIL) 10 MG tablet Take 1 tablet (10 mg total) by mouth daily.   Magnesium 400 MG CAPS Take by mouth.   NON FORMULARY Probiotic daily.   nortriptyline (PAMELOR) 50 MG capsule Take 1 capsule (50 mg total) by mouth at bedtime.   omeprazole (PRILOSEC) 40 MG capsule Take 1 capsule (40 mg total) by mouth 2 (two) times daily.   perphenazine (TRILAFON) 4 MG tablet TAKE 1 TABLET BY MOUTH AT BEDTIME   potassium chloride (KLOR-CON) 10 MEQ tablet Take 1 tablet by mouth once daily   simvastatin (ZOCOR) 40 MG tablet Take 1 tablet (40 mg total) by mouth at bedtime.   vitamin E 1000 UNIT capsule Take 1,000 Units by mouth daily.   Zinc Sulfate (ZINC 15 PO) Take by mouth.  Allergies:   Patient has no known allergies.   Social History   Socioeconomic History   Marital status: Widowed    Spouse name: Not on file   Number of children: 0   Years of education: College   Highest education level: Bachelor's degree (e.g., BA, AB, BS)  Occupational History   Occupation: Retired  Tobacco Use   Smoking status: Never   Smokeless tobacco: Never  Vaping Use   Vaping Use: Never used  Substance and Sexual Activity   Alcohol use: No   Drug use: No   Sexual activity: Not on file  Other Topics Concern   Not on file  Social History Narrative   Not on file   Social Determinants of Health   Financial Resource  Strain: Low Risk    Difficulty of Paying Living Expenses: Not hard at all  Food Insecurity: No Food Insecurity   Worried About Programme researcher, broadcasting/film/video in the Last Year: Never true   Ran Out of Food in the Last Year: Never true  Transportation Needs: No Transportation Needs   Lack of Transportation (Medical): No   Lack of Transportation (Non-Medical): No  Physical Activity: Insufficiently Active   Days of Exercise per Week: 2 days   Minutes of Exercise per Session: 40 min  Stress: No Stress Concern Present   Feeling of Stress : Not at all  Social Connections: Moderately Isolated   Frequency of Communication with Friends and Family: More than three times a week   Frequency of Social Gatherings with Friends and Family: More than three times a week   Attends Religious Services: More than 4 times per year   Active Member of [employer] or Organizations: No   Attends Banker Meetings: Never   Marital Status: Widowed     Family History: The patient's family history includes Arrhythmia in her mother; Healthy in her brother, sister, and sister; Heart attack in her father; Stroke in her father. There is no history of Breast cancer.  ROS:   Please see the history of present illness.     All other systems reviewed and are negative.  EKGs/Labs/Other Studies Reviewed:    The following studies were reviewed today:   EKG:  EKG is  ordered today.    The ekg ordered 03/19/2021 demonstrates normal sinus rhythm.  Recent Labs: 05/23/2020: TSH 2.300 09/01/2020: ALT 19 03/19/2021: BUN 28; Creatinine, Ser 1.22; Hemoglobin 13.1; Platelets 202; Potassium 3.3; Sodium 142  Recent Lipid Panel    Component Value Date/Time   CHOL 192 05/23/2020 1434   TRIG 160 (H) 05/23/2020 1434   HDL 57 05/23/2020 1434   CHOLHDL 3.7 02/25/2019 1037   LDLCALC 107 (H) 05/23/2020 1434     Risk Assessment/Calculations:          Physical Exam:    VS:  BP (!) 148/100 (BP Location: Right Arm, Patient  Position: Sitting, Cuff Size: Normal)   Pulse 65   Ht 5\' 6"  (1.676 m)   Wt 167 lb 2 oz (75.8 kg)   SpO2 95%   BMI 26.97 kg/m     Wt Readings from Last 3 Encounters:  03/27/21 167 lb 2 oz (75.8 kg)  03/19/21 165 lb 5.5 oz (75 kg)  02/01/21 165 lb (74.8 kg)     GEN:  Well nourished, well developed in no acute distress HEENT: Normal NECK: No JVD; No carotid bruits LYMPHATICS: No lymphadenopathy CARDIAC: RRR, no murmurs, rubs, gallops RESPIRATORY:  Clear to auscultation without rales, wheezing  or rhonchi  ABDOMEN: Soft, non-tender, non-distended MUSCULOSKELETAL:  No edema; No deformity  SKIN: Warm and dry NEUROLOGIC:  Alert and oriented x 3 PSYCHIATRIC:  Normal affect   ASSESSMENT:    1. Syncope and collapse   2. Primary hypertension    PLAN:    In order of problems listed above:  Syncope, appears vasovagal with prodrome of nausea, flushing.  States having occasional dizziness upon standing from seated position, orthostatic vitals with no evidence for orthostasis.  Place cardiac monitor, obtain echocardiogram for syncopal work-up.  Consider neurology eval due to patient describing shaking upon passing out?  Seizure activity. Hypertension, BP reasonable for age, continue current meds for now.  Follow-up after echo and cardiac monitor.     Medication Adjustments/Labs and Tests Ordered: Current medicines are reviewed at length with the patient today.  Concerns regarding medicines are outlined above.  Orders Placed This Encounter  Procedures   LONG TERM MONITOR (3-14 DAYS)   ECHOCARDIOGRAM COMPLETE   No orders of the defined types were placed in this encounter.   Patient Instructions  Medication Instructions:   Your physician recommends that you continue on your current medications as directed. Please refer to the Current Medication list given to you today.  *If you need a refill on your cardiac medications before your next appointment, please call your  pharmacy*   Lab Work:  None Ordered  If you have labs (blood work) drawn today and your tests are completely normal, you will receive your results only by: MyChart Message (if you have MyChart) OR A paper copy in the mail If you have any lab test that is abnormal or we need to change your treatment, we will call you to review the results.   Testing/Procedures:  Echocardiogram Please return to [facility] on ______________ at _______________ AM/PM for an Echocardiogram. Your physician has requested that you have an echocardiogram. Echocardiography is a painless test that uses sound waves to create images of your heart. It provides your doctor with information about the size and shape of your heart and how well your heart's chambers and valves are working. This procedure takes approximately one hour. There are no restrictions for this procedure. Please note; depending on visual quality an IV may need to be placed.   2. Your physician has recommended that you wear a Zio monitor.   This monitor is a medical device that records the heart's electrical activity. Doctors most often use these monitors to diagnose arrhythmias. Arrhythmias are problems with the speed or rhythm of the heartbeat. The monitor is a small device applied to your chest. You can wear one while you do your normal daily activities. While wearing this monitor if you have any symptoms to push the button and record what you felt. Once you have worn this monitor for the period of time provider prescribed (Usually 14 days), you will return the monitor device in the postage paid box. Once it is returned they will download the data collected and provide [place] with a report which the provider will then review and we will call you with those results. Important tips:  Avoid showering during the first 24 hours of wearing the monitor. Avoid excessive sweating to help maximize wear time. Do not submerge the device, no hot tubs,  and no swimming pools. Keep any lotions or oils away from the patch. After 24 hours you may shower with the patch on. Take brief showers with your back facing the shower head.  Do  not remove patch once it has been placed because that will interrupt data and decrease adhesive wear time. Push the button when you have any symptoms and write down what you were feeling. Once you have completed wearing your monitor, remove and place into box which has postage paid and place in your outgoing mailbox.  If for some reason you have misplaced your box then call our office and we can provide another box and/or mail it off for you.  Follow-Up: At [facility], you and your health needs are our priority.  As part of our continuing mission to provide you with exceptional heart care, we have created designated Provider Care Teams.  These Care Teams include your primary Cardiologist (physician) and Advanced Practice Providers (APPs -  Physician Assistants and Nurse Practitioners) who all work together to provide you with the care you need, when you need it.  We recommend signing up for the patient portal called "MyChart".  Sign up information is provided on this After Visit Summary.  MyChart is used to connect with patients for Virtual Visits (Telemedicine).  Patients are able to view lab/test results, encounter notes, upcoming appointments, etc.  Non-urgent messages can be sent to your provider as well.   To learn more about what you can do with MyChart, go to [email or web address].    Your next appointment:   After Testing   The format for your next appointment:   In Person  Provider:   You may see Dr. Azucena Cecil  or one of the following Advanced Practice Providers on your designated Care Team:   Nicolasa Ducking, NP Eula Listen, PA-C Marisue Ivan, PA-C Cadence [place], [place]    Signed, Debbe Odea, MD  03/27/2021 12:34 PM    [facility]

## 2021-03-27 NOTE — Patient Instructions (Signed)
Medication Instructions:   Your physician recommends that you continue on your current medications as directed. Please refer to the Current Medication list given to you today.  *If you need a refill on your cardiac medications before your next appointment, please call your pharmacy*   Lab Work:  None Ordered  If you have labs (blood work) drawn today and your tests are completely normal, you will receive your results only by: MyChart Message (if you have MyChart) OR A paper copy in the mail If you have any lab test that is abnormal or we need to change your treatment, we will call you to review the results.   Testing/Procedures:  Echocardiogram Please return to [facility] on ______________ at _______________ AM/PM for an Echocardiogram. Your physician has requested that you have an echocardiogram. Echocardiography is a painless test that uses sound waves to create images of your heart. It provides your doctor with information about the size and shape of your heart and how well your heart's chambers and valves are working. This procedure takes approximately one hour. There are no restrictions for this procedure. Please note; depending on visual quality an IV may need to be placed.   2. Your physician has recommended that you wear a Zio monitor.   This monitor is a medical device that records the heart's electrical activity. Doctors most often use these monitors to diagnose arrhythmias. Arrhythmias are problems with the speed or rhythm of the heartbeat. The monitor is a small device applied to your chest. You can wear one while you do your normal daily activities. While wearing this monitor if you have any symptoms to push the button and record what you felt. Once you have worn this monitor for the period of time provider prescribed (Usually 14 days), you will return the monitor device in the postage paid box. Once it is returned they will download the data collected and provide  [place] with a report which the provider will then review and we will call you with those results. Important tips:  Avoid showering during the first 24 hours of wearing the monitor. Avoid excessive sweating to help maximize wear time. Do not submerge the device, no hot tubs, and no swimming pools. Keep any lotions or oils away from the patch. After 24 hours you may shower with the patch on. Take brief showers with your back facing the shower head.  Do not remove patch once it has been placed because that will interrupt data and decrease adhesive wear time. Push the button when you have any symptoms and write down what you were feeling. Once you have completed wearing your monitor, remove and place into box which has postage paid and place in your outgoing mailbox.  If for some reason you have misplaced your box then call our office and we can provide another box and/or mail it off for you.  Follow-Up: At [facility], you and your health needs are our priority.  As part of our continuing mission to provide you with exceptional heart care, we have created designated Provider Care Teams.  These Care Teams include your primary Cardiologist (physician) and Advanced Practice Providers (APPs -  Physician Assistants and Nurse Practitioners) who all work together to provide you with the care you need, when you need it.  We recommend signing up for the patient portal called "MyChart".  Sign up information is provided on this After Visit Summary.  MyChart is used to connect with patients for Virtual Visits (Telemedicine).  Patients are able to view lab/test results, encounter notes, upcoming appointments, etc.  Non-urgent messages can be sent to your provider as well.   To learn more about what you can do with MyChart, go to [email or web address].    Your next appointment:   After Testing   The format for your next appointment:   In Person  Provider:   You may see Dr. Azucena Cecil  or one of the  following Advanced Practice Providers on your designated Care Team:   Nicolasa Ducking, NP Eula Listen, PA-C Marisue Ivan, PA-C Cadence [place], [place]

## 2021-03-28 ENCOUNTER — Ambulatory Visit (INDEPENDENT_AMBULATORY_CARE_PROVIDER_SITE_OTHER): Payer: Medicare Other | Admitting: Family Medicine

## 2021-03-28 ENCOUNTER — Encounter: Payer: Self-pay | Admitting: Family Medicine

## 2021-03-28 VITALS — BP 176/95 | HR 64 | Resp 15 | Wt 167.2 lb

## 2021-03-28 DIAGNOSIS — E162 Hypoglycemia, unspecified: Secondary | ICD-10-CM | POA: Diagnosis not present

## 2021-03-28 DIAGNOSIS — I10 Essential (primary) hypertension: Secondary | ICD-10-CM

## 2021-03-28 DIAGNOSIS — R55 Syncope and collapse: Secondary | ICD-10-CM

## 2021-03-28 DIAGNOSIS — E876 Hypokalemia: Secondary | ICD-10-CM | POA: Diagnosis not present

## 2021-03-28 DIAGNOSIS — R569 Unspecified convulsions: Secondary | ICD-10-CM | POA: Diagnosis not present

## 2021-03-28 NOTE — Assessment & Plan Note (Signed)
Poor recollection of events Witnessed event- at church, previously felt hot/flushed/was fanning herself

## 2021-03-28 NOTE — Assessment & Plan Note (Signed)
Chronic, unstable Elevated today Wearing ziopatch Applied by cards at OV 10/31 Continue medications Denies CP; no LE edema noted on exam

## 2021-03-28 NOTE — Assessment & Plan Note (Signed)
Previously noted on labs; repeat chemistry No known diabetes up to this point Pt reported eating breakfast prior to syncopal event at church

## 2021-03-28 NOTE — Assessment & Plan Note (Signed)
New referral placed to neurology for 'jerking' noted s/p syncope

## 2021-03-28 NOTE — Progress Notes (Signed)
Established patient visit   Patient: Jessica Little   DOB: Apr 26, 1946   75 y.o. Female  MRN: [id number] Visit Date: 03/28/2021  Today's healthcare provider: Jacky Kindle, FNP   Chief Complaint  Patient presents with   Hypertension   Hyperlipidemia   Gastroesophageal Reflux   Subjective    HPI  Hypertension, follow-up  BP Readings from Last 3 Encounters:  03/28/21 (!) 176/95  03/27/21 (!) 148/100  03/19/21 (!) 160/78   Wt Readings from Last 3 Encounters:  03/28/21 167 lb 3.2 oz (75.8 kg)  03/27/21 167 lb 2 oz (75.8 kg)  03/19/21 165 lb 5.5 oz (75 kg)     She was last seen for hypertension 7 months ago.  BP at that visit was 148/90. Management since that visit includes increase Linisonpril to 5mg .  She reports excellent compliance with treatment. She is not having side effects.  She is following a Regular diet. She is not exercising. She does not smoke.  Use of agents associated with hypertension: none.   Outside blood pressures are systolic 131-156 diastolic 76-82. Symptoms: No chest pain No chest pressure  No palpitations No syncope  No dyspnea No orthopnea  No paroxysmal nocturnal dyspnea No lower extremity edema   Pertinent labs: Lab Results  Component Value Date   CHOL 192 05/23/2020   HDL 57 05/23/2020   LDLCALC 107 (H) 05/23/2020   TRIG 160 (H) 05/23/2020   CHOLHDL 3.7 02/25/2019   Lab Results  Component Value Date   NA 142 03/19/2021   K 3.3 (L) 03/19/2021   CREATININE 1.22 (H) 03/19/2021   GFRNONAA 47 (L) 03/19/2021   GLUCOSE 49 (L) 03/19/2021   TSH 2.300 05/23/2020     The 10-year ASCVD risk score (Arnett DK, et al., 2019) is: 29.9%   ---------------------------------------------------------------------------------------------------  Lipid/Cholesterol, Follow-up  Last lipid panel Other pertinent labs  Lab Results  Component Value Date   CHOL 192 05/23/2020   HDL 57 05/23/2020   LDLCALC 107 (H) 05/23/2020   TRIG 160 (H)  05/23/2020   CHOLHDL 3.7 02/25/2019   Lab Results  Component Value Date   ALT 19 09/01/2020   AST 24 09/01/2020   PLT 202 03/19/2021   TSH 2.300 05/23/2020     She was last seen for this 11 months ago.  Management since that visit includes none.  She reports excellent compliance with treatment. She is not having side effects.   Symptoms: No chest pain No chest pressure/discomfort  No dyspnea No lower extremity edema  No numbness or tingling of extremity No orthopnea  No palpitations No paroxysmal nocturnal dyspnea  No speech difficulty No syncope   Current diet: well balanced Current exercise: no regular exercise  The 10-year ASCVD risk score (Arnett DK, et al., 2019) is: 29.9%  ---------------------------------------------------------------------------------------------------  GERD, Follow up:  The patient was last seen for GERD 11 months ago. Changes made since that visit include none.  She reports excellent compliance with treatment. She is not having side effects.  She IS experiencing  no symptoms  . She is NOT experiencing abdominal bloating, belching, chest pain, choking on food, deep pressure at base of neck, difficulty swallowing, early satiety, or heartburn  -----------------------------------------------------------------------------------------   Medications: Outpatient Medications Prior to Visit  Medication Sig   atenolol (TENORMIN) 50 MG tablet Take 1 tablet by mouth once daily   Biotin 2020 MCG TABS Take by mouth.   calcium carbonate (OS-CAL) 600 MG TABS tablet Take 600 mg  by mouth daily with breakfast.   Cholecalciferol (VITAMIN D3 PO) Take by mouth daily.   Flaxseed, Linseed, (FLAXSEED OIL) 1000 MG CAPS Take by mouth.   lisinopril (ZESTRIL) 10 MG tablet Take 1 tablet (10 mg total) by mouth daily.   Magnesium 400 MG CAPS Take by mouth.   NON FORMULARY Probiotic daily.   nortriptyline (PAMELOR) 50 MG capsule Take 1 capsule (50 mg total) by mouth at  bedtime.   omeprazole (PRILOSEC) 40 MG capsule Take 1 capsule (40 mg total) by mouth 2 (two) times daily.   perphenazine (TRILAFON) 4 MG tablet TAKE 1 TABLET BY MOUTH AT BEDTIME   potassium chloride (KLOR-CON) 10 MEQ tablet Take 1 tablet by mouth once daily   simvastatin (ZOCOR) 40 MG tablet Take 1 tablet (40 mg total) by mouth at bedtime.   vitamin E 1000 UNIT capsule Take 1,000 Units by mouth daily.   Zinc Sulfate (ZINC 15 PO) Take by mouth.   No facility-administered medications prior to visit.    Review of Systems     Objective    BP (!) 176/95   Pulse 64   Resp 15   Wt 167 lb 3.2 oz (75.8 kg)   SpO2 99%   BMI 26.99 kg/m  {Show previous vital signs (optional):23777}  Physical Exam Vitals and nursing note reviewed.  Constitutional:      General: She is not in acute distress.    Appearance: Normal appearance. She is overweight. She is not ill-appearing, toxic-appearing or diaphoretic.  HENT:     Head: Normocephalic and atraumatic.  Cardiovascular:     Rate and Rhythm: Normal rate and regular rhythm.     Pulses: Normal pulses.     Heart sounds: Normal heart sounds. No murmur heard.   No friction rub. No gallop.  Pulmonary:     Effort: Pulmonary effort is normal. No respiratory distress.     Breath sounds: Normal breath sounds. No stridor. No wheezing, rhonchi or rales.  Chest:     Chest wall: No tenderness.  Abdominal:     General: Bowel sounds are normal.     Palpations: Abdomen is soft.  Musculoskeletal:        General: No swelling, tenderness, deformity or signs of injury. Normal range of motion.     Right lower leg: No edema.     Left lower leg: No edema.  Skin:    General: Skin is warm and dry.     Capillary Refill: Capillary refill takes less than 2 seconds.     Coloration: Skin is not jaundiced or pale.     Findings: No bruising, erythema, lesion or rash.  Neurological:     General: No focal deficit present.     Mental Status: She is alert and oriented  to person, place, and time. Mental status is at baseline.     Cranial Nerves: No cranial nerve deficit.     Sensory: No sensory deficit.     Motor: No weakness.     Coordination: Coordination normal.  Psychiatric:        Mood and Affect: Mood normal.        Behavior: Behavior normal.        Thought Content: Thought content normal.        Judgment: Judgment normal.     No results found for any visits on 03/28/21.  Assessment & Plan     Problem List Items Addressed This Visit       Cardiovascular and Mediastinum  Primary hypertension    Chronic, unstable Elevated today Wearing ziopatch Applied by cards at OV 10/31 Continue medications Denies CP; no LE edema noted on exam        Endocrine   Hypoglycemia    Previously noted on labs; repeat chemistry No known diabetes up to this point Pt reported eating breakfast prior to syncopal event at church      Relevant Orders   Comprehensive metabolic panel     Other   Hypokalemia    Previously hypokalemic On PO supplement Eating potassium rich diet per pt report Repeat chemistry      Relevant Orders   Comprehensive metabolic panel   Syncope and collapse - Primary    Poor recollection of events Witnessed event- at church, previously felt hot/flushed/was fanning herself      Relevant Orders   Ambulatory referral to Neurology   Seizure-like activity [facility])    New referral placed to neurology for 'jerking' noted s/p syncope      Relevant Orders   Ambulatory referral to Neurology     Return in about 3 months (around 06/28/2021) for HTN management.     Leilani Merl, FNP, have reviewed all documentation for this visit. The documentation on 03/28/21 for the exam, diagnosis, procedures, and orders are all accurate and complete.    Jacky Kindle, FNP  [facility] [phone] (phone) [phone] (fax)  [facility]

## 2021-03-28 NOTE — Assessment & Plan Note (Signed)
Previously hypokalemic On PO supplement Eating potassium rich diet per pt report Repeat chemistry

## 2021-03-29 ENCOUNTER — Ambulatory Visit (INDEPENDENT_AMBULATORY_CARE_PROVIDER_SITE_OTHER): Payer: Medicare Other | Admitting: Urology

## 2021-03-29 ENCOUNTER — Telehealth: Payer: Self-pay

## 2021-03-29 ENCOUNTER — Other Ambulatory Visit: Payer: Self-pay

## 2021-03-29 ENCOUNTER — Encounter: Payer: Self-pay | Admitting: Urology

## 2021-03-29 VITALS — BP 123/84 | HR 93 | Ht 66.0 in | Wt 167.0 lb

## 2021-03-29 DIAGNOSIS — R3129 Other microscopic hematuria: Secondary | ICD-10-CM

## 2021-03-29 LAB — COMPREHENSIVE METABOLIC PANEL
ALT: 14 IU/L (ref 0–32)
AST: 19 IU/L (ref 0–40)
Albumin/Globulin Ratio: 2 (ref 1.2–2.2)
Albumin: 4.7 g/dL (ref 3.7–4.7)
Alkaline Phosphatase: 82 IU/L (ref 44–121)
BUN/Creatinine Ratio: 18 (ref 12–28)
BUN: 23 mg/dL (ref 8–27)
Bilirubin Total: 0.4 mg/dL (ref 0.0–1.2)
CO2: 27 mmol/L (ref 20–29)
Calcium: 9.6 mg/dL (ref 8.7–10.3)
Chloride: 102 mmol/L (ref 96–106)
Creatinine, Ser: 1.27 mg/dL — ABNORMAL HIGH (ref 0.57–1.00)
Globulin, Total: 2.3 g/dL (ref 1.5–4.5)
Glucose: 92 mg/dL (ref 70–99)
Potassium: 4.6 mmol/L (ref 3.5–5.2)
Sodium: 142 mmol/L (ref 134–144)
Total Protein: 7 g/dL (ref 6.0–8.5)
eGFR: 45 mL/min/{1.73_m2} — ABNORMAL LOW (ref >59)

## 2021-03-29 NOTE — Telephone Encounter (Signed)
Copied from CRM [id number]. Topic: General - Other >> Mar 29, 2021  1:12 PM Jaquita Rector A wrote: Reason for CRM: Patient called in to inquire if Jessica Little about getting a handicapped plackard and also is interested in getting a life line button that she wear to aide her in getting help in an emergency situations. Any questions or concerns please call Ph#  [phone]

## 2021-03-29 NOTE — Progress Notes (Signed)
Chemistry panel is back.  Continues to show some kidney damage.  Ensure proper hydration.  Ideally recommend 64 oz minimum of water per day.  Would hold your potassium supplement for 3 days and then resume.  You are on the high end of normal at this time.  You clearly have done a good job of eating more potassium!

## 2021-03-29 NOTE — Progress Notes (Signed)
03/29/2021 3:02 PM   Jessica Little [birth year] [id number]  Referring provider: Jacky Kindle, FNP [address] [place],  [place] [address]  Chief Complaint  Patient presents with   Hematuria    HPI: Jessica Little is a 75 y.o. female who presents for evaluation of hematuria.  ED visit 03/19/2021 after a syncopal episode Incidentally noted to have >50 RBCs and 11-20 on microscopy No bothersome LUTS Urine culture ordered which showed multiple species No significant epis on urinalysis Denies gross hematuria No anticoagulant//blood thinners Denies flank, abdominal or pelvic pain Denies prior history of urologic problems No previous tobacco history   PMH: Past Medical History:  Diagnosis Date   Anxiety    GERD (gastroesophageal reflux disease)    Hyperlipidemia    Hypertension     Surgical History: Past Surgical History:  Procedure Laterality Date   CATARACT EXTRACTION W/PHACO Left 01/18/2021   Procedure: CATARACT EXTRACTION PHACO AND INTRAOCULAR LENS PLACEMENT (IOC) LEFT 7.40 01:11.7;  Surgeon: Lockie Mola, MD;  Location: [facility];  Service: Ophthalmology;  Laterality: Left;   CATARACT EXTRACTION W/PHACO Right 02/01/2021   Procedure: CATARACT EXTRACTION PHACO AND INTRAOCULAR LENS PLACEMENT (IOC) RIGHT EYHANCE TORIC 8.60 01:09.9 ;  Surgeon: Lockie Mola, MD;  Location: [facility];  Service: Ophthalmology;  Laterality: Right;   CHOLECYSTECTOMY     COLONOSCOPY  01/01/2006   COLONOSCOPY WITH PROPOFOL N/A 08/23/2015   Procedure: COLONOSCOPY WITH PROPOFOL;  Surgeon: Earline Mayotte, MD;  Location: [facility] END[facility];  Service: Endoscopy;  Laterality: N/A;   HIATAL HERNIA REPAIR  2019    Home Medications:  Allergies as of 03/29/2021   No Known Allergies      Medication List        Accurate as of March 29, 2021  3:02 PM. If you have any questions, ask your nurse or doctor.          atenolol 50 MG tablet Commonly known as:  TENORMIN Take 1 tablet by mouth once daily   Biotin 81448 MCG Tabs Take by mouth.   calcium carbonate 600 MG Tabs tablet Commonly known as: OS-CAL Take 600 mg by mouth daily with breakfast.   Flaxseed Oil 1000 MG Caps Take by mouth.   lisinopril 10 MG tablet Commonly known as: ZESTRIL Take 1 tablet (10 mg total) by mouth daily.   Magnesium 400 MG Caps Take by mouth.   NON FORMULARY Probiotic daily.   nortriptyline 50 MG capsule Commonly known as: PAMELOR Take 1 capsule (50 mg total) by mouth at bedtime.   omeprazole 40 MG capsule Commonly known as: PRILOSEC Take 1 capsule (40 mg total) by mouth 2 (two) times daily.   perphenazine 4 MG tablet Commonly known as: TRILAFON TAKE 1 TABLET BY MOUTH AT BEDTIME   potassium chloride 10 MEQ tablet Commonly known as: KLOR-CON Take 1 tablet by mouth once daily   simvastatin 40 MG tablet Commonly known as: ZOCOR Take 1 tablet (40 mg total) by mouth at bedtime.   VITAMIN D3 PO Take by mouth daily.   vitamin E 1000 UNIT capsule Take 1,000 Units by mouth daily.   ZINC 15 PO Take by mouth.        Allergies: No Known Allergies  Family History: Family History  Problem Relation Age of Onset   Arrhythmia Mother    Heart attack Father    Stroke Father    Healthy Sister    Healthy Brother    Healthy Sister    Breast cancer  Neg Hx     Social History:  reports that she has never smoked. She has never used smokeless tobacco. She reports that she does not drink alcohol and does not use drugs.   Physical Exam: BP 123/84   Pulse 93   Ht 5\' 6"  (1.676 m)   Wt 167 lb (75.8 kg)   BMI 26.95 kg/m   Constitutional:  Alert and oriented, No acute distress. HEENT: [place] AT, moist mucus membranes.  Trachea midline, no masses. Cardiovascular: No clubbing, cyanosis, or edema. Respiratory: Normal respiratory effort, no increased work of breathing. GI: Abdomen is soft, nontender, nondistended, no abdominal masses GU: No CVA  tenderness Psychiatric: Normal mood and affect.   Assessment & Plan:    1.  Asymptomatic microhematuria AUA hematuria risk stratification: High Potential causes of hematuria were discussed including both benign and malignant etiologies We discussed the recommend evaluation for high risk hematuria to include CT urogram and cystoscopy The procedures were discussed in detail and she desires to proceed   Abbie Sons, MD  [facility] [address] [place],  [id number] [phone]

## 2021-03-31 NOTE — Telephone Encounter (Signed)
Hi Alex, If patient wanted to get a life alert button would that be something [facility] could help with? KW

## 2021-04-03 NOTE — Telephone Encounter (Signed)
Jessica Little,   Are you familiar with what we can do to help get the patient set up with life alert or other product?

## 2021-04-04 ENCOUNTER — Telehealth: Payer: Self-pay | Admitting: *Deleted

## 2021-04-04 NOTE — Telephone Encounter (Signed)
   04/04/2021  Jessica Little 24-Dec-1945 [id number]   Phone call to patient to discuss options for MedAlert programs. Patient confirmed that she is now in process of working to obtain a MedAlert system through her insurance company at this time. Patient encouraged to contact this social worker with any additional community resource needs.    Verna Czech, LCSW Clinical Social Worker  [facility] [phone]

## 2021-04-14 DIAGNOSIS — R55 Syncope and collapse: Secondary | ICD-10-CM | POA: Diagnosis not present

## 2021-04-28 ENCOUNTER — Ambulatory Visit: Payer: Medicare Other

## 2021-05-02 ENCOUNTER — Ambulatory Visit (INDEPENDENT_AMBULATORY_CARE_PROVIDER_SITE_OTHER): Payer: Medicare Other

## 2021-05-02 ENCOUNTER — Telehealth: Payer: Self-pay

## 2021-05-02 ENCOUNTER — Other Ambulatory Visit: Payer: Self-pay

## 2021-05-02 DIAGNOSIS — R55 Syncope and collapse: Secondary | ICD-10-CM

## 2021-05-02 LAB — ECHOCARDIOGRAM COMPLETE
AR max vel: 2.71 cm2
AV Area VTI: 2.7 cm2
AV Area mean vel: 2.81 cm2
AV Mean grad: 6 mmHg
AV Peak grad: 9.5 mmHg
Ao pk vel: 1.54 m/s
Area-P 1/2: 2.17 cm2
Calc EF: 75.9 %
S' Lateral: 2.1 cm
Single Plane A2C EF: 73.8 %
Single Plane A4C EF: 76.9 %

## 2021-05-02 NOTE — Telephone Encounter (Signed)
Called patient to give result note as documented below. Was unable to leave a VM, received a message stating to, "enter an access code". Will try again tomorrow.

## 2021-05-02 NOTE — Telephone Encounter (Signed)
-----   Message from Debbe Odea, MD sent at 04/26/2021  7:10 PM EST ----- Paroxysmal SVT and nonsustained VT noted.  No arrhythmias to suggest etiology of syncope noted.  Patient triggered events were associated with sinus rhythm.

## 2021-05-03 ENCOUNTER — Ambulatory Visit
Admission: RE | Admit: 2021-05-03 | Discharge: 2021-05-03 | Disposition: A | Discharge status: Home / Self Care | Payer: Medicare Other | Source: Ambulatory Visit | Attending: Urology | Admitting: Urology

## 2021-05-03 DIAGNOSIS — D259 Leiomyoma of uterus, unspecified: Secondary | ICD-10-CM | POA: Diagnosis not present

## 2021-05-03 DIAGNOSIS — R3129 Other microscopic hematuria: Secondary | ICD-10-CM | POA: Insufficient documentation

## 2021-05-03 DIAGNOSIS — K7689 Other specified diseases of liver: Secondary | ICD-10-CM | POA: Diagnosis not present

## 2021-05-03 DIAGNOSIS — R55 Syncope and collapse: Secondary | ICD-10-CM | POA: Diagnosis not present

## 2021-05-03 LAB — POCT I-STAT CREATININE: Creatinine, Ser: 1 mg/dL (ref 0.44–1.00)

## 2021-05-03 IMAGING — CT CT ABD-PEL WO/W CM
3 of 12 series · 11 of 46 positions shown, 17 images · IV contrast (omnipaque)
Comparison: None.

CLINICAL DATA: Pt states she was brought to the ER after being at
church and having a syncopal episode with LOC. Pt states her urine
was microscopic hematuria.

EXAM:
CT ABDOMEN AND PELVIS WITHOUT AND WITH CONTRAST
TECHNIQUE: Multidetector CT imaging of the abdomen and pelvis was performed
following the standard protocol before and following the bolus
administration of intravenous contrast.
CONTRAST:  100mL OMNIPAQUE IOHEXOL 350 MG/ML SOLN

[Series 3: abd without pre 5.00 · axial · non-contrast · 0.68mm/px · z∈[-1530,-1465]mm · 2 of 95 slices shown]
[im 14/95  soft-tissue]
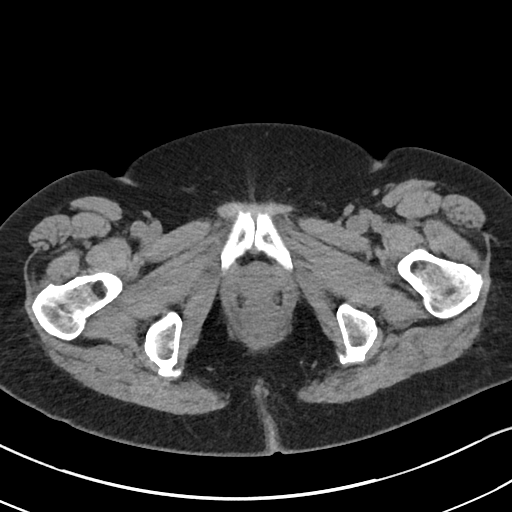
[im 27/95  soft-tissue]
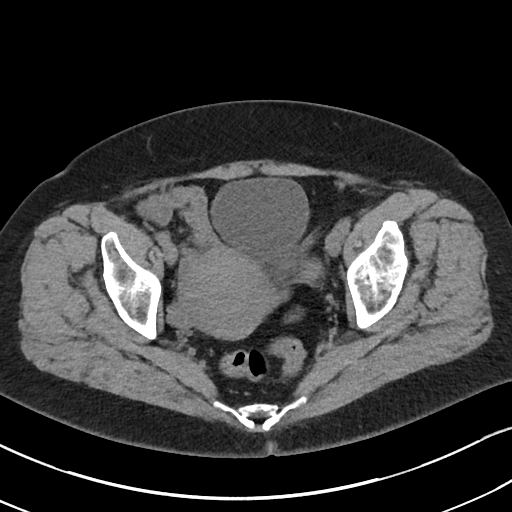

[Series 17: axial delay delay prone 5.00 · axial · delayed · 0.68mm/px · z∈[-1496,-1126]mm · 7 of 100 slices shown, 12 images]
[im 13/100  soft-tissue]
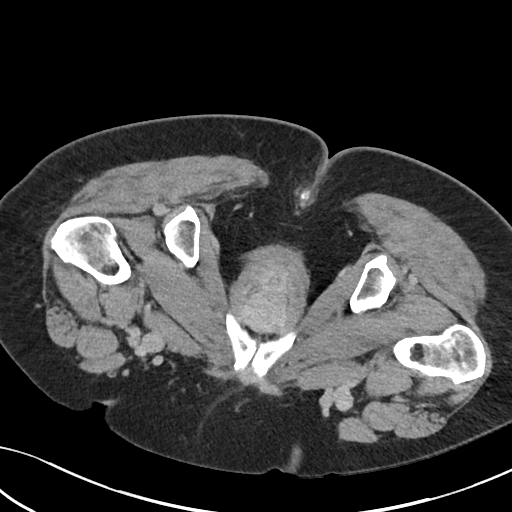
[im 13/100  bone]
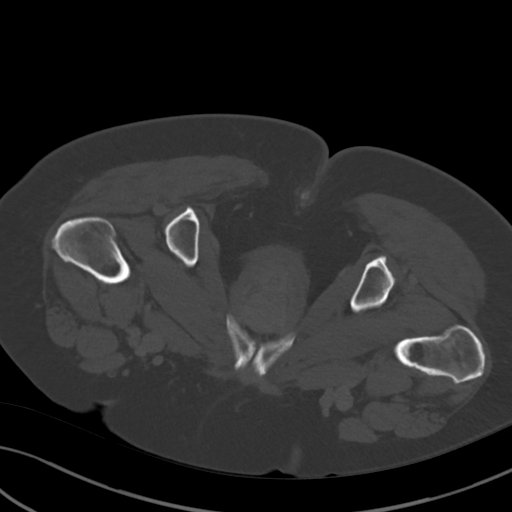
[im 25/100  soft-tissue]
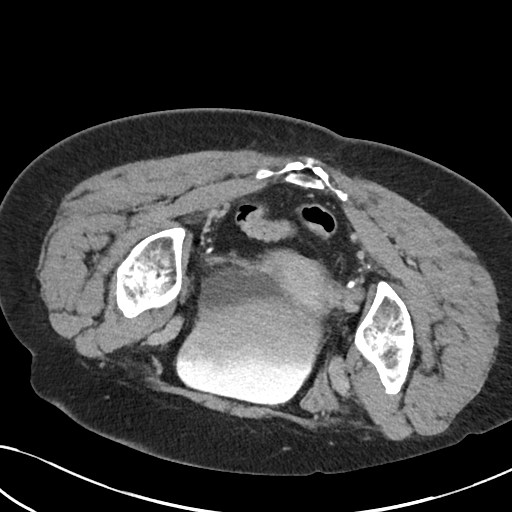
[im 38/100  soft-tissue]
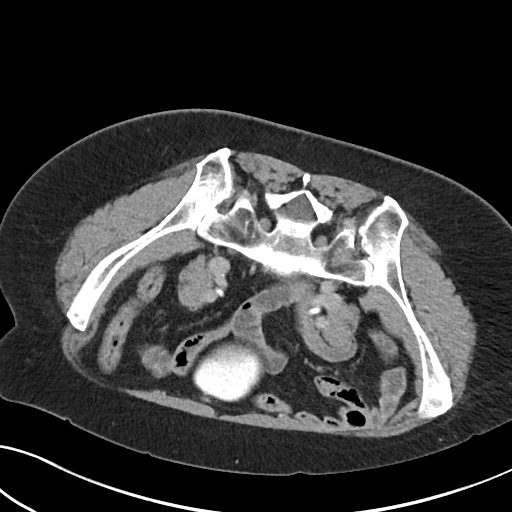
[im 50/100  soft-tissue]
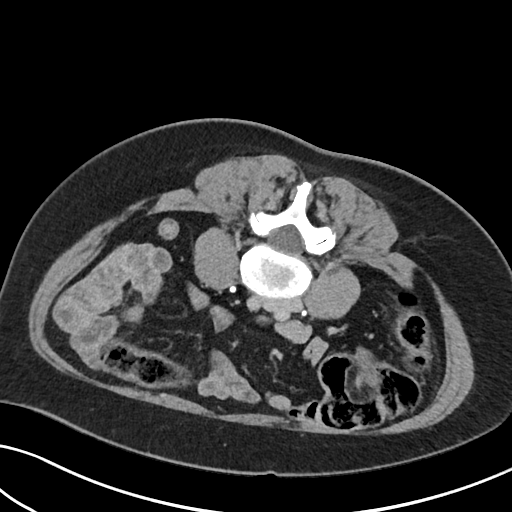
[im 50/100  lung]
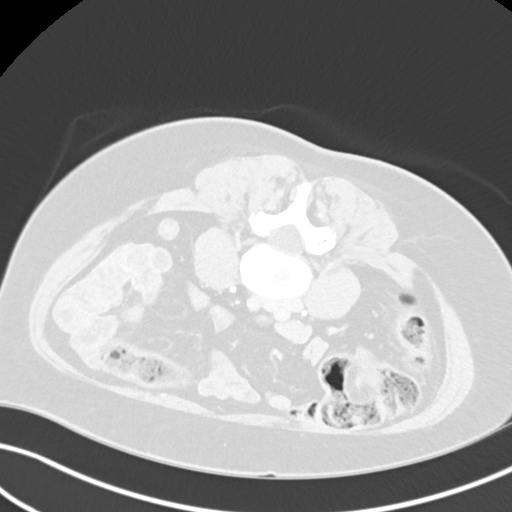
[im 62/100  soft-tissue]
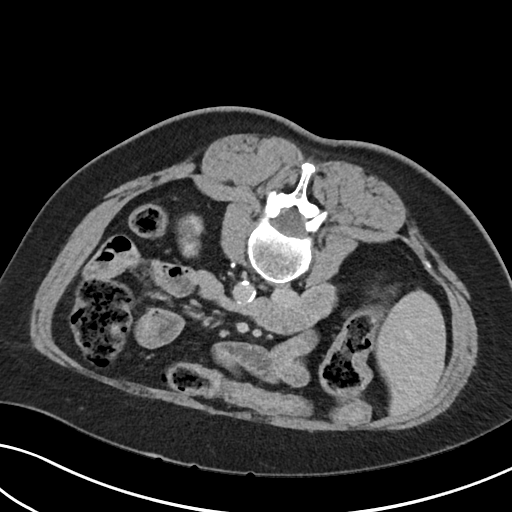
[im 62/100  lung]
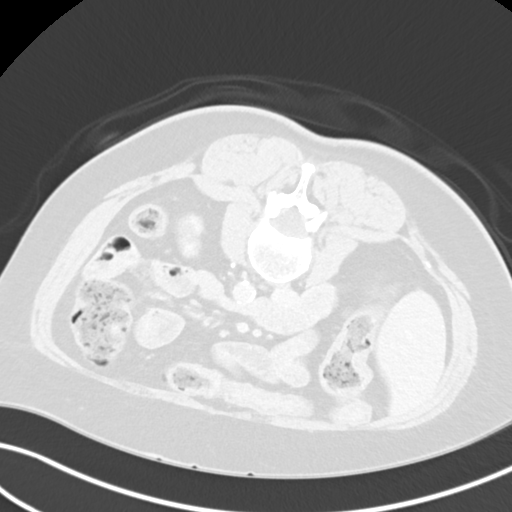
[im 75/100  soft-tissue]
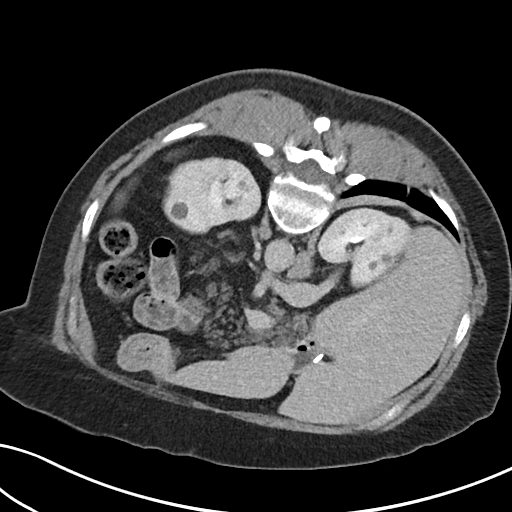
[im 75/100  lung]
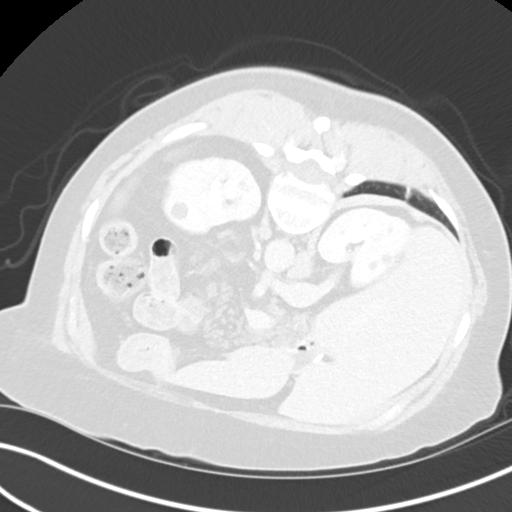
[im 87/100  soft-tissue]
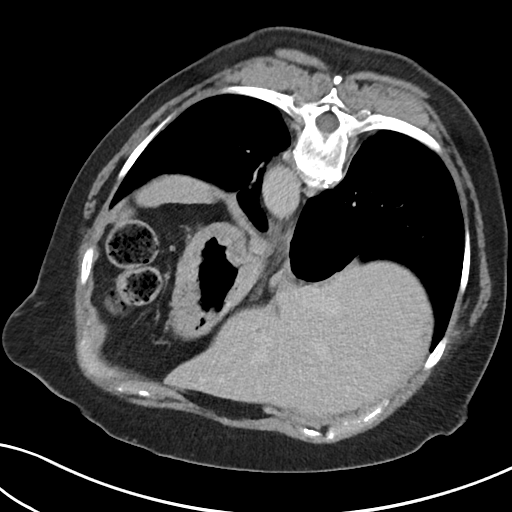
[im 87/100  lung]
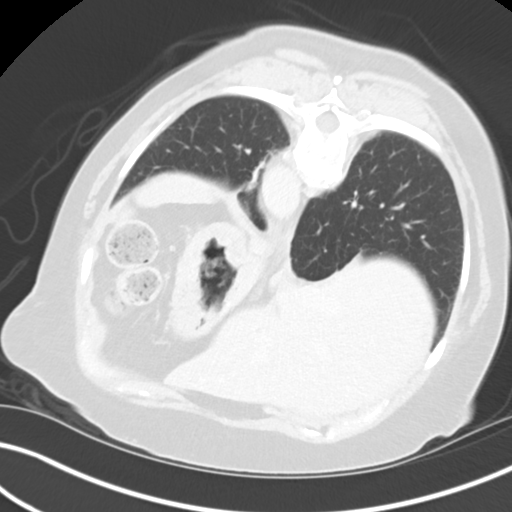

[Series 20: cor delay delay prone 2.00 cor · coronal · delayed · 0.68mm/px · 2 of 162 slices shown, 3 images]
[im 54/162  soft-tissue]
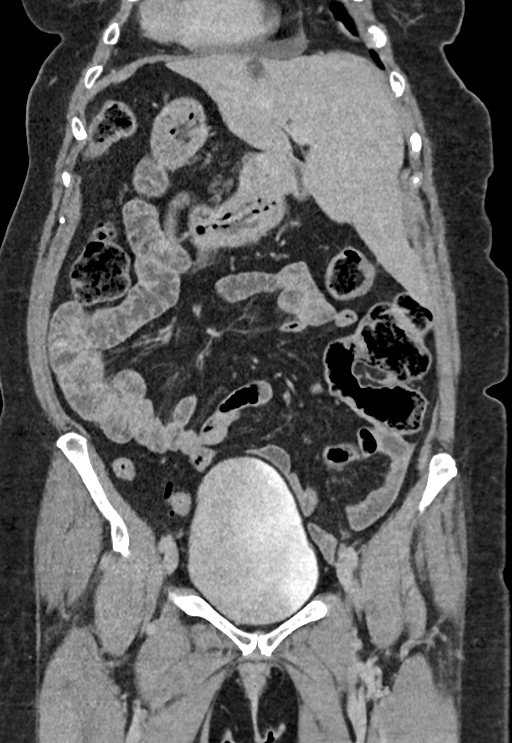
[im 54/162  bone]
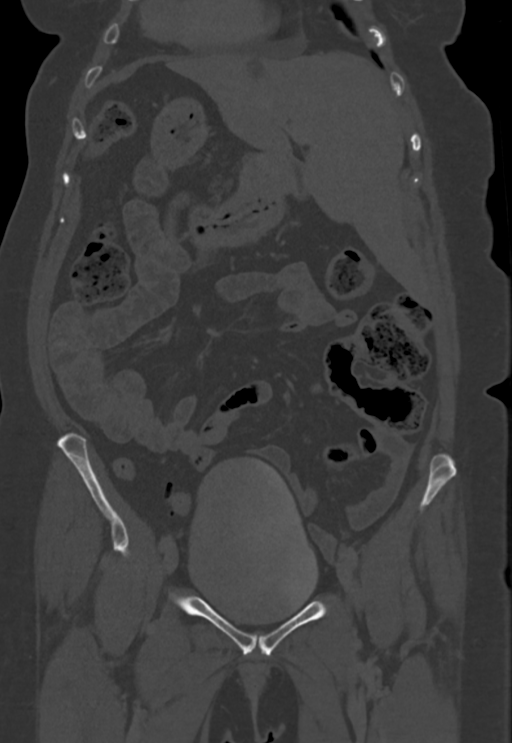
[im 108/162  soft-tissue]
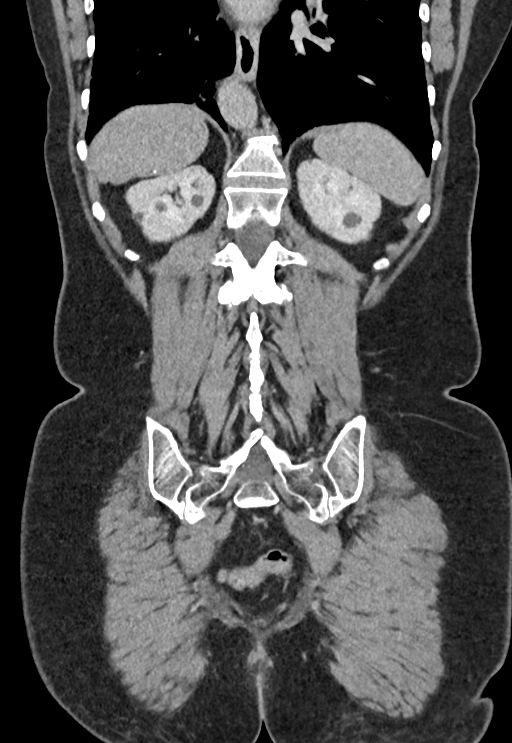

[11 of 46 positions shown; findings below may reference images not displayed]

FINDINGS: Lower chest: Lung bases are clear.

Hepatobiliary: Large cyst in the RIGHT hepatic. Postcholecystectomy.

Pancreas: Fatty replacement pancreatic.  No duct dilatation

Spleen: Normal spleen

Adrenals/urinary tract: Adrenal glands normal.

Noncontrast imaging demonstrates no nephrolithiasis or
ureterolithiasis.

Cortical phase imaging demonstrate multiple round hypodense lesions
within the LEFT and right renal cortex. These lesions are
homogeneous and immediate density (between 20 and 30 Hounsfield
units). Difficult to fully characterize due to small size. No clear
enhancing lesions are present but again difficult to characterize.

Delayed imaging demonstrates no filling defects within collecting
systems ureters.

No bladder calculi, enhancing bladder lesions, or filling defect
within the bladder.

Stomach/Bowel: Stomach, small bowel, appendix, and cecum are normal.
The colon and rectosigmoid colon are normal.

Vascular/Lymphatic: Abdominal aorta is normal caliber. No periportal
or retroperitoneal adenopathy. No pelvic adenopathy.

Reproductive: Large round enhancing mass in the uterine fundus most
consistent with a benign leiomyoma measuring 5.4 cm.

Other: No free fluid.

Musculoskeletal: No aggressive osseous lesion.
IMPRESSION: 1. No explanation for hematuria.
2. Multiple bilateral round intermediate density renal lesions
cannot be fully characterized. Favor small benign cysts; however,
with hematuria, recommend MRI renal protocol with contrast for
further characterization.
3. No bladder stones or filling defects in the bladder which does
not excluded a bladder lesion.
4. No acute findings abdomen pelvis.
5. Large enhancing leiomyoma of the uterine fundus.

## 2021-05-03 MED ORDER — IOHEXOL 350 MG/ML SOLN
100.0000 mL | Freq: Once | INTRAVENOUS | Status: AC | PRN
  Administered 2021-05-03: 100 mL via INTRAVENOUS

## 2021-05-04 ENCOUNTER — Other Ambulatory Visit: Payer: Self-pay

## 2021-05-04 ENCOUNTER — Ambulatory Visit (INDEPENDENT_AMBULATORY_CARE_PROVIDER_SITE_OTHER): Payer: Medicare Other | Admitting: Urology

## 2021-05-04 ENCOUNTER — Encounter: Payer: Self-pay | Admitting: Urology

## 2021-05-04 VITALS — BP 148/83 | HR 74 | Ht 66.0 in | Wt 170.0 lb

## 2021-05-04 DIAGNOSIS — R3129 Other microscopic hematuria: Secondary | ICD-10-CM

## 2021-05-04 LAB — URINALYSIS, COMPLETE
Bilirubin, UA: NEGATIVE
Glucose, UA: NEGATIVE
Ketones, UA: NEGATIVE
Leukocytes,UA: NEGATIVE
Nitrite, UA: NEGATIVE
Protein,UA: NEGATIVE
RBC, UA: NEGATIVE
Specific Gravity, UA: 1.015 (ref 1.005–1.030)
Urobilinogen, Ur: 0.2 mg/dL (ref 0.2–1.0)
pH, UA: 7 (ref 5.0–7.5)

## 2021-05-04 LAB — MICROSCOPIC EXAMINATION
Bacteria, UA: NONE SEEN
RBC, Urine: NONE SEEN /hpf (ref 0–2)

## 2021-05-04 NOTE — Progress Notes (Signed)
   05/04/21  CC:  Chief Complaint  Patient presents with   Cysto   Indications: Microhematuria-high risk  HPI: Ms. Lanphier has no complaints today.  Denies gross hematuria.  CT urogram performed yesterday was reviewed and there are bilateral small, renal cysts.  No upper tract abnormalities identified.  There is a stable uterine fibroid compressing the bladder.  Radiology report is pending.  UA today dipstick/microscopy negative  Blood pressure (!) 148/83, pulse 74, height 5\' 6"  (1.676 m), weight 170 lb (77.1 kg). NED. A&Ox3.   No respiratory distress   Abd soft, NT, ND Atrophic external genitalia with patent urethral meatus  Cystoscopy Procedure Note  Patient identification was confirmed, informed consent was obtained, and patient was prepped using Betadine solution.  Lidocaine jelly was administered per urethral meatus.    Procedure: - Flexible cystoscope introduced, without any difficulty.   - Thorough search of the bladder revealed:    normal urethral meatus    normal urothelium    no stones    no ulcers     no tumors    no urethral polyps    no trabeculation  - Ureteral orifices were normal in position and appearance. - Compression of superior aspect of the bladder secondary to fibroid noted  Post-Procedure: - Patient tolerated the procedure well  Assessment/ Plan: No mucosal abnormalities identified on cystoscopy No upper tract abnormalities on CTU Follow-up 6 months with repeat UA   , MD

## 2021-05-05 ENCOUNTER — Encounter: Payer: Self-pay | Admitting: Cardiology

## 2021-05-05 ENCOUNTER — Ambulatory Visit (INDEPENDENT_AMBULATORY_CARE_PROVIDER_SITE_OTHER): Payer: Medicare Other | Admitting: Cardiology

## 2021-05-05 VITALS — BP 144/86 | HR 66 | Ht 66.0 in | Wt 170.4 lb

## 2021-05-05 DIAGNOSIS — I10 Essential (primary) hypertension: Secondary | ICD-10-CM

## 2021-05-05 DIAGNOSIS — R55 Syncope and collapse: Secondary | ICD-10-CM

## 2021-05-05 NOTE — Patient Instructions (Signed)

## 2021-05-05 NOTE — Progress Notes (Signed)
Cardiology Office Note:    Date:  05/05/2021   ID:  Jessica Little, DOB [birth year], MRN [id number]  PCP:  Jacky Kindle, FNP   [facility] Providers Cardiologist:  None     Referring MD: Jacky Kindle, FNP   Chief Complaint  Patient presents with   Follow-up    From ECHO and a Heart Monitor     History of Present Illness:    Jessica Little is a 75 y.o. female with a hx of hypertension, hyperlipidemia, who presents for follow-up.  She was previously seen due to syncope.  She was at church and felt flushed, nauseous prior to passing out.  Denies any prior symptoms, has not had any episodes since.  Cardiac monitor and echocardiogram were ordered to evaluate any cardiac etiology for syncope.  Patient's symptoms seem vasovagal.  She also had some shaking when she fell.  There is?  Seizure disorder, plans to follow-up with neurology regarding this.  She has tried to stay hydrated since that incident, feels well otherwise.   Past Medical History:  Diagnosis Date   Anxiety    GERD (gastroesophageal reflux disease)    Hyperlipidemia    Hypertension     Past Surgical History:  Procedure Laterality Date   CATARACT EXTRACTION W/PHACO Left 01/18/2021   Procedure: CATARACT EXTRACTION PHACO AND INTRAOCULAR LENS PLACEMENT (IOC) LEFT 7.40 01:11.7;  Surgeon: Lockie Mola, MD;  Location: [facility];  Service: Ophthalmology;  Laterality: Left;   CATARACT EXTRACTION W/PHACO Right 02/01/2021   Procedure: CATARACT EXTRACTION PHACO AND INTRAOCULAR LENS PLACEMENT (IOC) RIGHT EYHANCE TORIC 8.60 01:09.9 ;  Surgeon: Lockie Mola, MD;  Location: [facility];  Service: Ophthalmology;  Laterality: Right;   CHOLECYSTECTOMY     COLONOSCOPY  01/01/2006   COLONOSCOPY WITH PROPOFOL N/A 08/23/2015   Procedure: COLONOSCOPY WITH PROPOFOL;  Surgeon: Earline Mayotte, MD;  Location: [facility] ENDOSCOPY;  Service: Endoscopy;  Laterality: N/A;   HIATAL HERNIA REPAIR  2019     Current Medications: Current Meds  Medication Sig   atenolol (TENORMIN) 50 MG tablet Take 1 tablet by mouth once daily   Biotin 00712 MCG TABS Take by mouth.   calcium carbonate (OS-CAL) 600 MG TABS tablet Take 600 mg by mouth daily with breakfast.   Cholecalciferol (VITAMIN D3 PO) Take by mouth daily.   Flaxseed, Linseed, (FLAXSEED OIL) 1000 MG CAPS Take by mouth.   lisinopril (ZESTRIL) 10 MG tablet Take 1 tablet (10 mg total) by mouth daily.   Magnesium 400 MG CAPS Take by mouth.   NON FORMULARY Probiotic daily.   nortriptyline (PAMELOR) 50 MG capsule Take 1 capsule (50 mg total) by mouth at bedtime.   omeprazole (PRILOSEC) 40 MG capsule Take 1 capsule (40 mg total) by mouth 2 (two) times daily.   perphenazine (TRILAFON) 4 MG tablet TAKE 1 TABLET BY MOUTH AT BEDTIME   potassium chloride (KLOR-CON) 10 MEQ tablet Take 1 tablet by mouth once daily   simvastatin (ZOCOR) 40 MG tablet Take 1 tablet (40 mg total) by mouth at bedtime.   vitamin E 1000 UNIT capsule Take 1,000 Units by mouth daily.   Zinc Sulfate (ZINC 15 PO) Take by mouth.     Allergies:   Patient has no known allergies.   Social History   Socioeconomic History   Marital status: Widowed    Spouse name: Not on file   Number of children: 0   Years of education: College   Highest education level: Bachelor's  degree (e.g., BA, AB, BS)  Occupational History   Occupation: Retired  Tobacco Use   Smoking status: Never   Smokeless tobacco: Never  Vaping Use   Vaping Use: Never used  Substance and Sexual Activity   Alcohol use: No   Drug use: No   Sexual activity: Not on file  Other Topics Concern   Not on file  Social History Narrative   Not on file   Social Determinants of Health   Financial Resource Strain: Low Risk    Difficulty of Paying Living Expenses: Not hard at all  Food Insecurity: No Food Insecurity   Worried About Programme researcher, broadcasting/film/video in the Last Year: Never true   Ran Out of Food in the Last Year:  Never true  Transportation Needs: No Transportation Needs   Lack of Transportation (Medical): No   Lack of Transportation (Non-Medical): No  Physical Activity: Insufficiently Active   Days of Exercise per Week: 2 days   Minutes of Exercise per Session: 40 min  Stress: No Stress Concern Present   Feeling of Stress : Not at all  Social Connections: Moderately Isolated   Frequency of Communication with Friends and Family: More than three times a week   Frequency of Social Gatherings with Friends and Family: More than three times a week   Attends Religious Services: More than 4 times per year   Active Member of [employer] or Organizations: No   Attends Banker Meetings: Never   Marital Status: Widowed     Family History: The patient's family history includes Arrhythmia in her mother; Healthy in her brother, sister, and sister; Heart attack in her father; Stroke in her father. There is no history of Breast cancer.  ROS:   Please see the history of present illness.     All other systems reviewed and are negative.  EKGs/Labs/Other Studies Reviewed:    The following studies were reviewed today:   EKG:  EKG is  ordered today.    The ekg ordered 03/19/2021 demonstrates normal sinus rhythm.  Recent Labs: 05/23/2020: TSH 2.300 03/19/2021: Hemoglobin 13.1; Platelets 202 03/28/2021: ALT 14; BUN 23; Potassium 4.6; Sodium 142 05/03/2021: Creatinine, Ser 1.00  Recent Lipid Panel    Component Value Date/Time   CHOL 192 05/23/2020 1434   TRIG 160 (H) 05/23/2020 1434   HDL 57 05/23/2020 1434   CHOLHDL 3.7 02/25/2019 1037   LDLCALC 107 (H) 05/23/2020 1434     Risk Assessment/Calculations:          Physical Exam:    VS:  BP (!) 144/86   Pulse 66   Ht 5\' 6"  (1.676 m)   Wt 170 lb 6.4 oz (77.3 kg)   SpO2 96%   BMI 27.50 kg/m     Wt Readings from Last 3 Encounters:  05/05/21 170 lb 6.4 oz (77.3 kg)  05/04/21 170 lb (77.1 kg)  03/29/21 167 lb (75.8 kg)     GEN:  Well  nourished, well developed in no acute distress HEENT: Normal NECK: No JVD; No carotid bruits LYMPHATICS: No lymphadenopathy CARDIAC: RRR, no murmurs, rubs, gallops RESPIRATORY:  Clear to auscultation without rales, wheezing or rhonchi  ABDOMEN: Soft, non-tender, non-distended MUSCULOSKELETAL:  No edema; No deformity  SKIN: Warm and dry NEUROLOGIC:  Alert and oriented x 3 PSYCHIATRIC:  Normal affect   ASSESSMENT:    1. Syncope and collapse   2. Primary hypertension    PLAN:    In order of problems listed above:  Syncope, appears vasovagal with prodrome of nausea, flushing.  Prior orthostatic vitals with no evidence for orthostasis.  Cardiac monitor with occasional paroxysmal SVT, no significant findings to suggest etiology of syncope.  Echo with EF 60 to 65%, no gross structural abnormalities.  Hypertension, BP slightly elevated, usually controlled.  Continue lisinopril 10 mg daily.  Follow-up as needed    Medication Adjustments/Labs and Tests Ordered: Current medicines are reviewed at length with the patient today.  Concerns regarding medicines are outlined above.  No orders of the defined types were placed in this encounter.  No orders of the defined types were placed in this encounter.   Patient Instructions  Medication Instructions:  Your physician recommends that you continue on your current medications as directed. Please refer to the Current Medication list given to you today.  *If you need a refill on your cardiac medications before your next appointment, please call your pharmacy*   Lab Work: None ordered If you have labs (blood work) drawn today and your tests are completely normal, you will receive your results only by: MyChart Message (if you have MyChart) OR A paper copy in the mail If you have any lab test that is abnormal or we need to change your treatment, we will call you to review the results.   Testing/Procedures: None ordered   Follow-Up: At  [facility], you and your health needs are our priority.  As part of our continuing mission to provide you with exceptional heart care, we have created designated Provider Care Teams.  These Care Teams include your primary Cardiologist (physician) and Advanced Practice Providers (APPs -  Physician Assistants and Nurse Practitioners) who all work together to provide you with the care you need, when you need it.  We recommend signing up for the patient portal called "MyChart".  Sign up information is provided on this After Visit Summary.  MyChart is used to connect with patients for Virtual Visits (Telemedicine).  Patients are able to view lab/test results, encounter notes, upcoming appointments, etc.  Non-urgent messages can be sent to your provider as well.   To learn more about what you can do with MyChart, go to [email or web address].    Your next appointment:   Follow up as needed   The format for your next appointment:   In Person  Provider:   You may see Debbe Odea, MD  or one of the following Advanced Practice Providers on your designated Care Team:   Nicolasa Ducking, NP Eula Listen, PA-C Cadence Fransico Michael, [place]   Other Instructions    Signed, Debbe Odea, MD  05/05/2021 3:47 PM    [facility]

## 2021-05-11 NOTE — Telephone Encounter (Signed)
Patient seen in clinic on 05/05/21, results reviewed with Dr. Azucena Cecil.

## 2021-06-21 ENCOUNTER — Telehealth: Payer: Self-pay | Admitting: Family Medicine

## 2021-06-21 NOTE — Telephone Encounter (Signed)
Copied from CRM [id number]. Topic: Medicare AWV >> Jun 21, 2021  3:04 PM Claudette Laws R wrote: Reason for CRM:  No answer unable to leave a message for patient to call back and schedule Medicare Annual Wellness Visit (AWV) in office.   If not able to come in office, please offer to do virtually or by telephone.   Last AWV: 06/21/2020  Please schedule at anytime with [facility] Health Advisor.  If any questions, please contact me at [phone]

## 2021-07-05 ENCOUNTER — Ambulatory Visit (INDEPENDENT_AMBULATORY_CARE_PROVIDER_SITE_OTHER): Payer: Medicare Other | Admitting: Neurology

## 2021-07-05 ENCOUNTER — Other Ambulatory Visit: Payer: Self-pay

## 2021-07-05 ENCOUNTER — Encounter: Payer: Self-pay | Admitting: Neurology

## 2021-07-05 VITALS — BP 187/125 | HR 74 | Resp 20 | Ht 66.0 in | Wt 173.0 lb

## 2021-07-05 DIAGNOSIS — R55 Syncope and collapse: Secondary | ICD-10-CM | POA: Diagnosis not present

## 2021-07-05 NOTE — Patient Instructions (Signed)
Schedule MRI brain without contrast, MRA head without contrast  2. Schedule 1-hour EEG, if normal we will do a 24-hour EEG  3. Please let your cardiologist know about this recent episode of loss of consciousness  4. It is prudent to recommend that all persons should be free of syncopal episodes for at least six months to be granted the driving privilege." (THE [employer] PHYSICIAN'S GUIDE TO DRIVER MEDICAL EVALUATION, Second Edition, Medical Review Branch, Associate Professor, Division of [employer] of Transportation, July 2004)   5. Follow-up after tests, call for any changes

## 2021-07-05 NOTE — Progress Notes (Signed)
NEUROLOGY CONSULTATION NOTE  Jessica Little MRN: [id number] DOB: [birth year]  Referring provider: Merita Norton, FNP Primary care provider: Merita Norton, FNP  Reason for consult:  seizures, syncope  Thank you for your kind referral of Jessica Little for consultation of the above symptoms. Although her history is well known to you, please allow me to reiterate it for the purpose of our medical record. She is alone in the office today. Records and images were personally reviewed where available.   HISTORY OF PRESENT ILLNESS: This is a 76 year old right-handed woman with a history of hypertension, hyperlipidemia, depression, presenting for evaluation of loss of consciousness with shaking. The first episode occurred in 02/2021 while she was sitting at church. She felt fine that morning, then while at church, she suddenly felt nauseated and flushed. She had a slight headache, felt hot and was fanning herself, then woke up on the ground. She does not remember much but was told she was shaking/having jerking motions on the ground and hit her head. She was evaluated by Cardiology and had a normal echocardiogram. She had a holter monitor which showed paroxymal SVT and nonsustained SVT, no arrhythmias. Patient triggered events showed sinus rhythm. She had another syncopal episode on 06/17/2021 again while sitting at church. She had a sick stomach, felt nauseated, then woke up on the floor. No tongue bite or incontinence. She denied feeling dizzy/lightheaded. She had been sitting most of the morning. She had eaten breakfast that day. No alcohol or sleep deprivation. Since the first episode, there are times where her head hurts in the back "like a heaviness" that she can't hold her head up lasting 15 minutes but states there is no pain. She has had a similar "sick stomach" feeling in 2019 and was diagnosed with a hiatal hernia and symptoms improved after surgery. She denies any staring/unresponsive episodes,  olfactory/gustatory hallucinations, deja vu, rising epigastric sensation, focal numbness/tingling/weakness, myoclonic jerks. She denies any other gaps in time but "falls asleep when tired."Sometimes while driving she notices a little vision problem where she has to concentrate because her vision bothers her. No diplopia, dysarthria/dysphagia. She pulled her right shoulder muscle this morning. She has occasional diarrhea. She lives alone. She gets 5-6 hours of sleep. Memory is good. She is on nortriptyline and perphenazine for depression, no recent medication changes. She recalls her sister fainted before due to low BP and potassium. She had a normal birth and early development.  There is no history of febrile convulsions, CNS infections such as meningitis/encephalitis, significant traumatic brain injury, neurosurgical procedures, or family history of seizures.   PAST MEDICAL HISTORY: Past Medical History:  Diagnosis Date   Anxiety    GERD (gastroesophageal reflux disease)    Hyperlipidemia    Hypertension     PAST SURGICAL HISTORY: Past Surgical History:  Procedure Laterality Date   CATARACT EXTRACTION W/PHACO Left 01/18/2021   Procedure: CATARACT EXTRACTION PHACO AND INTRAOCULAR LENS PLACEMENT (IOC) LEFT 7.40 01:11.7;  Surgeon: Lockie Mola, MD;  Location: [facility];  Service: Ophthalmology;  Laterality: Left;   CATARACT EXTRACTION W/PHACO Right 02/01/2021   Procedure: CATARACT EXTRACTION PHACO AND INTRAOCULAR LENS PLACEMENT (IOC) RIGHT EYHANCE TORIC 8.60 01:09.9 ;  Surgeon: Lockie Mola, MD;  Location: [facility];  Service: Ophthalmology;  Laterality: Right;   CHOLECYSTECTOMY     COLONOSCOPY  01/01/2006   COLONOSCOPY WITH PROPOFOL N/A 08/23/2015   Procedure: COLONOSCOPY WITH PROPOFOL;  Surgeon: Earline Mayotte, MD;  Location: [facility];  Service: Endoscopy;  Laterality: N/A;   HIATAL HERNIA REPAIR  2019    MEDICATIONS: Current Outpatient Medications  on File Prior to Visit  Medication Sig Dispense Refill   atenolol (TENORMIN) 50 MG tablet Take 1 tablet by mouth once daily 90 tablet 1   Biotin 03833 MCG TABS Take by mouth.     calcium carbonate (OS-CAL) 600 MG TABS tablet Take 600 mg by mouth daily with breakfast.     Cholecalciferol (VITAMIN D3 PO) Take by mouth daily.     Flaxseed, Linseed, (FLAXSEED OIL) 1000 MG CAPS Take by mouth.     lisinopril (ZESTRIL) 10 MG tablet Take 1 tablet (10 mg total) by mouth daily. 90 tablet 3   Magnesium 400 MG CAPS Take by mouth.     NON FORMULARY Probiotic daily.     nortriptyline (PAMELOR) 50 MG capsule Take 1 capsule (50 mg total) by mouth at bedtime. 90 capsule 3   omeprazole (PRILOSEC) 40 MG capsule Take 1 capsule (40 mg total) by mouth 2 (two) times daily. 180 capsule 1   perphenazine (TRILAFON) 4 MG tablet TAKE 1 TABLET BY MOUTH AT BEDTIME 90 tablet 3   potassium chloride (KLOR-CON) 10 MEQ tablet Take 1 tablet by mouth once daily 90 tablet 1   simvastatin (ZOCOR) 40 MG tablet Take 1 tablet (40 mg total) by mouth at bedtime. 90 tablet 1   vitamin E 1000 UNIT capsule Take 1,000 Units by mouth daily.     Zinc Sulfate (ZINC 15 PO) Take by mouth.     No current facility-administered medications on file prior to visit.    ALLERGIES: No Known Allergies  FAMILY HISTORY: Family History  Problem Relation Age of Onset   Arrhythmia Mother    Heart attack Father    Stroke Father    Healthy Sister    Healthy Brother    Healthy Sister    Breast cancer Neg Hx     SOCIAL HISTORY: Social History   Socioeconomic History   Marital status: Widowed    Spouse name: Not on file   Number of children: 0   Years of education: College   Highest education level: Bachelor's degree (e.g., BA, AB, BS)  Occupational History   Occupation: Retired  Tobacco Use   Smoking status: Never   Smokeless tobacco: Never  Vaping Use   Vaping Use: Never used  Substance and Sexual Activity   Alcohol use: No   Drug  use: No   Sexual activity: Not on file  Other Topics Concern   Not on file  Social History Narrative   Right handed   No caffeine   One story home   Social Determinants of Health   Financial Resource Strain: Not on file  Food Insecurity: Not on file  Transportation Needs: Not on file  Physical Activity: Not on file  Stress: Not on file  Social Connections: Not on file  Intimate Partner Violence: Not on file     PHYSICAL EXAM: Vitals:   07/05/21 0959  BP: (!) 187/125  Pulse: 74  Resp: 20  SpO2: 95%   General: No acute distress Head:  Normocephalic/atraumatic Skin/Extremities: No rash, no edema Neurological Exam: Mental status: alert and oriented to person, place, and time, no dysarthria or aphasia, Fund of knowledge is appropriate.  Recent and remote memory are intact, 2/3 delayed recall. Attention and concentration are normal, 5/5 WORLD backwards.  Cranial nerves: CN I: not tested CN II: pupils equal, round and reactive to light,  visual fields intact CN III, IV, VI:  full range of motion, no nystagmus, no ptosis CN V: facial sensation intact CN VII: upper and lower face symmetric CN VIII: hearing intact to conversation Bulk & Tone: normal, no fasciculations. Motor: 5/5 throughout with no pronator drift. Sensation: intact to light touch, cold, pin, vibration sense.  No extinction to double simultaneous stimulation.  Romberg test negative Deep Tendon Reflexes: +1 throughout Cerebellar: no incoordination on finger to nose testing Gait: narrow-based and steady, no ataxia Tremor: none   IMPRESSION: This is a 76 year old right-handed woman with a history of hypertension, hyperlipidemia, depression, presenting for evaluation of loss of consciousness with shaking. Symptoms suggestive of vasovagal syncope with convulsive syncope, less likely epileptic seizure. We discussed doing MRI brain and MRA head without contrast to assess for underlying structural abnormality or  flow-limiting stenosis. Schedule 1-hour EEG, if normal, we will do a 24-hour EEG. [place] driving laws were discussed with the patient, and she knows to stop driving after an episode of loss of consciousness until 6 months event-free. Follow-up with Cardiology as scheduled. She will follow-up after tests, call for any changes.    Thank you for allowing me to participate in the care of this patient. Please do not hesitate to call for any questions or concerns.   Patrcia Dolly, M.D.  CC: Merita Norton, FNP

## 2021-07-06 ENCOUNTER — Telehealth: Payer: Self-pay | Admitting: Cardiology

## 2021-07-06 NOTE — Telephone Encounter (Signed)
Patient was seen in office on 05/05/22 by Dr. Azucena Cecil with the following Plan documented (after 1st syncopal episode):  Syncope, appears vasovagal with prodrome of nausea, flushing.  Prior orthostatic vitals with no evidence for orthostasis.  Cardiac monitor with occasional paroxysmal SVT, no significant findings to suggest etiology of syncope.  Echo with EF 60 to 65%, no gross structural abnormalities.  Hypertension, BP slightly elevated, usually controlled.  Continue lisinopril 10 mg daily.   Follow-up as needed   At patients visit with Neurology on 07/05/21, the following was ordered:  Schedule MRI brain without contrast, MRA head without contrast   2. Schedule 1-hour EEG, if normal we will do a 24-hour EEG   3. Please let your cardiologist know about this recent episode of loss of consciousness  Will route to Dr. Nelma Rothman as an [place].

## 2021-07-06 NOTE — Telephone Encounter (Signed)
Pt c/o Syncope: STAT if syncope occurred within 30 minutes and pt complains of lightheadedness High Priority if episode of passing out, completely, today or in last 24 hours   Did you pass out today? No 1/21 at church    When is the last time you passed out? 1/21   Has this occurred multiple times? 2nd episode   Did you have any symptoms prior to passing out? Upset stomach prior to episode   already seen by neuro yesterday and advised to let [facility] know about 2nd episode as an [place]

## 2021-07-07 ENCOUNTER — Other Ambulatory Visit: Payer: Self-pay

## 2021-07-07 ENCOUNTER — Encounter: Payer: Self-pay | Admitting: Family Medicine

## 2021-07-07 ENCOUNTER — Telehealth (INDEPENDENT_AMBULATORY_CARE_PROVIDER_SITE_OTHER): Payer: Medicare Other | Admitting: Family Medicine

## 2021-07-07 ENCOUNTER — Telehealth: Payer: Self-pay

## 2021-07-07 DIAGNOSIS — I10 Essential (primary) hypertension: Secondary | ICD-10-CM

## 2021-07-07 DIAGNOSIS — Z5982 Transportation insecurity: Secondary | ICD-10-CM | POA: Diagnosis not present

## 2021-07-07 DIAGNOSIS — R55 Syncope and collapse: Secondary | ICD-10-CM

## 2021-07-07 MED ORDER — LISINOPRIL 10 MG PO TABS: 20.0000 mg | ORAL_TABLET | Freq: Every day | ORAL | 3 refills | Status: DC

## 2021-07-07 NOTE — Assessment & Plan Note (Signed)
Elevation, recommend increase of lisinopril to 20 mg Call back on Tuesday to clinic with records from 2/10 going fwd Currently denies SOB, DOE, dizziness, LOC, etc Does not have access to pill splitter

## 2021-07-07 NOTE — Progress Notes (Signed)
Virtual Telephone Visit    Virtual Visit via Video Note   This visit type was conducted due to national recommendations for restrictions regarding the COVID-19 Pandemic (e.g. social distancing) in an effort to limit this patient's exposure and mitigate transmission in our community. This patient is at least at moderate risk for complications without adequate follow up. This format is felt to be most appropriate for this patient at this time. Physical exam was limited by quality of the video and audio technology used for the visit.   Patient location: home Provider location: [facility] I discussed the limitations of evaluation and management by telemedicine and the availability of in person appointments. The patient expressed understanding and agreed to proceed.   Patient: Jessica Little   DOB: 1945-11-09   76 y.o. Female  MRN: [id number] Visit Date: 07/07/2021  Today's healthcare provider: Gwyneth Sprout, FNP   Chief Complaint  Patient presents with   Hypertension   Subjective    HPI  Hypertension, follow-up  BP Readings from Last 3 Encounters:  07/05/21 (!) 187/125  05/05/21 (!) 144/86  05/04/21 (!) 148/83   Wt Readings from Last 3 Encounters:  07/05/21 173 lb (78.5 kg)  05/05/21 170 lb 6.4 oz (77.3 kg)  05/04/21 170 lb (77.1 kg)     She was last seen for hypertension 3 months ago.  BP at that visit was 176/95. Management since that visit includes hold potassium supplement for 3 days.  She reports excellent compliance with treatment. She is not having side effects.  She is following a Regular diet. She is not exercising. She does not smoke.  Use of agents associated with hypertension: none.   Outside blood pressures are elevated. BP 187/105, 173/96, 175/98, 187/97. Symptoms: No chest pain No chest pressure  No palpitations No syncope  No dyspnea No orthopnea  No paroxysmal nocturnal dyspnea No lower extremity edema   Pertinent labs: Lab Results  Component Value  Date   CHOL 192 05/23/2020   HDL 57 05/23/2020   LDLCALC 107 (H) 05/23/2020   TRIG 160 (H) 05/23/2020   CHOLHDL 3.7 02/25/2019   Lab Results  Component Value Date   NA 142 03/28/2021   K 4.6 03/28/2021   CREATININE 1.00 05/03/2021   EGFR 45 (L) 03/28/2021   GLUCOSE 92 03/28/2021   TSH 2.300 05/23/2020     The 10-year ASCVD risk score (Arnett DK, et al., 2019) is: 33.1%   ---------------------------------------------------------------------------------------------------    Medications: Outpatient Medications Prior to Visit  Medication Sig   atenolol (TENORMIN) 50 MG tablet Take 1 tablet by mouth once daily   Biotin 10000 MCG TABS Take by mouth.   calcium carbonate (OS-CAL) 600 MG TABS tablet Take 600 mg by mouth daily with breakfast.   Cholecalciferol (VITAMIN D3 PO) Take by mouth daily.   Flaxseed, Linseed, (FLAXSEED OIL) 1000 MG CAPS Take by mouth.   Magnesium 400 MG CAPS Take by mouth.   NON FORMULARY Probiotic daily.   nortriptyline (PAMELOR) 50 MG capsule Take 1 capsule (50 mg total) by mouth at bedtime.   omeprazole (PRILOSEC) 40 MG capsule Take 1 capsule (40 mg total) by mouth 2 (two) times daily.   perphenazine (TRILAFON) 4 MG tablet TAKE 1 TABLET BY MOUTH AT BEDTIME   potassium chloride (KLOR-CON) 10 MEQ tablet Take 1 tablet by mouth once daily   simvastatin (ZOCOR) 40 MG tablet Take 1 tablet (40 mg total) by mouth at bedtime.   vitamin E 1000 UNIT capsule  Take 1,000 Units by mouth daily.   Zinc Sulfate (ZINC 15 PO) Take by mouth.   [DISCONTINUED] lisinopril (ZESTRIL) 10 MG tablet Take 1 tablet (10 mg total) by mouth daily.   No facility-administered medications prior to visit.    Review of Systems  Constitutional:  Negative for appetite change and fatigue.  Respiratory:  Negative for chest tightness.       Objective    There were no vitals taken for this visit. BP Readings from Last 3 Encounters:  07/05/21 (!) 187/125  05/05/21 (!) 144/86  05/04/21 (!)  148/83   Wt Readings from Last 3 Encounters:  07/05/21 173 lb (78.5 kg)  05/05/21 170 lb 6.4 oz (77.3 kg)  05/04/21 170 lb (77.1 kg)      Physical Exam Pulmonary:     Effort: Pulmonary effort is normal.  Neurological:     Mental Status: She is alert.  Psychiatric:        Mood and Affect: Mood normal.        Behavior: Behavior normal.        Thought Content: Thought content normal.        Judgment: Judgment normal.     Comments: Pt concerned about HTN and RF CVA       Assessment & Plan     Problem List Items Addressed This Visit       Cardiovascular and Mediastinum   Primary hypertension - Primary    Elevation, recommend increase of lisinopril to 20 mg Call back on Tuesday to clinic with records from 2/10 going fwd Currently denies SOB, DOE, dizziness, LOC, etc Does not have access to pill splitter      Relevant Medications   lisinopril (ZESTRIL) 10 MG tablet   Other Relevant Orders   AMB Referral to [facility]     Other   Syncope and collapse    Evaluated at neuro MRI ordered  EEG ordered      Lack of access to transportation    CCM referral placed to assist given 6 month hold on driving given syncope protocol         Return in about 1 week (around 07/14/2021) for HTN management- will call .     I discussed the assessment and treatment plan with the patient. The patient was provided an opportunity to ask questions and all were answered. The patient agreed with the plan and demonstrated an understanding of the instructions.   The patient was advised to call back or seek an in-person evaluation if the symptoms worsen or if the condition fails to improve as anticipated.  I provided 12 minutes of non-face-to-face time during this encounter.  Vonna Kotyk, FNP, have reviewed all documentation for this visit. The documentation on 07/07/21 for the exam, diagnosis, procedures, and orders are all accurate and complete.   Gwyneth Sprout,  [place] [phone] (phone) [phone] (fax)  [place]

## 2021-07-07 NOTE — Assessment & Plan Note (Signed)
CCM referral placed to assist given 6 month hold on driving given syncope protocol

## 2021-07-07 NOTE — Telephone Encounter (Signed)
° °  Telephone encounter was:  Successful.  07/07/2021 Name: Jessica Little MRN: [id number] DOB: 21-Jul-1945  Jessica Little is a 76 y.o. year old female who is a primary care patient of Jacky Kindle, FNP . The community resource team was consulted for assistance with Transportation Needs   Care guide performed the following interventions: Spoke with patient about [employer] transportation [phone] which only operates within [facility].  I asked her if she needed transportation to her 2/28 appointment and she stated she has a friend that would take her.  She stated she did not need transportation assistance at this time.  Follow Up Plan:  No further follow up planned at this time. The patient has been provided with needed resources.  Robinn Overholt, AAS Paralegal, [facility] Care Guide  Embedded Care Coordination [place]   Care Management  [address] [place], [place] [address] ??[email or web address]   ?? [phone]   [email or web address]

## 2021-07-07 NOTE — Assessment & Plan Note (Signed)
Evaluated at neuro MRI ordered  EEG ordered

## 2021-07-12 ENCOUNTER — Other Ambulatory Visit: Payer: Self-pay

## 2021-07-12 ENCOUNTER — Ambulatory Visit: Payer: Self-pay | Admitting: *Deleted

## 2021-07-12 ENCOUNTER — Telehealth: Payer: Self-pay | Admitting: Family Medicine

## 2021-07-12 DIAGNOSIS — K2100 Gastro-esophageal reflux disease with esophagitis, without bleeding: Secondary | ICD-10-CM

## 2021-07-12 MED ORDER — OMEPRAZOLE 40 MG PO CPDR: 40.0000 mg | DELAYED_RELEASE_CAPSULE | Freq: Two times a day (BID) | ORAL | 1 refills | Status: DC

## 2021-07-12 NOTE — Telephone Encounter (Signed)
[facility] pharmacy faxed refill request for the following medications:  omeprazole (PRILOSEC) 40 MG capsule   Please advise

## 2021-07-12 NOTE — Telephone Encounter (Signed)
Summary: discuss elevate blood pressure   Pt stated her blood pressure continues to be elevated even with the increase in dosage of her medication. Pt requests call back to discuss. Cb# [phone]        Chief Complaint: elevated BP  Symptoms: BP 167/91 HR 56 rechecked for 173/100 HR 57. Using automatic wrist cuff. C/o headache but getting better. Frequency: today  Pertinent Negatives: Patient denies chest pain difficulty breathing, no blurred vision, dizziness decrease in balance. No weakness on either side of body .  Disposition: [] ED /[] Urgent Care (no appt availability in office) / [x] Appointment(In office/virtual)/ []  [facility]/ [] Home Care/ [] Refused Recommended Disposition /[] [facility]/ []  Follow-up with PCP Additional Notes:   Reports recent change in lisinopril and not helping BP to decrease. Recommended to increase water intake and recheck BP this pm and report to PCP at appt tomorrow.  Recommended if symptoms worsen go to ED for evaluation     Reason for Disposition  Systolic BP  >= 160 OR Diastolic >= 100  Answer Assessment - Initial Assessment Questions 1. BLOOD PRESSURE: "What is the blood pressure?" "Did you take at least two measurements 5 minutes apart?"     BP 167/91 HR 56 and rechecked for BP 173/100 HR 57. 2. ONSET: "When did you take your blood pressure?"     Now  3. HOW: "How did you obtain the blood pressure?" (e.g., visiting nurse, automatic home BP monitor)     Automatic BP wrist cuff  4. HISTORY: "Do you have a history of high blood pressure?"     Yes  5. MEDICATIONS: "Are you taking any medications for blood pressure?" "Have you missed any doses recently?"     Yes , increased does of medication Friday  6. OTHER SYMPTOMS: "Do you have any symptoms?" (e.g., headache, chest pain, blurred vision, difficulty breathing, weakness)    Headache getting better  7. PREGNANCY: "Is there any chance you are pregnant?" "When was your last  menstrual period?"     na  Protocols used: Blood Pressure - High-A-AH

## 2021-07-13 ENCOUNTER — Telehealth: Payer: Self-pay | Admitting: Neurology

## 2021-07-13 ENCOUNTER — Ambulatory Visit (INDEPENDENT_AMBULATORY_CARE_PROVIDER_SITE_OTHER): Payer: Medicare Other | Admitting: Family Medicine

## 2021-07-13 ENCOUNTER — Encounter: Payer: Self-pay | Admitting: Family Medicine

## 2021-07-13 ENCOUNTER — Other Ambulatory Visit: Payer: Self-pay

## 2021-07-13 VITALS — BP 176/96 | HR 64 | Temp 97.7°F | Resp 16 | Wt 171.8 lb

## 2021-07-13 DIAGNOSIS — I10 Essential (primary) hypertension: Secondary | ICD-10-CM | POA: Diagnosis not present

## 2021-07-13 NOTE — Progress Notes (Signed)
°  ° ° °Established patient visit ° ° °Patient: Jessica Little   DOB: 09/07/1945   75 y.o. Female  MRN: [id number] °Visit Date: 07/13/2021 ° °Today's healthcare provider:  , MD  ° °Chief Complaint  °Patient presents with  ° Hypertension  ° °Subjective  °  °HPI  °Hypertension, follow-up ° °BP Readings from Last 3 Encounters:  °07/13/21 (!) 176/96  °07/05/21 (!) 187/125  °05/05/21 (!) 144/86  ° Wt Readings from Last 3 Encounters:  °07/13/21 171 lb 12.8 oz (77.9 kg)  °07/05/21 173 lb (78.5 kg)  °05/05/21 170 lb 6.4 oz (77.3 kg)  °  ° °She was last seen for hypertension 6 days ago.  °BP at that visit was 187/125. Management since that visit includes increase Lisinopril to 20mg daily. ° °She reports excellent compliance with treatment. °She is not having side effects.  °She is following a Regular diet. °She is exercising. °She does not smoke. ° °Use of agents associated with hypertension: none.  ° °Outside blood pressures are elevated highest 181/97 yesterday. ° °Under a lot of stress that she feels is contributing ° °Symptoms: °No chest pain No chest pressure  °No palpitations No syncope  °No dyspnea No orthopnea  °No paroxysmal nocturnal dyspnea Yes lower extremity edema  ° °Pertinent labs: °Lab Results  °Component Value Date  ° CHOL 192 05/23/2020  ° HDL 57 05/23/2020  ° LDLCALC 107 (H) 05/23/2020  ° TRIG 160 (H) 05/23/2020  ° CHOLHDL 3.7 02/25/2019  ° Lab Results  °Component Value Date  ° NA 142 03/28/2021  ° K 4.6 03/28/2021  ° CREATININE 1.00 05/03/2021  ° EGFR 45 (L) 03/28/2021  ° GLUCOSE 92 03/28/2021  ° TSH 2.300 05/23/2020  °  ° °The 10-year ASCVD risk score (Arnett DK, et al., 2019) is: 29.9%  ° °--------------------------------------------------------------------------------------------------- ° ° °Medications: °Outpatient Medications Prior to Visit  °Medication Sig  ° atenolol (TENORMIN) 50 MG tablet Take 1 tablet by mouth once daily  ° Biotin 10000 MCG TABS Take by mouth.  ° calcium  carbonate (OS-CAL) 600 MG TABS tablet Take 600 mg by mouth daily with breakfast.  ° Cholecalciferol (VITAMIN D3 PO) Take by mouth daily.  ° Flaxseed, Linseed, (FLAXSEED OIL) 1000 MG CAPS Take by mouth.  ° lisinopril (ZESTRIL) 10 MG tablet Take 2 tablets (20 mg total) by mouth daily.  ° Magnesium 400 MG CAPS Take by mouth.  ° NON FORMULARY Probiotic daily.  ° nortriptyline (PAMELOR) 50 MG capsule Take 1 capsule (50 mg total) by mouth at bedtime.  ° omeprazole (PRILOSEC) 40 MG capsule Take 1 capsule (40 mg total) by mouth 2 (two) times daily.  ° perphenazine (TRILAFON) 4 MG tablet TAKE 1 TABLET BY MOUTH AT BEDTIME  ° potassium chloride (KLOR-CON) 10 MEQ tablet Take 1 tablet by mouth once daily  ° simvastatin (ZOCOR) 40 MG tablet Take 1 tablet (40 mg total) by mouth at bedtime.  ° vitamin E 1000 UNIT capsule Take 1,000 Units by mouth daily.  ° Zinc Sulfate (ZINC 15 PO) Take by mouth.  ° °No facility-administered medications prior to visit.  ° ° °Review of Systems  °Constitutional:  Negative for appetite change and fatigue.  °Eyes:  Negative for visual disturbance.  °Respiratory:  Negative for chest tightness and shortness of breath.   °Cardiovascular:  Positive for leg swelling. Negative for chest pain and palpitations.  °Neurological:  Positive for headaches.  ° °  °  Objective  °  °BP (!) 176/96 (BP Location: Left   Arm, Patient Position: Sitting, Cuff Size: Large)    Pulse 64    Temp 97.7 °F (36.5 °C) (Temporal)    Resp 16    Wt 171 lb 12.8 oz (77.9 kg)    SpO2 99%    BMI 27.73 kg/m²  °BP Readings from Last 3 Encounters:  °07/13/21 (!) 176/96  °07/05/21 (!) 187/125  °05/05/21 (!) 144/86  ° °Wt Readings from Last 3 Encounters:  °07/13/21 171 lb 12.8 oz (77.9 kg)  °07/05/21 173 lb (78.5 kg)  °05/05/21 170 lb 6.4 oz (77.3 kg)  ° °  ° °Physical Exam °Vitals reviewed.  °Constitutional:   °   General: She is not in acute distress. °   Appearance: Normal appearance. She is well-developed. She is not diaphoretic.  °HENT:  °    Head: Normocephalic and atraumatic.  °Eyes:  °   General: No scleral icterus. °   Conjunctiva/sclera: Conjunctivae normal.  °Neck:  °   Thyroid: No thyromegaly.  °Cardiovascular:  °   Rate and Rhythm: Normal rate and regular rhythm.  °   Pulses: Normal pulses.  °   Heart sounds: Normal heart sounds. No murmur heard. °Pulmonary:  °   Effort: Pulmonary effort is normal. No respiratory distress.  °   Breath sounds: Normal breath sounds. No wheezing, rhonchi or rales.  °Musculoskeletal:  °   Cervical back: Neck supple.  °   Right lower leg: No edema.  °   Left lower leg: No edema.  °Lymphadenopathy:  °   Cervical: No cervical adenopathy.  °Skin: °   General: Skin is warm and dry.  °   Findings: No rash.  °Neurological:  °   Mental Status: She is alert and oriented to person, place, and time. Mental status is at baseline.  °Psychiatric:     °   Mood and Affect: Mood normal.     °   Behavior: Behavior normal.  °  ° ° °No results found for any visits on 07/13/21. ° Assessment & Plan  °  ° °Problem List Items Addressed This Visit   ° °  ° Cardiovascular and Mediastinum  ° Primary hypertension - Primary  °  Chronic and uncontrolled °Lisinopril dose was titrated <1 wk ago °Explained to patient that we haven't given the new dose enough time to see full effect yet °She has no red flag symptoms °Will continue atenolol and lisinopril at current doses °Continue to monitor home BP °Check back ~1 month after dose titration and consider increasing lisinopril to 40 mg daily °Check BMP °  °  ° Relevant Orders  ° Basic Metabolic Panel (BMET)  °  ° °Return in about 3 weeks (around 08/03/2021) for BP f/u.  °   ° °I,  , MD, have reviewed all documentation for this visit. The documentation on 07/13/21 for the exam, diagnosis, procedures, and orders are all accurate and complete. ° ° °,  M, MD, MPH °[facility] °[facility]   °

## 2021-07-13 NOTE — Telephone Encounter (Signed)
Pt called an given the number to [facility] [id number] so she can schedule her MRI and MRA

## 2021-07-13 NOTE — Telephone Encounter (Signed)
Patient was seen this morning.

## 2021-07-13 NOTE — Assessment & Plan Note (Signed)
Chronic and uncontrolled Lisinopril dose was titrated <1 wk ago Explained to patient that we haven't given the new dose enough time to see full effect yet She has no red flag symptoms Will continue atenolol and lisinopril at current doses Continue to monitor home BP Check back ~1 month after dose titration and consider increasing lisinopril to 40 mg daily Check BMP

## 2021-07-13 NOTE — Telephone Encounter (Signed)
Pt said she has not heard from anyone about getting an MRI and MRA.

## 2021-07-14 LAB — BASIC METABOLIC PANEL WITH GFR
BUN/Creatinine Ratio: 16 (ref 12–28)
BUN: 20 mg/dL (ref 8–27)
CO2: 18 mmol/L — ABNORMAL LOW (ref 20–29)
Calcium: 9.5 mg/dL (ref 8.7–10.3)
Chloride: 105 mmol/L (ref 96–106)
Creatinine, Ser: 1.29 mg/dL — ABNORMAL HIGH (ref 0.57–1.00)
Glucose: 66 mg/dL — ABNORMAL LOW (ref 70–99)
Potassium: 4.1 mmol/L (ref 3.5–5.2)
Sodium: 142 mmol/L (ref 134–144)
eGFR: 44 mL/min/{1.73_m2} — ABNORMAL LOW

## 2021-07-19 ENCOUNTER — Telehealth: Payer: Self-pay | Admitting: Neurology

## 2021-07-19 NOTE — Telephone Encounter (Signed)
Patient called and stated that she has an MRI coming up.  When she was here the dr asked her if she was claustrophobic.  She has been thinking about it and wanted to see if she can get some medicine for it.  She uses [facility] on [address] in [place].

## 2021-07-24 NOTE — Telephone Encounter (Signed)
Pls check if she has taken anything in the past, has she taken Valium in the past? thanks

## 2021-07-25 ENCOUNTER — Ambulatory Visit (INDEPENDENT_AMBULATORY_CARE_PROVIDER_SITE_OTHER): Payer: Medicare Other | Admitting: Neurology

## 2021-07-25 ENCOUNTER — Other Ambulatory Visit: Payer: Self-pay

## 2021-07-25 DIAGNOSIS — R55 Syncope and collapse: Secondary | ICD-10-CM

## 2021-07-25 MED ORDER — DIAZEPAM 5 MG PO TABS: ORAL_TABLET | ORAL | 0 refills | Status: DC

## 2021-07-25 NOTE — Telephone Encounter (Signed)
Called patient and unable to leave message due to phone just ringing and no voicemail.

## 2021-07-25 NOTE — Telephone Encounter (Signed)
Pt called an informed that Dr Karel Jarvis sent in Valium 5mg  to take 30 mins prior to MRI, may take second dose if needed. She needs to have someone drive her to MRI

## 2021-07-25 NOTE — Telephone Encounter (Signed)
Pls let her know I sent in Valium 5mg  to take 30 mins prior to MRI, may take second dose if needed. She needs to have someone drive her to MRI, thanks

## 2021-07-25 NOTE — Addendum Note (Signed)
Addended by: Van Clines on: 07/25/2021 11:50 AM   Modules accepted: Orders

## 2021-07-25 NOTE — Telephone Encounter (Signed)
Patient is here for her EEG and said she has never taken valium. She would like something for her claustrophobia for her MRI please. Please call into [facility] pharmacy on file.

## 2021-07-27 ENCOUNTER — Other Ambulatory Visit: Payer: Self-pay

## 2021-07-27 ENCOUNTER — Ambulatory Visit
Admission: RE | Admit: 2021-07-27 | Discharge: 2021-07-27 | Disposition: A | Discharge status: Home / Self Care | Payer: Medicare Other | Source: Ambulatory Visit | Attending: Neurology | Admitting: Neurology

## 2021-07-27 DIAGNOSIS — R55 Syncope and collapse: Secondary | ICD-10-CM

## 2021-07-27 DIAGNOSIS — I6602 Occlusion and stenosis of left middle cerebral artery: Secondary | ICD-10-CM | POA: Diagnosis not present

## 2021-07-27 IMAGING — MR MR MRA HEAD W/O CM
2 series · 12 of 48 positions shown · non-contrast
Comparison: Prior head CT from [DATE].

CLINICAL DATA: Initial evaluation for syncope.

EXAM:
MRI HEAD WITHOUT CONTRAST
MRA HEAD WITHOUT CONTRAST
TECHNIQUE: Multiplanar, multi-echo pulse sequences of the brain and surrounding
structures were acquired without intravenous contrast. Angiographic
images of the Circle of Willis were acquired using MRA technique
without intravenous contrast.

[Series 15: [id number]_multi-slab · axial · 0.6mm · 0.26mm/px · z∈[-50,+21]mm · 4 of 149 slices shown]
[im 8/149]
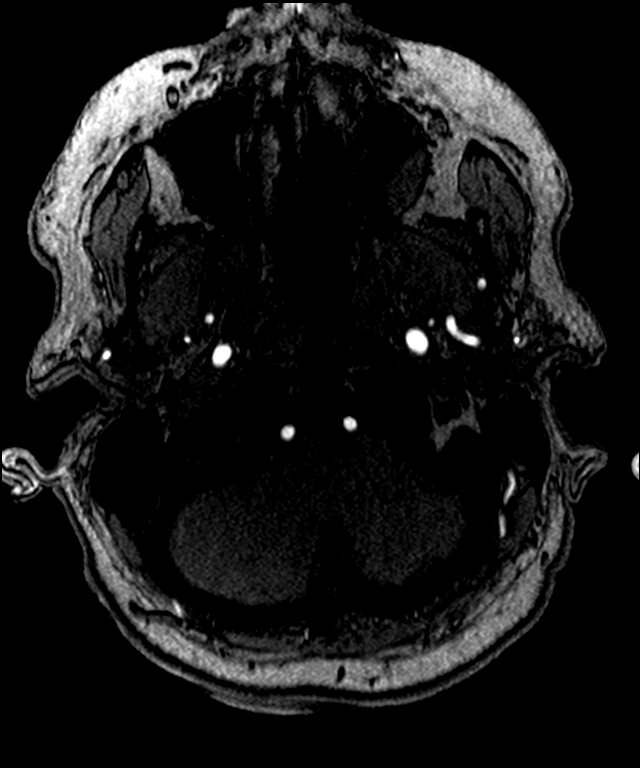
[im 23/149]
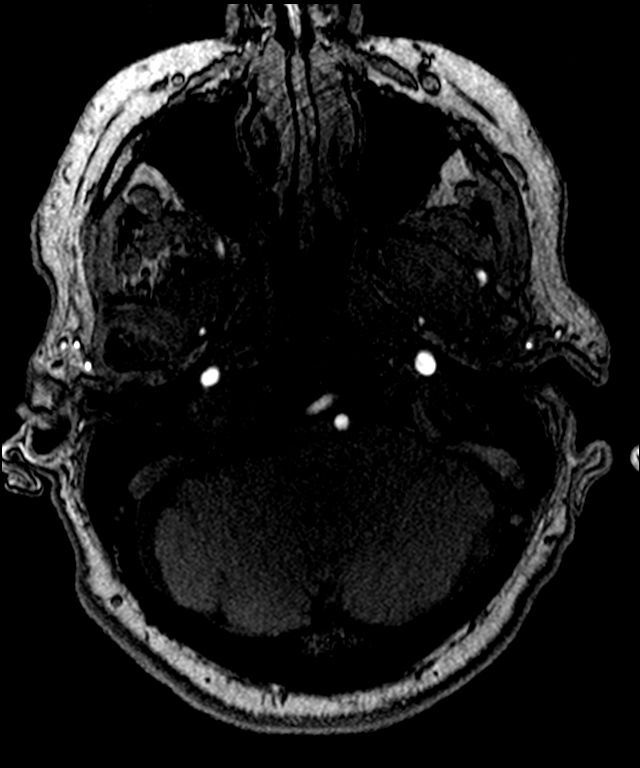
[im 76/149]
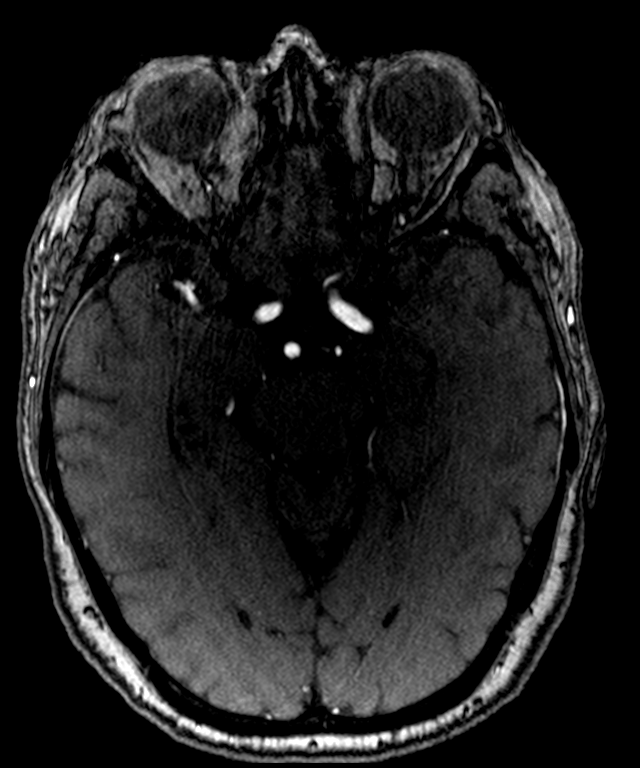
[im 126/149]
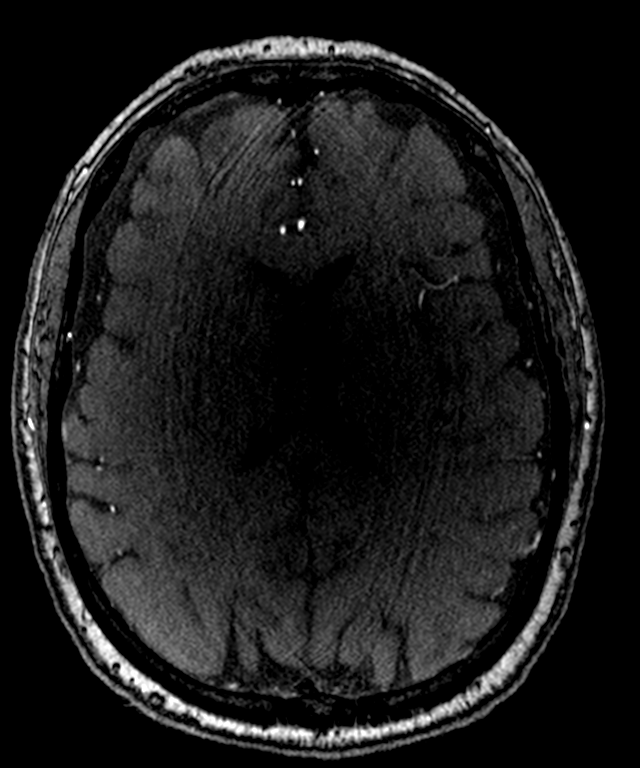

[Series 19: T2 · coronal · 4.5mm · 0.36mm/px · 8 of 32 slices shown]
[im 1/32]
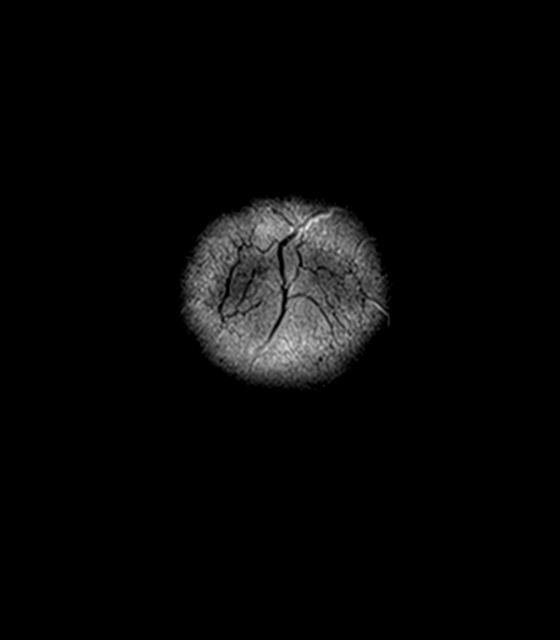
[im 5/32]
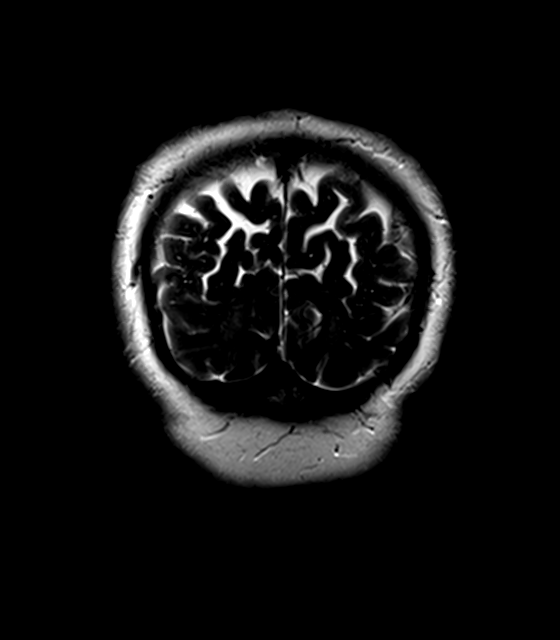
[im 9/32]
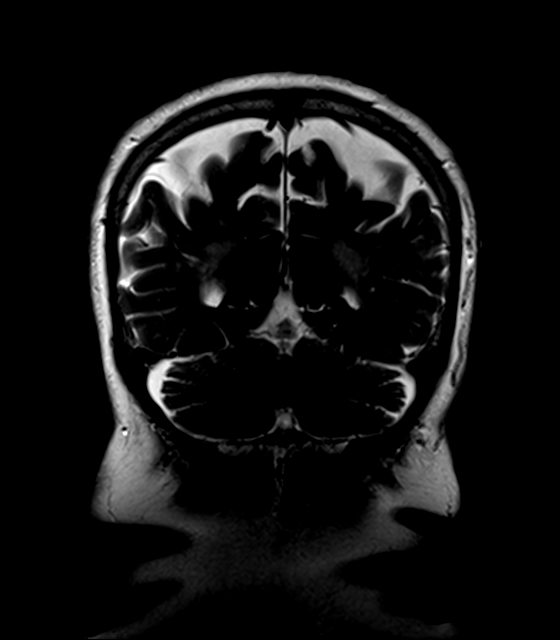
[im 14/32]
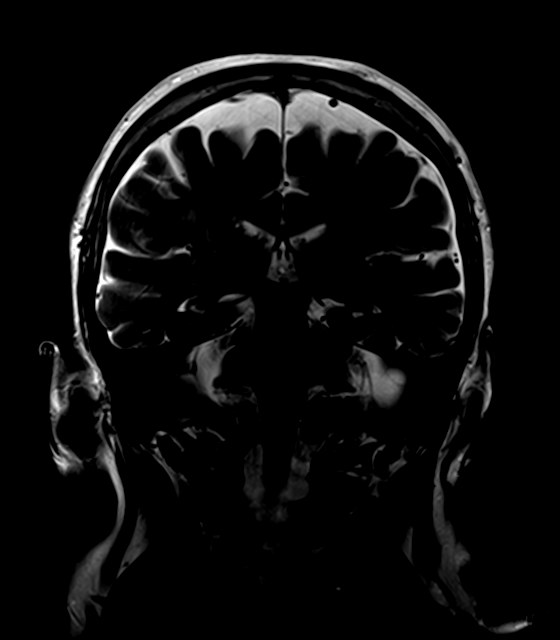
[im 18/32]
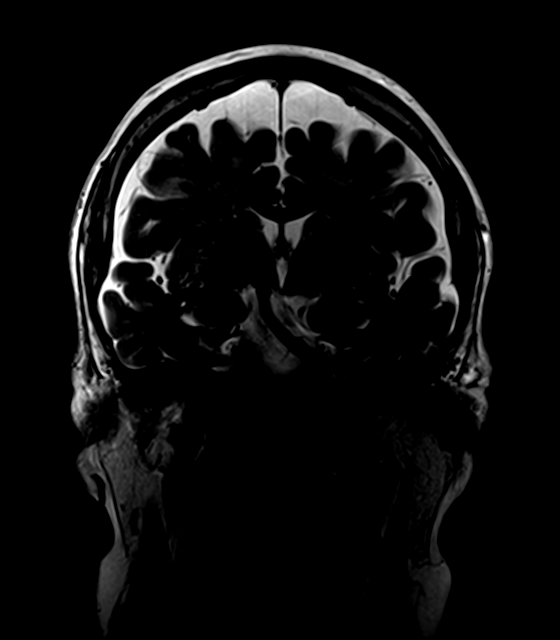
[im 23/32]
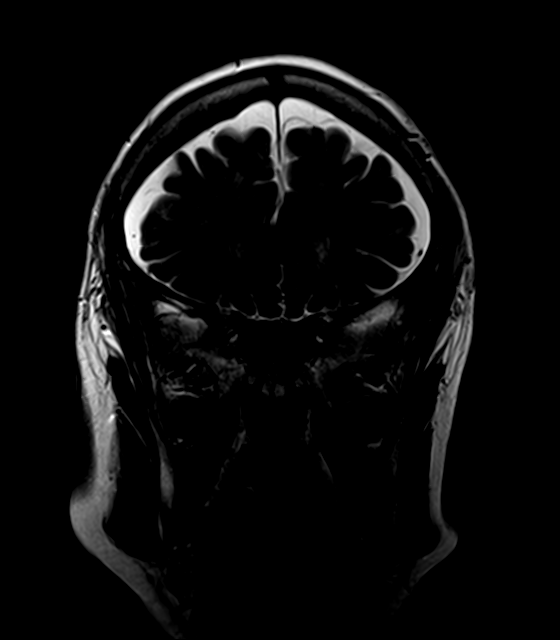
[im 27/32]
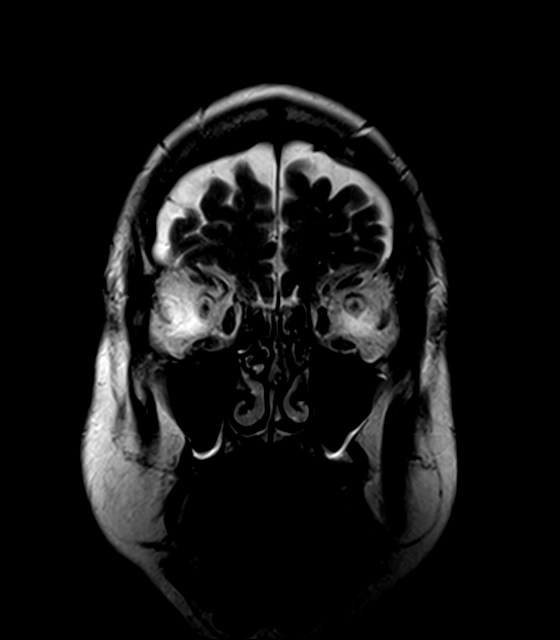
[im 32/32]
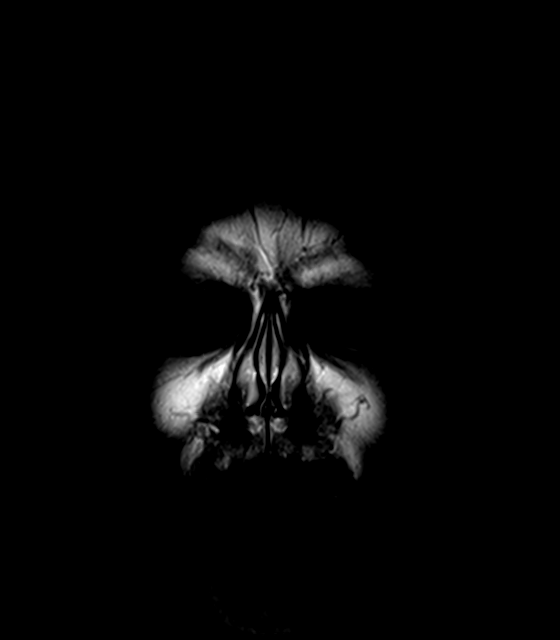

[12 of 48 positions shown; findings below may reference images not displayed]

FINDINGS: MRI HEAD FINDINGS

Brain: Diffuse prominence of the CSF containing spaces compatible
generalized cerebral atrophy. Patchy and confluent T2/FLAIR
hyperintensity involving the periventricular deep white matter both
cerebral hemispheres as well as the pons, most characteristic of
chronic microvascular ischemic disease, moderately advanced in
nature.

No abnormal foci of restricted diffusion to suggest acute or
subacute ischemia. Gray-white matter differentiation maintained. No
encephalomalacia to suggest chronic cortical infarction or other
insult. No acute intracranial hemorrhage. Few punctate chronic micro
hemorrhages noted, likely hypertensive/small vessel related.

No mass lesion, midline shift or mass effect. Ventricles normal size
without hydrocephalus. No extra-axial fluid collection. Empty sella
noted. Suprasellar region normal. Midline structures intact.

Vascular: Major intracranial vascular flow voids are well
maintained.

Skull and upper cervical spine: Craniocervical junction within
normal limits. Bone marrow signal intensity normal. No scalp soft
tissue abnormality.

Sinuses/Orbits: Patient status post bilateral ocular lens
replacement. Globes and orbital soft tissues demonstrate no acute
finding. Mild scattered mucosal thickening noted within the
ethmoidal air cells and maxillary sinuses. Paranasal sinuses are
otherwise clear. No mastoid effusion. Inner ear structures grossly
normal.

Other: None.

MRA HEAD FINDINGS

Anterior circulation: Visualized distal cervical segments of the
internal carotid arteries are widely patent with antegrade flow.
Petrous, cavernous, and supraclinoid segments widely patent
bilaterally. Left A1 dominant and widely patent. Right A1
hypoplastic and grossly patent as well. Normal anterior
communicating artery complex. Widely patent as it gets ACA noted.
Right M1 segment widely patent. Short-segment mild to moderate
stenosis noted at the origin of the left M1 segment (series 102,
image 1). Left M1 otherwise widely patent as well. Normal MCA
bifurcations. No proximal MCA branch occlusion. Distal small vessel
atheromatous irregularity noted throughout the MCA branches
bilaterally.

Posterior circulation: Both V4 segments patent to the
vertebrobasilar junction without stenosis. Both PICA patent at their
origins. Basilar patent to its distal aspect without stenosis. Tiny
2 mm outpouching arising from the proximal basilar artery most
consistent with a small vascular infundibulum related to the left
AICA. Superior cerebellar arteries patent bilaterally. Right PCA
supplied via the basilar. Fetal type origin of the left PCA. Both
PCAs mildly irregular but patent to their distal aspects without
high-grade stenosis.

Anatomic variants: As above.  No aneurysm.
IMPRESSION: MRI HEAD IMPRESSION:

1. No acute intracranial abnormality.
2. Moderately advanced cerebral atrophy with chronic microvascular
ischemic disease.
3. Empty sella.

MRA HEAD IMPRESSION:

1. Negative intracranial MRA for large vessel occlusion.
2. Mild to moderate short-segment stenosis at the origin of the left
M1 segment. No other proximal high-grade or correctable stenosis.
3. Distal small vessel atheromatous irregularity throughout the MCA
and PCA branches bilaterally.

## 2021-07-27 IMAGING — MR MR HEAD W/O CM
11 series · 48 of 48 positions shown · non-contrast
Comparison: Prior head CT from [DATE].

CLINICAL DATA: Initial evaluation for syncope.

EXAM:
MRI HEAD WITHOUT CONTRAST
MRA HEAD WITHOUT CONTRAST
TECHNIQUE: Multiplanar, multi-echo pulse sequences of the brain and surrounding
structures were acquired without intravenous contrast. Angiographic
images of the Circle of Willis were acquired using MRA technique
without intravenous contrast.

[Series 6: T1 · sagittal · 4.0mm · 0.75mm/px · 2 of 31 slices shown (1 of 2)]
[im 1/31]
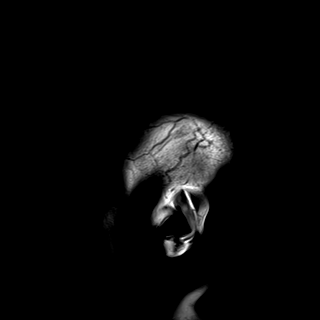
[im 31/31]
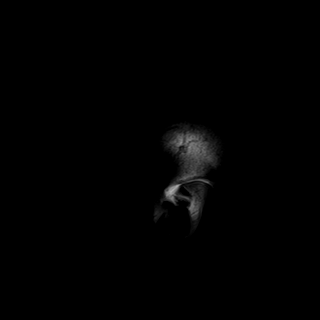

[Series 7: DWI · axial · 3.0mm · 0.94mm/px · z∈[-67,+74]mm · 10 of 158 slices shown (1 of 3)]
[im 1/158]
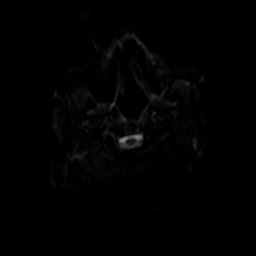
[im 18/158]
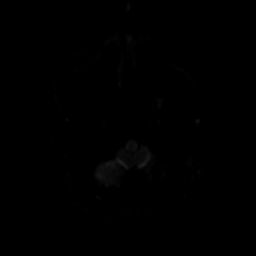
[im 35/158]
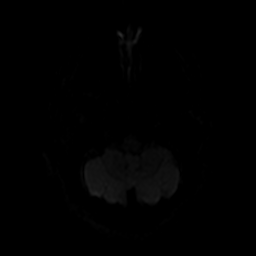
[im 53/158]
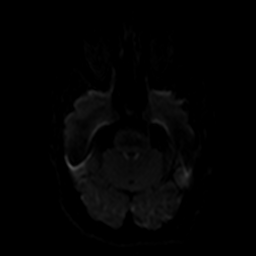
[im 70/158]
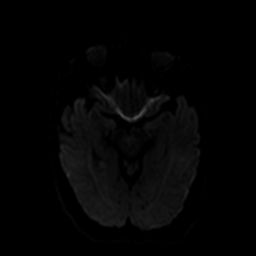
[im 88/158]
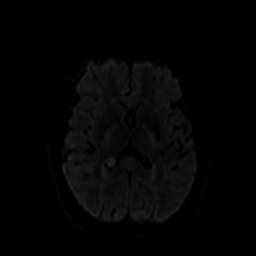
[im 105/158]
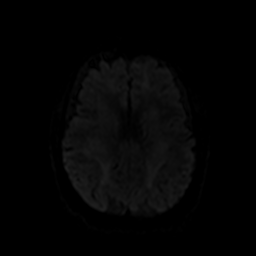
[im 123/158]
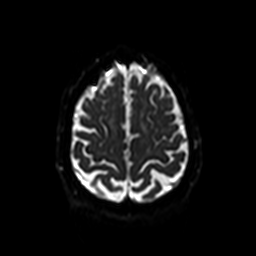
[im 140/158]
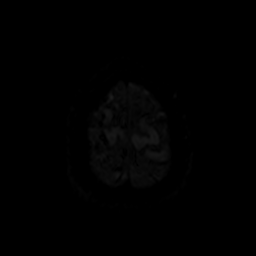
[im 158/158]
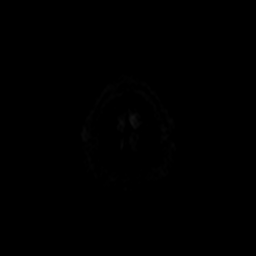

[Series 8: ax dwi_tracew · axial · 3.0mm · 0.94mm/px · z∈[-67,+74]mm · 5 of 78 slices shown]
[im 1/78]
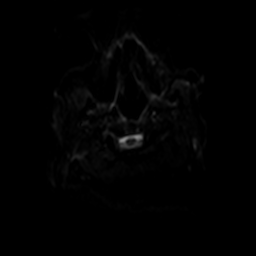
[im 20/78]
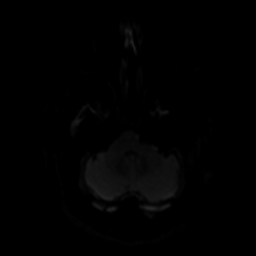
[im 39/78]
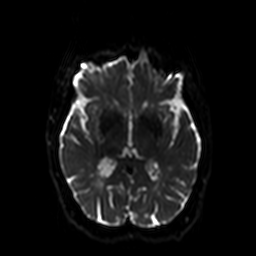
[im 58/78]
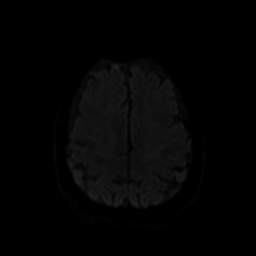
[im 78/78]
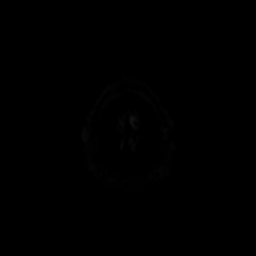

[Series 9: ax dwi_adc · axial · 3.0mm · 0.94mm/px · z∈[-67,+74]mm · 2 of 39 slices shown]
[im 1/39]
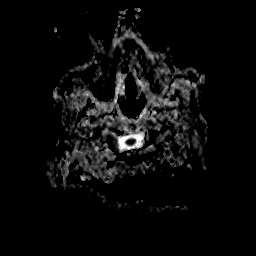
[im 39/39]
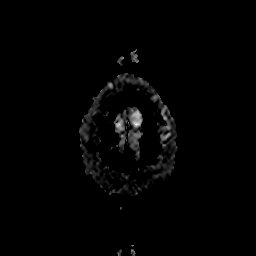

[Series 10: DWI · coronal · 5.0mm · 1.44mm/px · 4 of 66 slices shown (2 of 3)]
[im 1/66]
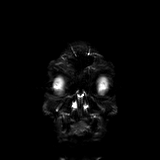
[im 22/66]
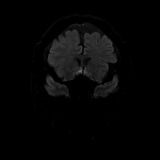
[im 44/66]
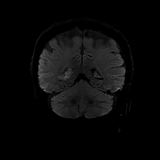
[im 66/66]
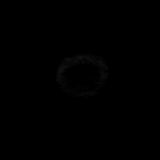

[Series 11: DWI · coronal · 5.0mm · 1.44mm/px · 2 of 33 slices shown (3 of 3)]
[im 1/33]
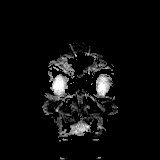
[im 33/33]
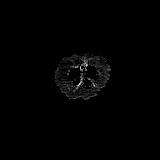

[Series 12: T2 · axial · 3.0mm · 0.36mm/px · z∈[-66,+74]mm · 3 of 40 slices shown]
[im 1/40]
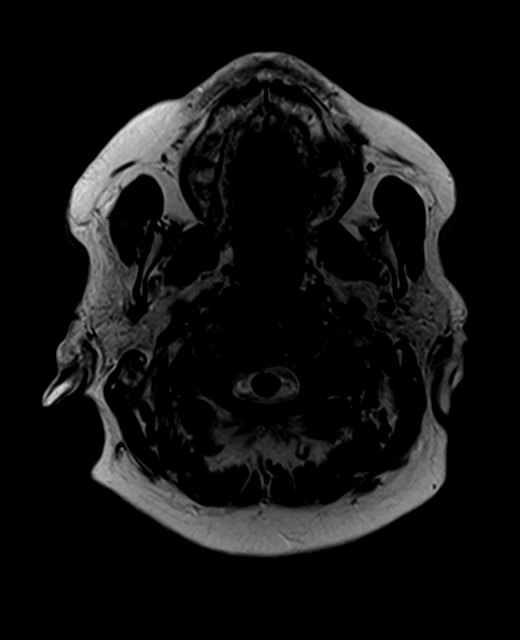
[im 20/40]
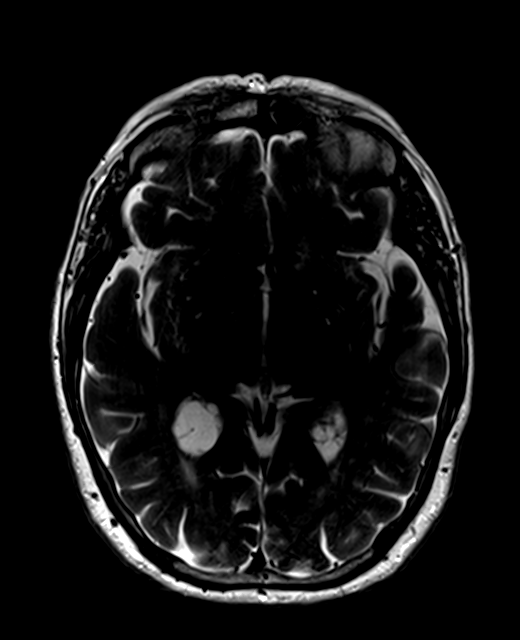
[im 40/40]
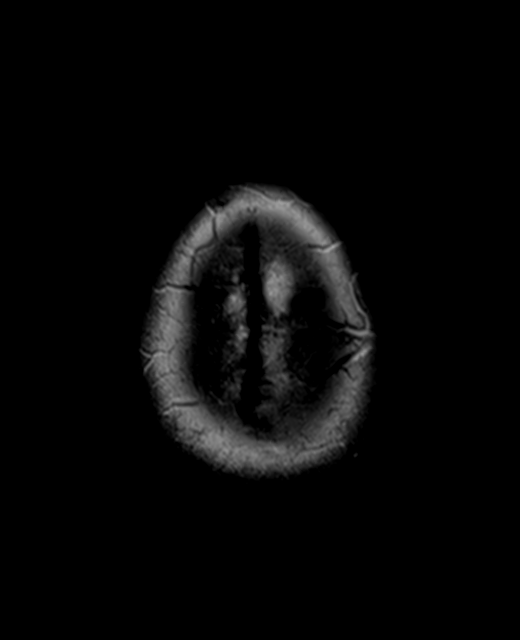

[Series 13: FLAIR · axial · 3.0mm · 0.72mm/px · z∈[-72,+78]mm · 2 of 26 slices shown]
[im 1/26]
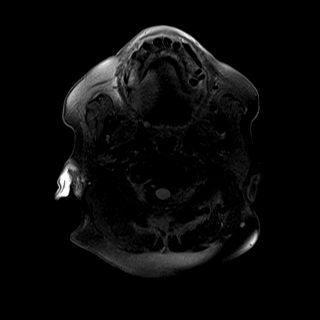
[im 26/26]
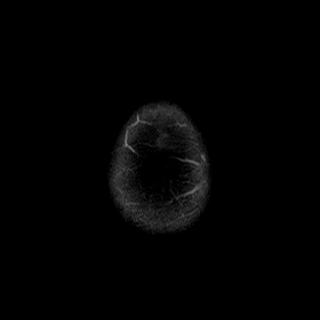

[Series 14: mip_images(sw) · axial · 24.0mm · 0.90mm/px · z∈[-80,+88]mm · 4 of 57 slices shown]
[im 1/57]
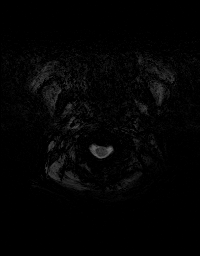
[im 19/57]
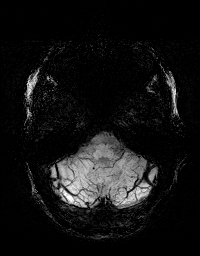
[im 38/57]
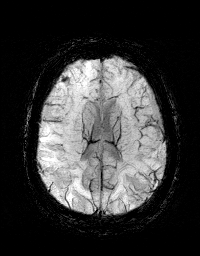
[im 57/57]
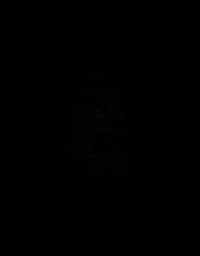

[Series 15: swi_images · axial · 3.0mm · 0.90mm/px · z∈[-91,+98]mm · 4 of 64 slices shown]
[im 1/64]
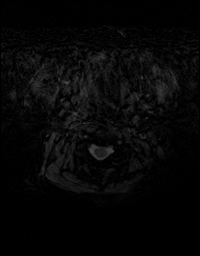
[im 22/64]
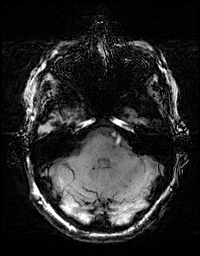
[im 43/64]
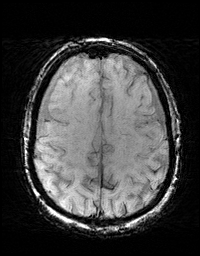
[im 64/64]
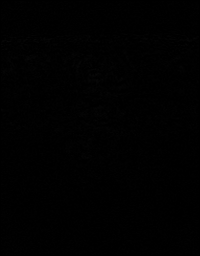

[Series 16: T1 · axial · 1.0mm · 0.90mm/px · z∈[-81,+78]mm · 10 of 160 slices shown (2 of 2)]
[im 1/160]
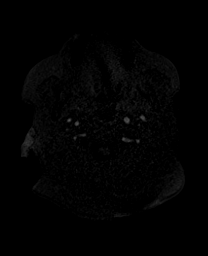
[im 18/160]
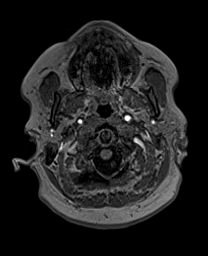
[im 36/160]
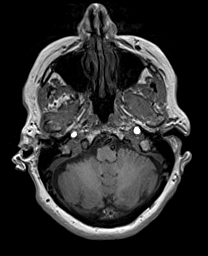
[im 54/160]
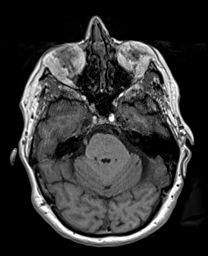
[im 71/160]
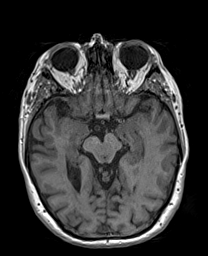
[im 89/160]
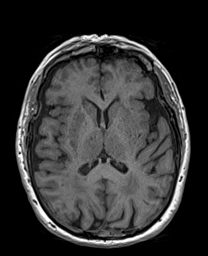
[im 107/160]
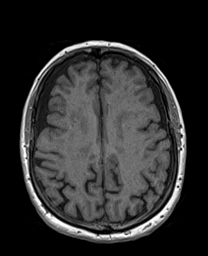
[im 124/160]
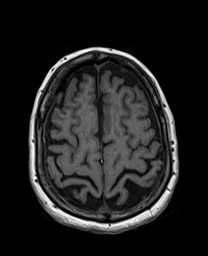
[im 142/160]
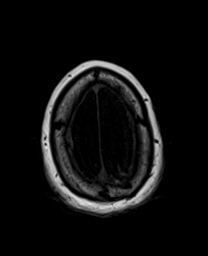
[im 160/160]
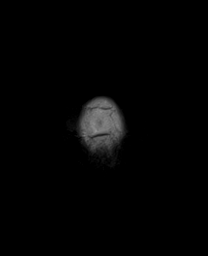

[48 of 48 positions shown; findings below may reference images not displayed]

FINDINGS: MRI HEAD FINDINGS

Brain: Diffuse prominence of the CSF containing spaces compatible
generalized cerebral atrophy. Patchy and confluent T2/FLAIR
hyperintensity involving the periventricular deep white matter both
cerebral hemispheres as well as the pons, most characteristic of
chronic microvascular ischemic disease, moderately advanced in
nature.

No abnormal foci of restricted diffusion to suggest acute or
subacute ischemia. Gray-white matter differentiation maintained. No
encephalomalacia to suggest chronic cortical infarction or other
insult. No acute intracranial hemorrhage. Few punctate chronic micro
hemorrhages noted, likely hypertensive/small vessel related.

No mass lesion, midline shift or mass effect. Ventricles normal size
without hydrocephalus. No extra-axial fluid collection. Empty sella
noted. Suprasellar region normal. Midline structures intact.

Vascular: Major intracranial vascular flow voids are well
maintained.

Skull and upper cervical spine: Craniocervical junction within
normal limits. Bone marrow signal intensity normal. No scalp soft
tissue abnormality.

Sinuses/Orbits: Patient status post bilateral ocular lens
replacement. Globes and orbital soft tissues demonstrate no acute
finding. Mild scattered mucosal thickening noted within the
ethmoidal air cells and maxillary sinuses. Paranasal sinuses are
otherwise clear. No mastoid effusion. Inner ear structures grossly
normal.

Other: None.

MRA HEAD FINDINGS

Anterior circulation: Visualized distal cervical segments of the
internal carotid arteries are widely patent with antegrade flow.
Petrous, cavernous, and supraclinoid segments widely patent
bilaterally. Left A1 dominant and widely patent. Right A1
hypoplastic and grossly patent as well. Normal anterior
communicating artery complex. Widely patent as it gets ACA noted.
Right M1 segment widely patent. Short-segment mild to moderate
stenosis noted at the origin of the left M1 segment (series 102,
image 1). Left M1 otherwise widely patent as well. Normal MCA
bifurcations. No proximal MCA branch occlusion. Distal small vessel
atheromatous irregularity noted throughout the MCA branches
bilaterally.

Posterior circulation: Both V4 segments patent to the
vertebrobasilar junction without stenosis. Both PICA patent at their
origins. Basilar patent to its distal aspect without stenosis. Tiny
2 mm outpouching arising from the proximal basilar artery most
consistent with a small vascular infundibulum related to the left
AICA. Superior cerebellar arteries patent bilaterally. Right PCA
supplied via the basilar. Fetal type origin of the left PCA. Both
PCAs mildly irregular but patent to their distal aspects without
high-grade stenosis.

Anatomic variants: As above.  No aneurysm.
IMPRESSION: MRI HEAD IMPRESSION:

1. No acute intracranial abnormality.
2. Moderately advanced cerebral atrophy with chronic microvascular
ischemic disease.
3. Empty sella.

MRA HEAD IMPRESSION:

1. Negative intracranial MRA for large vessel occlusion.
2. Mild to moderate short-segment stenosis at the origin of the left
M1 segment. No other proximal high-grade or correctable stenosis.
3. Distal small vessel atheromatous irregularity throughout the MCA
and PCA branches bilaterally.

## 2021-07-30 DIAGNOSIS — S161XXA Strain of muscle, fascia and tendon at neck level, initial encounter: Secondary | ICD-10-CM | POA: Diagnosis not present

## 2021-07-31 NOTE — Procedures (Signed)
ELECTROENCEPHALOGRAM REPORT  Date of Study: 07/25/2021  Patient's Name: Jessica Little MRN: [id number] Date of Birth: 07/24/1945  Referring Provider: Dr. Patrcia Dolly  Clinical History: This is a 76 year old woman with episodes of loss of consciousness with shaking. EEG for classification.  Medications: TENORMIN 50 MG tablet Biotin 87564 MCG TABS OS-CAL 600 MG TABS tablet VITAMIN D3 PO FLAXSEED OIL ZESTRIL 10 MG tablet Magnesium 400 MG CAPS PAMELOR 50 MG capsuleProbiotic daily PRILOSEC 40 MG capsule TRILAFON 4 MG tablet KLOR-CON 10 MEQ tablet ZOCOR 40 MG tablet vitamin E 1000 UNIT capsule ZINC 15 PO  Technical Summary: A multichannel digital 1-hour EEG recording measured by the international 10-20 system with electrodes applied with paste and impedances below 5000 ohms performed in our laboratory with EKG monitoring in an awake and asleep patient.  Hyperventilation was not performed. Photic stimulation was performed.  The digital EEG was referentially recorded, reformatted, and digitally filtered in a variety of bipolar and referential montages for optimal display.    Description: The patient is awake and asleep during the recording.  During maximal wakefulness, there is a symmetric, medium voltage 10 Hz posterior dominant rhythm that attenuates with eye opening.  The record is symmetric.  During drowsiness and sleep, there is an increase in theta slowing of the background, at times sharply contoured over the left temporal region without clear epileptogenic potential.  Vertex waves and symmetric sleep spindles were seen.  Photic stimulation did not elicit any abnormalities.  There were no epileptiform discharges or electrographic seizures seen.    EKG lead was unremarkable.  Impression: This 1-hour awake and asleep EEG is within normal limits.  Clinical Correlation: A normal EEG does not exclude a clinical diagnosis of epilepsy.  If further clinical questions remain, prolonged  EEG may be helpful.  Clinical correlation is advised.   Patrcia Dolly, M.D.

## 2021-08-01 ENCOUNTER — Telehealth: Payer: Self-pay | Admitting: Neurology

## 2021-08-01 NOTE — Telephone Encounter (Signed)
Discussed results with patient. EEG was normal, proceed with prolonged EEG as scheduled. Brain MRI no acute changes, no stroke, tumor, or bleed. MRA showed a short-segment stenosis at origin of left M1 segment, this would not cause her symptoms. Discussed need for control of vascular risk factors and recommendation to start daily aspirin 81mg . Patient expressed understanding. She has been having diarrhea overnight and will speak to PCP if no improvement. ?

## 2021-08-03 ENCOUNTER — Ambulatory Visit (INDEPENDENT_AMBULATORY_CARE_PROVIDER_SITE_OTHER): Payer: Medicare Other | Admitting: Family Medicine

## 2021-08-03 ENCOUNTER — Encounter: Payer: Self-pay | Admitting: Family Medicine

## 2021-08-03 ENCOUNTER — Other Ambulatory Visit: Payer: Self-pay

## 2021-08-03 VITALS — BP 113/76 | HR 106 | Temp 97.7°F | Wt 159.0 lb

## 2021-08-03 DIAGNOSIS — R000 Tachycardia, unspecified: Secondary | ICD-10-CM

## 2021-08-03 DIAGNOSIS — E7800 Pure hypercholesterolemia, unspecified: Secondary | ICD-10-CM

## 2021-08-03 DIAGNOSIS — E162 Hypoglycemia, unspecified: Secondary | ICD-10-CM

## 2021-08-03 DIAGNOSIS — E876 Hypokalemia: Secondary | ICD-10-CM

## 2021-08-03 DIAGNOSIS — R634 Abnormal weight loss: Secondary | ICD-10-CM | POA: Diagnosis not present

## 2021-08-03 DIAGNOSIS — I10 Essential (primary) hypertension: Secondary | ICD-10-CM | POA: Diagnosis not present

## 2021-08-03 MED ORDER — SIMVASTATIN 40 MG PO TABS: 40.0000 mg | ORAL_TABLET | Freq: Every day | ORAL | 1 refills | Status: DC

## 2021-08-03 MED ORDER — LISINOPRIL 10 MG PO TABS: 20.0000 mg | ORAL_TABLET | Freq: Every day | ORAL | 1 refills | Status: DC

## 2021-08-03 NOTE — Assessment & Plan Note (Signed)
Repeat labs due to need for statin refill ?

## 2021-08-03 NOTE — Assessment & Plan Note (Addendum)
Chronic, improving- lisinopril refilled today ?Continue medication at current doses ?One episode of hypotension ?Hx of HA and GI bug ?Weight loss noted >10 lbs ?Will repeat labs ?Endorses dizziness, f/b neuro  ?

## 2021-08-03 NOTE — Assessment & Plan Note (Signed)
POC BG was in 60s, pt has been eating bit/pieces of fruits/vegetables ?Encouraged balanced diet, small-frequent meals/snacks ?

## 2021-08-03 NOTE — Assessment & Plan Note (Signed)
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 ?

## 2021-08-03 NOTE — Progress Notes (Signed)
?  ?I,Elena D DeSanto,acting as a scribe for  T , FNP.,have documented all relevant documentation on the behalf of  T , FNP,as directed by   T , FNP while in the presence of  T , FNP. ? ? ? ?Established patient visit ? ? ?Patient: Jessica Little   DOB: 05/12/1946   75 y.o. Female  MRN: [id number] ?Visit Date: 08/03/2021 ? ?Today's healthcare provider:  T , FNP  ? ?Re-Introduced to nurse practitioner role and practice setting.  All questions answered.  Discussed provider/patient relationship and expectations. ? ? ?No chief complaint on file. ? ?Subjective  ?  ?HPI  ?Hypertension, follow-up ? ?BP Readings from Last 3 Encounters:  ?08/03/21 113/76  ?07/13/21 (!) 176/96  ?07/05/21 (!) 187/125  ? Wt Readings from Last 3 Encounters:  ?08/03/21 159 lb (72.1 kg)  ?07/13/21 171 lb 12.8 oz (77.9 kg)  ?07/05/21 173 lb (78.5 kg)  ?  ? ?She was last seen for hypertension 3 weeks ago.  ?BP at that visit was as above. Management since that visit includes increased Lisinopril. ? ?She reports good compliance with treatment. ?She is not having side effects.  ?She is following a Regular diet. ?She is not exercising. ?She does not smoke. ? ?Use of agents associated with hypertension: none.  ? ?Outside blood pressures are ranging 150-190 over with 60-90.  She had one reading of 91/52, when she had a headache. ?Symptoms: ?No chest pain No chest pressure  ?No palpitations No syncope  ?No dyspnea No orthopnea  ?No paroxysmal nocturnal dyspnea No lower extremity edema  ? ?Pertinent labs: ?Lab Results  ?Component Value Date  ? CHOL 192 05/23/2020  ? HDL 57 05/23/2020  ? LDLCALC 107 (H) 05/23/2020  ? TRIG 160 (H) 05/23/2020  ? CHOLHDL 3.7 02/25/2019  ? Lab Results  ?Component Value Date  ? NA 142 07/13/2021  ? K 4.1 07/13/2021  ? CREATININE 1.29 (H) 07/13/2021  ? EGFR 44 (L) 07/13/2021  ? GLUCOSE 66 (L) 07/13/2021  ? TSH 2.300 05/23/2020  ?  ? ?The 10-year ASCVD risk score (Arnett DK, et al.,  2019) is: 13.5%  ? ?--------------------------------------------------------------------------------------------------- ? ? ?Medications: ?Outpatient Medications Prior to Visit  ?Medication Sig  ? atenolol (TENORMIN) 50 MG tablet Take 1 tablet by mouth once daily  ? Biotin 10000 MCG TABS Take by mouth.  ? calcium carbonate (OS-CAL) 600 MG TABS tablet Take 600 mg by mouth daily with breakfast.  ? Cholecalciferol (VITAMIN D3 PO) Take by mouth daily.  ? diazepam (VALIUM) 5 MG tablet Take 1 tablet 30 mins prior to MRI. May take second dose if needed  ? Flaxseed, Linseed, (FLAXSEED OIL) 1000 MG CAPS Take by mouth.  ? Magnesium 400 MG CAPS Take by mouth.  ? NON FORMULARY Probiotic daily.  ? nortriptyline (PAMELOR) 50 MG capsule Take 1 capsule (50 mg total) by mouth at bedtime.  ? omeprazole (PRILOSEC) 40 MG capsule Take 1 capsule (40 mg total) by mouth 2 (two) times daily.  ? perphenazine (TRILAFON) 4 MG tablet TAKE 1 TABLET BY MOUTH AT BEDTIME  ? potassium chloride (KLOR-CON) 10 MEQ tablet Take 1 tablet by mouth once daily  ? vitamin E 1000 UNIT capsule Take 1,000 Units by mouth daily.  ? Zinc Sulfate (ZINC 15 PO) Take by mouth.  ? [DISCONTINUED] lisinopril (ZESTRIL) 10 MG tablet Take 2 tablets (20 mg total) by mouth daily.  ? [DISCONTINUED] simvastatin (ZOCOR) 40 MG tablet Take 1 tablet (40 mg total) by mouth at   bedtime.  ? ?No facility-administered medications prior to visit.  ? ? ?Review of Systems ? ?  ?  Objective  ?  ?BP 113/76 (BP Location: Right Arm, Patient Position: Sitting, Cuff Size: Normal)   Pulse (!) 106   Temp 97.7 ?F (36.5 ?C) (Oral)   Wt 159 lb (72.1 kg)   SpO2 100%   BMI 25.66 kg/m?  ?  ? ?Physical Exam ?Vitals and nursing note reviewed.  ?Constitutional:   ?   General: She is not in acute distress. ?   Appearance: Normal appearance. She is overweight. She is not ill-appearing, toxic-appearing or diaphoretic.  ?HENT:  ?   Head: Normocephalic and atraumatic.  ?Cardiovascular:  ?   Rate and Rhythm:  Normal rate and regular rhythm.  ?   Pulses: Normal pulses.  ?   Heart sounds: Normal heart sounds. No murmur heard. ?  No friction rub. No gallop.  ?Pulmonary:  ?   Effort: Pulmonary effort is normal. No respiratory distress.  ?   Breath sounds: Normal breath sounds. No stridor. No wheezing, rhonchi or rales.  ?Chest:  ?   Chest wall: No tenderness.  ?Abdominal:  ?   General: Bowel sounds are normal.  ?   Palpations: Abdomen is soft.  ?Musculoskeletal:     ?   General: No swelling, tenderness, deformity or signs of injury. Normal range of motion.  ?   Right lower leg: No edema.  ?   Left lower leg: No edema.  ?Skin: ?   General: Skin is warm and dry.  ?   Capillary Refill: Capillary refill takes less than 2 seconds.  ?   Coloration: Skin is not jaundiced or pale.  ?   Findings: No bruising, erythema, lesion or rash.  ?Neurological:  ?   General: No focal deficit present.  ?   Mental Status: She is alert and oriented to person, place, and time. Mental status is at baseline.  ?   Cranial Nerves: No cranial nerve deficit.  ?   Sensory: No sensory deficit.  ?   Motor: No weakness.  ?   Coordination: Coordination normal.  ?Psychiatric:     ?   Mood and Affect: Mood normal.     ?   Behavior: Behavior normal.     ?   Thought Content: Thought content normal.     ?   Judgment: Judgment normal.  ?  ? ? ?No results found for any visits on 08/03/21. ? Assessment & Plan  ?  ? ?Problem List Items Addressed This Visit   ? ?  ? Cardiovascular and Mediastinum  ? Primary hypertension - Primary  ?  Chronic, improving- lisinopril refilled today ?Continue medication at current doses ?One episode of hypotension ?Hx of HA and GI bug ?Weight loss noted >10 lbs ?Will repeat labs ?Endorses dizziness, f/b neuro  ?  ?  ? Relevant Medications  ? simvastatin (ZOCOR) 40 MG tablet  ? lisinopril (ZESTRIL) 10 MG tablet  ? Other Relevant Orders  ? CBC with Differential/Platelet  ?  ? Endocrine  ? Hypoglycemia  ?  POC BG was in 60s, pt has been  eating bit/pieces of fruits/vegetables ?Encouraged balanced diet, small-frequent meals/snacks ?  ?  ? Relevant Orders  ? Hemoglobin A1c  ?  ? Other  ? HLD (hyperlipidemia)  ?  Repeat labs due to need for statin refill ?  ?  ? Relevant Medications  ? simvastatin (ZOCOR) 40 MG tablet  ? lisinopril (ZESTRIL) 10   MG tablet  ? Other Relevant Orders  ? Lipid panel  ? Hypokalemia  ?  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 ?  ?  ? Relevant Orders  ? Comprehensive metabolic panel  ? Tachycardia with heart rate 100-120 beats per minute  ?  Potential for dehydration given recent GI bug; however, unexplained weight loss ?  ?  ? Unexplained weight loss  ?  >10# weight loss seen in 3 wk ?  ?  ? ? ? ?Return in about 4 weeks (around 08/31/2021) for annual examination- [place] via phone, then CPE with me 1 month later.  ?   ? ?I, Gwyneth Sprout, FNP, have reviewed all documentation for this visit. The documentation on 08/03/21 for the exam, diagnosis, procedures, and orders are all accurate and complete. ? ? ? ?Gwyneth Sprout, FNP  ?[place] ?[phone] (phone) ?[phone] (fax) ? ?[facility] ?

## 2021-08-03 NOTE — Assessment & Plan Note (Signed)
>  10# weight loss seen in 3 wk ?

## 2021-08-03 NOTE — Assessment & Plan Note (Signed)
Potential for dehydration given recent GI bug; however, unexplained weight loss ?

## 2021-08-04 LAB — CBC WITH DIFFERENTIAL/PLATELET
Basophils Absolute: 0 10*3/uL (ref 0.0–0.2)
Basos: 1 %
EOS (ABSOLUTE): 0.1 10*3/uL (ref 0.0–0.4)
Eos: 2 %
Hematocrit: 45.7 % (ref 34.0–46.6)
Hemoglobin: 14.7 g/dL (ref 11.1–15.9)
Immature Grans (Abs): 0 10*3/uL (ref 0.0–0.1)
Immature Granulocytes: 0 %
Lymphocytes Absolute: 1.9 10*3/uL (ref 0.7–3.1)
Lymphs: 26 %
MCH: 25.3 pg — ABNORMAL LOW (ref 26.6–33.0)
MCHC: 32.2 g/dL (ref 31.5–35.7)
MCV: 79 fL (ref 79–97)
Monocytes Absolute: 1 10*3/uL — ABNORMAL HIGH (ref 0.1–0.9)
Monocytes: 14 %
Neutrophils Absolute: 4.2 10*3/uL (ref 1.4–7.0)
Neutrophils: 57 %
Platelets: 281 10*3/uL (ref 150–450)
RBC: 5.82 x10E6/uL — ABNORMAL HIGH (ref 3.77–5.28)
RDW: 15.7 % — ABNORMAL HIGH (ref 11.7–15.4)
WBC: 7.2 10*3/uL (ref 3.4–10.8)

## 2021-08-04 LAB — LIPID PANEL
Chol/HDL Ratio: 3.3 ratio (ref 0.0–4.4)
Cholesterol, Total: 176 mg/dL (ref 100–199)
HDL: 53 mg/dL (ref >39)
LDL Chol Calc (NIH): 95 mg/dL (ref 0–99)
Triglycerides: 164 mg/dL — ABNORMAL HIGH (ref 0–149)
VLDL Cholesterol Cal: 28 mg/dL (ref 5–40)

## 2021-08-04 LAB — COMPREHENSIVE METABOLIC PANEL
ALT: 18 IU/L (ref 0–32)
AST: 25 IU/L (ref 0–40)
Albumin/Globulin Ratio: 1.8 (ref 1.2–2.2)
Albumin: 4.5 g/dL (ref 3.7–4.7)
Alkaline Phosphatase: 65 IU/L (ref 44–121)
BUN/Creatinine Ratio: 28 (ref 12–28)
BUN: 51 mg/dL — ABNORMAL HIGH (ref 8–27)
Bilirubin Total: 0.5 mg/dL (ref 0.0–1.2)
CO2: 19 mmol/L — ABNORMAL LOW (ref 20–29)
Calcium: 9.5 mg/dL (ref 8.7–10.3)
Chloride: 101 mmol/L (ref 96–106)
Creatinine, Ser: 1.83 mg/dL — ABNORMAL HIGH (ref 0.57–1.00)
Globulin, Total: 2.5 g/dL (ref 1.5–4.5)
Glucose: 102 mg/dL — ABNORMAL HIGH (ref 70–99)
Potassium: 4 mmol/L (ref 3.5–5.2)
Sodium: 139 mmol/L (ref 134–144)
Total Protein: 7 g/dL (ref 6.0–8.5)
eGFR: 29 mL/min/{1.73_m2} — ABNORMAL LOW (ref >59)

## 2021-08-04 LAB — HEMOGLOBIN A1C
Est. average glucose Bld gHb Est-mCnc: 117 mg/dL
Hgb A1c MFr Bld: 5.7 % — ABNORMAL HIGH (ref 4.8–5.6)

## 2021-08-07 ENCOUNTER — Ambulatory Visit (INDEPENDENT_AMBULATORY_CARE_PROVIDER_SITE_OTHER): Payer: Medicare Other

## 2021-08-07 VITALS — Wt 159.0 lb

## 2021-08-07 DIAGNOSIS — Z0000 Encounter for general adult medical examination without abnormal findings: Secondary | ICD-10-CM

## 2021-08-07 NOTE — Patient Instructions (Signed)
[name] , Thank you for taking time to come for your Medicare Wellness Visit. I appreciate your ongoing commitment to your health goals. Please review the following plan we discussed and let me know if I can assist you in the future.   Screening recommendations/referrals: Colonoscopy: 08/23/15 Mammogram: 06/02/20, 'has referral' Bone Density: 01/02/17, declined referral Recommended yearly ophthalmology/optometry visit for glaucoma screening and checkup Recommended yearly dental visit for hygiene and checkup  Vaccinations: Influenza vaccine: n/d Pneumococcal vaccine: 10/05/16 Tdap vaccine: 10/24/00, due Shingles vaccine: n/d   Covid-19:10/14/19, 11/11/19  Advanced directives: no  Conditions/risks identified: none  Next appointment: Follow up in one year for your annual wellness visit 08/09/22 @ 1pm by phone   Preventive Care 65 Years and Older, Female Preventive care refers to lifestyle choices and visits with your health care provider that can promote health and wellness. What does preventive care include? A yearly physical exam. This is also called an annual well check. Dental exams once or twice a year. Routine eye exams. Ask your health care provider how often you should have your eyes checked. Personal lifestyle choices, including: Daily care of your teeth and gums. Regular physical activity. Eating a healthy diet. Avoiding tobacco and drug use. Limiting alcohol use. Practicing safe sex. Taking low-dose aspirin every day. Taking vitamin and mineral supplements as recommended by your health care provider. What happens during an annual well check? The services and screenings done by your health care provider during your annual well check will depend on your age, overall health, lifestyle risk factors, and family history of disease. Counseling  Your health care provider may ask you questions about your: Alcohol use. Tobacco use. Drug use. Emotional well-being. Home and  relationship well-being. Sexual activity. Eating habits. History of falls. Memory and ability to understand (cognition). Work and work Astronomer. Reproductive health. Screening  You may have the following tests or measurements: Height, weight, and BMI. Blood pressure. Lipid and cholesterol levels. These may be checked every 5 years, or more frequently if you are over 33 years old. Skin check. Lung cancer screening. You may have this screening every year starting at age 37 if you have a 30-pack-year history of smoking and currently smoke or have quit within the past 15 years. Fecal occult blood test (FOBT) of the stool. You may have this test every year starting at age 7. Flexible sigmoidoscopy or colonoscopy. You may have a sigmoidoscopy every 5 years or a colonoscopy every 10 years starting at age 74. Hepatitis C blood test. Hepatitis B blood test. Sexually transmitted disease (STD) testing. Diabetes screening. This is done by checking your blood sugar (glucose) after you have not eaten for a while (fasting). You may have this done every 1-3 years. Bone density scan. This is done to screen for osteoporosis. You may have this done starting at age 74. Mammogram. This may be done every 1-2 years. Talk to your health care provider about how often you should have regular mammograms. Talk with your health care provider about your test results, treatment options, and if necessary, the need for more tests. Vaccines  Your health care provider may recommend certain vaccines, such as: Influenza vaccine. This is recommended every year. Tetanus, diphtheria, and acellular pertussis (Tdap, Td) vaccine. You may need a Td booster every 10 years. Zoster vaccine. You may need this after age 67. Pneumococcal 13-valent conjugate (PCV13) vaccine. One dose is recommended after age 28. Pneumococcal polysaccharide (PPSV23) vaccine. One dose is recommended after age 43. Talk to  your health care provider  about which screenings and vaccines you need and how often you need them. This information is not intended to replace advice given to you by your health care provider. Make sure you discuss any questions you have with your health care provider. Document Released: 06/10/2015 Document Revised: 02/01/2016 Document Reviewed: 03/15/2015 Elsevier Interactive Patient Education  2017 [employer] Prevention in the Home Falls can cause injuries. They can happen to people of all ages. There are many things you can do to make your home safe and to help prevent falls. What can I do on the outside of my home? Regularly fix the edges of walkways and driveways and fix any cracks. Remove anything that might make you trip as you walk through a door, such as a raised step or threshold. Trim any bushes or trees on the path to your home. Use bright outdoor lighting. Clear any walking paths of anything that might make someone trip, such as rocks or tools. Regularly check to see if handrails are loose or broken. Make sure that both sides of any steps have handrails. Any raised decks and porches should have guardrails on the edges. Have any leaves, snow, or ice cleared regularly. Use sand or salt on walking paths during winter. Clean up any spills in your garage right away. This includes oil or grease spills. What can I do in the bathroom? Use night lights. Install grab bars by the toilet and in the tub and shower. Do not use towel bars as grab bars. Use non-skid mats or decals in the tub or shower. If you need to sit down in the shower, use a plastic, non-slip stool. Keep the floor dry. Clean up any water that spills on the floor as soon as it happens. Remove soap buildup in the tub or shower regularly. Attach bath mats securely with double-sided non-slip rug tape. Do not have throw rugs and other things on the floor that can make you trip. What can I do in the bedroom? Use night lights. Make sure  that you have a light by your bed that is easy to reach. Do not use any sheets or blankets that are too big for your bed. They should not hang down onto the floor. Have a firm chair that has side arms. You can use this for support while you get dressed. Do not have throw rugs and other things on the floor that can make you trip. What can I do in the kitchen? Clean up any spills right away. Avoid walking on wet floors. Keep items that you use a lot in easy-to-reach places. If you need to reach something above you, use a strong step stool that has a grab bar. Keep electrical cords out of the way. Do not use floor polish or wax that makes floors slippery. If you must use wax, use non-skid floor wax. Do not have throw rugs and other things on the floor that can make you trip. What can I do with my stairs? Do not leave any items on the stairs. Make sure that there are handrails on both sides of the stairs and use them. Fix handrails that are broken or loose. Make sure that handrails are as long as the stairways. Check any carpeting to make sure that it is firmly attached to the stairs. Fix any carpet that is loose or worn. Avoid having throw rugs at the top or bottom of the stairs. If you do have throw rugs, attach  them to the floor with carpet tape. Make sure that you have a light switch at the top of the stairs and the bottom of the stairs. If you do not have them, ask someone to add them for you. What else can I do to help prevent falls? Wear shoes that: Do not have high heels. Have rubber bottoms. Are comfortable and fit you well. Are closed at the toe. Do not wear sandals. If you use a stepladder: Make sure that it is fully opened. Do not climb a closed stepladder. Make sure that both sides of the stepladder are locked into place. Ask someone to hold it for you, if possible. Clearly mark and make sure that you can see: Any grab bars or handrails. First and last steps. Where the edge of  each step is. Use tools that help you move around (mobility aids) if they are needed. These include: Canes. Walkers. Scooters. Crutches. Turn on the lights when you go into a dark area. Replace any light bulbs as soon as they burn out. Set up your furniture so you have a clear path. Avoid moving your furniture around. If any of your floors are uneven, fix them. If there are any pets around you, be aware of where they are. Review your medicines with your doctor. Some medicines can make you feel dizzy. This can increase your chance of falling. Ask your doctor what other things that you can do to help prevent falls. This information is not intended to replace advice given to you by your health care provider. Make sure you discuss any questions you have with your health care provider. Document Released: 03/10/2009 Document Revised: 10/20/2015 Document Reviewed: 06/18/2014 Elsevier Interactive Patient Education  2017 [employer].

## 2021-08-07 NOTE — Progress Notes (Cosign Needed)
Virtual Visit via Telephone Note  I connected with  Jessica Little on 08/07/21 at  1:00 PM EDT by telephone and verified that I am speaking with the correct person using two identifiers.  Location: Patient: home Provider: [facility] Persons participating in the virtual visit: patient/Nurse Health Advisor   I discussed the limitations, risks, security and privacy concerns of performing an evaluation and management service by telephone and the availability of in person appointments. The patient expressed understanding and agreed to proceed.  Interactive audio and video telecommunications were attempted between this nurse and patient, however failed, due to patient having technical difficulties OR patient did not have access to video capability.  We continued and completed visit with audio only.  Some vital signs may be absent or patient reported.   Jessica Hope, LPN  Subjective:   Jessica Little is a 76 y.o. female who presents for Medicare Annual (Subsequent) preventive examination.  Review of Systems           Objective:    Today's Vitals   08/07/21 1256  Weight: 159 lb (72.1 kg)   Body mass index is 25.66 kg/m.  Advanced Directives 07/05/2021 02/01/2021 01/18/2021 06/21/2020 02/05/2019 12/10/2017 10/05/2016  Does Patient Have a Medical Advance Directive? No No No Yes Yes Yes Yes  Type of Advance Directive - - Web designer;Living will Healthcare Power of [place];Living will Healthcare Power of [place];Living will Living will  Copy of Healthcare Power of Attorney in Chart? - - - No - copy requested No - copy requested No - copy requested -  Would patient like information on creating a medical advance directive? - - No - Patient declined - - - -    Current Medications (verified) Outpatient Encounter Medications as of 08/07/2021  Medication Sig   atenolol (TENORMIN) 50 MG tablet Take 1 tablet by mouth once daily   Biotin 66063 MCG TABS Take by mouth.   calcium  carbonate (OS-CAL) 600 MG TABS tablet Take 600 mg by mouth daily with breakfast.   Cholecalciferol (VITAMIN D3 PO) Take by mouth daily.   diazepam (VALIUM) 5 MG tablet Take 1 tablet 30 mins prior to MRI. May take second dose if needed   Flaxseed, Linseed, (FLAXSEED OIL) 1000 MG CAPS Take by mouth.   lisinopril (ZESTRIL) 10 MG tablet Take 2 tablets (20 mg total) by mouth daily.   Magnesium 400 MG CAPS Take by mouth.   NON FORMULARY Probiotic daily.   nortriptyline (PAMELOR) 50 MG capsule Take 1 capsule (50 mg total) by mouth at bedtime.   omeprazole (PRILOSEC) 40 MG capsule Take 1 capsule (40 mg total) by mouth 2 (two) times daily.   perphenazine (TRILAFON) 4 MG tablet TAKE 1 TABLET BY MOUTH AT BEDTIME   potassium chloride (KLOR-CON) 10 MEQ tablet Take 1 tablet by mouth once daily   simvastatin (ZOCOR) 40 MG tablet Take 1 tablet (40 mg total) by mouth at bedtime.   vitamin E 1000 UNIT capsule Take 1,000 Units by mouth daily.   Zinc Sulfate (ZINC 15 PO) Take by mouth.   No facility-administered encounter medications on file as of 08/07/2021.    Allergies (verified) Patient has no known allergies.   History: Past Medical History:  Diagnosis Date   Anxiety    GERD (gastroesophageal reflux disease)    Hyperlipidemia    Hypertension    Past Surgical History:  Procedure Laterality Date   CATARACT EXTRACTION W/PHACO Left 01/18/2021   Procedure: CATARACT EXTRACTION PHACO  AND INTRAOCULAR LENS PLACEMENT (IOC) LEFT 7.40 01:11.7;  Surgeon: Lockie MolaBrasington, Chadwick, MD;  [facility];  Service: Ophthalmology;  Laterality: Left;   CATARACT EXTRACTION W/PHACO Right 02/01/2021   Procedure: CATARACT EXTRACTION PHACO AND INTRAOCULAR LENS PLACEMENT (IOC) RIGHT EYHANCE TORIC 8.60 01:09.9 ;  Surgeon: Lockie MolaBrasington, Chadwick, MD;  [facility];  Service: Ophthalmology;  Laterality: Right;   CHOLECYSTECTOMY     COLONOSCOPY  01/01/2006   COLONOSCOPY WITH PROPOFOL N/A 08/23/2015    Procedure: COLONOSCOPY WITH PROPOFOL;  Surgeon: Earline MayotteJeffrey W Byrnett, MD;  [facility];  Service: Endoscopy;  Laterality: N/A;   HIATAL HERNIA REPAIR  2019   Family History  Problem Relation Age of Onset   Arrhythmia Mother    Heart attack Father    Stroke Father    Healthy Sister    Healthy Brother    Healthy Sister    Breast cancer Neg Hx    Social History   Socioeconomic History   Marital status: Widowed    Spouse name: Not on file   Number of children: 0   Years of education: College   Highest education level: Bachelor's degree (e.g., BA, AB, BS)  Occupational History   Occupation: Retired  Tobacco Use   Smoking status: Never   Smokeless tobacco: Never  Vaping Use   Vaping Use: Never used  Substance and Sexual Activity   Alcohol use: No   Drug use: No   Sexual activity: Not on file  Other Topics Concern   Not on file  Social History Narrative   Right handed   No caffeine   One story home   Social Determinants of Health   Financial Resource Strain: Not on file  Food Insecurity: Not on file  Transportation Needs: Not on file  Physical Activity: Not on file  Stress: Not on file  Social Connections: Not on file    Tobacco Counseling Counseling given: Not Answered   Clinical Intake:  Pre-visit preparation completed: Yes  Pain : No/denies pain     Nutritional Risks: None Diabetes: No  How often do you need to have someone help you when you read instructions, pamphlets, or other written materials from your doctor or pharmacy?: 1 - Never  Diabetic?no  Interpreter Needed?: No  Information entered by :: Kennedy BuckerLorrie Yecenia Dalgleish, LPN   Activities of Daily Living In your present state of health, do you have any difficulty performing the following activities: 07/13/2021 02/01/2021  Hearing? N N  Vision? N N  Difficulty concentrating or making decisions? N N  Walking or climbing stairs? N N  Dressing or bathing? N N  Doing errands, shopping? N -   Some recent data might be hidden    Patient Care Team: Jacky KindlePayne, Elise T, FNP as PCP - General (Family Medicine) Cottle, Steva Readyarey G Jr., MD as Attending Physician (Psychiatry) Pa, [facility](Optometry)  Indicate any recent Medical Services you may have received from other than [facility] providers in the past year (date may be approximate).     Assessment:   This is a routine wellness examination for Deneice.  Hearing/Vision screen No results found.  Dietary issues and exercise activities discussed:     Goals Addressed   None    Depression Screen PHQ 2/9 Scores 07/07/2021 12/23/2020 06/21/2020 05/23/2020 02/25/2019 02/05/2019 12/10/2017  PHQ - 2 Score 1 0 0 0 0 0 0  PHQ- 9 Score 3 - - 2 0 - -    Fall Risk Fall Risk  07/05/2021  12/23/2020 06/21/2020 05/23/2020 12/22/2019  Falls in the past year? 1 0 0 0 0  Comment - - - - Emmi Telephone Survey: data to providers prior to load  Number falls in past yr: 0 0 0 0 -  Comment - - - - -  Injury with Fall? 0 0 0 0 -  Risk for fall due to : - - - No Fall Risks -  Follow up - - - Falls evaluation completed -    FALL RISK PREVENTION PERTAINING TO THE HOME:  Any stairs in or around the home? Yes  If so, are there any without handrails? No  Home free of loose throw rugs in walkways, pet beds, electrical cords, etc? Yes  Adequate lighting in your home to reduce risk of falls? Yes   ASSISTIVE DEVICES UTILIZED TO PREVENT FALLS:  Life alert? No  Use of a cane, walker or w/c? No  Grab bars in the bathroom? Yes  Shower chair or bench in shower? No  Elevated toilet seat or a handicapped toilet? No   Cognitive Function    6CIT Screen 10/05/2016  What Year? 0 points  What month? 0 points  What time? 0 points  Count back from 20 0 points  Months in reverse 0 points  Repeat phrase 2 points  Total Score 2    Immunizations Immunization History  Administered Date(s) Administered   Hepatitis A 08/13/2001   Moderna Sars-Covid-2 Vaccination  10/14/2019, 11/11/2019   Pneumococcal Conjugate-13 07/06/2015   Pneumococcal Polysaccharide-23 10/05/2016   Td 10/24/2000    TDAP status: Due, Education has been provided regarding the importance of this vaccine. Advised may receive this vaccine at local pharmacy or Health Dept. Aware to provide a copy of the vaccination record if obtained from local pharmacy or Health Dept. Verbalized acceptance and understanding.  Flu Vaccine status: Declined, Education has been provided regarding the importance of this vaccine but patient still declined. Advised may receive this vaccine at local pharmacy or Health Dept. Aware to provide a copy of the vaccination record if obtained from local pharmacy or Health Dept. Verbalized acceptance and understanding.  Pneumococcal vaccine status: Up to date  Covid-19 vaccine status: Completed vaccines  Qualifies for Shingles Vaccine? Yes   Zostavax completed No   Shingrix Completed?: No.    Education has been provided regarding the importance of this vaccine. Patient has been advised to call insurance company to determine out of pocket expense if they have not yet received this vaccine. Advised may also receive vaccine at local pharmacy or Health Dept. Verbalized acceptance and understanding.  Screening Tests Health Maintenance  Topic Date Due   Zoster Vaccines- Shingrix (1 of 2) Never done   COVID-19 Vaccine (3 - Booster for Moderna series) 01/06/2020   TETANUS/TDAP  05/28/2026 (Originally 10/25/2010)   DEXA SCAN  01/02/2022   MAMMOGRAM  06/02/2022   COLONOSCOPY (Pts 45-42yrs Insurance coverage will need to be confirmed)  08/22/2025   Pneumonia Vaccine 48+ Years old  Completed   Hepatitis C Screening  Completed   HPV VACCINES  Aged Out    Health Maintenance  Health Maintenance Due  Topic Date Due   Zoster Vaccines- Shingrix (1 of 2) Never done   COVID-19 Vaccine (3 - Booster for Moderna series) 01/06/2020    Colorectal cancer screening: Type of  screening: Colonoscopy. Completed 08/23/15. Repeat every 10 years  Mammogram status: Completed 06/02/20. Repeat every year- states has referral  Bone Density status: Completed 01/02/17. Results reflect: Bone density  results: NORMAL. Repeat every 5 years.- referral declined  Lung Cancer Screening: (Low Dose CT Chest recommended if Age 16-80 years, 30 pack-year currently smoking OR have quit w/in 15years.) does not qualify.   Additional Screening:  Hepatitis C Screening: does qualify; Completed 01/30/18  Vision Screening: Recommended annual ophthalmology exams for early detection of glaucoma and other disorders of the eye. Is the patient up to date with their annual eye exam?  Yes  Who is the provider or what is the name of the office in which the patient attends annual eye exams? [facility] If pt is not established with a provider, would they like to be referred to a provider to establish care? No .   Dental Screening: Recommended annual dental exams for proper oral hygiene  Community Resource Referral / Chronic Care Management: CRR required this visit?  No   CCM required this visit?  No      Plan:     I have personally reviewed and noted the following in the patients chart:   Medical and social history Use of alcohol, tobacco or illicit drugs  Current medications and supplements including opioid prescriptions.  Functional ability and status Nutritional status Physical activity Advanced directives List of other physicians Hospitalizations, surgeries, and ER visits in previous 12 months Vitals Screenings to include cognitive, depression, and falls Referrals and appointments  In addition, I have reviewed and discussed with patient certain preventive protocols, quality metrics, and best practice recommendations. A written personalized care plan for preventive services as well as general preventive health recommendations were provided to patient.     Jessica Hope,  LPN   1/61/0960   Nurse Notes: none

## 2021-08-23 ENCOUNTER — Other Ambulatory Visit: Payer: Self-pay | Admitting: Psychiatry

## 2021-08-23 ENCOUNTER — Telehealth: Payer: Self-pay | Admitting: Psychiatry

## 2021-08-23 DIAGNOSIS — F4001 Agoraphobia with panic disorder: Secondary | ICD-10-CM

## 2021-08-23 DIAGNOSIS — F3342 Major depressive disorder, recurrent, in full remission: Secondary | ICD-10-CM

## 2021-08-23 DIAGNOSIS — F251 Schizoaffective disorder, depressive type: Secondary | ICD-10-CM

## 2021-08-23 DIAGNOSIS — F411 Generalized anxiety disorder: Secondary | ICD-10-CM

## 2021-08-23 MED ORDER — PERPHENAZINE 4 MG PO TABS: ORAL_TABLET | ORAL | 0 refills | Status: DC

## 2021-08-23 MED ORDER — NORTRIPTYLINE HCL 50 MG PO CAPS: 50.0000 mg | ORAL_CAPSULE | Freq: Every day | ORAL | 0 refills | Status: DC

## 2021-08-23 NOTE — Telephone Encounter (Signed)
sent 

## 2021-08-23 NOTE — Telephone Encounter (Signed)
Patient called in for refill for Nortriptyline 50mg  and Perphenazine 4mg . States that [facility] makes prescription yearly. Appt 5/22. Ph: [phone]. Shew would the Notriptyline to go to [place]. And the Perphenazine to go [facility] [place] ?

## 2021-08-25 ENCOUNTER — Other Ambulatory Visit: Payer: Self-pay | Admitting: Psychiatry

## 2021-08-25 DIAGNOSIS — F251 Schizoaffective disorder, depressive type: Secondary | ICD-10-CM

## 2021-08-25 DIAGNOSIS — F3342 Major depressive disorder, recurrent, in full remission: Secondary | ICD-10-CM

## 2021-08-25 DIAGNOSIS — F411 Generalized anxiety disorder: Secondary | ICD-10-CM

## 2021-08-25 DIAGNOSIS — F4001 Agoraphobia with panic disorder: Secondary | ICD-10-CM

## 2021-08-30 ENCOUNTER — Ambulatory Visit (INDEPENDENT_AMBULATORY_CARE_PROVIDER_SITE_OTHER): Payer: Medicare Other | Admitting: Neurology

## 2021-08-30 DIAGNOSIS — R55 Syncope and collapse: Secondary | ICD-10-CM | POA: Diagnosis not present

## 2021-09-07 ENCOUNTER — Other Ambulatory Visit: Payer: Self-pay | Admitting: Family Medicine

## 2021-09-07 DIAGNOSIS — I10 Essential (primary) hypertension: Secondary | ICD-10-CM

## 2021-09-07 NOTE — Telephone Encounter (Signed)
Requested Prescriptions  ?Pending Prescriptions Disp Refills  ?? atenolol (TENORMIN) 50 MG tablet [Pharmacy Med Name: Atenolol 50 MG Oral Tablet] 90 tablet 0  ?  Sig: Take 1 tablet by mouth once daily  ?  ? Cardiovascular: Beta Blockers 2 Failed - 09/07/2021 10:34 AM  ?  ?  Failed - Cr in normal range and within 360 days  ?  Creatinine  ?Date Value Ref Range Status  ?04/30/2014 1.75 (H) 0.60 - 1.30 mg/dL Final  ? ?Creatinine, Ser  ?Date Value Ref Range Status  ?08/03/2021 1.83 (H) 0.57 - 1.00 mg/dL Final  ?   ?  ?  Passed - Last BP in normal range  ?  BP Readings from Last 1 Encounters:  ?08/03/21 113/76  ?   ?  ?  Passed - Last Heart Rate in normal range  ?  Pulse Readings from Last 1 Encounters:  ?08/03/21 (!) 106  ?   ?  ?  Passed - Valid encounter within last 6 months  ?  Recent Outpatient Visits   ?      ? 1 month ago Primary hypertension  ? [facility] Merita Norton T, FNP  ? 1 month ago Primary hypertension  ? [facility], Marzella Schlein, MD  ? 2 months ago Primary hypertension  ? [facility] Jacky Kindle, FNP  ? 5 months ago Syncope and collapse  ? [facility] Jacky Kindle, FNP  ? 8 months ago Primary hypertension  ? [facility] Merita Norton T, FNP  ?  ?  ?Future Appointments   ?        ? In 4 days Jacky Kindle, FNP [facility]  ? In 1 month Stoioff, Verna Czech, MD [facility]  ?  ? ?  ?  ?  ? ?

## 2021-09-08 DIAGNOSIS — H43813 Vitreous degeneration, bilateral: Secondary | ICD-10-CM | POA: Diagnosis not present

## 2021-09-11 ENCOUNTER — Ambulatory Visit (INDEPENDENT_AMBULATORY_CARE_PROVIDER_SITE_OTHER): Payer: Medicare Other | Admitting: Family Medicine

## 2021-09-11 ENCOUNTER — Encounter: Payer: Self-pay | Admitting: Family Medicine

## 2021-09-11 VITALS — BP 149/108 | HR 78 | Temp 98.3°F | Resp 16 | Ht 66.0 in | Wt 156.9 lb

## 2021-09-11 DIAGNOSIS — F251 Schizoaffective disorder, depressive type: Secondary | ICD-10-CM

## 2021-09-11 DIAGNOSIS — Z0000 Encounter for general adult medical examination without abnormal findings: Secondary | ICD-10-CM

## 2021-09-11 DIAGNOSIS — R7989 Other specified abnormal findings of blood chemistry: Secondary | ICD-10-CM | POA: Diagnosis not present

## 2021-09-11 DIAGNOSIS — I10 Essential (primary) hypertension: Secondary | ICD-10-CM | POA: Diagnosis not present

## 2021-09-11 DIAGNOSIS — Z1231 Encounter for screening mammogram for malignant neoplasm of breast: Secondary | ICD-10-CM

## 2021-09-11 DIAGNOSIS — R569 Unspecified convulsions: Secondary | ICD-10-CM

## 2021-09-11 MED ORDER — LISINOPRIL 20 MG PO TABS: 40.0000 mg | ORAL_TABLET | Freq: Every day | ORAL | 1 refills | Status: DC

## 2021-09-11 NOTE — Assessment & Plan Note (Signed)
UTD on vision ?Needs dental appt- has dental home ?Does not wish to complete shingles vaccine ?Things to do to keep yourself healthy  ?- Exercise at least 30-45 minutes a day, 3-4 days a week.  ?- Eat a low-fat diet with lots of fruits and vegetables, up to 7-9 servings per day.  ?- Seatbelts can save your life. Wear them always.  ?- Smoke detectors on every level of your home, check batteries every year.  ?- Eye Doctor - have an eye exam every 1-2 years  ?- Safe sex - if you may be exposed to STDs, use a condom.  ?- Alcohol -  If you drink, do it moderately, less than 2 drinks per day.  ?- [place]. Choose someone to speak for you if you are not able.  ?- Depression is common in our stressful world.If you're feeling down or losing interest in things you normally enjoy, please come in for a visit.  ?- Violence - If anyone is threatening or hurting you, please call immediately. ? ? ?

## 2021-09-11 NOTE — Assessment & Plan Note (Signed)
Hx of dehydration s/p GI illness ?Repeat CMP ?

## 2021-09-11 NOTE — Assessment & Plan Note (Signed)
Chronic, elevated ?Review of home log shows 6 SBP readings 140-150s and 6 SBP readings 160-170s, 2 readings <140 / DBP readings primarily 90-110's. HR 59-86 bpm ?Denies CP ?Denies SOB/ DOE ?Denies low blood pressure/hypotension ?Denies vision changes ?No LE Edema noted on exam ?Continue medication at increased dose, lisinopril 40 mg- currently taking 2-10mg   ?Denies side effects ?RTC 4-6 wks for f/u ?Repeat CMP ?Seek emergent care if you develop chest pain or chest pressure ? ?

## 2021-09-11 NOTE — Assessment & Plan Note (Signed)
Chronic, stable ?F/b psych ?Active in church; good group of friends assist with driving s/p seizure like activity 03/2021 ?

## 2021-09-11 NOTE — Progress Notes (Signed)
? ?[employer] as a Education administrator for Gwyneth Sprout, FNP.,have documented all relevant documentation on the behalf of Gwyneth Sprout, FNP,as directed by  Gwyneth Sprout, FNP while in the presence of Gwyneth Sprout, FNP.  ? ?Complete physical exam ? ? ?Patient: Jessica Little   DOB: Jun 05, 1945   76 y.o. Female  MRN: [id number] ?Visit Date: 09/11/2021 ? ?Today's healthcare provider: Gwyneth Sprout, FNP ? ?Re Introduced to nurse practitioner role and practice setting.  All questions answered.  Discussed provider/patient relationship and expectations. ? ? ?Chief Complaint  ?Patient presents with  ? Annual Exam  ? ?Subjective  ?  ? ?HPI  ?Jessica Little is a 76 y.o. female who presents today for a complete physical exam.  ?She reports consuming a general diet. The patient does not participate in regular exercise at present. She generally feels fairly well. She reports sleeping fairly well. She does have additional problems to discuss today.  ? ?NHA- 08/07/2021 ? ?Past Medical History:  ?Diagnosis Date  ? Anxiety   ? GERD (gastroesophageal reflux disease)   ? Hyperlipidemia   ? Hypertension   ? ?Past Surgical History:  ?Procedure Laterality Date  ? CATARACT EXTRACTION W/PHACO Left 01/18/2021  ? Procedure: CATARACT EXTRACTION PHACO AND INTRAOCULAR LENS PLACEMENT (IOC) LEFT 7.40 01:11.7;  Surgeon: Leandrew Koyanagi, MD;  Location: [place];  Service: Ophthalmology;  Laterality: Left;  ? CATARACT EXTRACTION W/PHACO Right 02/01/2021  ? Procedure: CATARACT EXTRACTION PHACO AND INTRAOCULAR LENS PLACEMENT ([place] RIGHT EYHANCE TORIC 8.60 01:09.9 ;  Surgeon: Leandrew Koyanagi, MD;  Location: [place];  Service: Ophthalmology;  Laterality: Right;  ? CHOLECYSTECTOMY    ? COLONOSCOPY  01/01/2006  ? COLONOSCOPY WITH PROPOFOL N/A 08/23/2015  ? Procedure: COLONOSCOPY WITH PROPOFOL;  Surgeon: Robert Bellow, MD;  [facility]: [facility];  Service: Endoscopy;  Laterality: N/A;  ? HIATAL HERNIA REPAIR  2019   ? ?Social History  ? ?Socioeconomic History  ? Marital status: Widowed  ?  Spouse name: Not on file  ? Number of children: 0  ? Years of education: College  ? Highest education level: Bachelor's degree (e.g., BA, AB, BS)  ?Occupational History  ? Occupation: Retired  ?Tobacco Use  ? Smoking status: Never  ? Smokeless tobacco: Never  ?Vaping Use  ? Vaping Use: Never used  ?Substance and Sexual Activity  ? Alcohol use: No  ? Drug use: No  ? Sexual activity: Not on file  ?Other Topics Concern  ? Not on file  ?Social History Narrative  ? Right handed  ? No caffeine  ? One story home  ? ?Social Determinants of Health  ? ?Financial Resource Strain: Low Risk   ? Difficulty of Paying Living Expenses: Not hard at all  ?Food Insecurity: No Food Insecurity  ? Worried About Charity fundraiser in the Last Year: Never true  ? Ran Out of Food in the Last Year: Never true  ?Transportation Needs: No Transportation Needs  ? Lack of Transportation (Medical): No  ? Lack of Transportation (Non-Medical): No  ?Physical Activity: Insufficiently Active  ? Days of Exercise per Week: 2 days  ? Minutes of Exercise per Session: 40 min  ?Stress: No Stress Concern Present  ? Feeling of Stress : Not at all  ?Social Connections: Moderately Isolated  ? Frequency of Communication with Friends and Family: Twice a week  ? Frequency of Social Gatherings with Friends and Family: Three times a week  ? Attends Religious  Services: More than 4 times per year  ? Active Member of Clubs or Organizations: No  ? Attends Archivist Meetings: Never  ? Marital Status: Widowed  ?Intimate Partner Violence: Not At Risk  ? Fear of Current or Ex-Partner: No  ? Emotionally Abused: No  ? Physically Abused: No  ? Sexually Abused: No  ? ?Family Status  ?Relation Name Status  ? Mother  Deceased at age 71  ? Father  Deceased at age 35  ?     Heart Attack  ? Sister  Alive  ? Brother  Alive  ? Sister  Alive  ? Neg Hx  (Not Specified)  ? ?Family History  ?Problem  Relation Age of Onset  ? Arrhythmia Mother   ? Heart attack Father   ? Stroke Father   ? Healthy Sister   ? Healthy Brother   ? Healthy Sister   ? Breast cancer Neg Hx   ? ?No Known Allergies  ?Patient Care Team: ?Gwyneth Sprout, FNP as PCP - General (Family Medicine) ?Cottle, Billey Co., MD as Attending Physician (Psychiatry) ?Pa, [facility])  ? ?Medications: ?Outpatient Medications Prior to Visit  ?Medication Sig  ? aspirin EC 81 MG tablet Take 81 mg by mouth daily. Swallow whole.  ? atenolol (TENORMIN) 50 MG tablet Take 1 tablet by mouth once daily  ? Biotin 10000 MCG TABS Take by mouth.  ? calcium carbonate (OS-CAL) 600 MG TABS tablet Take 600 mg by mouth daily with breakfast.  ? Cholecalciferol (VITAMIN D3 PO) Take by mouth daily.  ? Flaxseed, Linseed, (FLAXSEED OIL) 1000 MG CAPS Take by mouth.  ? Magnesium 400 MG CAPS Take by mouth.  ? NON FORMULARY Probiotic daily.  ? nortriptyline (PAMELOR) 50 MG capsule Take 1 capsule (50 mg total) by mouth at bedtime.  ? omeprazole (PRILOSEC) 40 MG capsule Take 1 capsule (40 mg total) by mouth 2 (two) times daily.  ? perphenazine (TRILAFON) 4 MG tablet TAKE 1 TABLET BY MOUTH AT BEDTIME  ? potassium chloride (KLOR-CON) 10 MEQ tablet Take 1 tablet by mouth once daily  ? simvastatin (ZOCOR) 40 MG tablet Take 1 tablet (40 mg total) by mouth at bedtime.  ? vitamin E 1000 UNIT capsule Take 1,000 Units by mouth daily.  ? Zinc Sulfate (ZINC 15 PO) Take by mouth.  ? [DISCONTINUED] lisinopril (ZESTRIL) 10 MG tablet Take 2 tablets (20 mg total) by mouth daily.  ? diazepam (VALIUM) 5 MG tablet Take 1 tablet 30 mins prior to MRI. May take second dose if needed (Patient not taking: Reported on 08/07/2021)  ? ?No facility-administered medications prior to visit.  ? ? ?Review of Systems  ?Gastrointestinal:  Positive for diarrhea.  ?Allergic/Immunologic: Positive for environmental allergies.  ?All other systems reviewed and are negative. ? ?Last CBC ?Lab Results   ?Component Value Date  ? WBC 7.2 08/03/2021  ? HGB 14.7 08/03/2021  ? HCT 45.7 08/03/2021  ? MCV 79 08/03/2021  ? MCH 25.3 (L) 08/03/2021  ? RDW 15.7 (H) 08/03/2021  ? PLT 281 08/03/2021  ? ?Last metabolic panel ?Lab Results  ?Component Value Date  ? GLUCOSE 102 (H) 08/03/2021  ? NA 139 08/03/2021  ? K 4.0 08/03/2021  ? CL 101 08/03/2021  ? CO2 19 (L) 08/03/2021  ? BUN 51 (H) 08/03/2021  ? CREATININE 1.83 (H) 08/03/2021  ? EGFR 29 (L) 08/03/2021  ? CALCIUM 9.5 08/03/2021  ? PROT 7.0 08/03/2021  ? ALBUMIN 4.5 08/03/2021  ?  LABGLOB 2.5 08/03/2021  ? AGRATIO 1.8 08/03/2021  ? BILITOT 0.5 08/03/2021  ? ALKPHOS 65 08/03/2021  ? AST 25 08/03/2021  ? ALT 18 08/03/2021  ? ANIONGAP 11 03/19/2021  ? ?Last lipids ?Lab Results  ?Component Value Date  ? CHOL 176 08/03/2021  ? HDL 53 08/03/2021  ? [place] 95 08/03/2021  ? TRIG 164 (H) 08/03/2021  ? CHOLHDL 3.3 08/03/2021  ? ?Last hemoglobin A1c ?Lab Results  ?Component Value Date  ? HGBA1C 5.7 (H) 08/03/2021  ? ?Last thyroid functions ?Lab Results  ?Component Value Date  ? TSH 2.300 05/23/2020  ? ?Last vitamin D ?No results found for: 25OHVITD2, [place], VD25OH ?Last vitamin B12 and Folate ?No results found for: VITAMINB12, FOLATE ?  ? Objective  ? ?  ?BP (!) 149/108   Pulse 78   Temp 98.3 ?F (36.8 ?C) (Oral)   Resp 16   Ht _0  (1.676 m)   Wt 156 lb 14.4 oz (71.2 kg)   SpO2 98%   BMI 25.32 kg/m?  ? ?BP Readings from Last 3 Encounters:  ?09/11/21 (!) 149/108  ?08/03/21 113/76  ?07/13/21 (!) 176/96  ? ?Wt Readings from Last 3 Encounters:  ?09/11/21 156 lb 14.4 oz (71.2 kg)  ?08/07/21 159 lb (72.1 kg)  ?08/03/21 159 lb (72.1 kg)  ? ?SpO2 Readings from Last 3 Encounters:  ?09/11/21 98%  ?08/03/21 100%  ?07/13/21 99%  ? ?Physical Exam ?Vitals and nursing note reviewed.  ?Constitutional:   ?   General: She is awake. She is not in acute distress. ?   Appearance: Normal appearance. She is well-developed, well-groomed and normal weight. She is not ill-appearing,  toxic-appearing or diaphoretic.  ?HENT:  ?   Head: Normocephalic and atraumatic.  ?   Jaw: There is normal jaw occlusion. No trismus, tenderness, swelling or pain on movement.  ?   Right Ear: Hearing, tympanic membrane, ear canal and

## 2021-09-11 NOTE — Assessment & Plan Note (Signed)
Due for screening for mammogram, denies breast concerns, provided with phone number to call and schedule appointment for mammogram. Encouraged to repeat breast cancer screening every 1-2 years.  

## 2021-09-11 NOTE — Assessment & Plan Note (Signed)
Chronic, stable ?Has had 2 EEG's completed- awaiting the results ?No return seizure like behavior ?F/b neuro ? ?

## 2021-09-12 LAB — COMPREHENSIVE METABOLIC PANEL
ALT: 10 IU/L (ref 0–32)
AST: 21 IU/L (ref 0–40)
Albumin/Globulin Ratio: 1.8 (ref 1.2–2.2)
Albumin: 4.4 g/dL (ref 3.7–4.7)
Alkaline Phosphatase: 79 IU/L (ref 44–121)
BUN/Creatinine Ratio: 17 (ref 12–28)
BUN: 27 mg/dL (ref 8–27)
Bilirubin Total: 0.5 mg/dL (ref 0.0–1.2)
CO2: 23 mmol/L (ref 20–29)
Calcium: 9.7 mg/dL (ref 8.7–10.3)
Chloride: 105 mmol/L (ref 96–106)
Creatinine, Ser: 1.55 mg/dL — ABNORMAL HIGH (ref 0.57–1.00)
Globulin, Total: 2.5 g/dL (ref 1.5–4.5)
Glucose: 152 mg/dL — ABNORMAL HIGH (ref 70–99)
Potassium: 3.9 mmol/L (ref 3.5–5.2)
Sodium: 143 mmol/L (ref 134–144)
Total Protein: 6.9 g/dL (ref 6.0–8.5)
eGFR: 35 mL/min/{1.73_m2} — ABNORMAL LOW (ref >59)

## 2021-09-13 ENCOUNTER — Other Ambulatory Visit: Payer: Self-pay

## 2021-09-13 DIAGNOSIS — N289 Disorder of kidney and ureter, unspecified: Secondary | ICD-10-CM

## 2021-09-13 DIAGNOSIS — I10 Essential (primary) hypertension: Secondary | ICD-10-CM

## 2021-09-20 NOTE — Procedures (Signed)
ELECTROENCEPHALOGRAM REPORT ? ?Dates of Recording: 08/30/2021 11:23AM to 08/31/2021 11:30AM ? ?Patient's Name: Jessica Little ?MRN: [id number] ?Date of Birth: 02/25/1946 ? ?Referring Provider: Dr. Ellouise Newer ? ?Procedure: 24-hour ambulatory video EEG ? ?History: This is a 76 year old woman with an episode of loss of consciousness with shaking. EEG for classification. ? ?Medications:  ?TENORMIN 50 MG tablet ?Biotin 10000 MCG TABS ?OS-CAL 600 MG TABS tablet ?VITAMIN D3 PO ?FLAXSEED OIL ?ZESTRIL 10 MG tablet ?Magnesium 400 MG CAPS ?PAMELOR 50 MG capsuleProbiotic daily ?PRILOSEC 40 MG capsule ?TRILAFON 4 MG tablet ?KLOR-CON 10 MEQ tablet ?ZOCOR 40 MG tablet ?vitamin E 1000 UNIT capsule ?ZINC 15 PO ? ?Technical Summary: ?This is a 24-hour multichannel digital video EEG recording measured by the international 10-20 system with electrodes applied with paste and impedances below 5000 ohms performed as portable with EKG monitoring.  The digital EEG was referentially recorded, reformatted, and digitally filtered in a variety of bipolar and referential montages for optimal display.   ? ?DESCRIPTION OF RECORDING: ?During maximal wakefulness, the background activity consisted of a symmetric 10 Hz posterior dominant rhythm which was reactive to eye opening.  There were no epileptiform discharges or focal slowing seen in wakefulness. ? ?During the recording, the patient progresses through wakefulness, drowsiness, and Stage 2 sleep. During drowsiness and sleep, there is an increase in theta slowing of the background, at times sharply contoured over the bilateral temporal regions without clear epileptogenic potential. There were no clear epileptiform discharges seen. ? ?Events: ? ?There were no electrographic seizures seen.  EKG lead was unremarkable. ? ?IMPRESSION: This 24-hour ambulatory video EEG study is within normal limits. ? ?CLINICAL CORRELATION: A normal EEG does not exclude a clinical diagnosis of epilepsy.Typical events were  not captured.  If further clinical questions remain, inpatient video EEG monitoring may be helpful. ? ? ?Ellouise Newer, M.D. ? ?

## 2021-09-22 ENCOUNTER — Inpatient Hospital Stay: Payer: Medicare Other

## 2021-09-22 ENCOUNTER — Other Ambulatory Visit: Payer: Self-pay

## 2021-09-22 ENCOUNTER — Inpatient Hospital Stay
Admission: EM | Admit: 2021-09-22 | Discharge: 2021-09-24 | DRG: 312 | Disposition: A | Discharge status: Home / Self Care | Payer: Medicare Other | Attending: Family Medicine | Admitting: Family Medicine

## 2021-09-22 DIAGNOSIS — I129 Hypertensive chronic kidney disease with stage 1 through stage 4 chronic kidney disease, or unspecified chronic kidney disease: Secondary | ICD-10-CM | POA: Diagnosis present

## 2021-09-22 DIAGNOSIS — K219 Gastro-esophageal reflux disease without esophagitis: Secondary | ICD-10-CM | POA: Diagnosis present

## 2021-09-22 DIAGNOSIS — I471 Supraventricular tachycardia: Secondary | ICD-10-CM | POA: Diagnosis present

## 2021-09-22 DIAGNOSIS — K449 Diaphragmatic hernia without obstruction or gangrene: Secondary | ICD-10-CM | POA: Diagnosis not present

## 2021-09-22 DIAGNOSIS — E162 Hypoglycemia, unspecified: Secondary | ICD-10-CM | POA: Diagnosis present

## 2021-09-22 DIAGNOSIS — Z7982 Long term (current) use of aspirin: Secondary | ICD-10-CM

## 2021-09-22 DIAGNOSIS — E782 Mixed hyperlipidemia: Secondary | ICD-10-CM | POA: Diagnosis not present

## 2021-09-22 DIAGNOSIS — F259 Schizoaffective disorder, unspecified: Secondary | ICD-10-CM | POA: Diagnosis present

## 2021-09-22 DIAGNOSIS — E11649 Type 2 diabetes mellitus with hypoglycemia without coma: Secondary | ICD-10-CM | POA: Diagnosis not present

## 2021-09-22 DIAGNOSIS — I10 Essential (primary) hypertension: Secondary | ICD-10-CM | POA: Diagnosis not present

## 2021-09-22 DIAGNOSIS — E785 Hyperlipidemia, unspecified: Secondary | ICD-10-CM | POA: Diagnosis present

## 2021-09-22 DIAGNOSIS — Z79899 Other long term (current) drug therapy: Secondary | ICD-10-CM | POA: Diagnosis not present

## 2021-09-22 DIAGNOSIS — I6523 Occlusion and stenosis of bilateral carotid arteries: Secondary | ICD-10-CM | POA: Diagnosis not present

## 2021-09-22 DIAGNOSIS — Z8249 Family history of ischemic heart disease and other diseases of the circulatory system: Secondary | ICD-10-CM

## 2021-09-22 DIAGNOSIS — N1832 Chronic kidney disease, stage 3b: Secondary | ICD-10-CM | POA: Diagnosis present

## 2021-09-22 DIAGNOSIS — F329 Major depressive disorder, single episode, unspecified: Secondary | ICD-10-CM | POA: Diagnosis not present

## 2021-09-22 DIAGNOSIS — R55 Syncope and collapse: Secondary | ICD-10-CM | POA: Diagnosis not present

## 2021-09-22 DIAGNOSIS — F411 Generalized anxiety disorder: Secondary | ICD-10-CM | POA: Diagnosis present

## 2021-09-22 DIAGNOSIS — R9431 Abnormal electrocardiogram [ECG] [EKG]: Secondary | ICD-10-CM

## 2021-09-22 LAB — URINALYSIS, ROUTINE W REFLEX MICROSCOPIC
Bilirubin Urine: NEGATIVE
Bilirubin Urine: NEGATIVE
Glucose, UA: NEGATIVE mg/dL
Glucose, UA: NEGATIVE mg/dL
Hgb urine dipstick: NEGATIVE
Hgb urine dipstick: NEGATIVE
Ketones, ur: NEGATIVE mg/dL
Ketones, ur: NEGATIVE mg/dL
Leukocytes,Ua: NEGATIVE
Leukocytes,Ua: NEGATIVE
Nitrite: NEGATIVE
Nitrite: NEGATIVE
Protein, ur: NEGATIVE mg/dL
Protein, ur: NEGATIVE mg/dL
Specific Gravity, Urine: 1.013 (ref 1.005–1.030)
Specific Gravity, Urine: 1.014 (ref 1.005–1.030)
pH: 5 (ref 5.0–8.0)
pH: 5 (ref 5.0–8.0)

## 2021-09-22 LAB — CBC
HCT: 42.4 % (ref 36.0–46.0)
HCT: 43 % (ref 36.0–46.0)
Hemoglobin: 13.1 g/dL (ref 12.0–15.0)
Hemoglobin: 13.4 g/dL (ref 12.0–15.0)
MCH: 25.6 pg — ABNORMAL LOW (ref 26.0–34.0)
MCH: 25.9 pg — ABNORMAL LOW (ref 26.0–34.0)
MCHC: 30.9 g/dL (ref 30.0–36.0)
MCHC: 31.2 g/dL (ref 30.0–36.0)
MCV: 82.8 fL (ref 80.0–100.0)
MCV: 83 fL (ref 80.0–100.0)
Platelets: 270 10*3/uL (ref 150–400)
Platelets: 223 10*3/uL (ref 150–400)
RBC: 5.12 MIL/uL — ABNORMAL HIGH (ref 3.87–5.11)
RBC: 5.18 MIL/uL — ABNORMAL HIGH (ref 3.87–5.11)
RDW: 15.9 % — ABNORMAL HIGH (ref 11.5–15.5)
RDW: 16.1 % — ABNORMAL HIGH (ref 11.5–15.5)
WBC: 7.3 10*3/uL (ref 4.0–10.5)
WBC: 9.7 10*3/uL (ref 4.0–10.5)
nRBC: 0 % (ref 0.0–0.2)
nRBC: 0 % (ref 0.0–0.2)

## 2021-09-22 LAB — BASIC METABOLIC PANEL
Anion gap: 10 (ref 5–15)
BUN: 31 mg/dL — ABNORMAL HIGH (ref 8–23)
CO2: 25 mmol/L (ref 22–32)
Calcium: 9.3 mg/dL (ref 8.9–10.3)
Chloride: 104 mmol/L (ref 98–111)
Creatinine, Ser: 1.59 mg/dL — ABNORMAL HIGH (ref 0.44–1.00)
GFR, Estimated: 34 mL/min — ABNORMAL LOW (ref >60)
Glucose, Bld: 46 mg/dL — ABNORMAL LOW (ref 70–99)
Potassium: 3.7 mmol/L (ref 3.5–5.1)
Sodium: 139 mmol/L (ref 135–145)

## 2021-09-22 LAB — URINE DRUG SCREEN, QUALITATIVE (ARMC ONLY)
Amphetamines, Ur Screen: NOT DETECTED
Barbiturates, Ur Screen: NOT DETECTED
Benzodiazepine, Ur Scrn: NOT DETECTED
Cannabinoid 50 Ng, Ur ~~LOC~~: POSITIVE — AB
Cocaine Metabolite,Ur ~~LOC~~: NOT DETECTED
MDMA (Ecstasy)Ur Screen: NOT DETECTED
Methadone Scn, Ur: NOT DETECTED
Opiate, Ur Screen: NOT DETECTED
Phencyclidine (PCP) Ur S: NOT DETECTED
Tricyclic, Ur Screen: POSITIVE — AB

## 2021-09-22 LAB — TROPONIN I (HIGH SENSITIVITY): Troponin I (High Sensitivity): 7 ng/L (ref <18)

## 2021-09-22 LAB — CBG MONITORING, ED
Glucose-Capillary: 67 mg/dL — ABNORMAL LOW (ref 70–99)
Glucose-Capillary: 83 mg/dL (ref 70–99)
Glucose-Capillary: 76 mg/dL (ref 70–99)

## 2021-09-22 LAB — MAGNESIUM: Magnesium: 2.4 mg/dL (ref 1.7–2.4)

## 2021-09-22 LAB — CREATININE, SERUM
Creatinine, Ser: 1.53 mg/dL — ABNORMAL HIGH (ref 0.44–1.00)
GFR, Estimated: 36 mL/min — ABNORMAL LOW (ref >60)

## 2021-09-22 LAB — HEMOGLOBIN A1C
Hgb A1c MFr Bld: 5.6 % (ref 4.8–5.6)
Mean Plasma Glucose: 114.02 mg/dL

## 2021-09-22 LAB — TSH: TSH: 1.126 u[IU]/mL (ref 0.350–4.500)

## 2021-09-22 IMAGING — CR DG CHEST 2V
2 series · 2 of 2 positions shown · non-contrast
Comparison: Chest two views [DATE] and [DATE]; CT chest
[DATE]; CT abdomen and pelvis [DATE]

CLINICAL DATA: Syncope.

EXAM:
CHEST - 2 VIEW

[chest pa]
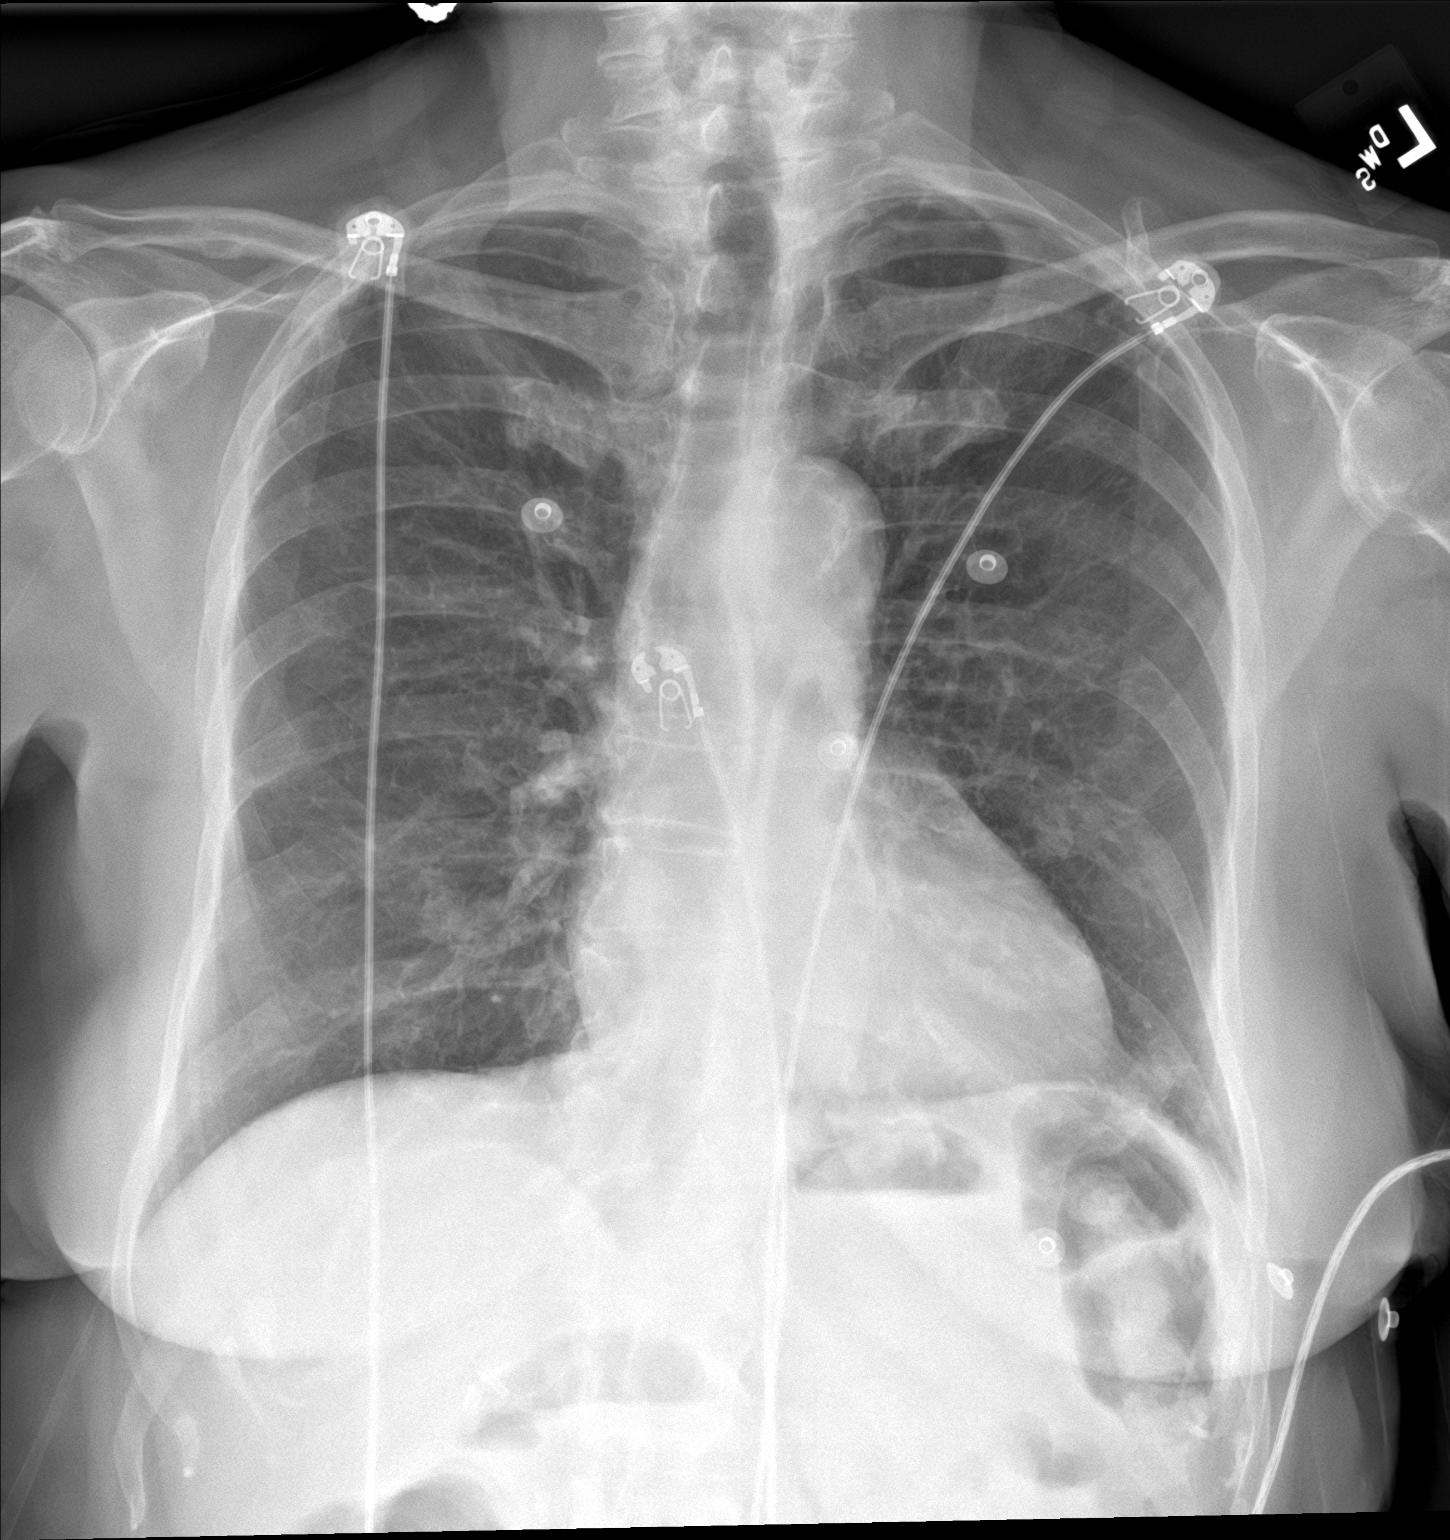

[chest lat]
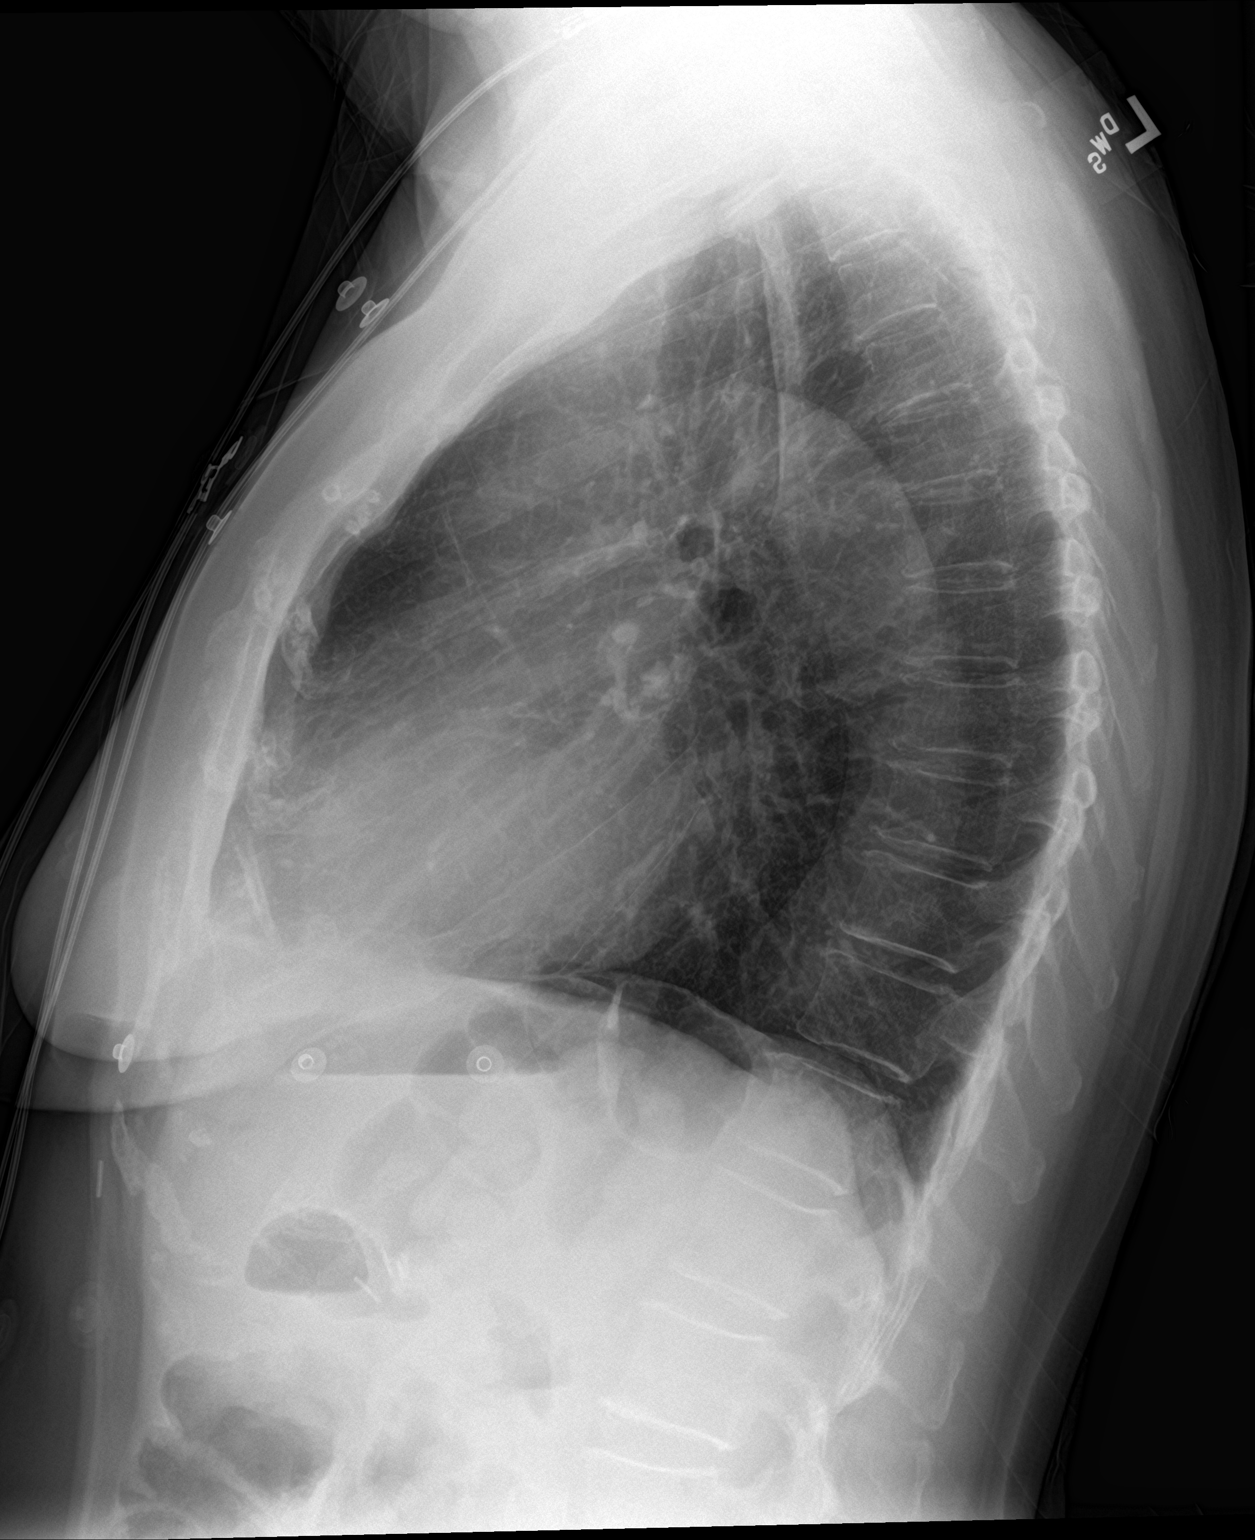

[2 of 2 positions shown; findings below may reference images not displayed]

FINDINGS: Note is made of repair of markedly enlarged hiatal hernia seen in
between the [DATE] and [DATE] prior studies. Cardiac
silhouette and mediastinal contours are within normal limits.
Moderate calcification within the aortic arch. Mild chronic
interstitial thickening. No focal airspace opacity to indicate
pneumonia. No pleural effusion or pneumothorax. Mild multilevel
degenerative disc changes of the thoracic spine. Cholecystectomy
clips.
IMPRESSION: No active cardiopulmonary disease.

Note is made of prior repair of large hiatal hernia seen on
[DATE] radiographs.

## 2021-09-22 MED ORDER — SODIUM CHLORIDE 0.9% FLUSH
3.0000 mL | Freq: Two times a day (BID) | INTRAVENOUS | Status: DC
  Administered 2021-09-22 – 2021-09-24 (×5): 3 mL via INTRAVENOUS

## 2021-09-22 MED ORDER — HEPARIN SODIUM (PORCINE) 5000 UNIT/ML IJ SOLN
5000.0000 [IU] | Freq: Three times a day (TID) | INTRAMUSCULAR | Status: DC
  Administered 2021-09-22 – 2021-09-24 (×5): 5000 [IU] via SUBCUTANEOUS
  Filled 2021-09-22 (×5): qty 1

## 2021-09-22 MED ORDER — SODIUM CHLORIDE 0.9 % IV SOLN: INTRAVENOUS | Status: AC

## 2021-09-22 MED ORDER — ONDANSETRON HCL 4 MG PO TABS: 4.0000 mg | ORAL_TABLET | Freq: Four times a day (QID) | ORAL | Status: DC | PRN

## 2021-09-22 MED ORDER — ATENOLOL 50 MG PO TABS
50.0000 mg | ORAL_TABLET | Freq: Every day | ORAL | Status: DC
  Administered 2021-09-23 – 2021-09-24 (×2): 50 mg via ORAL
  Filled 2021-09-22: qty 1
  Filled 2021-09-22: qty 2

## 2021-09-22 MED ORDER — ACETAMINOPHEN 325 MG PO TABS: 650.0000 mg | ORAL_TABLET | Freq: Four times a day (QID) | ORAL | Status: DC | PRN

## 2021-09-22 MED ORDER — LISINOPRIL 20 MG PO TABS
20.0000 mg | ORAL_TABLET | Freq: Two times a day (BID) | ORAL | Status: DC
  Administered 2021-09-22 – 2021-09-24 (×4): 20 mg via ORAL
  Filled 2021-09-22: qty 2
  Filled 2021-09-22 (×2): qty 1
  Filled 2021-09-22: qty 2

## 2021-09-22 MED ORDER — HYDROMORPHONE HCL 1 MG/ML IJ SOLN: 1.0000 mg | Freq: Once | INTRAMUSCULAR | Status: DC

## 2021-09-22 MED ORDER — FLAXSEED OIL 1000 MG PO CAPS: 1.0000 | ORAL_CAPSULE | Freq: Every day | ORAL | Status: DC

## 2021-09-22 MED ORDER — PANTOPRAZOLE SODIUM 40 MG PO TBEC
40.0000 mg | DELAYED_RELEASE_TABLET | Freq: Every day | ORAL | Status: DC
Start: 2021-09-23 — End: 2021-09-24
  Administered 2021-09-23 – 2021-09-24 (×2): 40 mg via ORAL
  Filled 2021-09-22 (×2): qty 1

## 2021-09-22 MED ORDER — ASPIRIN EC 81 MG PO TBEC
81.0000 mg | DELAYED_RELEASE_TABLET | Freq: Every day | ORAL | Status: DC
  Administered 2021-09-23 – 2021-09-24 (×2): 81 mg via ORAL
  Filled 2021-09-22 (×3): qty 1

## 2021-09-22 MED ORDER — ACETAMINOPHEN 650 MG RE SUPP: 650.0000 mg | Freq: Four times a day (QID) | RECTAL | Status: DC | PRN

## 2021-09-22 MED ORDER — ONDANSETRON HCL 4 MG/2ML IJ SOLN: 4.0000 mg | Freq: Four times a day (QID) | INTRAMUSCULAR | Status: DC | PRN

## 2021-09-22 NOTE — Consult Note (Signed)
?Cardiology Consultation:  ? ?Patient ID: Jessica Little ?MRN: [id number]; DOB: 1946/05/06 ? ?Admit date: 09/22/2021 ?Date of Consult: 09/22/2021 ? ?PCP:  Gwyneth Sprout, FNP ?  ?[facility] Providers ?Cardiologist:  [facility] ?Physician requesting consult: After Marcello Moores ? ? ?Patient Profile:  ? ?Jessica Little is a 76 y.o. female with a hx of hypertension, chronic renal failure, suspected vasovagal syncope in the setting of GI distress October 2022, January 2023, who presents with syncope ? ?History of Present Illness:  ? ?Jessica Little reports having prior episode of syncope October 2022 while at church.  She had acute nausea, diaphoresis, and syncope, was told that she was shaking when she was on the ground.  Work-up in the emergency room relatively unrevealing, CT head no acute findings, potassium 3.3 which was supplemented ? ?Second episode of syncope June 17, 2021 again while sitting at church, had GI upset, felt nauseous, woke up on the floor ? ?Has been seen by neurology for both episodes, work-up unrevealing ?MRI as below ? ?Reports today she ate 2 small muffins for breakfast with a glass of milk around 930 with her medications, was at [employer] shortly after 10 AM, getting out of the car felt lightheaded had to sit back down in the car until dizziness resolved. ?When she felt better walked into [employer], was checking out when she had dizziness, next thing she knows she fell backwards and was caught by people working there.  They held her up until EMTs arrived, she woke up in a sitting position ?Reports blood pressure was [id number] systolic presumably in a supine position.  ?Denied any GI trouble today ? ?Reports at home blood pressure typically running [id number] diastolic numbers, did not have any systolic pressures, does not check it regularly ? ?Denied any tachypalpitations at the time of her event ?Reports she has been taking lisinopril 20 twice daily with her atenolol in the morning.  Taken her  medications today ? ? ?MRI brain July 27, 2021 ?1. No acute intracranial abnormality. ?2. 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 ?3. Empty sella. ?  ?MRA HEAD IMPRESSION: ?  ?1. Negative intracranial MRA for large vessel occlusion. ?2. Mild to moderate short-segment stenosis at the origin of the left ?M1 segment. No other proximal high-grade or correctable stenosis. ?3. Distal small vessel atheromatous irregularity throughout the MCA ?and PCA branches bilaterally. ? ? ?Past Medical History:  ?Diagnosis Date  ? Anxiety   ? GERD (gastroesophageal reflux disease)   ? Hyperlipidemia   ? Hypertension   ? ? ?Past Surgical History:  ?Procedure Laterality Date  ? CATARACT EXTRACTION W/PHACO Left 01/18/2021  ? Procedure: CATARACT EXTRACTION PHACO AND INTRAOCULAR LENS PLACEMENT ([facility]) LEFT 7.40 01:11.7;  Surgeon: Leandrew Koyanagi, MD;  Location: [place];  Service: Ophthalmology;  Laterality: Left;  ? CATARACT EXTRACTION W/PHACO Right 02/01/2021  ? Procedure: CATARACT EXTRACTION PHACO AND INTRAOCULAR LENS PLACEMENT ([place]) RIGHT EYHANCE TORIC 8.60 01:09.9 ;  Surgeon: Leandrew Koyanagi, MD;  Location: [place];  Service: Ophthalmology;  Laterality: Right;  ? CHOLECYSTECTOMY    ? COLONOSCOPY  01/01/2006  ? COLONOSCOPY WITH PROPOFOL N/A 08/23/2015  ? Procedure: COLONOSCOPY WITH PROPOFOL;  Surgeon: Robert Bellow, MD;  Location: [facility] ENDOSCOPY;  Service: Endoscopy;  Laterality: N/A;  ? HIATAL HERNIA REPAIR  2019  ?  ? ?Home Medications:  ?Prior to Admission medications   ?Medication Sig Start Date End Date Taking? Authorizing Provider  ?aspirin EC 81 MG tablet Take 81 mg  by mouth daily. Swallow whole.    [provider]  ?atenolol (TENORMIN) 50 MG tablet Take 1 tablet by mouth once daily 09/07/21   Gwyneth Sprout, FNP  ?Biotin 10000 MCG TABS Take by mouth.    [provider]  ?calcium carbonate (OS-CAL) 600 MG TABS tablet Take 600 mg by mouth  daily with breakfast.    [provider]  ?Cholecalciferol (VITAMIN D3 PO) Take by mouth daily.    [provider]  ?diazepam (VALIUM) 5 MG tablet Take 1 tablet 30 mins prior to MRI. May take second dose if needed ?Patient not taking: Reported on 08/07/2021 07/25/21   Cameron Sprang, MD  ?Flaxseed, Linseed, (FLAXSEED OIL) 1000 MG CAPS Take by mouth.    [provider]  ?lisinopril (ZESTRIL) 20 MG tablet Take 2 tablets (40 mg total) by mouth daily. 09/11/21   Gwyneth Sprout, FNP  ?Magnesium 400 MG CAPS Take by mouth.    [provider]  ?NON FORMULARY Probiotic daily.    [provider]  ?nortriptyline (PAMELOR) 50 MG capsule Take 1 capsule (50 mg total) by mouth at bedtime. 08/23/21   Cottle, Billey Co., MD  ?omeprazole (PRILOSEC) 40 MG capsule Take 1 capsule (40 mg total) by mouth 2 (two) times daily. 07/12/21   Gwyneth Sprout, FNP  ?perphenazine (TRILAFON) 4 MG tablet TAKE 1 TABLET BY MOUTH AT BEDTIME 08/23/21   Cottle, Billey Co., MD  ?potassium chloride (KLOR-CON) 10 MEQ tablet Take 1 tablet by mouth once daily 02/28/21   Gwyneth Sprout, FNP  ?simvastatin (ZOCOR) 40 MG tablet Take 1 tablet (40 mg total) by mouth at bedtime. 08/03/21   Gwyneth Sprout, FNP  ?vitamin E 1000 UNIT capsule Take 1,000 Units by mouth daily.    [provider]  ?Zinc Sulfate (ZINC 15 PO) Take by mouth.    [provider]  ? ? ?Inpatient Medications: ?Scheduled Meds: ? heparin  5,000 Units Subcutaneous Q8H  ? sodium chloride flush  3 mL Intravenous Q12H  ? ?Continuous Infusions: ? sodium chloride    ? ?PRN Meds: ?acetaminophen **OR** acetaminophen, ondansetron **OR** ondansetron (ZOFRAN) IV ? ?Allergies:   No Known Allergies ? ?Social History:   ?Social History  ? ?Socioeconomic History  ? Marital status: Widowed  ?  Spouse name: Not on file  ? Number of children: 0  ? Years of education: College  ? Highest education level: Bachelor's degree (e.g., BA, AB, BS)  ?Occupational History   ? Occupation: Retired  ?Tobacco Use  ? Smoking status: Never  ? Smokeless tobacco: Never  ?Vaping Use  ? Vaping Use: Never used  ?Substance and Sexual Activity  ? Alcohol use: No  ? Drug use: No  ? Sexual activity: Not on file  ?Other Topics Concern  ? Not on file  ?Social History Narrative  ? Right handed  ? No caffeine  ? One story home  ? ?Social Determinants of Health  ? ?Financial Resource Strain: Low Risk   ? Difficulty of Paying Living Expenses: Not hard at all  ?Food Insecurity: No Food Insecurity  ? Worried About Charity fundraiser in the Last Year: Never true  ? Ran Out of Food in the Last Year: Never true  ?Transportation Needs: No Transportation Needs  ? Lack of Transportation (Medical): No  ? Lack of Transportation (Non-Medical): No  ?Physical Activity: Insufficiently Active  ? Days of Exercise per Week: 2 days  ? Minutes of Exercise  per Session: 40 min  ?Stress: No Stress Concern Present  ? Feeling of Stress : Not at all  ?Social Connections: Moderately Isolated  ? Frequency of Communication with Friends and Family: Twice a week  ? Frequency of Social Gatherings with Friends and Family: Three times a week  ? Attends Religious Services: More than 4 times per year  ? Active Member of Clubs or Organizations: No  ? Attends Archivist Meetings: Never  ? Marital Status: Widowed  ?Intimate Partner Violence: Not At Risk  ? Fear of Current or Ex-Partner: No  ? Emotionally Abused: No  ? Physically Abused: No  ? Sexually Abused: No  ?  ?Family History:   ? ?Family History  ?Problem Relation Age of Onset  ? Arrhythmia Mother   ? Heart attack Father   ? Stroke Father   ? Healthy Sister   ? Healthy Brother   ? Healthy Sister   ? Breast cancer Neg Hx   ?  ? ?ROS:  ?Please see the history of present illness.  ?Review of Systems  ?Constitutional: Negative.   ?HENT: Negative.    ?Respiratory: Negative.    ?Cardiovascular: Negative.   ?Gastrointestinal: Negative.   ?Musculoskeletal: Negative.   ?Neurological:   Positive for loss of consciousness.  ?Psychiatric/Behavioral: Negative.    ?All other systems reviewed and are negative. ? ? ?Physical Exam/Data:  ? ?Vitals:  ? 09/22/21 1300 09/22/21 1330 09/22/21 1400 04

## 2021-09-22 NOTE — ED Triage Notes (Addendum)
Patient to ER via [facility] from [facility].  ? ?Reports having a syncopal episode today. Hx of multiple syncopal episodes since October. Waiting on results from her neurologist after a 24hr EEG. Reports she was cleared by her cardiologist. Alert and oriented on arrival. ? ?VSS. Cbg 80, temp 98.  ?

## 2021-09-22 NOTE — ED Provider Notes (Signed)
? ?[facility] ?Provider Note ? ? ? Event Date/Time  ? First MD Initiated Contact with Patient 09/22/21 1209   ?  (approximate) ? ? ?History  ? ?Chief Complaint ?Loss of Consciousness ? ? ?HPI ? ?Jessica Little is a 76 y.o. female with PMHx of hypertension, HLD, GAD, and schizoaffective disorder who presents to the ED complaining of syncope. Patient reports that she was feeling lightheaded while at the grocery store just prior to arrival, eventually passed out while in the process of checking out.  She denies any associated chest pain or shortness of breath, states that employees were able to catch her so she did not fall and hit the ground.  She now states that she feels back to normal, denies any ongoing lightheadedness.  She states she has otherwise been feeling well recently with no fevers, cough, nausea, vomiting, abdominal pain, or dysuria.  She does state that she has passed out twice before with unremarkable work-up through cardiology.  She states that she had a normal echocardiogram and Holter monitor, has also seen a neurologist for possible seizure but is waiting on the results of any EEG. ? ?  ? ? ?Physical Exam  ? ?Triage Vital Signs: ?ED Triage Vitals  ?Enc Vitals Group  ?   BP 09/22/21 1145 106/71  ?   Pulse Rate 09/22/21 1144 93  ?   Resp 09/22/21 1144 18  ?   Temp 09/22/21 1149 97.7 ?F (36.5 ?C)  ?   Temp Source 09/22/21 1149 Oral  ?   SpO2 09/22/21 1144 100 %  ?   Weight --   ?   Height 09/22/21 1145 5\' 6"  (1.676 m)  ?   Head Circumference --   ?   Peak Flow --   ?   Pain Score 09/22/21 1145 0  ?   Pain Loc --   ?   Pain Edu? --   ?   Excl. in GC? --   ? ? ?Most recent vital signs: ?Vitals:  ? 09/22/21 1400 09/22/21 1430  ?BP: (!) 181/99 (!) 168/94  ?Pulse: 65 68  ?Resp: (!) 22 20  ?Temp:    ?SpO2: 100% 100%  ? ? ?Constitutional: Alert and oriented. ?Eyes: Conjunctivae are normal. ?Head: Atraumatic. ?Nose: No congestion/rhinnorhea. ?Mouth/Throat: Mucous membranes are moist.   ?Neck: No midline cervical spine tenderness to palpation. ?Cardiovascular: Normal rate, regular rhythm. Grossly normal heart sounds.  2+ radial pulses bilaterally. ?Respiratory: Normal respiratory effort.  No retractions. Lungs CTAB. ?Gastrointestinal: Soft and nontender. No distention. ?Musculoskeletal: No lower extremity tenderness nor edema.  ?Neurologic:  Normal speech and language. No gross focal neurologic deficits are appreciated. ? ? ? ?ED Results / Procedures / Treatments  ? ?Labs ?(all labs ordered are listed, but only abnormal results are displayed) ?Labs Reviewed  ?BASIC METABOLIC PANEL - Abnormal; Notable for the following components:  ?    Result Value  ? Glucose, Bld 46 (*)   ? BUN 31 (*)   ? Creatinine, Ser 1.59 (*)   ? GFR, Estimated 34 (*)   ? All other components within normal limits  ?CBC - Abnormal; Notable for the following components:  ? RBC 5.12 (*)   ? MCH 25.6 (*)   ? RDW 15.9 (*)   ? All other components within normal limits  ?CBG MONITORING, ED - Abnormal; Notable for the following components:  ? Glucose-Capillary 67 (*)   ? All other components within normal limits  ?URINALYSIS, ROUTINE W  REFLEX MICROSCOPIC  ?CBG MONITORING, ED  ?TROPONIN I (HIGH SENSITIVITY)  ? ? ? ?EKG ? ?ED ECG REPORT ?Harriet Masson, the attending physician, personally viewed and interpreted this ECG. ? ? Date: 09/22/2021 ? EKG Time: 11:49 ? Rate: 74 ? Rhythm: normal sinus rhythm ? Axis: Normal ? Intervals:none ? ST&T Change: Inferolateral T wave inversions ? ? ?PROCEDURES: ? ?Critical Care performed: No ? ?.1-3 Lead EKG Interpretation ?Performed by: Chesley Noon, MD ?Authorized by: Chesley Noon, MD  ? ?  Interpretation: normal   ?  ECG rate:  65-80 ?  ECG rate assessment: normal   ?  Rhythm: sinus rhythm   ?  Ectopy: none   ?  Conduction: normal   ? ? ?MEDICATIONS ORDERED IN ED: ?Medications - No data to display ? ? ?IMPRESSION / MDM / ASSESSMENT AND PLAN / ED COURSE  ?I reviewed the triage vital signs and  the nursing notes. ?             ?               ? ?76 y.o. female with past medical history of hypertension, hyperlipidemia, generalized anxiety disorder, and schizoaffective disorder who presents to the ED complaining of lightheadedness followed by syncopal episode while checking out at the grocery store earlier today. ? ?Differential diagnosis includes, but is not limited to, arrhythmia, ACS, PE, electrolyte abnormality, dehydration, vasovagal episode, seizure. ? ?Patient nontoxic-appearing and in no acute distress, vital signs are unremarkable, EKG shows no evidence of arrhythmia but does show ST/T wave changes inferolaterally which are new compared to her most recent EKG.  This raises concern for cardiac etiology of syncope and we will observe on cardiac monitor, check serial troponins.  BMP shows stable chronic kidney disease with no acute electrolyte abnormality, CBC without significant anemia or leukocytosis.  Patient was noted to be hypoglycemic, however she has no history of diabetes and does not take any medication for diabetes.  She was given something to eat and blood glucose is now gradually improving.  Patient would likely benefit from observation admission due to concern for cardiac etiology of syncope. ? ?Troponin within normal limits and blood glucose now continues to improve.  Patient continues to feel well and no events noted on cardiac monitor, however with abnormal EKG we will plan for admission for observation.  Case discussed with hospitalist for admission. ? ?The patient is on the cardiac monitor to evaluate for evidence of arrhythmia and/or significant heart rate changes. ? ?  ? ? ?FINAL CLINICAL IMPRESSION(S) / ED DIAGNOSES  ? ?Final diagnoses:  ?Syncope, unspecified syncope type  ?Abnormal EKG  ?Hypoglycemia  ? ? ? ?Rx / DC Orders  ? ?ED Discharge Orders   ? ? None  ? ?  ? ? ? ?Note:  This document was prepared using Dragon voice recognition software and may include unintentional  dictation errors. ?  ?Chesley Noon, MD ?09/22/21 1452 ? ?

## 2021-09-22 NOTE — H&P (Signed)
?History and Physical  ? ? ?Jessica Little WUJ:[id number] DOB: Jan 13, 1946 DOA: 09/22/2021 ? ?PCP: Jacky Kindle, FNP  ?Patient coming from: home  ? ?I have personally briefly reviewed patient's old medical records in [facility] ? ?Chief Complaint: recurrent syncope  ? ?HPI: Jessica Little is a 76 y.o. female with medical history significant of hypertension, hyperlipidemia, depression, history of recurrent syncope episodes starting 02/2021. Patient has had cardiology evaluation with normal echo, holter noting SVT but with patient triggered spells correlating to normal sinus rhythm. She was also seen by neurology and had ambulatory EEG which noted not signs of seizure activity. Patient present today to ED s/p episode of syncope for further evaluation. Per patient she was in the grocery check out line when she fainted. She noted no flushing or nausea as with her prior episodes or HA. She notes today she was very weak and felt dizzy. She notes no associated chest pain, sob, with episode. She also denies f/c/dysuria/ / cough or sore throat.  + weight loss.+intermittent diarrhea. ? ? ? ?ED Course: ?Hr93, rr 18, bp 106/71, 137/97,184/100, 194/99,Sat 100%  ?EKG:nsr 74 , with noted twave inversion in inferior/lat leads a change from prior EKG  ?Wbc: 7, hgb 13.1, plt 270 ?Na139, K 3.7, glu 95,62,13 , cr 1.59 at baseline ?Ce 7 ? ?Review of Systems: As per HPI otherwise 10 point review of systems negative.  ? ?Past Medical History:  ?Diagnosis Date  ? Anxiety   ? GERD (gastroesophageal reflux disease)   ? Hyperlipidemia   ? Hypertension   ? ? ?Past Surgical History:  ?Procedure Laterality Date  ? CATARACT EXTRACTION W/PHACO Left 01/18/2021  ? Procedure: CATARACT EXTRACTION PHACO AND INTRAOCULAR LENS PLACEMENT (IOC) LEFT 7.40 01:11.7;  Surgeon: Lockie Mola, MD;  Location: [facility];  Service: Ophthalmology;  Laterality: Left;  ? CATARACT EXTRACTION W/PHACO Right 02/01/2021  ? Procedure: CATARACT EXTRACTION  PHACO AND INTRAOCULAR LENS PLACEMENT (IOC) RIGHT EYHANCE TORIC 8.60 01:09.9 ;  Surgeon: Lockie Mola, MD;  Location: [facility];  Service: Ophthalmology;  Laterality: Right;  ? CHOLECYSTECTOMY    ? COLONOSCOPY  01/01/2006  ? COLONOSCOPY WITH PROPOFOL N/A 08/23/2015  ? Procedure: COLONOSCOPY WITH PROPOFOL;  Surgeon: Earline Mayotte, MD;  Location: [facility] ENDOSCOPY;  Service: Endoscopy;  Laterality: N/A;  ? HIATAL HERNIA REPAIR  2019  ? ? ? reports that she has never smoked. She has never used smokeless tobacco. She reports that she does not drink alcohol and does not use drugs. ? ?No Known Allergies ? ?Family History  ?Problem Relation Age of Onset  ? Arrhythmia Mother   ? Heart attack Father   ? Stroke Father   ? Healthy Sister   ? Healthy Brother   ? Healthy Sister   ? Breast cancer Neg Hx   ? ? ?Prior to Admission medications   ?Medication Sig Start Date End Date Taking? Authorizing Provider  ?aspirin EC 81 MG tablet Take 81 mg by mouth daily. Swallow whole.    [provider]  ?atenolol (TENORMIN) 50 MG tablet Take 1 tablet by mouth once daily 09/07/21   Jacky Kindle, FNP  ?Biotin 08657 MCG TABS Take by mouth.    [provider]  ?calcium carbonate (OS-CAL) 600 MG TABS tablet Take 600 mg by mouth daily with breakfast.    [provider]  ?Cholecalciferol (VITAMIN D3 PO) Take by mouth daily.    [provider]  ?diazepam (VALIUM) 5 MG tablet Take 1  tablet 30 mins prior to MRI. May take second dose if needed ?Patient not taking: Reported on 08/07/2021 07/25/21   Van Clines, MD  ?Flaxseed, Linseed, (FLAXSEED OIL) 1000 MG CAPS Take by mouth.    [provider]  ?lisinopril (ZESTRIL) 20 MG tablet Take 2 tablets (40 mg total) by mouth daily. 09/11/21   Jacky Kindle, FNP  ?Magnesium 400 MG CAPS Take by mouth.    [provider]  ?NON FORMULARY Probiotic daily.    [provider]  ?nortriptyline (PAMELOR) 50 MG capsule Take 1 capsule (50  mg total) by mouth at bedtime. 08/23/21   Cottle, Steva Ready., MD  ?omeprazole (PRILOSEC) 40 MG capsule Take 1 capsule (40 mg total) by mouth 2 (two) times daily. 07/12/21   Jacky Kindle, FNP  ?perphenazine (TRILAFON) 4 MG tablet TAKE 1 TABLET BY MOUTH AT BEDTIME 08/23/21   Cottle, Steva Ready., MD  ?potassium chloride (KLOR-CON) 10 MEQ tablet Take 1 tablet by mouth once daily 02/28/21   Jacky Kindle, FNP  ?simvastatin (ZOCOR) 40 MG tablet Take 1 tablet (40 mg total) by mouth at bedtime. 08/03/21   Jacky Kindle, FNP  ?vitamin E 1000 UNIT capsule Take 1,000 Units by mouth daily.    [provider]  ?Zinc Sulfate (ZINC 15 PO) Take by mouth.    [provider]  ? ? ?Physical Exam: ?Vitals:  ? 09/22/21 1300 09/22/21 1330 09/22/21 1400 09/22/21 1430  ?BP: (!) 184/100 (!) 194/99 (!) 181/99 (!) 168/94  ?Pulse: 65 66 65 68  ?Resp: (!) 23 (!) 23 (!) 22 20  ?Temp:      ?TempSrc:      ?SpO2: 100% 100% 100% 100%  ?Height:      ? ? ? ?Vitals:  ? 09/22/21 1300 09/22/21 1330 09/22/21 1400 09/22/21 1430  ?BP: (!) 184/100 (!) 194/99 (!) 181/99 (!) 168/94  ?Pulse: 65 66 65 68  ?Resp: (!) 23 (!) 23 (!) 22 20  ?Temp:      ?TempSrc:      ?SpO2: 100% 100% 100% 100%  ?Height:      ?Constitutional: NAD, calm, comfortable ?Eyes: PERRL, lids and conjunctivae normal ?ENMT: Mucous membranes are moist. Posterior pharynx clear of any exudate or lesions.Normal dentition.  ?Neck: normal, supple, no masses, no thyromegaly ?Respiratory: clear to auscultation bilaterally, no wheezing, no crackles. Normal respiratory effort. No accessory muscle use.  ?Cardiovascular: Regular rate and rhythm, no murmurs / rubs / gallops. No extremity edema. 2+ pedal pulses. No carotid bruits.  ?Abdomen: no tenderness, no masses palpated. No hepatosplenomegaly. Bowel sounds positive.  ?Musculoskeletal: no clubbing / cyanosis. No joint deformity upper and lower extremities. Good ROM, no contractures. Normal muscle tone.  ?Skin: no rashes, lesions,  ulcers. No induration ?Neurologic: CN 2-12 grossly intact. Sensation intact, Strength 5/5 in all 4.  ?Psychiatric: Normal judgment and insight. Alert and oriented x 3. Normal mood.  ? ? ?Labs on Admission: I have personally reviewed following labs and imaging studies ? ?CBC: ?Recent Labs  ?Lab 09/22/21 ?1152  ?WBC 7.3  ?HGB 13.1  ?HCT 42.4  ?MCV 82.8  ?PLT 270  ? ?Basic Metabolic Panel: ?Recent Labs  ?Lab 09/22/21 ?1152  ?NA 139  ?K 3.7  ?CL 104  ?CO2 25  ?GLUCOSE 46*  ?BUN 31*  ?CREATININE 1.59*  ?CALCIUM 9.3  ? ?GFR: ?Estimated Creatinine Clearance: 31.9 mL/min (A) (by C-G formula based on SCr of 1.59 mg/dL (H)). ?Liver Function Tests: ?No results for  input(s): AST, ALT, ALKPHOS, BILITOT, PROT, ALBUMIN in the last 168 hours. ?No results for input(s): LIPASE, AMYLASE in the last 168 hours. ?No results for input(s): AMMONIA in the last 168 hours. ?Coagulation Profile: ?No results for input(s): INR, PROTIME in the last 168 hours. ?Cardiac Enzymes: ?No results for input(s): CKTOTAL, CKMB, CKMBINDEX, TROPONINI in the last 168 hours. ?BNP (last 3 results) ?No results for input(s): PROBNP in the last 8760 hours. ?HbA1C: ?No results for input(s): HGBA1C in the last 72 hours. ?CBG: ?Recent Labs  ?Lab 09/22/21 ?1303 09/22/21 ?1408  ?GLUCAP 67* 83  ? ?Lipid Profile: ?No results for input(s): CHOL, HDL, LDLCALC, TRIG, CHOLHDL, LDLDIRECT in the last 72 hours. ?Thyroid Function Tests: ?No results for input(s): TSH, T4TOTAL, FREET4, T3FREE, THYROIDAB in the last 72 hours. ?Anemia Panel: ?No results for input(s): VITAMINB12, FOLATE, FERRITIN, TIBC, IRON, RETICCTPCT in the last 72 hours. ?Urine analysis: ?   ?Component Value Date/Time  ? COLORURINE YELLOW (A) 03/19/2021 1221  ? APPEARANCEUR Clear 05/04/2021 0955  ? LABSPEC 1.025 03/19/2021 1221  ? LABSPEC 1.018 04/30/2014 1625  ? PHURINE 5.0 03/19/2021 1221  ? GLUCOSEU Negative 05/04/2021 0955  ? GLUCOSEU Negative 04/30/2014 1625  ? HGBUR NEGATIVE 03/19/2021 1221  ? BILIRUBINUR  Negative 05/04/2021 0955  ? BILIRUBINUR Negative 04/30/2014 1625  ? KETONESUR 5 (A) 03/19/2021 1221  ? PROTEINUR Negative 05/04/2021 0955  ? PROTEINUR 30 (A) 03/19/2021 1221  ? NITRITE Negative 05/04/2021 0

## 2021-09-22 NOTE — Progress Notes (Signed)
PT Cancellation Note ? ?Patient Details ?Name: Jessica Little ?MRN: [id number] ?DOB: [birth year] ? ? ?Cancelled Treatment:    Reason Eval/Treat Not Completed: PT screened, no needs identified, will sign off PT orders received, chart reviewed. Nurse reports pt was ambulatory to the bathroom without assistance earlier. Pt received in bed reporting no issues with gait to bathroom earlier, no changes in baseline, and no concerns re: mobility. PT to d/c current orders & sign off at this time. Please re-consult if new needs arise. ? ?Aleda Grana, PT, DPT ?09/22/21, 3:44 PM ? ? ?[name] ?09/22/2021, 3:44 PM ?

## 2021-09-22 NOTE — Progress Notes (Signed)
OT Cancellation Note ? ?Patient Details ?Name: Jessica Little ?MRN: [id number] ?DOB: Mar 01, 1946 ? ? ?Cancelled Treatment:    Reason Eval/Treat Not Completed: OT screened, no needs identified, will sign off. Pt and staff reporting patient at baseline for functional mobility and self care as she is independently ambulating in room for toileting needs. OT to sign off.  ? ?Darleen Crocker, [place], OTR/L , CBIS ?ascom [phone]  ?09/22/21, 3:54 PM  ?

## 2021-09-22 NOTE — Progress Notes (Signed)
PHARMACIST - PHYSICIAN ORDER COMMUNICATION ? ?CONCERNING: P&T Medication Policy on Herbal Medications ? ?DESCRIPTION:  This patient?s order for:  Flaxseed oil  has been noted. ? ?This product(s) is classified as an ?herbal? or natural product. ?Due to a lack of definitive safety studies or FDA approval, nonstandard manufacturing practices, plus the potential risk of unknown drug-drug interactions while on inpatient medications, the Pharmacy and Therapeutics Committee does not permit the use of ?herbal? or natural products of this type within [facility]. ?  ?ACTION TAKEN: ?The pharmacy department is unable to verify this order at this time and your patient has been informed of this safety policy. ?Please reevaluate patient?s clinical condition at discharge and address if the herbal or natural product(s) should be resumed at that time. ? ? ?Pammie Chirino Rodriguez-Guzman PharmD, BCPS ?09/22/2021 8:46 PM ? ?

## 2021-09-22 NOTE — ED Notes (Signed)
noted pt's BP elevated, Zestril given. will recheck. pt appears comfortable sitting on bed, reading a book ?

## 2021-09-23 ENCOUNTER — Inpatient Hospital Stay: Payer: Medicare Other

## 2021-09-23 DIAGNOSIS — I10 Essential (primary) hypertension: Secondary | ICD-10-CM | POA: Diagnosis not present

## 2021-09-23 DIAGNOSIS — I6523 Occlusion and stenosis of bilateral carotid arteries: Secondary | ICD-10-CM | POA: Diagnosis not present

## 2021-09-23 DIAGNOSIS — R55 Syncope and collapse: Secondary | ICD-10-CM | POA: Diagnosis not present

## 2021-09-23 DIAGNOSIS — E782 Mixed hyperlipidemia: Secondary | ICD-10-CM | POA: Diagnosis not present

## 2021-09-23 LAB — GLUCOSE, CAPILLARY: Glucose-Capillary: 108 mg/dL — ABNORMAL HIGH (ref 70–99)

## 2021-09-23 LAB — CBG MONITORING, ED
Glucose-Capillary: 101 mg/dL — ABNORMAL HIGH (ref 70–99)
Glucose-Capillary: 106 mg/dL — ABNORMAL HIGH (ref 70–99)

## 2021-09-23 IMAGING — US US CAROTID DUPLEX BILAT
1 series · 13 of 24 positions shown · non-contrast
Comparison: None.

CLINICAL DATA: Syncope, collapse.  Hypertension, hyperlipidemia.

EXAM:
BILATERAL CAROTID DUPLEX ULTRASOUND
TECHNIQUE: Gray scale imaging, color Doppler and duplex ultrasound were
performed of bilateral carotid and vertebral arteries in the neck.

[Series 1: us carotid bilateral · 13 of 73 slices shown]
[im 1/73]
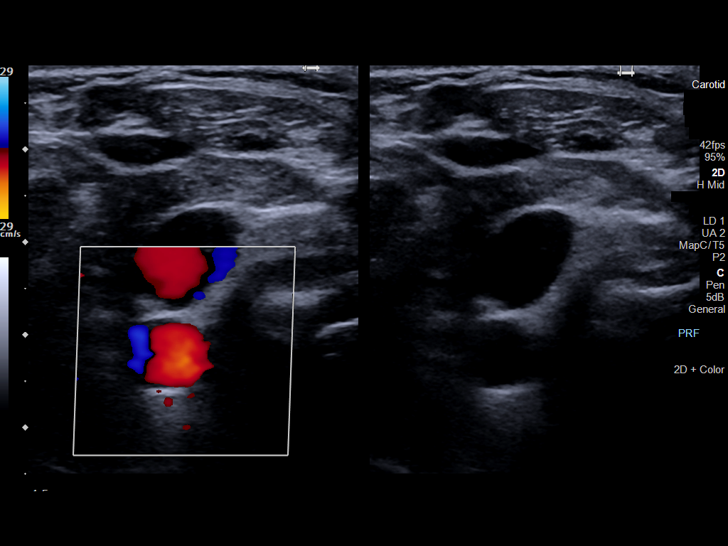
[im 7/73]
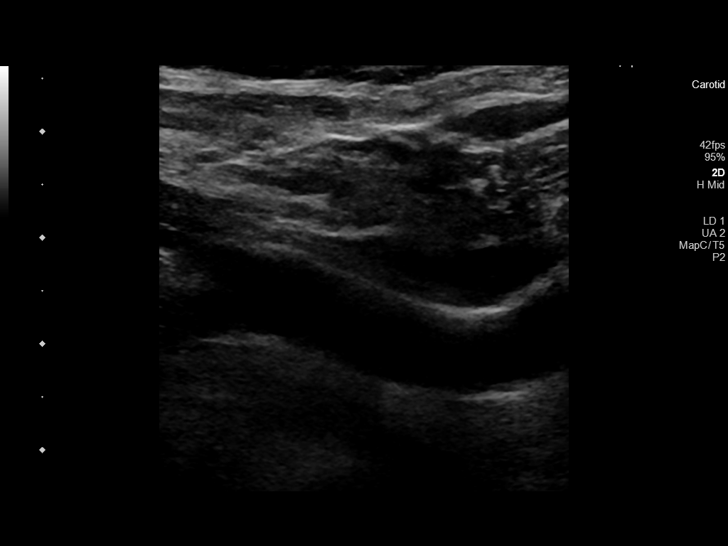
[im 13/73]
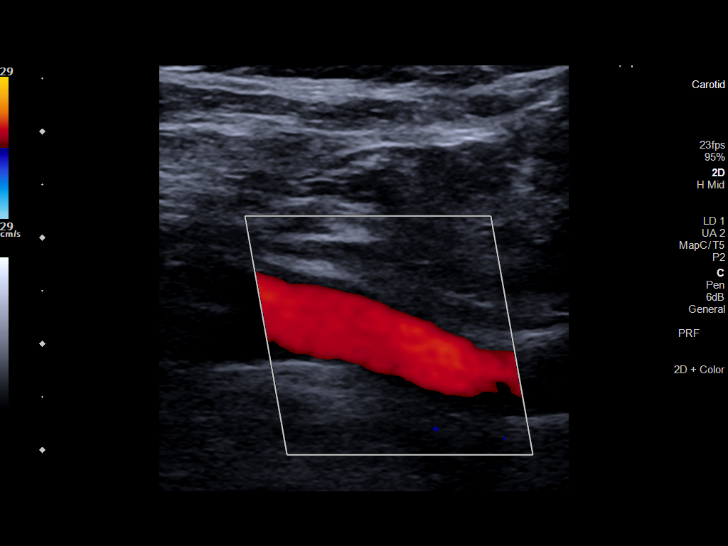
[im 19/73]
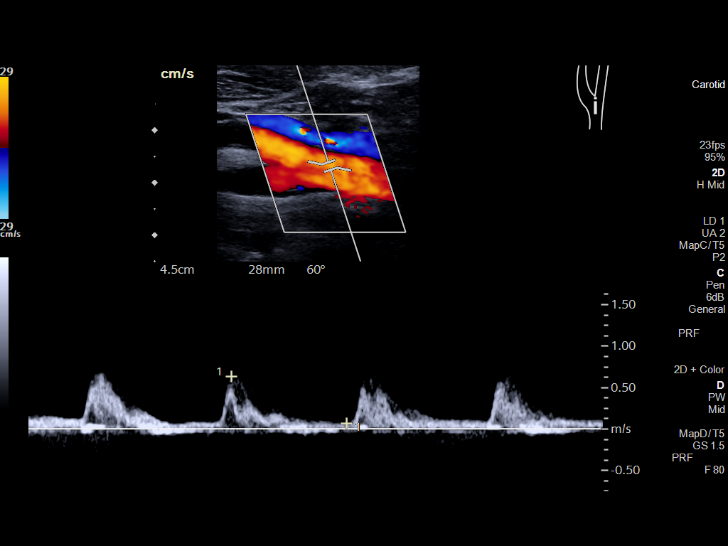
[im 26/73]
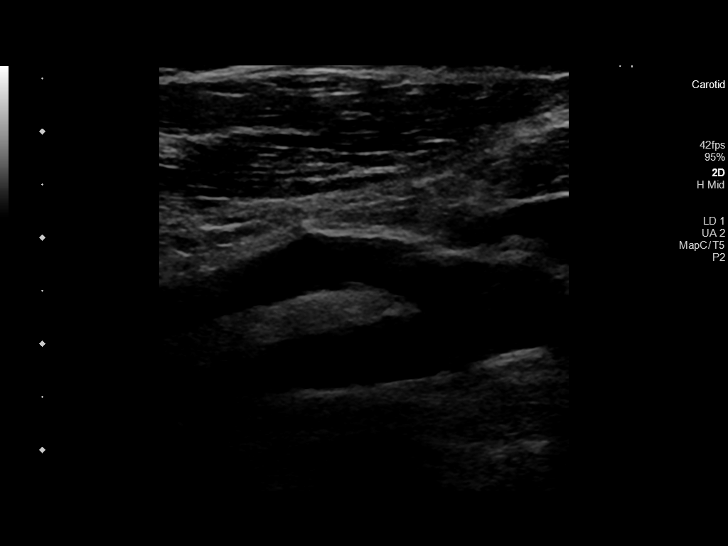
[im 32/73]
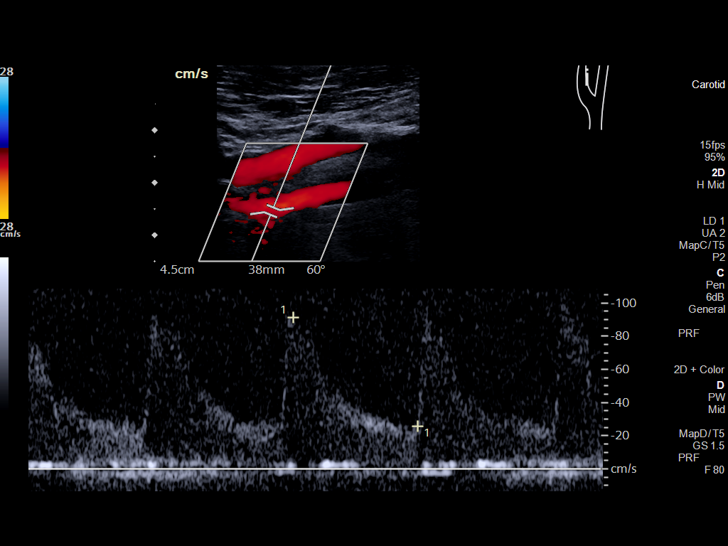
[im 38/73]
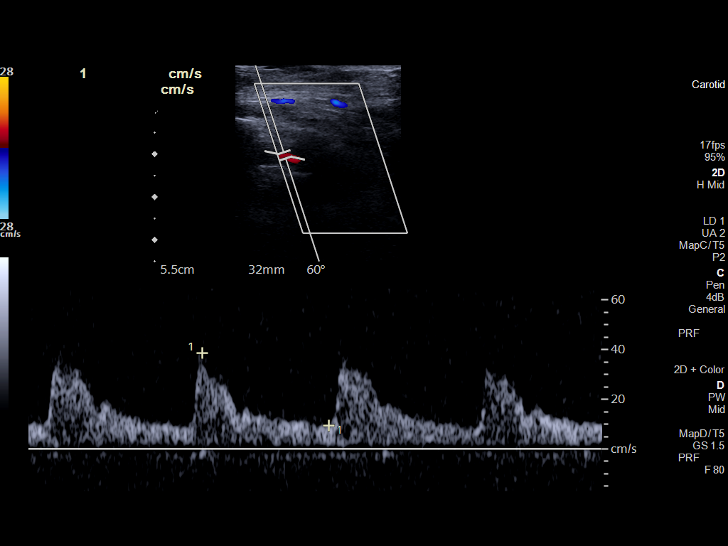
[im 41/73]
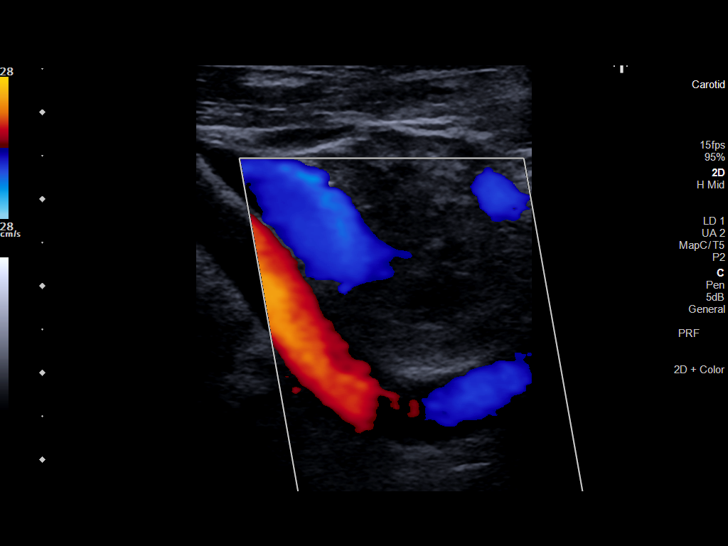
[im 47/73]
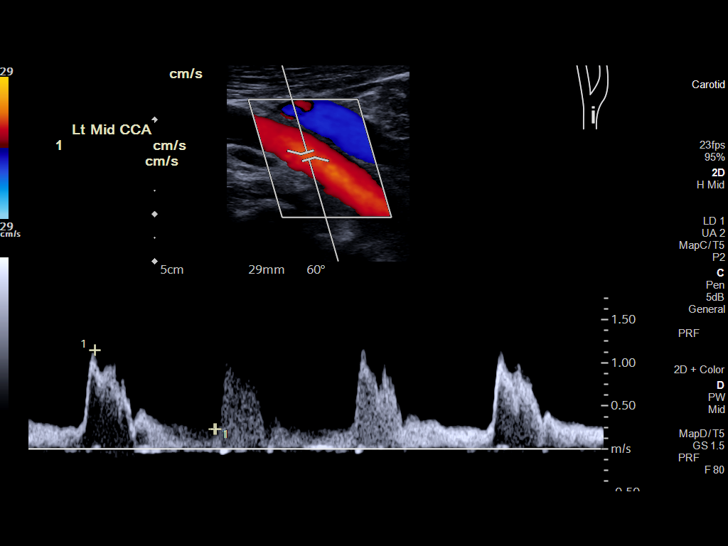
[im 54/73]
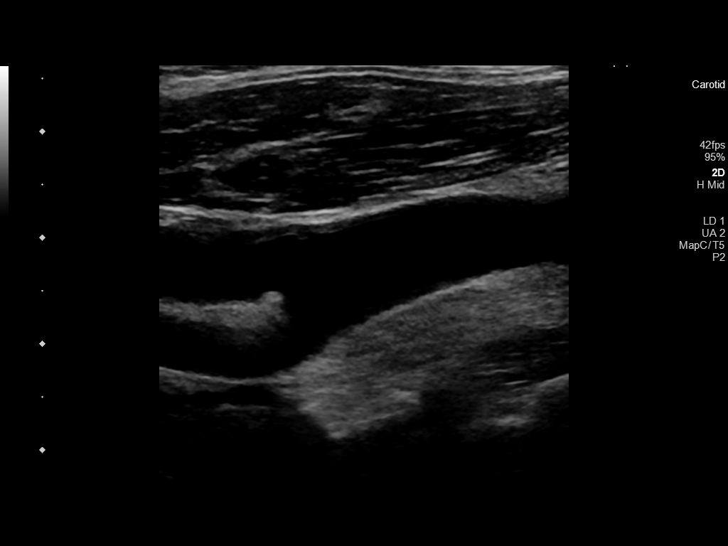
[im 60/73]
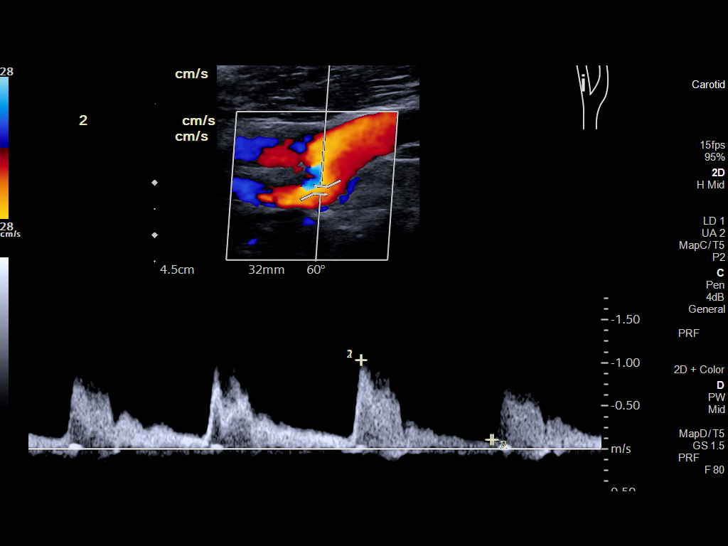
[im 66/73]
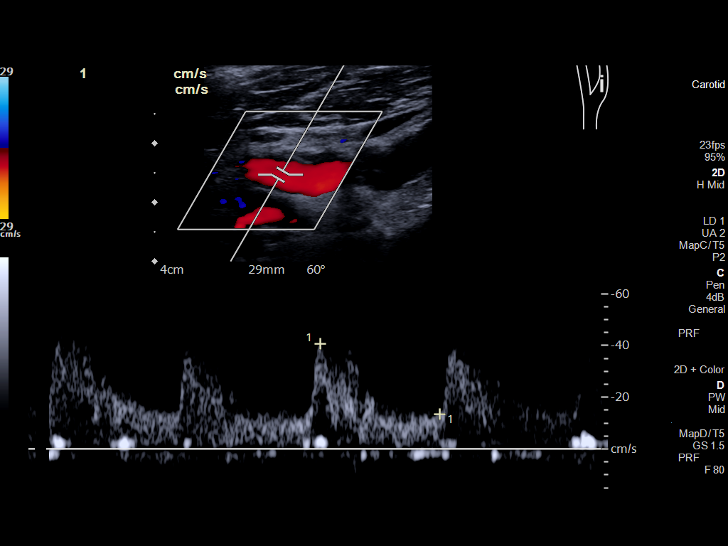
[im 73/73]
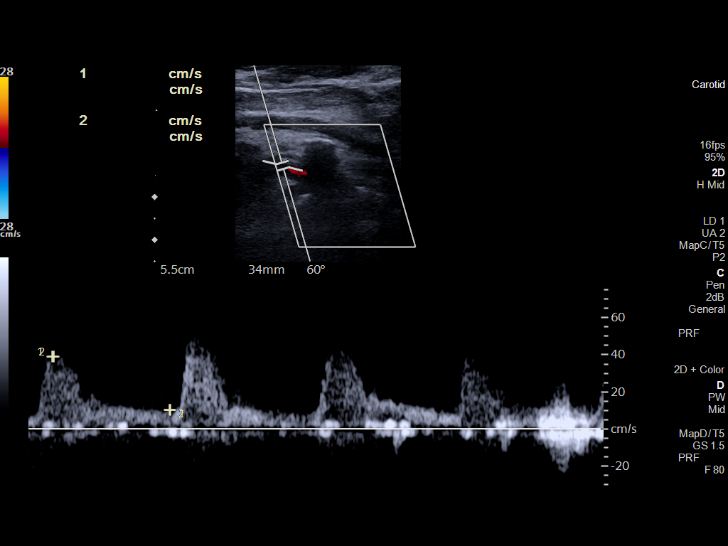

[13 of 24 positions shown; findings below may reference images not displayed]

FINDINGS: Criteria: Quantification of carotid stenosis is based on velocity
parameters that correlate the residual internal carotid diameter
with NASCET-based stenosis levels, using the diameter of the distal
internal carotid lumen as the denominator for stenosis measurement.

The following velocity measurements were obtained:

RIGHT

ICA: 91/26 cm/sec

CCA: 66/12 cm/sec

SYSTOLIC ICA/CCA RATIO:

ECA: 117 cm/sec

LEFT

ICA: 81/29 cm/sec

CCA: 56/15 cm/sec

SYSTOLIC ICA/CCA RATIO:

ECA: 103 cm/sec

RIGHT CAROTID ARTERY: Mild tortuosity. Mild smooth plaque in the
bulb and proximal ICA without high-grade stenosis. Normal waveforms
and color Doppler signal.

RIGHT VERTEBRAL ARTERY:  Normal flow direction and waveform.

LEFT CAROTID ARTERY: Mild tortuosity. Mild plaque in the carotid
bifurcation and proximal ICA with only mild stenosis. Normal
waveforms and color Doppler signal throughout.

LEFT VERTEBRAL ARTERY:  Normal flow direction and waveform.
IMPRESSION: 1. Bilateral carotid bifurcation plaque resulting in less than 50%
diameter ICA stenosis.
2. Antegrade bilateral vertebral arterial flow.

## 2021-09-23 MED ORDER — NORTRIPTYLINE HCL 25 MG PO CAPS
50.0000 mg | ORAL_CAPSULE | Freq: Every day | ORAL | Status: DC
  Administered 2021-09-23: 50 mg via ORAL
  Filled 2021-09-23: qty 2

## 2021-09-23 MED ORDER — PERPHENAZINE 4 MG PO TABS
4.0000 mg | ORAL_TABLET | Freq: Every day | ORAL | Status: DC
Start: 2021-09-23 — End: 2021-09-24
  Administered 2021-09-23: 4 mg via ORAL
  Filled 2021-09-23: qty 1

## 2021-09-23 NOTE — Progress Notes (Signed)
? ?Progress Note ? ?Patient Name: Jessica Little ?Date of Encounter: 09/23/2021 ? ?[facility] Cardiologist: Dr. Garen Lah ? ?Subjective  ? ?Patient overall feeling well. EKG and orthostatics still pending. Bps very high at times although reports orthostatic symptoms. No chest pain or SOB.  ? ?Inpatient Medications  ?  ?Scheduled Meds: ? aspirin EC  81 mg Oral Daily  ? atenolol  50 mg Oral Daily  ? heparin  5,000 Units Subcutaneous Q8H  ? lisinopril  20 mg Oral BID  ? pantoprazole  40 mg Oral Daily  ? sodium chloride flush  3 mL Intravenous Q12H  ? ?Continuous Infusions: ? ?PRN Meds: ?acetaminophen **OR** acetaminophen, ondansetron **OR** ondansetron (ZOFRAN) IV  ? ?Vital Signs  ?  ?Vitals:  ? 09/23/21 0100 09/23/21 0200 09/23/21 0400 09/23/21 0600  ?BP: (!) 197/80 (!) 206/94 (!) 185/92 (!) 177/77  ?Pulse: 79 62 61 68  ?Resp: (!) 25 20 (!) 25 18  ?Temp:    98.1 ?F (36.7 ?C)  ?TempSrc:    Oral  ?SpO2: 100% 100% 97% 100%  ?Height:      ? ? ?Intake/Output Summary (Last 24 hours) at 09/23/2021 0727 ?Last data filed at 09/23/2021 0441 ?Gross per 24 hour  ?Intake 444.17 ml  ?Output --  ?Net 444.17 ml  ? ? ?  09/11/2021  ?  2:02 PM 08/07/2021  ? 12:56 PM 08/03/2021  ?  2:37 PM  ?Last 3 Weights  ?Weight (lbs) 156 lb 14.4 oz 159 lb 159 lb  ?Weight (kg) 71.169 kg 72.122 kg 72.122 kg  ?   ? ?Telemetry  ?  ?NSR, HR 70s, PVCs, 5 beats NSVT - Personally Reviewed ? ?ECG  ?  ?pending - Personally Reviewed ? ?Physical Exam  ? ?GEN: No acute distress.   ?Neck: No JVD ?Cardiac: RRR, no murmurs, rubs, or gallops.  ?Respiratory: Clear to auscultation bilaterally. ?GI: Soft, nontender, non-distended  ?MS: No edema; No deformity. ?Neuro:  Nonfocal  ?Psych: Normal affect  ? ?Labs  ?  ?High Sensitivity Troponin:   ?Recent Labs  ?Lab 09/22/21 ?1152  ?TROPONINIHS 7  ?   ?Chemistry ?Recent Labs  ?Lab 09/22/21 ?1152 09/22/21 ?1617  ?NA 139  --   ?K 3.7  --   ?CL 104  --   ?CO2 25  --   ?GLUCOSE 46*  --   ?BUN 31*  --   ?CREATININE 1.59* 1.53*   ?CALCIUM 9.3  --   ?MG  --  2.4  ?GFRNONAA 34* 36*  ?ANIONGAP 10  --   ?  ?Lipids No results for input(s): CHOL, TRIG, HDL, LABVLDL, LDLCALC, CHOLHDL in the last 168 hours.  ?Hematology ?Recent Labs  ?Lab 09/22/21 ?1152 09/22/21 ?1617  ?WBC 7.3 9.7  ?RBC 5.12* 5.18*  ?HGB 13.1 13.4  ?HCT 42.4 43.0  ?MCV 82.8 83.0  ?MCH 25.6* 25.9*  ?MCHC 30.9 31.2  ?RDW 15.9* 16.1*  ?PLT 270 223  ? ?Thyroid  ?Recent Labs  ?Lab 09/22/21 ?1617  ?TSH 1.126  ?  ?BNPNo results for input(s): BNP, PROBNP in the last 168 hours.  ?DDimer No results for input(s): DDIMER in the last 168 hours.  ? ?Radiology  ?  ?X-ray chest PA and lateral ? ?Result Date: 09/22/2021 ?CLINICAL DATA:  Syncope. EXAM: CHEST - 2 VIEW COMPARISON:  Chest two views 06/27/2017 and 07/22/2015; CT chest 09/06/2017; CT abdomen and pelvis 05/03/2021 FINDINGS: Note is made of repair of markedly enlarged hiatal hernia seen in between the 09/06/2017 and 05/03/2021 prior studies. Cardiac  silhouette and mediastinal contours are within normal limits. Moderate calcification within the aortic arch. Mild chronic interstitial thickening. No focal airspace opacity to indicate pneumonia. No pleural effusion or pneumothorax. Mild multilevel degenerative disc changes of the thoracic spine. Cholecystectomy clips. IMPRESSION: No active cardiopulmonary disease. Note is made of prior repair of large hiatal hernia seen on 06/27/2017 radiographs. Electronically Signed   By: Yvonne Kendall M.D.   On: 09/22/2021 15:28   ? ?Cardiac Studies  ? ?Echo 04/2021 ? ? 1. Left ventricular ejection fraction, by estimation, is 60 to 65%. The  ?left ventricle has normal function. The left ventricle has no regional  ?wall motion abnormalities. There is mild left ventricular hypertrophy.  ?Left ventricular diastolic parameters  ?are consistent with Grade I diastolic dysfunction (impaired relaxation).  ?The average left ventricular global longitudinal strain is -16.4 %. The  ?global longitudinal strain is  normal.  ? 2. Right ventricular systolic function is normal. The right ventricular  ?size is normal. There is normal pulmonary artery systolic pressure. The  ?estimated right ventricular systolic pressure is 0000000 mmHg.  ? 3. A small pericardial effusion is present.  ? 4. The mitral valve is normal in structure. Mild mitral valve  ?regurgitation. No evidence of mitral stenosis.  ? ? ?Patient Profile  ?   ?76 y.o. female with a hx of hypertension, chronic renal failure, suspected vasovagal syncope in the setting of GI distress October 2022, January 2023, who presents with syncope. ? ?Assessment & Plan  ?  ?Syncope ?- unclear clear etiology, orthostasis with labile Bps  vs hypoglycemic vs vasovagal. Sounds vasovagal ?- prior cardiology work-up for syncope with normal echo and holter with brief SVT ?- orthostatics ordered ?- lisinopril was previously increased. Lisinopril 20mg  BID and atenolol 50mg . Bps significantly elevated at times, however want to allow for high BP given possible orthostasis ? - telemetry is unremarkable ?- echo 04/2021 with normal LVSF ?- will need heart monitor at d/c, may consider ILR ?- check US carotids ? ?Abnormal EKG ?- anterolateral changes on EKG on admission. Troponin negative.  ?- EKG this AM ?- no ischemic symptoms ?- may need OP ischemic work-up ? ?HTN ?- Atenolol 50mg  ?- lisinopril 20mg  BID ?- Bps high but concern for orthostasis, no changes for now ? ?For questions or updates, please contact [place] ?Please consult [email or web address] for contact info under  ? ?  ?   ?Signed, ?Eliam Snapp Ninfa Meeker, PA-C  ?09/23/2021, 7:27 AM    ?

## 2021-09-23 NOTE — ED Notes (Signed)
24 hour urine collection was started 4/28 at 2300. ?

## 2021-09-23 NOTE — ED Notes (Signed)
Lunch tray provided to pt. Informed pt will bring to inpatient room once it is clean. Provided more water for pt. ?

## 2021-09-23 NOTE — ED Notes (Signed)
24 hour urine collection started at this time.

## 2021-09-23 NOTE — ED Notes (Signed)
Cardiology PA was at bedside talking with pt. ? ?Pt has 24 hour urine collection in process. Pt has been getting up to use hall bathroom and unhooking from monitor each time. Pt is here for unexplained syncopal episode. Explained to pt that instead of getting up to hall bathroom we will do BSN so show can stay on monitor. ? ?EKG will be performed now by ED tech. ?

## 2021-09-23 NOTE — Progress Notes (Signed)
?PROGRESS NOTE ? ? ? ?Khadedra Letsinger Tamashiro  EU:[id number] DOB: [birth year] DOA: 09/22/2021 ? ?PCP: Gwyneth Sprout, FNP  ? ?Brief Narrative:  ?This 76 years old female with PMH significant for hypertension, hyperlipidemia, depression, history of recurrent syncopal episode starting 02/2021.  Patient had cardiology evaluation with normal echo , Holter monitoring showing SVT.  Patient was seen by neurology and had an ambulatory EEG which was also normal,  no signs of any seizure activity noted.  Patient today came to the ED status post episode of syncope for further evaluation.  Patient was at [employer], she had syncopal episode. she reports feeling very weak and dizzy,  denies any chest pain or shortness of breath. ?Patient is admitted for syncopal episode.  She is found to have significantly elevated high blood pressure. ? ?Assessment & Plan: ?  ?Principal Problem: ?  Syncope ?Active Problems: ?  Syncope and collapse ? ?Recurrent syncope: ?Patient reports having recurrent syncopal episodes. ?Had out pt cardiology work-up .  Echocardiogram normal, Holter monitor showing SVT. ?EEG: shows no evidence of seizures. ?EKG showed some changes. ?Patient does have fluctuating blood pressures. ?Patient denies any chest pain shortness of breath nausea vomiting. ?Cardiology consulted, No need to repeat Echo. ?Unclear cause.  Differentials include vasovagal syncope, hypoglycemic episode versus arrhythmia. ?As per cardiology let blood pressure run high in 160 - 170. ? ?Hypoglycemia: ?Patient reports intermittent diarrhea with flushing episodes. ?Holter shows SVT, there was a concern about neuroendocrine tumor. ?Hemoglobin A1c 5.7, obtain urine for metanephrines. ? ?CKD stage IIIb: ?Serum creatinine at baseline ? ?Essential hypertension ?Continue lisinopril, atenolol ? ?Major depression: ?Continue Zoloft. ?  ?Hyperlipidemia ?Continue statins ? ?DVT prophylaxis:Lovenox ?Code Status: Full code. ?Family Communication: No family at bed  side. ?Disposition Plan: Status is: Inpatient ?Remains inpatient appropriate because:  further workup of recurrent syncope. ?  ? ?Consultants:  ?Cardiology ? ?Procedures:  ?Antimicrobials: None ? ? ?Subjective: ?Patient was seen and examined at bedside.  Overnight events noted.   ?Patient reports feeling better,  she reports she is having recurrent syncopal episodes for some time, ? ?Objective: ?Vitals:  ? 09/23/21 1200 09/23/21 1300 09/23/21 1345 09/23/21 1358  ?BP: (!) 180/95 138/70    ?Pulse: 72 76 72 70  ?Resp: (!) 26 (!) 29 (!) 24 16  ?Temp:      ?TempSrc:      ?SpO2: 100% 98% 98% 98%  ?Height:      ? ? ?Intake/Output Summary (Last 24 hours) at 09/23/2021 1435 ?Last data filed at 09/23/2021 0441 ?Gross per 24 hour  ?Intake 444.17 ml  ?Output --  ?Net 444.17 ml  ? ?There were no vitals filed for this visit. ? ?Examination: ? ?General exam: Appears comfortable, not in any acute distress.  Deconditioned ?Respiratory system: CTA bilaterally, no wheezing, no crackles, normal respiratory effort. ?Cardiovascular system: S1 & S2 heard, regular rate and rhythm, no murmur. ?Gastrointestinal system: Abdomen is soft, nontender, nondistended, BS+ ?Central nervous system: Alert and oriented. No focal neurological deficits. ?Extremities: No edema, no cyanosis, no clubbing. ?Skin: No rashes, lesions or ulcers ?Psychiatry: Judgement and insight appear normal. Mood & affect appropriate.  ? ? ? ?Data Reviewed: I have personally reviewed following labs and imaging studies ? ?CBC: ?Recent Labs  ?Lab 09/22/21 ?1152 09/22/21 ?1617  ?WBC 7.3 9.7  ?HGB 13.1 13.4  ?HCT 42.4 43.0  ?MCV 82.8 83.0  ?PLT 270 223  ? ?Basic Metabolic Panel: ?Recent Labs  ?Lab 09/22/21 ?1152 09/22/21 ?1617  ?NA 139  --   ?  K 3.7  --   ?CL 104  --   ?CO2 25  --   ?GLUCOSE 46*  --   ?BUN 31*  --   ?CREATININE 1.59* 1.53*  ?CALCIUM 9.3  --   ?MG  --  2.4  ? ?GFR: ?Estimated Creatinine Clearance: 33.1 mL/min (A) (by C-G formula based on SCr of 1.53 mg/dL (H)). ?Liver  Function Tests: ?No results for input(s): AST, ALT, ALKPHOS, BILITOT, PROT, ALBUMIN in the last 168 hours. ?No results for input(s): LIPASE, AMYLASE in the last 168 hours. ?No results for input(s): AMMONIA in the last 168 hours. ?Coagulation Profile: ?No results for input(s): INR, PROTIME in the last 168 hours. ?Cardiac Enzymes: ?No results for input(s): CKTOTAL, CKMB, CKMBINDEX, TROPONINI in the last 168 hours. ?BNP (last 3 results) ?No results for input(s): PROBNP in the last 8760 hours. ?HbA1C: ?Recent Labs  ?  09/22/21 ?1617  ?HGBA1C 5.6  ? ?CBG: ?Recent Labs  ?Lab 09/22/21 ?1303 09/22/21 ?1408 09/22/21 ?2031 09/23/21 ?[id number] 09/23/21 ?1201  ?GLUCAP 67* 83 76 101* 106*  ? ?Lipid Profile: ?No results for input(s): CHOL, HDL, LDLCALC, TRIG, CHOLHDL, LDLDIRECT in the last 72 hours. ?Thyroid Function Tests: ?Recent Labs  ?  09/22/21 ?1617  ?TSH 1.126  ? ?Anemia Panel: ?No results for input(s): VITAMINB12, FOLATE, FERRITIN, TIBC, IRON, RETICCTPCT in the last 72 hours. ?Sepsis Labs: ?No results for input(s): PROCALCITON, LATICACIDVEN in the last 168 hours. ? ?No results found for this or any previous visit (from the past 240 hour(s)).  ? ?Radiology Studies: ?X-ray chest PA and lateral ? ?Result Date: 09/22/2021 ?CLINICAL DATA:  Syncope. EXAM: CHEST - 2 VIEW COMPARISON:  Chest two views 06/27/2017 and 07/22/2015; CT chest 09/06/2017; CT abdomen and pelvis 05/03/2021 FINDINGS: Note is made of repair of markedly enlarged hiatal hernia seen in between the 09/06/2017 and 05/03/2021 prior studies. Cardiac silhouette and mediastinal contours are within normal limits. Moderate calcification within the aortic arch. Mild chronic interstitial thickening. No focal airspace opacity to indicate pneumonia. No pleural effusion or pneumothorax. Mild multilevel degenerative disc changes of the thoracic spine. Cholecystectomy clips. IMPRESSION: No active cardiopulmonary disease. Note is made of prior repair of large hiatal hernia seen on  06/27/2017 radiographs. Electronically Signed   By: Yvonne Kendall M.D.   On: 09/22/2021 15:28   ? ?Scheduled Meds: ? aspirin EC  81 mg Oral Daily  ? atenolol  50 mg Oral Daily  ? heparin  5,000 Units Subcutaneous Q8H  ? lisinopril  20 mg Oral BID  ? pantoprazole  40 mg Oral Daily  ? sodium chloride flush  3 mL Intravenous Q12H  ? ?Continuous Infusions: ? ? LOS: 1 day  ? ? ?Time spent: 50 mins ? ? ? ?Shawna Clamp, MD ?[facility] ? ? ?If 7PM-7AM, please contact night-coverage  ?

## 2021-09-24 DIAGNOSIS — R55 Syncope and collapse: Secondary | ICD-10-CM | POA: Diagnosis not present

## 2021-09-24 DIAGNOSIS — I10 Essential (primary) hypertension: Secondary | ICD-10-CM | POA: Diagnosis not present

## 2021-09-24 LAB — URINE CULTURE

## 2021-09-24 LAB — BASIC METABOLIC PANEL
Anion gap: 6 (ref 5–15)
BUN: 27 mg/dL — ABNORMAL HIGH (ref 8–23)
CO2: 26 mmol/L (ref 22–32)
Calcium: 8.6 mg/dL — ABNORMAL LOW (ref 8.9–10.3)
Chloride: 108 mmol/L (ref 98–111)
Creatinine, Ser: 1.3 mg/dL — ABNORMAL HIGH (ref 0.44–1.00)
GFR, Estimated: 43 mL/min — ABNORMAL LOW (ref >60)
Glucose, Bld: 114 mg/dL — ABNORMAL HIGH (ref 70–99)
Potassium: 3.9 mmol/L (ref 3.5–5.1)
Sodium: 140 mmol/L (ref 135–145)

## 2021-09-24 LAB — CBC
HCT: 38 % (ref 36.0–46.0)
Hemoglobin: 11.9 g/dL — ABNORMAL LOW (ref 12.0–15.0)
MCH: 25.8 pg — ABNORMAL LOW (ref 26.0–34.0)
MCHC: 31.3 g/dL (ref 30.0–36.0)
MCV: 82.4 fL (ref 80.0–100.0)
Platelets: 191 10*3/uL (ref 150–400)
RBC: 4.61 MIL/uL (ref 3.87–5.11)
RDW: 16.2 % — ABNORMAL HIGH (ref 11.5–15.5)
WBC: 7.5 10*3/uL (ref 4.0–10.5)
nRBC: 0 % (ref 0.0–0.2)

## 2021-09-24 LAB — GLUCOSE, CAPILLARY
Glucose-Capillary: 72 mg/dL (ref 70–99)
Glucose-Capillary: 101 mg/dL — ABNORMAL HIGH (ref 70–99)
Glucose-Capillary: 93 mg/dL (ref 70–99)
Glucose-Capillary: 115 mg/dL — ABNORMAL HIGH (ref 70–99)

## 2021-09-24 LAB — INSULIN AND C-PEPTIDE, SERUM
C-Peptide: 6.6 ng/mL — ABNORMAL HIGH (ref 1.1–4.4)
Insulin: 14.7 u[IU]/mL (ref 2.6–24.9)

## 2021-09-24 LAB — MAGNESIUM: Magnesium: 2.3 mg/dL (ref 1.7–2.4)

## 2021-09-24 LAB — TRYPTASE: Tryptase: 8.1 ug/L (ref 2.2–13.2)

## 2021-09-24 LAB — PHOSPHORUS: Phosphorus: 3.3 mg/dL (ref 2.5–4.6)

## 2021-09-24 MED ORDER — AMLODIPINE BESYLATE 5 MG PO TABS
5.0000 mg | ORAL_TABLET | Freq: Every day | ORAL | Status: DC
  Administered 2021-09-24: 5 mg via ORAL
  Filled 2021-09-24: qty 1

## 2021-09-24 MED ORDER — HYDRALAZINE HCL 50 MG PO TABS: 25.0000 mg | ORAL_TABLET | Freq: Three times a day (TID) | ORAL | Status: DC

## 2021-09-24 MED ORDER — HYDRALAZINE HCL 20 MG/ML IJ SOLN
10.0000 mg | Freq: Once | INTRAMUSCULAR | Status: DC
Start: 2021-09-24 — End: 2021-09-24
  Filled 2021-09-24: qty 1

## 2021-09-24 MED ORDER — AMLODIPINE BESYLATE 5 MG PO TABS: 5.0000 mg | ORAL_TABLET | Freq: Every day | ORAL | 0 refills | Status: DC

## 2021-09-24 NOTE — Discharge Summary (Signed)
Physician Discharge Summary  ?Jessica Little EU:[id number] DOB: [birth year] DOA: 09/22/2021 ? ?PCP: Gwyneth Sprout, FNP ? ?Admit date: 09/22/2021 ? ?Discharge date: 09/24/2021 ? ?Admitted From: Home. ? ?Disposition: Home. ? ?Recommendations for Outpatient Follow-up:  ?Follow up with PCP in 1-2 weeks. ?Please obtain BMP/CBC in one week. ?Advised to take amlodipine 5 mg daily for blood pressure control. ?Advised to continue atenolol and lisinopril as prescribed. ?Follow up urinary metanephrines. ? ?Home Health: None ?Equipment/Devices: None ? ?Discharge Condition: Good ?CODE STATUS:Full code ?Diet recommendation: Heart Healthy  ? ?Brief Summary / Hospital course: ?This 76 years old female with PMH significant for hypertension, hyperlipidemia, depression, history of recurrent syncopal episode started in 02/2021.  Patient had cardiology evaluation with normal echo , Holter monitoring showing SVT.  Patient was seen by neurology and had an ambulatory EEG which was also normal,  no signs of any seizure activity noted.  Patient presented to the ED status post episode of syncope for further evaluation.  Patient was at [employer], she had syncopal episode. she reports feeling very weak and dizzy,  denies any chest pain or shortness of breath. ?Patient was admitted for syncopal episode. She is found to have significantly elevated high blood pressure.  Patient was also hypoglycemic was given glucose.  Patient felt better, cardiology was consulted, recommended this could be secondary to orthostatic hypotension.  Recommended no further work-up.  Since patient has outpatient work-up which was completely unremarkable.  Cardiology recommended to keep blood pressure slightly upper range.  Patient had similar episode before discharge, her blood pressure was slightly elevated,  blood sugar was normal.  She reports diarrhea has improved.  She feels better and wantsto be discharged.  Patient is being discharged as cleared from  cardiology. ? ?Discharge Diagnoses:  ?Principal Problem: ?  Syncope ?Active Problems: ?  Syncope and collapse ? ?  ?Recurrent syncope: ?Patient reports having recurrent syncopal episodes. ?Had out pt cardiology work-up .  Echocardiogram normal, Holter monitor showing SVT. ?EEG: shows no evidence of seizures. ?EKG showed some changes. ?Patient does have fluctuating blood pressures. ?Patient denies any chest pain, shortness of breath,, nausea vomiting. ?Cardiology consulted, No need to repeat Echo. ?Unclear cause.  Differentials include vasovagal syncope, hypoglycemic episode versus arrhythmia. ?As per cardiology let blood pressure run high in 160 - 170. ?Cleared from cardio to be discharged. ?  ?Hypoglycemia: > Resolved. ?Patient reports intermittent diarrhea with flushing episodes. ?Holter shows SVT, there was a concern about neuroendocrine tumor. ?Hemoglobin A1c 5.7,  Follow up urine metanephrines. ?  ?CKD stage IIIb: ?Serum creatinine at baseline. ?  ?Essential hypertension ?Continue lisinopril, atenolol. ?  ?Major depression: ?Continue Zoloft. ?  ?Hyperlipidemia ?Continue statins ? ?Discharge Instructions ? ?Discharge Instructions   ? ? Call MD for:  difficulty breathing, headache or visual disturbances   Complete by: As directed ?  ? Call MD for:  persistant dizziness or light-headedness   Complete by: As directed ?  ? Call MD for:  persistant nausea and vomiting   Complete by: As directed ?  ? Diet - low sodium heart healthy   Complete by: As directed ?  ? Diet Carb Modified   Complete by: As directed ?  ? Discharge instructions   Complete by: As directed ?  ? Advised to follow-up with primary care physician in 1 week. ?Advised to take amlodipine 5 mg daily for blood pressure control. ?Advised to continue atenolol and lisinopril as prescribed.  ? Increase activity slowly   Complete by: As  directed ?  ? ?  ? ?Allergies as of 09/24/2021   ?No Known Allergies ?  ? ?  ?Medication List  ?  ? ?STOP taking these  medications   ? ?diazepam 5 MG tablet ?Commonly known as: Valium ?  ? ?  ? ?TAKE these medications   ? ?amLODipine 5 MG tablet ?Commonly known as: NORVASC ?Take 1 tablet (5 mg total) by mouth daily. ?Start taking on: Sep 25, 2021 ?  ?aspirin EC 81 MG tablet ?Take 81 mg by mouth daily. Swallow whole. ?  ?atenolol 50 MG tablet ?Commonly known as: TENORMIN ?Take 1 tablet by mouth once daily ?  ?Biotin 10000 MCG Tabs ?Take by mouth. ?  ?calcium carbonate 600 MG Tabs tablet ?Commonly known as: OS-CAL ?Take 600 mg by mouth daily with breakfast. ?  ?Flaxseed Oil 1000 MG Caps ?Take by mouth. ?  ?lisinopril 20 MG tablet ?Commonly known as: ZESTRIL ?Take 2 tablets (40 mg total) by mouth daily. ?What changed: Another medication with the same name was removed. Continue taking this medication, and follow the directions you see here. ?  ?Magnesium 400 MG Caps ?Take by mouth. ?  ?nortriptyline 50 MG capsule ?Commonly known as: PAMELOR ?Take 1 capsule (50 mg total) by mouth at bedtime. ?  ?omeprazole 40 MG capsule ?Commonly known as: PRILOSEC ?Take 1 capsule (40 mg total) by mouth 2 (two) times daily. ?  ?perphenazine 4 MG tablet ?Commonly known as: TRILAFON ?TAKE 1 TABLET BY MOUTH AT BEDTIME ?What changed:  ?how much to take ?how to take this ?when to take this ?  ?potassium chloride 10 MEQ tablet ?Commonly known as: KLOR-CON M ?Take 1 tablet by mouth once daily ?  ?simvastatin 40 MG tablet ?Commonly known as: ZOCOR ?Take 1 tablet (40 mg total) by mouth at bedtime. ?  ?VITAMIN D3 PO ?Take by mouth daily. ?  ?vitamin E 1000 UNIT capsule ?Take 1,000 Units by mouth daily. ?  ?ZINC 15 PO ?Take by mouth. ?  ? ?  ? ? ? Follow-up Information   ? ? Gwyneth Sprout, FNP Follow up in 1 week(s).   ?Specialty: Family Medicine ?Contact information: ?[address] ?[place] [address] ?[phone] ? ? ?  ?  ? ?  ?  ? ?  ? ?No Known Allergies ? ?Consultations: ?Cardiology ? ? ?Procedures/Studies: ?X-ray chest PA and lateral ? ?Result Date:  09/22/2021 ?CLINICAL DATA:  Syncope. EXAM: CHEST - 2 VIEW COMPARISON:  Chest two views 06/27/2017 and 07/22/2015; CT chest 09/06/2017; CT abdomen and pelvis 05/03/2021 FINDINGS: Note is made of repair of markedly enlarged hiatal hernia seen in between the 09/06/2017 and 05/03/2021 prior studies. Cardiac silhouette and mediastinal contours are within normal limits. Moderate calcification within the aortic arch. Mild chronic interstitial thickening. No focal airspace opacity to indicate pneumonia. No pleural effusion or pneumothorax. Mild multilevel degenerative disc changes of the thoracic spine. Cholecystectomy clips. IMPRESSION: No active cardiopulmonary disease. Note is made of prior repair of large hiatal hernia seen on 06/27/2017 radiographs. Electronically Signed   By: Yvonne Kendall M.D.   On: 09/22/2021 15:28  ? ?AMBULATORY EEG ? ?Result Date: 08/30/2021 ?Cameron Sprang, MD     09/20/2021  4:29 PM ELECTROENCEPHALOGRAM REPORT Dates of Recording: 08/30/2021 11:23AM to 08/31/2021 11:30AM Patient's Name: CHAPIN HALLY MRN: [id number] Date of Birth: [birth year] Referring Provider: Dr. Ellouise Newer Procedure: 24-hour ambulatory video EEG History: This is a 76 year old woman with an episode of loss of consciousness with shaking.  EEG for classification. Medications: TENORMIN 50 MG tablet Biotin 10000 MCG TABS OS-CAL 600 MG TABS tablet VITAMIN D3 PO FLAXSEED OIL ZESTRIL 10 MG tablet Magnesium 400 MG CAPS PAMELOR 50 MG capsuleProbiotic daily PRILOSEC 40 MG capsule TRILAFON 4 MG tablet KLOR-CON 10 MEQ tablet ZOCOR 40 MG tablet vitamin E 1000 UNIT capsule ZINC 15 PO Technical Summary: This is a 24-hour multichannel digital video EEG recording measured by the international 10-20 system with electrodes applied with paste and impedances below 5000 ohms performed as portable with EKG monitoring.  The digital EEG was referentially recorded, reformatted, and digitally filtered in a variety of bipolar and referential montages for  optimal display.  DESCRIPTION OF RECORDING: During maximal wakefulness, the background activity consisted of a symmetric 10 Hz posterior dominant rhythm which was reactive to eye opening.  There were no epileptiform discharges

## 2021-09-24 NOTE — Discharge Instructions (Signed)
Advised to follow-up with primary care physician in 1 week. ?Advised to take amlodipine 5 mg daily for blood pressure control. ?Advised to continue atenolol and lisinopril as prescribed. ?

## 2021-09-24 NOTE — TOC CM/SW Note (Signed)
?  Transition of Care (TOC) Screening Note ? ? ?Patient Details  ?Name: Jessica Little ?Date of Birth: 11-Oct-1945 ? ? ?Transition of Care (TOC) CM/SW Contact:    ?Marycruz Boehner E Kadin Bera, LCSW ?Phone Number: ?09/24/2021, 11:51 AM ? ? ? ?Transition of Care Department [facility]) has reviewed patient and no TOC needs have been identified at this time. We will continue to monitor patient advancement through interdisciplinary progression rounds. If new patient transition needs arise, please place a TOC consult. ? ? ?

## 2021-09-24 NOTE — Progress Notes (Signed)
?   09/24/21 1139  ?Assess: MEWS Score  ?Temp 98 ?F (36.7 ?C)  ?BP (!) 202/105 ?(MD aware)  ?Pulse Rate 74  ?Resp 20  ?Level of Consciousness Alert  ?SpO2 100 %  ?O2 Device Room Air  ?Assess: MEWS Score  ?MEWS Temp 0  ?MEWS Systolic 2  ?MEWS Pulse 0  ?MEWS RR 0  ?MEWS LOC 0  ?MEWS Score 2  ?MEWS Score Color Yellow  ?Assess: if the MEWS score is Yellow or Red  ?Were vital signs taken at a resting state? Yes  ?Focused Assessment No change from prior assessment  ?Does the patient meet 2 or more of the SIRS criteria? No  ?MEWS guidelines implemented *See Row Information* Yes  ?Treat  ?MEWS Interventions Escalated (See documentation below)  ?Pain Scale 0-10  ?Pain Score 0  ?Take Vital Signs  ?Increase Vital Sign Frequency  Yellow: Q 2hr X 2 then Q 4hr X 2, if remains yellow, continue Q 4hrs  ?Escalate  ?MEWS: Escalate Yellow: discuss with charge nurse/RN and consider discussing with provider and RRT  ?Notify: Charge Nurse/RN  ?Name of Charge Nurse/RN Notified Robyn, RN  ?Date Charge Nurse/RN Notified 09/24/21  ?Time Charge Nurse/RN Notified 1142  ?Notify: Provider  ?Provider Name/Title Lucianne Muss, MD  ?Date Provider Notified 09/24/21  ?Time Provider Notified 1130  ?Notification Type  ?(secure chat)  ?Notification Reason Other (Comment) ?(yellow mews d/t SBP)  ?Provider response Other (Comment) ?(aware)  ?Date of Provider Response 09/24/21  ?Time of Provider Response 1130  ?Assess: SIRS CRITERIA  ?SIRS Temperature  0  ?SIRS Pulse 0  ?SIRS Respirations  0  ?SIRS WBC 0  ?SIRS Score Sum  0  ? ?Patient pending discharge. Experienced episode similar to one that brought her in to ED. Patient in bed, call bell in hand. MD aware of above. Will continue to monitor ?

## 2021-09-24 NOTE — Progress Notes (Signed)
? ?Progress Note ? ?Patient Name: Jessica Little ?Date of Encounter: 09/24/2021 ? ?[facility] Cardiologist: Agbor-Etang ? ?Subjective  ? ?Doing okay, denies chest pain or shortness of breath. ? ?Inpatient Medications  ?  ?Scheduled Meds: ? amLODipine  5 mg Oral Daily  ? aspirin EC  81 mg Oral Daily  ? atenolol  50 mg Oral Daily  ? heparin  5,000 Units Subcutaneous Q8H  ? lisinopril  20 mg Oral BID  ? nortriptyline  50 mg Oral QHS  ? pantoprazole  40 mg Oral Daily  ? perphenazine  4 mg Oral QHS  ? sodium chloride flush  3 mL Intravenous Q12H  ? ?Continuous Infusions: ? ?PRN Meds: ?acetaminophen **OR** acetaminophen, ondansetron **OR** ondansetron (ZOFRAN) IV  ? ?Vital Signs  ?  ?Vitals:  ? 09/23/21 2119 09/24/21 0600 09/24/21 0603 09/24/21 0831  ?BP: (!) 180/101  (!) 196/81 (!) 156/101  ?Pulse: 68  70 69  ?Resp: 18  16 18   ?Temp:   97.8 ?F (36.6 ?C) 97.6 ?F (36.4 ?C)  ?TempSrc:      ?SpO2:   100% 99%  ?Weight:  72.5 kg    ?Height:      ? ?No intake or output data in the 24 hours ending 09/24/21 1025 ? ?  09/24/2021  ?  6:00 AM 09/11/2021  ?  2:02 PM 08/07/2021  ? 12:56 PM  ?Last 3 Weights  ?Weight (lbs) 159 lb 13.3 oz 156 lb 14.4 oz 159 lb  ?Weight (kg) 72.5 kg 71.169 kg 72.122 kg  ?   ? ?Telemetry  ?  ?Sinus rhythm heart rate 76, 3 of 4 beats run of VT- Personally Reviewed ? ?ECG  ?  ? - Personally Reviewed ? ?Physical Exam  ? ?GEN: No acute distress.   ?Neck: No JVD ?Cardiac: RRR, no murmurs, rubs, or gallops.  ?Respiratory: Clear to auscultation bilaterally. ?GI: Soft, nontender, non-distended  ?MS: No edema; No deformity. ?Neuro:  Nonfocal  ?Psych: Normal affect  ? ?Labs  ?  ?High Sensitivity Troponin:   ?Recent Labs  ?Lab 09/22/21 ?1152  ?TROPONINIHS 7  ?   ?Chemistry ?Recent Labs  ?Lab 09/22/21 ?1152 09/22/21 ?1617 09/24/21 ?0436  ?NA 139  --  140  ?K 3.7  --  3.9  ?CL 104  --  108  ?CO2 25  --  26  ?GLUCOSE 46*  --  114*  ?BUN 31*  --  27*  ?CREATININE 1.59* 1.53* 1.30*  ?CALCIUM 9.3  --  8.6*  ?MG  --  2.4  2.3  ?GFRNONAA 34* 36* 43*  ?ANIONGAP 10  --  6  ?  ?Lipids No results for input(s): CHOL, TRIG, HDL, LABVLDL, LDLCALC, CHOLHDL in the last 168 hours.  ?Hematology ?Recent Labs  ?Lab 09/22/21 ?1152 09/22/21 ?1617 09/24/21 ?0436  ?WBC 7.3 9.7 7.5  ?RBC 5.12* 5.18* 4.61  ?HGB 13.1 13.4 11.9*  ?HCT 42.4 43.0 38.0  ?MCV 82.8 83.0 82.4  ?MCH 25.6* 25.9* 25.8*  ?MCHC 30.9 31.2 31.3  ?RDW 15.9* 16.1* 16.2*  ?PLT 270 223 191  ? ?Thyroid  ?Recent Labs  ?Lab 09/22/21 ?1617  ?TSH 1.126  ?  ?BNPNo results for input(s): BNP, PROBNP in the last 168 hours.  ?DDimer No results for input(s): DDIMER in the last 168 hours.  ? ?Radiology  ?  ?X-ray chest PA and lateral ? ?Result Date: 09/22/2021 ?CLINICAL DATA:  Syncope. EXAM: CHEST - 2 VIEW COMPARISON:  Chest two views 06/27/2017 and 07/22/2015; CT chest 09/06/2017; CT  abdomen and pelvis 05/03/2021 FINDINGS: Note is made of repair of markedly enlarged hiatal hernia seen in between the 09/06/2017 and 05/03/2021 prior studies. Cardiac silhouette and mediastinal contours are within normal limits. Moderate calcification within the aortic arch. Mild chronic interstitial thickening. No focal airspace opacity to indicate pneumonia. No pleural effusion or pneumothorax. Mild multilevel degenerative disc changes of the thoracic spine. Cholecystectomy clips. IMPRESSION: No active cardiopulmonary disease. Note is made of prior repair of large hiatal hernia seen on 06/27/2017 radiographs. Electronically Signed   By: Yvonne Kendall M.D.   On: 09/22/2021 15:28   ? ?Cardiac Studies  ? ?TTE 04/2021 ? 1. Left ventricular ejection fraction, by estimation, is 60 to 65%. The  ?left ventricle has normal function. The left ventricle has no regional  ?wall motion abnormalities. There is mild left ventricular hypertrophy.  ?Left ventricular diastolic parameters  ?are consistent with Grade I diastolic dysfunction (impaired relaxation).  ?The average left ventricular global longitudinal strain is -16.4 %. The   ?global longitudinal strain is normal.  ? 2. Right ventricular systolic function is normal. The right ventricular  ?size is normal. There is normal pulmonary artery systolic pressure. The  ?estimated right ventricular systolic pressure is 0000000 mmHg.  ? 3. A small pericardial effusion is present.  ? 4. The mitral valve is normal in structure. Mild mitral valve  ?regurgitation. No evidence of mitral stenosis. ? ?Patient Profile  ?   ?76 y.o. female with history of hypertension presents hyperlipidemia, CKD presenting with syncopal episode being seen for syncope. ? ?Assessment & Plan  ?  ?1.  Syncope, orthostasis ?-Etiology appears noncardiac. vasovagal versus orthostasis likely. ?-Okay to let patient's blood pressure run high 160s 170s okay. ?-Follow-up as outpatient, will consider ILR ?-Advised on safety precautions and recommendation of warning signals ? ?  ?2.  Hypertension ?-BP elevated, systolic [id number] ?-Orthostasis ?-Okay to let BP run high 160s to 170s okay ?-Start amlodipine 5 mg daily ?-Continue lisinopril 40, atenolol 50 mg daily ?  ?No additional cardiac testing or intervention planned.  Okay for discharge.  Multiple episodes of syncope appear vasovagal.  Will consider ILR as outpatient.  ? ?Greater than 50% was spent in counseling and coordination of care with patient ?Total encounter time 50 minutes ?  ?   ?Signed, ?Kate Sable, MD  ?09/24/2021, 10:25 AM    ?

## 2021-09-25 ENCOUNTER — Telehealth: Payer: Self-pay

## 2021-09-25 NOTE — Telephone Encounter (Addendum)
Transition Care Management Unsuccessful Follow-up Telephone Call ? ?Date of discharge and from where:  TCM [facility] 09-24-21 Dx:  syncope ? ?Attempts:  1st Attempt ? ?Reason for unsuccessful TCM follow-up call:  Unable to leave message ? ?Transition Care Management Follow-up Telephone Call ?Date of discharge and from where: TCM DC [facility] 09-24-21 Dx:  syncope ?How have you been since you were released from the hospital? Doing good  ?Any questions or concerns? No ? ?Items Reviewed: ?Did the pt receive and understand the discharge instructions provided? Yes  ?Medications obtained and verified? Yes  ?Other? No  ?Any new allergies since your discharge? No  ?Dietary orders reviewed? Yes ?Do you have support at home? Yes  ? ?Home Care and Equipment/Supplies: ?Were home health services ordered? yes ?If so, what is the name of the agency? na  ?Has the agency set up a time to come to the patient's home? not applicable ?Were any new equipment or medical supplies ordered?  No ?What is the name of the medical supply agency? na ?Were you able to get the supplies/equipment? not applicable ?Do you have any questions related to the use of the equipment or supplies? No ? ?Functional Questionnaire: (I = Independent and D = Dependent) ?ADLs: I ? ?Bathing/Dressing- I ? ?Meal Prep- I ? ?Eating- I ? ?Maintaining continence- I ? ?Transferring/Ambulation- I ? ?Managing Meds- I ? ?Follow up appointments reviewed: ? ?PCP Hospital f/u appt confirmed? Yes  Scheduled to see Merita Norton FNP on 09-29-21 @ 1pm. ?Specialist Hospital f/u appt confirmed? No  . ?Are transportation arrangements needed? No  ?If their condition worsens, is the pt aware to call PCP or go to the Emergency Dept.? Yes ?Was the patient provided with contact information for the PCP's office or ED? Yes ?Was to pt encouraged to call back with questions or concerns? Yes  ? ?  ?

## 2021-09-28 ENCOUNTER — Encounter: Payer: Self-pay | Admitting: Cardiology

## 2021-09-28 ENCOUNTER — Ambulatory Visit: Payer: Medicare Other | Admitting: Cardiology

## 2021-09-28 VITALS — BP 120/70 | HR 86 | Ht 66.0 in | Wt 155.0 lb

## 2021-09-28 DIAGNOSIS — I4729 Other ventricular tachycardia: Secondary | ICD-10-CM | POA: Diagnosis not present

## 2021-09-28 DIAGNOSIS — I10 Essential (primary) hypertension: Secondary | ICD-10-CM | POA: Diagnosis not present

## 2021-09-28 DIAGNOSIS — R55 Syncope and collapse: Secondary | ICD-10-CM | POA: Diagnosis not present

## 2021-09-28 DIAGNOSIS — I6523 Occlusion and stenosis of bilateral carotid arteries: Secondary | ICD-10-CM

## 2021-09-28 MED ORDER — ATORVASTATIN CALCIUM 40 MG PO TABS: 40.0000 mg | ORAL_TABLET | Freq: Every day | ORAL | 3 refills | Status: DC

## 2021-09-28 NOTE — Progress Notes (Signed)
?Cardiology Office Note:   ? ?Date:  09/28/2021  ? ?ID:  Jessica Little, DOB 08-23-1945, MRN [id number] ? ?PCP:  Jacky Kindle, FNP ?  ?[facility] Providers ?Cardiologist:  None    ? ?Referring MD: Jacky Kindle, FNP  ? ?Chief Complaint  ?Patient presents with  ? Other  ?  Hospital follow up -- Meds reviewed verbally with patient.   ? ? ?History of Present Illness:   ? ?Jessica Little is a 76 y.o. female with a hx of hypertension, hyperlipidemia, CKD, syncope who presents for follow-up.   ? ?Recently seen in the hospital due to syncopal episodes, etiology seems vasovagal versus orthostasis as she usually has prodrome/warning signals including dizziness, fatigue, prior to passing out.Marland Kitchen  Has labile blood pressures, BP up to 190s.  Takes lisinopril, atenolol 50.  Amlodipine was started in the hospital prior to discharge.  Occasional nonsustained VT noted on cardiac monitor lasting 4-5 beats while admitted.  Ultrasound of the carotids obtained while admitted showed less than 50% stenosis. ? ?Prior notes ?Echo 04/2019 20 EF 60 to 65% ?Cardiac monitor 03/2021, paroxysmal SVT, nonsustained VT lasting up to 14 beats. ? ?Past Medical History:  ?Diagnosis Date  ? Anxiety   ? GERD (gastroesophageal reflux disease)   ? Hyperlipidemia   ? Hypertension   ? ? ?Past Surgical History:  ?Procedure Laterality Date  ? CATARACT EXTRACTION W/PHACO Left 01/18/2021  ? Procedure: CATARACT EXTRACTION PHACO AND INTRAOCULAR LENS PLACEMENT (IOC) LEFT 7.40 01:11.7;  Surgeon: Lockie Mola, MD;  Location: [facility];  Service: Ophthalmology;  Laterality: Left;  ? CATARACT EXTRACTION W/PHACO Right 02/01/2021  ? Procedure: CATARACT EXTRACTION PHACO AND INTRAOCULAR LENS PLACEMENT (IOC) RIGHT EYHANCE TORIC 8.60 01:09.9 ;  Surgeon: Lockie Mola, MD;  Location: [facility];  Service: Ophthalmology;  Laterality: Right;  ? CHOLECYSTECTOMY    ? COLONOSCOPY  01/01/2006  ? COLONOSCOPY WITH PROPOFOL N/A 08/23/2015  ?  Procedure: COLONOSCOPY WITH PROPOFOL;  Surgeon: Earline Mayotte, MD;  Location: [facility];  Service: Endoscopy;  Laterality: N/A;  ? HIATAL HERNIA REPAIR  2019  ? ? ?Current Medications: ?Current Meds  ?Medication Sig  ? amLODipine (NORVASC) 5 MG tablet Take 1 tablet (5 mg total) by mouth daily.  ? aspirin EC 81 MG tablet Take 81 mg by mouth daily. Swallow whole.  ? atenolol (TENORMIN) 50 MG tablet Take 1 tablet by mouth once daily  ? atorvastatin (LIPITOR) 40 MG tablet Take 1 tablet (40 mg total) by mouth daily.  ? Biotin 59563 MCG TABS Take by mouth.  ? calcium carbonate (OS-CAL) 600 MG TABS tablet Take 600 mg by mouth daily with breakfast.  ? Cholecalciferol (VITAMIN D3 PO) Take by mouth daily.  ? Flaxseed, Linseed, (FLAXSEED OIL) 1000 MG CAPS Take by mouth.  ? lisinopril (ZESTRIL) 20 MG tablet Take 2 tablets (40 mg total) by mouth daily.  ? Magnesium 400 MG CAPS Take by mouth.  ? nortriptyline (PAMELOR) 50 MG capsule Take 1 capsule (50 mg total) by mouth at bedtime.  ? omeprazole (PRILOSEC) 40 MG capsule Take 1 capsule (40 mg total) by mouth 2 (two) times daily.  ? perphenazine (TRILAFON) 4 MG tablet TAKE 1 TABLET BY MOUTH AT BEDTIME  ? potassium chloride (KLOR-CON) 10 MEQ tablet Take 1 tablet by mouth once daily  ? vitamin E 1000 UNIT capsule Take 1,000 Units by mouth daily.  ? Zinc Sulfate (ZINC 15 PO) Take by mouth.  ? [DISCONTINUED] simvastatin (ZOCOR) 40 MG  tablet Take 1 tablet (40 mg total) by mouth at bedtime.  ?  ? ?Allergies:   Patient has no known allergies.  ? ?Social History  ? ?Socioeconomic History  ? Marital status: Widowed  ?  Spouse name: Not on file  ? Number of children: 0  ? Years of education: College  ? Highest education level: Bachelor's degree (e.g., BA, AB, BS)  ?Occupational History  ? Occupation: Retired  ?Tobacco Use  ? Smoking status: Never  ? Smokeless tobacco: Never  ?Vaping Use  ? Vaping Use: Never used  ?Substance and Sexual Activity  ? Alcohol use: No  ? Drug use: No  ?  Sexual activity: Not on file  ?Other Topics Concern  ? Not on file  ?Social History Narrative  ? Right handed  ? No caffeine  ? One story home  ? ?Social Determinants of Health  ? ?Financial Resource Strain: Low Risk   ? Difficulty of Paying Living Expenses: Not hard at all  ?Food Insecurity: No Food Insecurity  ? Worried About Programme researcher, broadcasting/film/videounning Out of Food in the Last Year: Never true  ? Ran Out of Food in the Last Year: Never true  ?Transportation Needs: No Transportation Needs  ? Lack of Transportation (Medical): No  ? Lack of Transportation (Non-Medical): No  ?Physical Activity: Insufficiently Active  ? Days of Exercise per Week: 2 days  ? Minutes of Exercise per Session: 40 min  ?Stress: No Stress Concern Present  ? Feeling of Stress : Not at all  ?Social Connections: Moderately Isolated  ? Frequency of Communication with Friends and Family: Twice a week  ? Frequency of Social Gatherings with Friends and Family: Three times a week  ? Attends Religious Services: More than 4 times per year  ? Active Member of Clubs or Organizations: No  ? Attends BankerClub or Organization Meetings: Never  ? Marital Status: Widowed  ?  ? ?Family History: ?The patient's family history includes Arrhythmia in her mother; Healthy in her brother, sister, and sister; Heart attack in her father; Stroke in her father. There is no history of Breast cancer. ? ?ROS:   ?Please see the history of present illness.    ? All other systems reviewed and are negative. ? ?EKGs/Labs/Other Studies Reviewed:   ? ?The following studies were reviewed today: ? ? ?EKG:  EKG is  ordered today.  The ekg ordered 03/19/2021 demonstrates normal sinus rhythm. ? ?Recent Labs: ?09/11/2021: ALT 10 ?09/22/2021: TSH 1.126 ?09/24/2021: BUN 27; Creatinine, Ser 1.30; Hemoglobin 11.9; Magnesium 2.3; Platelets 191; Potassium 3.9; Sodium 140  ?Recent Lipid Panel ?   ?Component Value Date/Time  ? CHOL 176 08/03/2021 1502  ? TRIG 164 (H) 08/03/2021 1502  ? HDL 53 08/03/2021 1502  ? CHOLHDL 3.3  08/03/2021 1502  ? LDLCALC 95 08/03/2021 1502  ? ? ? ?Risk Assessment/Calculations:   ? ? ?    ? ?Physical Exam:   ? ?VS:  BP 120/70 (BP Location: Left Arm, Patient Position: Sitting, Cuff Size: Normal)   Pulse 86   Ht 5\' 6"  (1.676 m)   Wt 155 lb (70.3 kg)   BMI 25.02 kg/m?    ? ?Wt Readings from Last 3 Encounters:  ?09/28/21 155 lb (70.3 kg)  ?09/24/21 159 lb 13.3 oz (72.5 kg)  ?09/11/21 156 lb 14.4 oz (71.2 kg)  ?  ? ?GEN:  Well nourished, well developed in no acute distress ?HEENT: Normal ?NECK: No JVD; No carotid bruits ?CARDIAC: RRR, no murmurs, rubs, gallops ?RESPIRATORY:  Clear to auscultation without rales, wheezing or rhonchi  ?ABDOMEN: Soft, non-tender, non-distended ?MUSCULOSKELETAL:  No edema; No deformity  ?SKIN: Warm and dry ?NEUROLOGIC:  Alert and oriented x 3 ?PSYCHIATRIC:  Normal affect  ? ?ASSESSMENT:   ? ?1. Syncope and collapse   ?2. Primary hypertension   ?3. NSVT (nonsustained ventricular tachycardia) (HCC)   ?4. Bilateral carotid artery stenosis   ? ? ?PLAN:   ? ?In order of problems listed above: ? ?Syncope, appears vasovagal with prodrome of nausea, flushing.  Recent admission revealed some orthostasis.  Okay to let BP run high.  Echo with EF 60 to 65%, no gross structural abnormalities.  Prior cardiac monitor showed paroxysmal SVT and nonsustained VT lasting up to 14 beats.  Will refer patient to EP for ILR consideration.  Safety precautions/sitting when patient develops prodromes advised. ?Hypertension, BP slightly elevated, usually controlled.  Continue lisinopril 10 mg daily. ?NSVT, paroxysmal SVT.  Continue atenolol EP referral for ILR as above ?Nonobstructive carotid artery disease, less than 50% bilaterally.  Continue aspirin, stop simvastatin, start Lipitor 40 mg daily ? ?Follow-up 4 to 5 months ?  ? ?Medication Adjustments/Labs and Tests Ordered: ?Current medicines are reviewed at length with the patient today.  Concerns regarding medicines are outlined above.  ?Orders Placed  This Encounter  ?Procedures  ? Ambulatory referral to Cardiac Electrophysiology  ? Ambulatory referral to Vascular Surgery  ? EKG 12-Lead  ? ? ?Meds ordered this encounter  ?Medications  ? atorvastatin (LIPITOR) 4

## 2021-09-28 NOTE — Patient Instructions (Signed)
Medication Instructions:  ? ?Your physician has recommended you make the following change in your medication:  ? ? STOP taking your Simvastatin. ? ?2.    START taking Atorvastatin 40 MG once a day. ? ? ?*If you need a refill on your cardiac medications before your next appointment, please call your pharmacy* ? ? ?Lab Work: ? ?None ordered ? ?If you have labs (blood work) drawn today and your tests are completely normal, you will receive your results only by: ?MyChart Message (if you have MyChart) OR ?A paper copy in the mail ?If you have any lab test that is abnormal or we need to change your treatment, we will call you to review the results. ? ? ?Testing/Procedures: ? ?None ordered ? ? ?Follow-Up: ?At [facility], you and your health needs are our priority.  As part of our continuing mission to provide you with exceptional heart care, we have created designated Provider Care Teams.  These Care Teams include your primary Cardiologist (physician) and Advanced Practice Providers (APPs -  Physician Assistants and Nurse Practitioners) who all work together to provide you with the care you need, when you need it. ? ?We recommend signing up for the patient portal called "MyChart".  Sign up information is provided on this After Visit Summary.  MyChart is used to connect with patients for Virtual Visits (Telemedicine).  Patients are able to view lab/test results, encounter notes, upcoming appointments, etc.  Non-urgent messages can be sent to your provider as well.   ?To learn more about what you can do with MyChart, go to [email or web address].   ? ?Your next appointment:   ?4-5 week(s) ? ?The format for your next appointment:   ?In Person ? ?Provider:   ?You may see Debbe Odea, MD or one of the following Advanced Practice Providers on your designated Care Team:   ?Nicolasa Ducking, NP ?Eula Listen, PA-C ?Cadence Fransico Michael, PA-C ?  ? ? ?Other Instructions ? ? ?Important Information About Sugar ? ? ? ? ? ? ?

## 2021-09-29 ENCOUNTER — Ambulatory Visit (INDEPENDENT_AMBULATORY_CARE_PROVIDER_SITE_OTHER): Payer: Medicare Other | Admitting: Family Medicine

## 2021-09-29 ENCOUNTER — Encounter: Payer: Self-pay | Admitting: Family Medicine

## 2021-09-29 VITALS — BP 133/77 | HR 87 | Resp 16 | Wt 157.0 lb

## 2021-09-29 DIAGNOSIS — R569 Unspecified convulsions: Secondary | ICD-10-CM | POA: Diagnosis not present

## 2021-09-29 DIAGNOSIS — R55 Syncope and collapse: Secondary | ICD-10-CM | POA: Diagnosis not present

## 2021-09-29 DIAGNOSIS — I6523 Occlusion and stenosis of bilateral carotid arteries: Secondary | ICD-10-CM

## 2021-09-29 DIAGNOSIS — I10 Essential (primary) hypertension: Secondary | ICD-10-CM

## 2021-09-29 LAB — SEROTONIN SERUM: Serotonin, Serum: 61 ng/mL (ref 8–217)

## 2021-09-29 MED ORDER — ATENOLOL 50 MG PO TABS: 50.0000 mg | ORAL_TABLET | Freq: Every day | ORAL | 1 refills | Status: DC

## 2021-09-29 MED ORDER — ATORVASTATIN CALCIUM 40 MG PO TABS: 40.0000 mg | ORAL_TABLET | Freq: Every day | ORAL | 1 refills | Status: DC

## 2021-09-29 MED ORDER — AMLODIPINE BESYLATE 5 MG PO TABS: 5.0000 mg | ORAL_TABLET | Freq: Every day | ORAL | 1 refills | Status: DC

## 2021-09-29 NOTE — Assessment & Plan Note (Signed)
Chronic, stable in office ?Home Bps at home have been 170-210s SBP prior to medication ?Denies CP ?Denies SOB/ DOE ?Denies low blood pressure/hypotension ?Denies vision changes ?No LE Edema noted on exam ?Continue medication, Norvasc 5 mg, atenolol 50 mg, Lisinopril 40 mg,  ?Denies side effects ?RTC 6 wks ?Seek emergent care if you develop chest pain or chest pressure ? ?

## 2021-09-29 NOTE — Assessment & Plan Note (Signed)
02/2021 and 05/2021; has follow up appt with neuro ?Recently completed EEG, negative for seizure activity ?Has driving restriction until 11/2021 ?

## 2021-09-29 NOTE — Assessment & Plan Note (Signed)
Chronic, stable ?<50% in bilateral seen on [place] ?Has upcoming appt with vascular to establish for follow up going forward ?Per cards note- not likely the cause of syncope/collapse  ?

## 2021-09-29 NOTE — Assessment & Plan Note (Signed)
Patient with syncope/collapse at grocery store; admitted to hospital with addition of Norvasc 5 mg and change in cholesterol mediation at cardiology yesterday ? ?

## 2021-09-29 NOTE — Progress Notes (Signed)
?  ? ? ?Established patient visit ? ?I,April Miller,acting as a scribe for Jacky Kindle, FNP.,have documented all relevant documentation on the behalf of Jacky Kindle, FNP,as directed by  Jacky Kindle, FNP while in the presence of Jacky Kindle, FNP. ? ? ?Patient: Jessica Little   DOB: [birth year]   76 y.o. Female  MRN: [id number] ?Visit Date: 09/29/2021 ? ?Today's healthcare provider: Jacky Kindle, FNP  ?Re Introduced to nurse practitioner role and practice setting.  All questions answered.  Discussed provider/patient relationship and expectations. ? ? ?Chief Complaint  ?Patient presents with  ? Hospitalization Follow-up  ? ?Subjective  ?  ?HPI  ?Follow up Hospitalization ? ?Patient was admitted to [facility] on 09/22/2021 and discharged on 09/24/2021. ?She was treated for Syncope and collapse. ?Treatment for this included see notes in chart. ?Telephone follow up was done on 09/25/2021 ?She reports good compliance with treatment. ?She reports this condition is stayed the same. ? ?----------------------------------------------------------------------------------------- - ? ? ?Medications: ?Outpatient Medications Prior to Visit  ?Medication Sig  ? aspirin EC 81 MG tablet Take 81 mg by mouth daily. Swallow whole.  ? Biotin 03546 MCG TABS Take by mouth.  ? calcium carbonate (OS-CAL) 600 MG TABS tablet Take 600 mg by mouth daily with breakfast.  ? Cholecalciferol (VITAMIN D3 PO) Take by mouth daily.  ? Flaxseed, Linseed, (FLAXSEED OIL) 1000 MG CAPS Take by mouth.  ? lisinopril (ZESTRIL) 20 MG tablet Take 2 tablets (40 mg total) by mouth daily.  ? Magnesium 400 MG CAPS Take by mouth.  ? nortriptyline (PAMELOR) 50 MG capsule Take 1 capsule (50 mg total) by mouth at bedtime.  ? omeprazole (PRILOSEC) 40 MG capsule Take 1 capsule (40 mg total) by mouth 2 (two) times daily.  ? perphenazine (TRILAFON) 4 MG tablet TAKE 1 TABLET BY MOUTH AT BEDTIME  ? potassium chloride (KLOR-CON) 10 MEQ tablet Take 1 tablet by mouth once daily  ?  vitamin E 1000 UNIT capsule Take 1,000 Units by mouth daily.  ? Zinc Sulfate (ZINC 15 PO) Take by mouth.  ? [DISCONTINUED] amLODipine (NORVASC) 5 MG tablet Take 1 tablet (5 mg total) by mouth daily.  ? [DISCONTINUED] atenolol (TENORMIN) 50 MG tablet Take 1 tablet by mouth once daily  ? [DISCONTINUED] atorvastatin (LIPITOR) 40 MG tablet Take 1 tablet (40 mg total) by mouth daily.  ? ?No facility-administered medications prior to visit.  ? ? ?Review of Systems ? ? ?  Objective  ?  ?BP 133/77 (BP Location: Left Arm, Patient Position: Sitting, Cuff Size: Large)   Pulse 87   Resp 16   Wt 157 lb (71.2 kg)   SpO2 97%   BMI 25.34 kg/m?  ? ? ?Physical Exam ?Vitals and nursing note reviewed.  ?Constitutional:   ?   General: She is not in acute distress. ?   Appearance: Normal appearance. She is overweight. She is not ill-appearing, toxic-appearing or diaphoretic.  ?HENT:  ?   Head: Normocephalic and atraumatic.  ?Cardiovascular:  ?   Rate and Rhythm: Normal rate and regular rhythm.  ?   Pulses: Normal pulses.  ?   Heart sounds: Normal heart sounds. No murmur heard. ?  No friction rub. No gallop.  ?Pulmonary:  ?   Effort: Pulmonary effort is normal. No respiratory distress.  ?   Breath sounds: Normal breath sounds. No stridor. No wheezing, rhonchi or rales.  ?Chest:  ?   Chest wall: No tenderness.  ?Abdominal:  ?  General: Bowel sounds are normal.  ?   Palpations: Abdomen is soft.  ?Musculoskeletal:     ?   General: No swelling, tenderness, deformity or signs of injury. Normal range of motion.  ?   Right lower leg: No edema.  ?   Left lower leg: No edema.  ?Skin: ?   General: Skin is warm and dry.  ?   Capillary Refill: Capillary refill takes less than 2 seconds.  ?   Coloration: Skin is not jaundiced or pale.  ?   Findings: No bruising, erythema, lesion or rash.  ?Neurological:  ?   General: No focal deficit present.  ?   Mental Status: She is alert and oriented to person, place, and time. Mental status is at baseline.   ?   Cranial Nerves: No cranial nerve deficit.  ?   Sensory: No sensory deficit.  ?   Motor: No weakness.  ?   Coordination: Coordination normal.  ?Psychiatric:     ?   Mood and Affect: Mood normal.     ?   Behavior: Behavior normal.     ?   Thought Content: Thought content normal.     ?   Judgment: Judgment normal.  ?  ? ?No results found for any visits on 09/29/21. ? Assessment & Plan  ?  ? ?Problem List Items Addressed This Visit   ? ?  ? Cardiovascular and Mediastinum  ? Carotid stenosis, asymptomatic, bilateral  ?  Chronic, stable ?<50% in bilateral seen on [place] ?Has upcoming appt with vascular to establish for follow up going forward ?Per cards note- not likely the cause of syncope/collapse  ? ?  ?  ? Relevant Medications  ? amLODipine (NORVASC) 5 MG tablet  ? atenolol (TENORMIN) 50 MG tablet  ? atorvastatin (LIPITOR) 40 MG tablet  ? Primary hypertension  ?  Chronic, stable in office ?Home Bps at home have been 170-210s SBP prior to medication ?Denies CP ?Denies SOB/ DOE ?Denies low blood pressure/hypotension ?Denies vision changes ?No LE Edema noted on exam ?Continue medication, Norvasc 5 mg, atenolol 50 mg, Lisinopril 40 mg,  ?Denies side effects ?RTC 6 wks ?Seek emergent care if you develop chest pain or chest pressure ? ?  ?  ? Relevant Medications  ? amLODipine (NORVASC) 5 MG tablet  ? atenolol (TENORMIN) 50 MG tablet  ? atorvastatin (LIPITOR) 40 MG tablet  ?  ? Other  ? Seizure-like activity (HCC)  ?  02/2021 and 05/2021; has follow up appt with neuro ?Recently completed EEG, negative for seizure activity ?Has driving restriction until 11/2021 ? ?  ?  ? Vasovagal syncope - Primary  ?  Patient with syncope/collapse at grocery store; admitted to hospital with addition of Norvasc 5 mg and change in cholesterol mediation at cardiology yesterday ? ? ?  ?  ? ? ? ?Return in about 6 weeks (around 11/10/2021) for HTN management.  ?   ? ?I, Jacky Kindle, FNP, have reviewed all documentation for this visit. The  documentation on 09/29/21 for the exam, diagnosis, procedures, and orders are all accurate and complete. ? ? ? ?Jacky Kindle, FNP  ? Family Practice ?[phone] (phone) ?[phone] (fax) ? ?[facility] ?

## 2021-10-03 LAB — METANEPHRINES, URINE, 24 HOUR
Metaneph Total, Ur: 44 ug/L
Metanephrines, 24H Ur: 68 ug/24 hr (ref 36–209)
Normetanephrine, 24H Ur: 474 ug/24 hr (ref 131–612)
Normetanephrine, Ur: 306 ug/L

## 2021-10-05 ENCOUNTER — Ambulatory Visit: Payer: Self-pay

## 2021-10-05 ENCOUNTER — Ambulatory Visit: Payer: Self-pay | Admitting: *Deleted

## 2021-10-05 ENCOUNTER — Telehealth: Payer: Self-pay | Admitting: Psychiatry

## 2021-10-05 NOTE — Telephone Encounter (Signed)
Pt LVM at 2:32p.  She said she has been passing out and fainting.  Today, she couldn't make it to the car and she fell before she getting there.  She wanted to Dr Jennelle Human to know what was happening. ? ? ?Next appt 5/22 ? ?

## 2021-10-05 NOTE — Telephone Encounter (Signed)
Tried calling patient but no answer and no way to leave a message.  ?

## 2021-10-05 NOTE — Telephone Encounter (Signed)
? ? ?  Chief Complaint: Pt. Has fallen x 2 this week.Felt lightheaded before falls. ?Symptoms: [place] today .[place] forward on her knees. Denies injury. ?Frequency: This week. ?Pertinent Negatives: Patient denies any pain,injury. Did not hit head. ?Disposition: [] ED /[] Urgent Care (no appt availability in office) / [x] Appointment(In office/virtual)/ []  [facility]/ [] Home Care/ [] Refused Recommended Disposition /[] [facility]/ []  Follow-up with PCP ?Additional Notes: Instructed to call 911 for worsening of symptoms.  ?Reason for Disposition ? MILD weakness (i.e., does not interfere with ability to work, go to school, normal activities) (Exception: mild weakness is a chronic symptom) ? ?Answer Assessment - Initial Assessment Questions ?1. MECHANISM: "How did the fall happen?" ?    [place] outside her home ?2. DOMESTIC VIOLENCE AND ELDER ABUSE SCREENING: "Did you fall because someone pushed you or tried to hurt you?" If Yes, ask: "Are you safe now?" ?    No ?3. ONSET: "When did the fall happen?" (e.g., minutes, hours, or days ago) ?    Today ?4. LOCATION: "What part of the body hit the ground?" (e.g., back, buttocks, head, hips, knees, hands, head, stomach) ?    [place] forward on knees ?5. INJURY: "Did you hurt (injure) yourself when you fell?" If Yes, ask: "What did you injure? Tell me more about this?" (e.g., body area; type of injury; pain severity)" ?    No ?6. PAIN: "Is there any pain?" If Yes, ask: "How bad is the pain?" (e.g., Scale 1-10; or mild,  ?moderate, severe) ?  - NONE (0): No pain ?  - MILD (1-3): Doesn't interfere with normal activities  ?  - MODERATE (4-7): Interferes with normal activities or awakens from sleep  ?  - SEVERE (8-10): Excruciating pain, unable to do any normal activities  ?    No ?7. SIZE: For cuts, bruises, or swelling, ask: "How large is it?" (e.g., inches or centimeters)  ?    No ?8. PREGNANCY: "Is there any chance you are pregnant?" "When was your last menstrual  period?" ?    No ?9. OTHER SYMPTOMS: "Do you have any other symptoms?" (e.g., dizziness, fever, weakness; new onset or worsening).  ?    Lightheaded ?10. CAUSE: "What do you think caused the fall (or falling)?" (e.g., tripped, dizzy spell) ?      Maybe BP ? ?Protocols used: Falls and Falling-A-AH ? ?

## 2021-10-05 NOTE — Telephone Encounter (Signed)
FYI-on the schedule for tomorrow ?

## 2021-10-05 NOTE — Telephone Encounter (Addendum)
Before she was transferred to me from the agent the line disconnected.   She was calling in because she fell today.   Been having fainting episodes.   Recently in hospital for same.    ?I called her back but did not get an answer and no answering machine came on.  ? ?I forwarded this to [facility] as an FYI for Merita Norton, FNP.   ? ? ? ? ?

## 2021-10-06 ENCOUNTER — Ambulatory Visit (INDEPENDENT_AMBULATORY_CARE_PROVIDER_SITE_OTHER): Payer: Medicare Other | Admitting: Physician Assistant

## 2021-10-06 VITALS — Temp 97.6°F | Resp 17 | Ht 66.0 in | Wt 154.5 lb

## 2021-10-06 DIAGNOSIS — R296 Repeated falls: Secondary | ICD-10-CM | POA: Diagnosis not present

## 2021-10-06 DIAGNOSIS — I951 Orthostatic hypotension: Secondary | ICD-10-CM | POA: Diagnosis not present

## 2021-10-06 DIAGNOSIS — R42 Dizziness and giddiness: Secondary | ICD-10-CM | POA: Diagnosis not present

## 2021-10-06 NOTE — Assessment & Plan Note (Signed)
Recurrent concern ?Suspect likely related to orthostatic BP changes  ?CBC and CMP for rule out  ?Reviewed ways to improve volume status and improving safety with position changes ?Follow up as needed for persistent or progressing symptoms  ?

## 2021-10-06 NOTE — Progress Notes (Signed)
?  ? ? ?Established patient visit ? ? ?Patient: Jessica BurrowRhoda M Little   DOB: 07/28/1945   76 y.o. Female  MRN: [id number] ?Visit Date: 10/06/2021 ? ?Today's healthcare provider: Oswaldo ConroyErin E Dortha Neighbors, PA-C  ?Introduced myself to the patient as a Secondary school teacherA-C and provided education on APPs in clinical practice.  ? ? ?I,Tiffany J Bragg,acting as a Neurosurgeonscribe for [employer] CorporationErin E Cabrina Shiroma, PA-C.,have documented all relevant documentation on the behalf of Markail Diekman E Latarra Eagleton, PA-C,as directed by  [employer]rin E Renessa Wellnitz, PA-C while in the presence of Elizabethanne Lusher E Isaac Dubie, PA-C.  ? ?Chief Complaint  ?Patient presents with  ? Dizziness  ? ?Subjective  ?  ?HPI  ?Dizziness ? ?She reports  intermittent  dizziness. She describes it as feeling light headed, occurs intermittently, and typically lasts less than 5 minutes.  It typically occurs when she is standing up from siting position. It is usually relieved by sitting still. She has not started new medications around the time the dizziness started.  ? ?Associated symptoms: ?No hearing loss No tinnitus  ?No chest discomfort No heart palpitations  ?No heart racing No numbness or tingling of extremities  ?No nausea No vomiting  ?No speech difficulty No visual changes  ? ? ?Wt Readings from Last 3 Encounters:  ?10/06/21 154 lb 8 oz (70.1 kg)  ?09/29/21 157 lb (71.2 kg)  ?09/28/21 155 lb (70.3 kg)  ?  BP Readings from Last 3 Encounters:  ?09/29/21 133/77  ?09/28/21 120/70  ?09/24/21 (!) 164/88  ?   ? ?Lab Results  ?Component Value Date  ? WBC 7.5 09/24/2021  ? HGB 11.9 (L) 09/24/2021  ? HCT 38.0 09/24/2021  ? MCV 82.4 09/24/2021  ? PLT 191 09/24/2021  ? Lab Results  ?Component Value Date  ? NA 140 09/24/2021  ? K 3.9 09/24/2021  ? CO2 26 09/24/2021  ? BUN 27 (H) 09/24/2021  ? CREATININE 1.30 (H) 09/24/2021  ? CALCIUM 8.6 (L) 09/24/2021  ? GLUCOSE 114 (H) 09/24/2021  ?  ? ?---------------------------------------------------------------------------------------------------  ? ?States she was going to the store yesterday and fell ?States she did not  injure her head  ? ?States this happened Monday as well - got up to answer door and fell backwards ?States these past few times she has not lost consciousness but this has happened in the past  ?Report multiple episodes of syncope last year ?Reports she feels weak after these falling episodes ? ?Reports she is trying to drink about 64 oz of water every day but she struggles  ?Diet: reports she is eating a lot of sweet potatoes to help with her potassium. She is eating 2-3 meals per day but she trying to include protein bars as snacks. Reports her meals are typically comprised of meat and a vegetable  ? ? ? ?Medications: ?Outpatient Medications Prior to Visit  ?Medication Sig  ? amLODipine (NORVASC) 5 MG tablet Take 1 tablet (5 mg total) by mouth daily.  ? aspirin EC 81 MG tablet Take 81 mg by mouth daily. Swallow whole.  ? atenolol (TENORMIN) 50 MG tablet Take 1 tablet (50 mg total) by mouth daily.  ? atorvastatin (LIPITOR) 40 MG tablet Take 1 tablet (40 mg total) by mouth daily.  ? Biotin [id number] MCG TABS Take by mouth.  ? calcium carbonate (OS-CAL) 600 MG TABS tablet Take 600 mg by mouth daily with breakfast.  ? Cholecalciferol (VITAMIN D3 PO) Take by mouth daily.  ? Flaxseed, Linseed, (FLAXSEED OIL) 1000 MG CAPS Take by mouth.  ?  lisinopril (ZESTRIL) 20 MG tablet Take 2 tablets (40 mg total) by mouth daily.  ? Magnesium 400 MG CAPS Take by mouth.  ? nortriptyline (PAMELOR) 50 MG capsule Take 1 capsule (50 mg total) by mouth at bedtime.  ? omeprazole (PRILOSEC) 40 MG capsule Take 1 capsule (40 mg total) by mouth 2 (two) times daily.  ? perphenazine (TRILAFON) 4 MG tablet TAKE 1 TABLET BY MOUTH AT BEDTIME  ? potassium chloride (KLOR-CON) 10 MEQ tablet Take 1 tablet by mouth once daily  ? vitamin E 1000 UNIT capsule Take 1,000 Units by mouth daily.  ? Zinc Sulfate (ZINC 15 PO) Take by mouth.  ? ?No facility-administered medications prior to visit.  ? ? ?Review of Systems  ?Respiratory:  Negative for chest tightness  and shortness of breath.   ?Cardiovascular:  Negative for chest pain, palpitations and leg swelling.  ?Gastrointestinal:  Negative for diarrhea, nausea and vomiting.  ?Neurological:  Positive for syncope, weakness and light-headedness. Negative for dizziness and tremors.  ? ? ?  Objective  ?  ?Temp 97.6 ?F (36.4 ?C) (Oral)   Resp 17   Ht 5\' 6"  (1.676 m)   Wt 154 lb 8 oz (70.1 kg)   SpO2 100%   BMI 24.94 kg/m?  ? ? ?Orthostatic BP 126/80 79/58  ? ?Orthostatic BP 126/80 79/58  ?BP Location Right Arm Right Arm  ?Patient Position Sitting Standing  ?Cuff Size Normal Normal  ? ? ?Physical Exam ?Vitals reviewed.  ?Constitutional:   ?   General: She is awake.  ?   Appearance: Normal appearance. She is well-developed, well-groomed and normal weight.  ?HENT:  ?   Head: Normocephalic and atraumatic.  ?Eyes:  ?   Extraocular Movements: Extraocular movements intact.  ?   Conjunctiva/sclera: Conjunctivae normal.  ?   Pupils: Pupils are equal, round, and reactive to light.  ?Cardiovascular:  ?   Rate and Rhythm: Regular rhythm. Bradycardia present.  ?   Pulses: Normal pulses.  ?   Heart sounds: Normal heart sounds.  ?   Comments: Patient stated she felt lightheaded during leaning forward  ?Pulmonary:  ?   Effort: Pulmonary effort is normal.  ?   Breath sounds: Normal breath sounds and air entry. No decreased breath sounds, wheezing, rhonchi or rales.  ?   Comments: Intermittent increase breathing effort throughout apt as she was discussing symptoms ?Musculoskeletal:  ?   Cervical back: Normal range of motion.  ?   Right lower leg: No edema.  ?   Left lower leg: No edema.  ?Neurological:  ?   Mental Status: She is alert.  ?Psychiatric:     ?   Attention and Perception: Attention and perception normal.     ?   Mood and Affect: Mood and affect normal.     ?   Speech: Speech normal.     ?   Behavior: Behavior normal. Behavior is cooperative.  ?  ? ? ?No results found for any visits on 10/06/21. ? Assessment & Plan  ?  ? ?Problem  List Items Addressed This Visit   ? ?  ? Cardiovascular and Mediastinum  ? Orthostatic hypotension - Primary  ?  Acute, new problem ?Orthostatics today indicate significant BP changes with position adjustments ?Recommend she focus on improving fluid volumes and eating regularly  ?She is taking Norvasc 5 mg, Atenolol 50 mg and lisinopril 20 mg to control HTN - may need adjustments to this if orthostatics remain an issue ?Will check CBC  and CMP for signs of anemia or electrolyte abnormality that could be causing symptoms ?Will place referral to PT for assistance with position changes ad fall prevention  ?Follow up as needed for persistent or progressing symptoms  ? ?  ?  ? Relevant Orders  ? Ambulatory referral to Physical Therapy  ?  ? Other  ? Dizziness  ?  Recurrent concern ?Suspect likely related to orthostatic BP changes  ?CBC and CMP for rule out  ?Reviewed ways to improve volume status and improving safety with position changes ?Follow up as needed for persistent or progressing symptoms  ? ?  ?  ? Relevant Orders  ? CBC w/Diff/Platelet  ? Comprehensive Metabolic Panel (CMET)  ? Frequent falls  ?  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 ?  ?  ? Relevant Orders  ? Ambulatory referral to Physical Therapy  ? ? ? ?No follow-ups on file. ? ? ?I, Trena Dunavan E Doretha Goding, PA-C, have reviewed all documentation for this visit. The documentation on 10/06/21 for the exam, diagnosis, procedures, and orders are all accurate and  complete. ? ? ?Decarlos Empey, MHS, PA-C ?[facility] ?[facility]  ? ? ?

## 2021-10-06 NOTE — Telephone Encounter (Signed)
FYI  ? ?See message from patient. She was hospitalized at [facility] 4/28-4/30 for these issues, but in looking in her records this is not a new issue for her. A friend mentioned to her that she thought she might need to restart her meds for schizophrenia. She has an appt with you on 5/22 and was not asking for anything at this time, just wanted you to be aware.  ?

## 2021-10-06 NOTE — Assessment & Plan Note (Signed)
Acute, new problem ?Orthostatics today indicate significant BP changes with position adjustments ?Recommend she focus on improving fluid volumes and eating regularly  ?She is taking Norvasc 5 mg, Atenolol 50 mg and lisinopril 20 mg to control HTN - may need adjustments to this if orthostatics remain an issue ?Will check CBC and CMP for signs of anemia or electrolyte abnormality that could be causing symptoms ?Will place referral to PT for assistance with position changes ad fall prevention  ?Follow up as needed for persistent or progressing symptoms  ?

## 2021-10-06 NOTE — Patient Instructions (Addendum)
For your lightheadedness I recommend the following ?Make position changes slowly and deliberately- try to use a walker or chair to assist you while you stand up to help steady yourself until the lightheadedness is reduced ?Stay well hydrated - at least 64 oz of water per day. Can supplement with pedialyte or low sugar juice ?Eat frequent meals and snacks  ?You may need to look into a life alert or fall alert device to assist with any falls that may occur ?I would like to send you to physical therapy to assist with fall prevention strengthening  ?The referral team should call you soon to help set up an apt ? ?Keep your upcoming appointments and continue with your current medications  ?

## 2021-10-06 NOTE — Assessment & Plan Note (Signed)
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

## 2021-10-07 LAB — COMPREHENSIVE METABOLIC PANEL
ALT: 16 IU/L (ref 0–32)
AST: 24 IU/L (ref 0–40)
Albumin/Globulin Ratio: 1.7 (ref 1.2–2.2)
Albumin: 4.5 g/dL (ref 3.7–4.7)
Alkaline Phosphatase: 83 IU/L (ref 44–121)
BUN/Creatinine Ratio: 23 (ref 12–28)
BUN: 33 mg/dL — ABNORMAL HIGH (ref 8–27)
Bilirubin Total: 0.5 mg/dL (ref 0.0–1.2)
CO2: 19 mmol/L — ABNORMAL LOW (ref 20–29)
Calcium: 9.5 mg/dL (ref 8.7–10.3)
Chloride: 105 mmol/L (ref 96–106)
Creatinine, Ser: 1.46 mg/dL — ABNORMAL HIGH (ref 0.57–1.00)
Globulin, Total: 2.7 g/dL (ref 1.5–4.5)
Glucose: 82 mg/dL (ref 70–99)
Potassium: 4.5 mmol/L (ref 3.5–5.2)
Sodium: 140 mmol/L (ref 134–144)
Total Protein: 7.2 g/dL (ref 6.0–8.5)
eGFR: 38 mL/min/{1.73_m2} — ABNORMAL LOW (ref >59)

## 2021-10-09 ENCOUNTER — Other Ambulatory Visit: Payer: Self-pay | Admitting: Physician Assistant

## 2021-10-09 DIAGNOSIS — R42 Dizziness and giddiness: Secondary | ICD-10-CM | POA: Diagnosis not present

## 2021-10-10 LAB — CBC WITH DIFFERENTIAL/PLATELET
Basophils Absolute: 0.1 10*3/uL (ref 0.0–0.2)
Basos: 1 %
EOS (ABSOLUTE): 0.1 10*3/uL (ref 0.0–0.4)
Eos: 1 %
Hematocrit: 37.5 % (ref 34.0–46.6)
Hemoglobin: 12.2 g/dL (ref 11.1–15.9)
Immature Grans (Abs): 0 10*3/uL (ref 0.0–0.1)
Immature Granulocytes: 0 %
Lymphocytes Absolute: 2.6 10*3/uL (ref 0.7–3.1)
Lymphs: 35 %
MCH: 26.5 pg — ABNORMAL LOW (ref 26.6–33.0)
MCHC: 32.5 g/dL (ref 31.5–35.7)
MCV: 81 fL (ref 79–97)
Monocytes Absolute: 0.5 10*3/uL (ref 0.1–0.9)
Monocytes: 7 %
Neutrophils Absolute: 4 10*3/uL (ref 1.4–7.0)
Neutrophils: 56 %
Platelets: 277 10*3/uL (ref 150–450)
RBC: 4.61 x10E6/uL (ref 3.77–5.28)
RDW: 15.5 % — ABNORMAL HIGH (ref 11.7–15.4)
WBC: 7.3 10*3/uL (ref 3.4–10.8)

## 2021-10-10 NOTE — Progress Notes (Signed)
Spoke with patient about lab results, she states her understanding. TB ?

## 2021-10-12 ENCOUNTER — Other Ambulatory Visit: Payer: Self-pay | Admitting: Family Medicine

## 2021-10-12 DIAGNOSIS — E876 Hypokalemia: Secondary | ICD-10-CM

## 2021-10-12 DIAGNOSIS — R8290 Unspecified abnormal findings in urine: Secondary | ICD-10-CM | POA: Diagnosis not present

## 2021-10-12 DIAGNOSIS — E785 Hyperlipidemia, unspecified: Secondary | ICD-10-CM | POA: Diagnosis not present

## 2021-10-12 DIAGNOSIS — K219 Gastro-esophageal reflux disease without esophagitis: Secondary | ICD-10-CM | POA: Diagnosis not present

## 2021-10-12 DIAGNOSIS — I10 Essential (primary) hypertension: Secondary | ICD-10-CM | POA: Diagnosis not present

## 2021-10-12 NOTE — Telephone Encounter (Signed)
Requested Prescriptions  Pending Prescriptions Disp Refills  . potassium chloride (KLOR-CON M) 10 MEQ tablet [Pharmacy Med Name: Potassium Chloride Crys ER 10 MEQ Oral Tablet Extended Release] 90 tablet 0    Sig: Take 1 tablet by mouth once daily     Endocrinology:  Minerals - Potassium Supplementation Failed - 10/12/2021  9:28 AM      Failed - Cr in normal range and within 360 days    Creatinine  Date Value Ref Range Status  04/30/2014 1.75 (H) 0.60 - 1.30 mg/dL Final   Creatinine, Ser  Date Value Ref Range Status  10/06/2021 1.46 (H) 0.57 - 1.00 mg/dL Final         Passed - K in normal range and within 360 days    Potassium  Date Value Ref Range Status  10/06/2021 4.5 3.5 - 5.2 mmol/L Final  04/30/2014 3.3 (L) 3.5 - 5.1 mmol/L Final         Passed - Valid encounter within last 12 months    Recent Outpatient Visits          6 days ago Orthostatic hypotension   [facility], Oswaldo Conroy, PA-C   1 week ago Vasovagal syncope   [facility] Jacky Kindle, FNP   1 month ago Primary hypertension   [facility] Jacky Kindle, FNP   2 months ago Primary hypertension   [facility] Jacky Kindle, FNP   3 months ago Primary hypertension   [facility], Marzella Schlein, MD      Future Appointments            In 3 weeks Stoioff, Verna Czech, MD [facility]   In 3 weeks Agbor-Etang, Arlys John, MD [facility]   In 3 weeks Jacky Kindle, FNP [facility]   In 4 months Suzie Portela, Daryl Eastern, FNP [facility], [facility]

## 2021-10-13 ENCOUNTER — Other Ambulatory Visit: Payer: Self-pay | Admitting: Nephrology

## 2021-10-13 ENCOUNTER — Other Ambulatory Visit (HOSPITAL_COMMUNITY): Payer: Self-pay | Admitting: Nephrology

## 2021-10-13 DIAGNOSIS — N1832 Chronic kidney disease, stage 3b: Secondary | ICD-10-CM

## 2021-10-13 DIAGNOSIS — R8290 Unspecified abnormal findings in urine: Secondary | ICD-10-CM

## 2021-10-13 DIAGNOSIS — R809 Proteinuria, unspecified: Secondary | ICD-10-CM

## 2021-10-13 DIAGNOSIS — N281 Cyst of kidney, acquired: Secondary | ICD-10-CM

## 2021-10-13 DIAGNOSIS — I10 Essential (primary) hypertension: Secondary | ICD-10-CM

## 2021-10-13 DIAGNOSIS — E785 Hyperlipidemia, unspecified: Secondary | ICD-10-CM

## 2021-10-16 ENCOUNTER — Encounter: Payer: Self-pay | Admitting: Psychiatry

## 2021-10-16 ENCOUNTER — Ambulatory Visit (INDEPENDENT_AMBULATORY_CARE_PROVIDER_SITE_OTHER): Payer: Medicare Other | Admitting: Psychiatry

## 2021-10-16 DIAGNOSIS — F4001 Agoraphobia with panic disorder: Secondary | ICD-10-CM

## 2021-10-16 DIAGNOSIS — F411 Generalized anxiety disorder: Secondary | ICD-10-CM | POA: Diagnosis not present

## 2021-10-16 DIAGNOSIS — F3342 Major depressive disorder, recurrent, in full remission: Secondary | ICD-10-CM

## 2021-10-16 DIAGNOSIS — I951 Orthostatic hypotension: Secondary | ICD-10-CM

## 2021-10-16 DIAGNOSIS — F251 Schizoaffective disorder, depressive type: Secondary | ICD-10-CM

## 2021-10-16 MED ORDER — PERPHENAZINE 4 MG PO TABS: ORAL_TABLET | ORAL | 3 refills | Status: DC

## 2021-10-16 MED ORDER — NORTRIPTYLINE HCL 50 MG PO CAPS: 50.0000 mg | ORAL_CAPSULE | Freq: Every day | ORAL | 3 refills | Status: DC

## 2021-10-16 NOTE — Progress Notes (Signed)
Jessica Little [id number] Feb 21, 1946 76 y.o.  Subjective:   Patient ID:  Jessica Little is a 76 y.o. (DOB August 26, 1945) female.  Chief Complaint:  Chief Complaint  Patient presents with   Follow-up    Depression and anxiety   Stress    Medical problems including syncope    HPI Jessica Little presents to the office today for follow-up of depression with psychotic features.  Hosp April 2019 for 4 weeks after hernia surgery and then had agitated delirium.  Per Dr. Okey Dupre psychiatry at [facility] she also had a prolonged QTC of 593.  She was discharged from the hospital on nortriptyline 50 and perphenazine 4 mg nightly and lorazepam 1 mg twice daily .   then rehab.  A lot of med changes. Came off lorazepam in the hospital but doesn't remember it.  No psychosis off the meds.  Weaned off perphenazine too and on less nortriptyline and doing ok.  seen January 2020 and 06/22/19.  No meds were changed.  Has remained on nortriptyline 50 HS and perphenazine 4 HS. She was doing well at the time.  07/04/2020 appointment with the following noted: Sister came to see her in August and thinking about moving back to [place] to be with sisters and brother.  Maybe in the summer.    Doing great.  God's healed me of panic.  No depression.  Patient reports stable mood and denies depressed or irritable moods.  Patient denies any recent difficulty with anxiety.  Patient reports variable difficulty with sleep initiation or maintenance. No trazodone anymore   Denies appetite disturbance.  Patient reports that energy and motivation have been good.  Patient denies any difficulty with concentration.  Patient denies any suicidal ideation. Plan: Doing well with only perphenazine 4 HS Nortriptyline 50 HS. Off lorazepam for a couple of years No med changes indicated. High relapse risk without it.  10/16/2021 appointment with the following noted: Had planned to move back to [place] but hasn't yet. Orthostatic hypotension by  her report and has been seen recently. depression and anxiety are not a problem at all.  Patient reports stable mood and denies depressed or irritable moods.  Patient denies any recent difficulty with anxiety.  . Denies appetite disturbance.  Patient reports that energy and motivation have been good.  Patient denies any difficulty with concentration.  Patient denies any suicidal ideation. Occ EMA several days per week.  No sig napping usually unless it's brief.  Most of the time is fine. No problems with psych meds. On waiting list for [facility] in [place].  Past Psychiatric Medication Trials: Nortriptyline 100,  perphenazine 16 mg daily but mostly 8 mg over the last 10 years,  Geodon 160, olanzapine 20, quetiapine 300, Haldol 15 lorazepam 1 mg 4 times daily, atenolol,  Under the care of [facility] since July 1996  Review of Systems:  Review of Systems  Respiratory:  Negative for chest tightness.   Gastrointestinal:  Negative for abdominal distention.  Musculoskeletal:  Positive for gait problem.  Neurological:  Positive for tremors and light-headedness. Negative for weakness.  Psychiatric/Behavioral:  Negative for agitation, behavioral problems, confusion, decreased concentration, dysphoric mood, hallucinations, self-injury, sleep disturbance and suicidal ideas. The patient is nervous/anxious. The patient is not hyperactive.    Medications: I have reviewed the patient's current medications.  Current Outpatient Medications  Medication Sig Dispense Refill   amLODipine (NORVASC) 5 MG tablet Take 1 tablet (5 mg total) by mouth daily. 90 tablet  1   aspirin EC 81 MG tablet Take 81 mg by mouth daily. Swallow whole.     atenolol (TENORMIN) 50 MG tablet Take 1 tablet (50 mg total) by mouth daily. 90 tablet 1   atorvastatin (LIPITOR) 40 MG tablet Take 1 tablet (40 mg total) by mouth daily. 90 tablet 1   Biotin 10000 MCG TABS Take by mouth.     calcium carbonate (OS-CAL) 600 MG  TABS tablet Take 600 mg by mouth daily with breakfast.     Cholecalciferol (VITAMIN D3 PO) Take by mouth daily.     Flaxseed, Linseed, (FLAXSEED OIL) 1000 MG CAPS Take by mouth.     lisinopril (ZESTRIL) 20 MG tablet Take 2 tablets (40 mg total) by mouth daily. 180 tablet 1   Magnesium 400 MG CAPS Take by mouth.     omeprazole (PRILOSEC) 40 MG capsule Take 1 capsule (40 mg total) by mouth 2 (two) times daily. 180 capsule 1   potassium chloride (KLOR-CON M) 10 MEQ tablet Take 1 tablet by mouth once daily 90 tablet 0   vitamin E 1000 UNIT capsule Take 1,000 Units by mouth daily.     Zinc Sulfate (ZINC 15 PO) Take by mouth.     nortriptyline (PAMELOR) 50 MG capsule Take 1 capsule (50 mg total) by mouth at bedtime. 90 capsule 3   perphenazine (TRILAFON) 4 MG tablet TAKE 1 TABLET BY MOUTH AT BEDTIME 90 tablet 3   No current facility-administered medications for this visit.    Medication Side Effects: none  Allergies: No Known Allergies  Past Medical History:  Diagnosis Date   Anxiety    GERD (gastroesophageal reflux disease)    Hyperlipidemia    Hypertension     Family History  Problem Relation Age of Onset   Arrhythmia Mother    Heart attack Father    Stroke Father    Healthy Sister    Healthy Brother    Healthy Sister    Breast cancer Neg Hx     Social History   Socioeconomic History   Marital status: Widowed    Spouse name: Not on file   Number of children: 0   Years of education: College   Highest education level: Bachelor's degree (e.g., BA, AB, BS)  Occupational History   Occupation: Retired  Tobacco Use   Smoking status: Never   Smokeless tobacco: Never  Vaping Use   Vaping Use: Never used  Substance and Sexual Activity   Alcohol use: No   Drug use: No   Sexual activity: Not on file  Other Topics Concern   Not on file  Social History Narrative   Right handed   No caffeine   One story home   Social Determinants of Health   Financial Resource Strain:  Low Risk    Difficulty of Paying Living Expenses: Not hard at all  Food Insecurity: No Food Insecurity   Worried About Charity fundraiser in the Last Year: Never true   Freer in the Last Year: Never true  Transportation Needs: No Transportation Needs   Lack of Transportation (Medical): No   Lack of Transportation (Non-Medical): No  Physical Activity: Insufficiently Active   Days of Exercise per Week: 2 days   Minutes of Exercise per Session: 40 min  Stress: No Stress Concern Present   Feeling of Stress : Not at all  Social Connections: Moderately Isolated   Frequency of Communication with Friends and Family: Twice a week  Frequency of Social Gatherings with Friends and Family: Three times a week   Attends Religious Services: More than 4 times per year   Active Member of Clubs or Organizations: No   Attends Archivist Meetings: Never   Marital Status: Widowed  Human resources officer Violence: Not At Risk   Fear of Current or Ex-Partner: No   Emotionally Abused: No   Physically Abused: No   Sexually Abused: No    Past Medical History, Surgical history, Social history, and Family history were reviewed and updated as appropriate.   Please see review of systems for further details on the patient's review from today.   Objective:   Physical Exam:  There were no vitals taken for this visit.  Physical Exam Constitutional:      General: She is not in acute distress.    Appearance: She is well-developed.  Musculoskeletal:        General: No deformity.  Neurological:     Mental Status: She is alert and oriented to person, place, and time.     Cranial Nerves: Dysarthria present.     Motor: No tremor.     Coordination: Coordination normal.     Gait: Gait normal.     Comments: Slight dysarthria chronically.  Psychiatric:        Attention and Perception: She is attentive. She does not perceive auditory hallucinations.        Mood and Affect: Mood is not anxious or  depressed. Affect is not labile, blunt, angry or inappropriate.        Speech: Speech normal.        Behavior: Behavior normal.        Thought Content: Thought content normal. Thought content is not paranoid or delusional. Thought content does not include homicidal or suicidal ideation. Thought content does not include suicidal plan.        Cognition and Memory: Cognition normal.        Judgment: Judgment normal.     Comments: Insight fair. No auditory or visual hallucinations. No delusions.  Sx well controlled. Very happy except frustrated with health problems    Lab Review:     Component Value Date/Time   NA 140 10/06/2021 1514   NA 144 04/30/2014 1321   K 4.5 10/06/2021 1514   K 3.3 (L) 04/30/2014 1321   CL 105 10/06/2021 1514   CL 102 04/30/2014 1321   CO2 19 (L) 10/06/2021 1514   CO2 28 04/30/2014 1321   GLUCOSE 82 10/06/2021 1514   GLUCOSE 114 (H) 09/24/2021 0436   GLUCOSE 120 (H) 04/30/2014 1321   BUN 33 (H) 10/06/2021 1514   BUN 37 (H) 04/30/2014 1321   CREATININE 1.46 (H) 10/06/2021 1514   CREATININE 1.75 (H) 04/30/2014 1321   CALCIUM 9.5 10/06/2021 1514   CALCIUM 8.8 04/30/2014 1321   PROT 7.2 10/06/2021 1514   PROT 7.6 04/30/2014 1321   ALBUMIN 4.5 10/06/2021 1514   ALBUMIN 3.9 04/30/2014 1321   AST 24 10/06/2021 1514   AST 32 04/30/2014 1321   ALT 16 10/06/2021 1514   ALT 27 04/30/2014 1321   ALKPHOS 83 10/06/2021 1514   ALKPHOS 73 04/30/2014 1321   BILITOT 0.5 10/06/2021 1514   BILITOT 0.6 04/30/2014 1321   GFRNONAA 43 (L) 09/24/2021 0436   GFRNONAA 31 (L) 04/30/2014 1321   GFRNONAA 26 (L) 02/01/2014 1705   GFRAA 46 (L) 05/23/2020 1434   GFRAA 37 (L) 04/30/2014 1321   GFRAA 30 (L) 02/01/2014 1705  Component Value Date/Time   WBC 7.3 10/09/2021 1017   WBC 7.5 09/24/2021 0436   RBC 4.61 10/09/2021 1017   RBC 4.61 09/24/2021 0436   HGB 12.2 10/09/2021 1017   HCT 37.5 10/09/2021 1017   PLT 277 10/09/2021 1017   MCV 81 10/09/2021 1017   MCV  81 04/30/2014 1340   MCH 26.5 (L) 10/09/2021 1017   MCH 25.8 (L) 09/24/2021 0436   MCHC 32.5 10/09/2021 1017   MCHC 31.3 09/24/2021 0436   RDW 15.5 (H) 10/09/2021 1017   RDW 16.7 (H) 04/30/2014 1340   LYMPHSABS 2.6 10/09/2021 1017   LYMPHSABS 1.8 04/30/2014 1340   MONOABS 1.3 (H) 04/30/2014 1340   EOSABS 0.1 10/09/2021 1017   EOSABS 0.0 04/30/2014 1340   BASOSABS 0.1 10/09/2021 1017   BASOSABS 0.1 04/30/2014 1340    No results found for: POCLITH, LITHIUM   No results found for: PHENYTOIN, PHENOBARB, VALPROATE, CBMZ   .res Assessment: Plan:    Schizoaffective disorder, depressive type ([place]) - Plan: Nortriptyline (Aventyl), Serum [[facility]], perphenazine (TRILAFON) 4 MG tablet, nortriptyline (PAMELOR) 50 MG capsule  Major depression, recurrent, full remission ([place]) - Plan: Nortriptyline (Aventyl), Serum [[facility]], nortriptyline (PAMELOR) 50 MG capsule  Panic disorder with agoraphobia - Plan: nortriptyline (PAMELOR) 50 MG capsule  Generalized anxiety disorder - Plan: nortriptyline (PAMELOR) 50 MG capsule  Orthostatic hypotension - Plan: Nortriptyline (Aventyl), Serum [[facility]]   Greater than 50% of 30 min face to face time with patient was spent on counseling and coordination of care. We discussed Patient has a history of schizoaffective disorder generalized anxiety disorder and panic disorder and had been stable for many years.  She had a medical hospitalization for surgery and developed psychotic delirium in April 2019.  During that time in her recovery her nortriptyline dose was reduced by 50% to 50 mg daily, her perphenazine was discontinued and she was weaned off lorazepam.  She had no occasions of relapse of psychotic symptoms since being off the perphenazine so we will leave her off that.  Her depression has been stable with a lower dose no of nortriptyline so we will would not renew that.  Her panic disorder and has not recurred so we will not restart any benzodiazepine.  She  will let us know if she has any recurrence of symptoms.  She has had repeated syncopal episodes.  From reviewing the notes it does not appear to be related to any cardiac conduction delay problems and therefore unlikely related to nortriptyline or any other psychiatric medicine.  I have reviewed the cardiology notes and cardiology is not suggesting any concerns about psychiatric medicines either. Reports it's due to orthostatic hypotension.  Doing well with only perphenazine 4 HS Nortriptyline 50 HS. 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  But not likely the cause. Off lorazepam for a couple of years  No med changes indicated. High relapse risk without it. Doses are low.  Discussed potential metabolic side effects associated with atypical antipsychotics, as well as potential risk for movement side effects. Advised pt to contact office if movement side effects occur.   Call if any relapses.    Follow-up 12 months  Lynder Parents, MD, DFAPA  Please see After Visit Summary for patient specific instructions.  Future Appointments  Date Time Provider [place]  10/26/2021  1:00 PM [facility]US [facility]US [facility]  11/02/2021 11:30 AM Stoioff, Ronda Fairly, MD [facility] None  11/03/2021 10:20 AM Kate Sable, MD [facility]  11/06/2021  3:40 PM Tally Joe  T, FNP [facility]  11/22/2021 11:40 AM Vickie Epley, MD [facility]  12/12/2021  2:30 PM Cameron Sprang, MD [facility] None  03/09/2022  1:40 PM Gwyneth Sprout, FNP [facility]  08/09/2022  1:00 PM BFP-NURSE HEALTH ADVISOR [facility] P[facility]    Orders Placed This Encounter  Procedures   Nortriptyline (Aventyl), Serum [[facility]]      -------------------------------

## 2021-10-20 ENCOUNTER — Ambulatory Visit: Payer: Medicare Other | Admitting: Family Medicine

## 2021-10-20 ENCOUNTER — Encounter (INDEPENDENT_AMBULATORY_CARE_PROVIDER_SITE_OTHER): Payer: Self-pay | Admitting: Nurse Practitioner

## 2021-10-20 ENCOUNTER — Ambulatory Visit (INDEPENDENT_AMBULATORY_CARE_PROVIDER_SITE_OTHER): Payer: Medicare Other | Admitting: Nurse Practitioner

## 2021-10-20 VITALS — BP 111/72 | HR 70 | Resp 17 | Ht 66.0 in | Wt 159.0 lb

## 2021-10-20 DIAGNOSIS — E7800 Pure hypercholesterolemia, unspecified: Secondary | ICD-10-CM | POA: Diagnosis not present

## 2021-10-20 DIAGNOSIS — I6523 Occlusion and stenosis of bilateral carotid arteries: Secondary | ICD-10-CM | POA: Diagnosis not present

## 2021-10-20 DIAGNOSIS — F251 Schizoaffective disorder, depressive type: Secondary | ICD-10-CM | POA: Diagnosis not present

## 2021-10-20 DIAGNOSIS — I10 Essential (primary) hypertension: Secondary | ICD-10-CM | POA: Diagnosis not present

## 2021-10-20 DIAGNOSIS — F3342 Major depressive disorder, recurrent, in full remission: Secondary | ICD-10-CM | POA: Diagnosis not present

## 2021-10-22 HISTORY — PX: OTHER SURGICAL HISTORY: SHX169

## 2021-10-25 ENCOUNTER — Other Ambulatory Visit: Payer: Self-pay | Admitting: Family Medicine

## 2021-10-25 DIAGNOSIS — I10 Essential (primary) hypertension: Secondary | ICD-10-CM

## 2021-10-25 LAB — NORTRIPTYLINE (AVENTYL), SERUM: Nortriptyline (Aventyl), Serum: 96 ng/mL (ref 50–150)

## 2021-10-25 NOTE — Telephone Encounter (Signed)
Medication Refill - Medication:Medication Refill - Medication: amLODipine (NORVASC) 5 MG tablet  Has the patient contacted their pharmacy? yes (Agent: If no, request that the patient contact the pharmacy for the refill. If patient does not wish to contact the pharmacy document the reason why and proceed with request.) (Agent: If yes, when and what did the pharmacy advise?)no answer when pt called  Preferred Pharmacy (with phone number or street name): [facility] [address] [place]), [place] - [address]  Phone:  [phone] Fax:  [phone]  Has the patient been seen for an appointment in the last year OR does the patient have an upcoming appointment? yes  Agent: Please be advised that RX refills may take up to 3 business days. We ask that you follow-up with your pharmacy.

## 2021-10-26 ENCOUNTER — Ambulatory Visit
Admission: RE | Admit: 2021-10-26 | Discharge: 2021-10-26 | Disposition: A | Discharge status: Home / Self Care | Payer: Medicare Other | Source: Ambulatory Visit | Attending: Nephrology | Admitting: Nephrology

## 2021-10-26 DIAGNOSIS — N281 Cyst of kidney, acquired: Secondary | ICD-10-CM

## 2021-10-26 DIAGNOSIS — R809 Proteinuria, unspecified: Secondary | ICD-10-CM

## 2021-10-26 DIAGNOSIS — E785 Hyperlipidemia, unspecified: Secondary | ICD-10-CM

## 2021-10-26 DIAGNOSIS — I10 Essential (primary) hypertension: Secondary | ICD-10-CM | POA: Diagnosis not present

## 2021-10-26 DIAGNOSIS — R8290 Unspecified abnormal findings in urine: Secondary | ICD-10-CM | POA: Diagnosis not present

## 2021-10-26 DIAGNOSIS — N1832 Chronic kidney disease, stage 3b: Secondary | ICD-10-CM

## 2021-10-26 DIAGNOSIS — N189 Chronic kidney disease, unspecified: Secondary | ICD-10-CM | POA: Diagnosis not present

## 2021-10-26 IMAGING — US US RENAL
1 series · 14 of 25 positions shown · non-contrast
Comparison: CT abdomen pelvis [DATE]

CLINICAL DATA: Chronic renal disease.

EXAM:
RENAL / URINARY TRACT ULTRASOUND COMPLETE

[Series 1: us renal · 0.22mm/px · 14 of 70 slices shown]
[im 1/70]
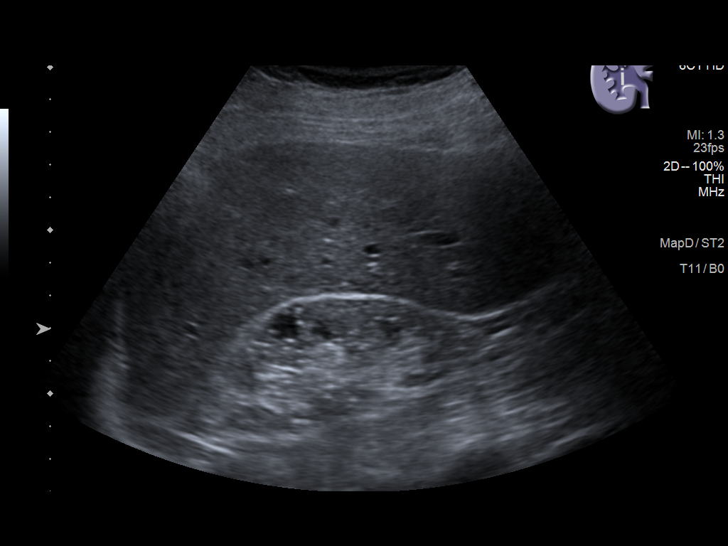
[im 6/70]
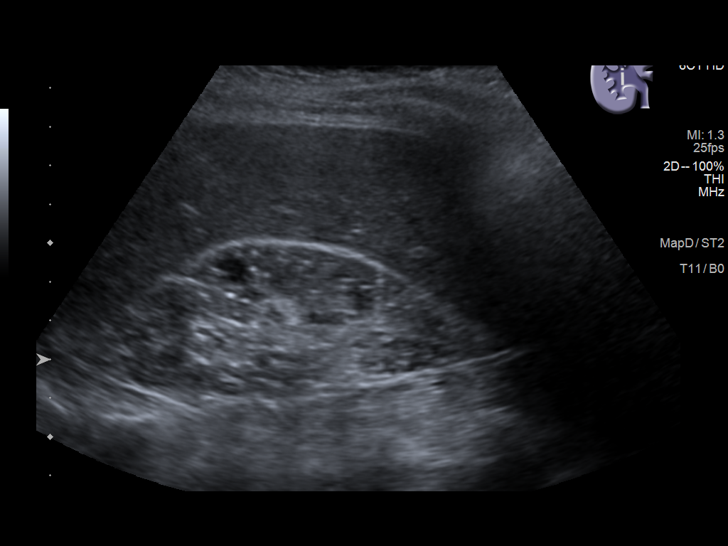
[im 12/70]
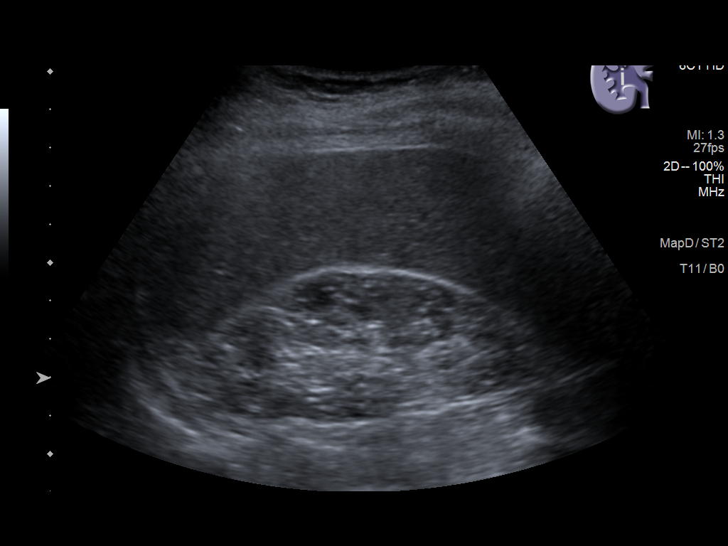
[im 18/70]
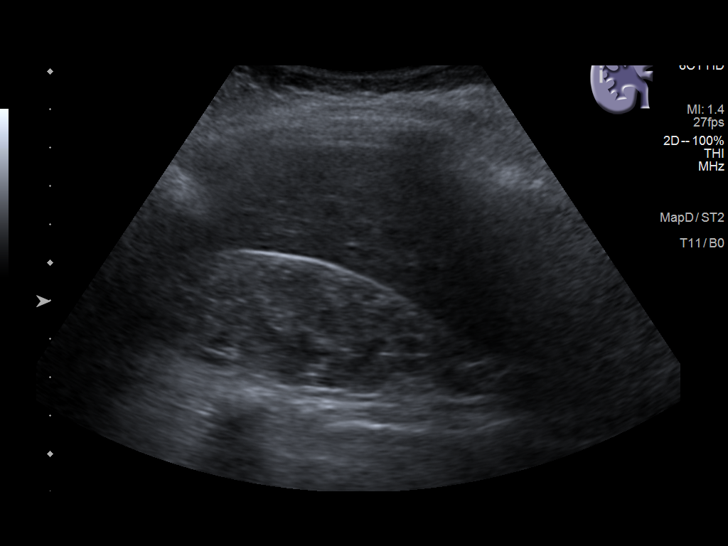
[im 24/70]
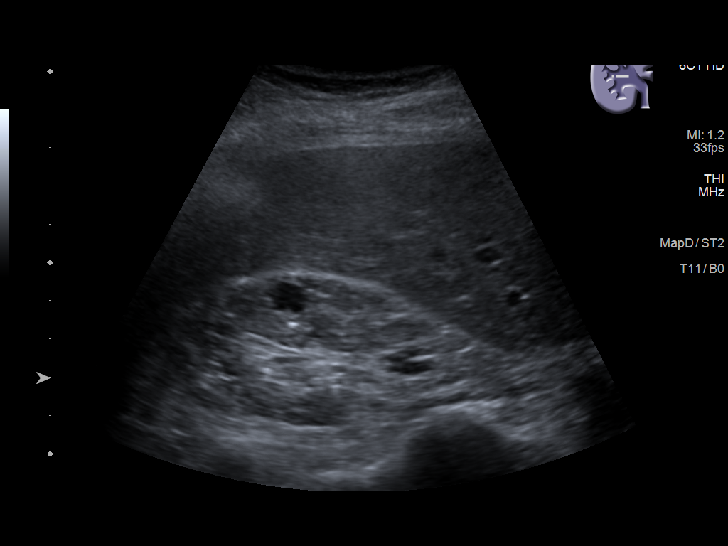
[im 26/70]
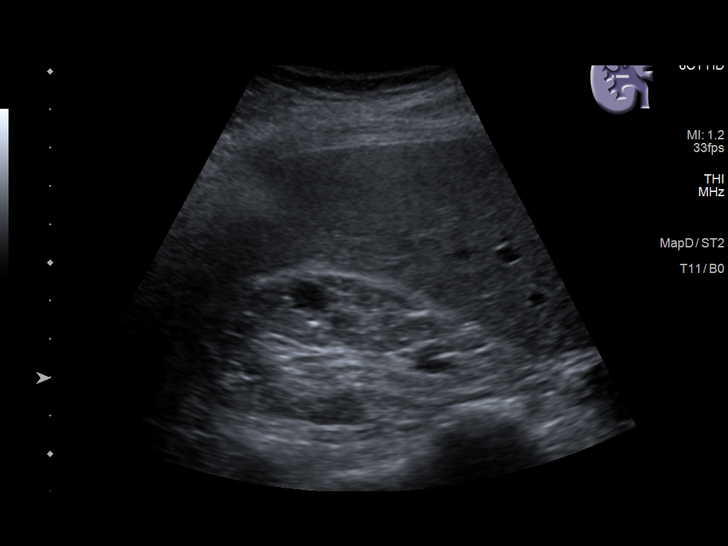
[im 32/70]
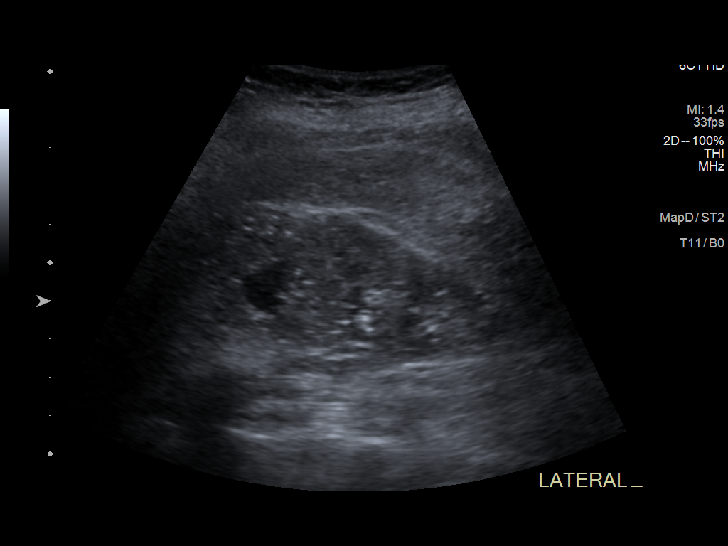
[im 38/70]
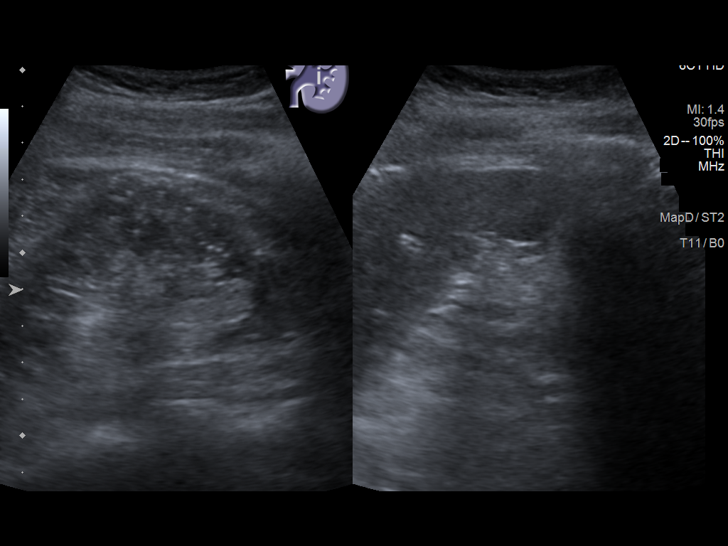
[im 44/70]
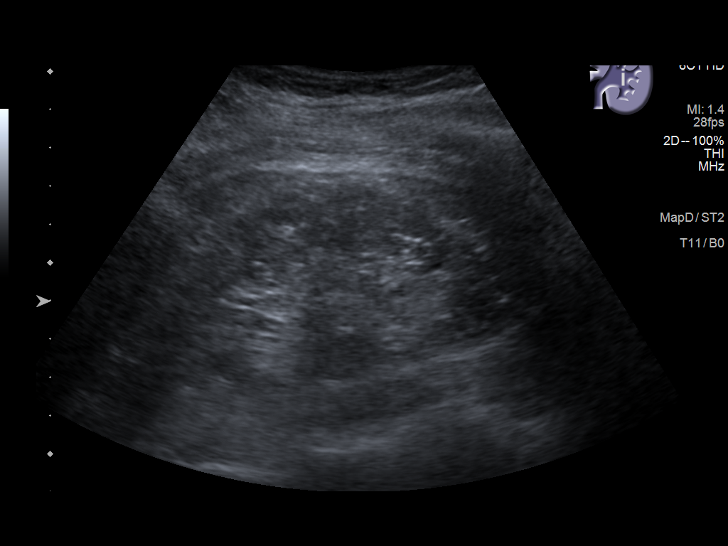
[im 47/70]
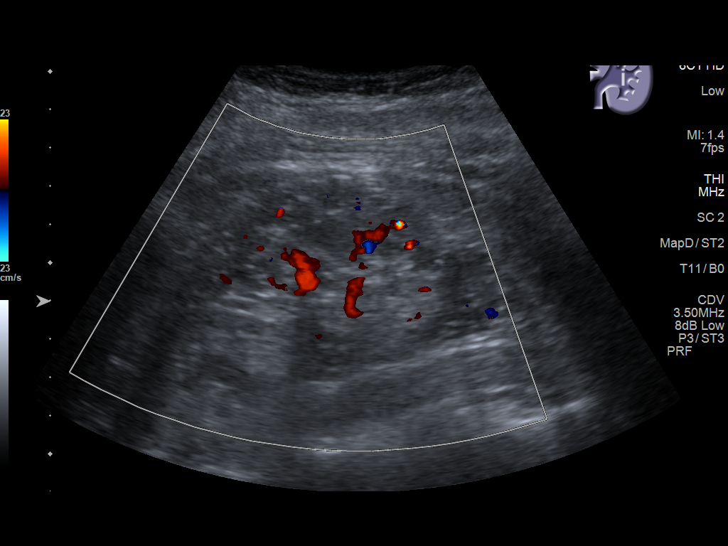
[im 52/70]
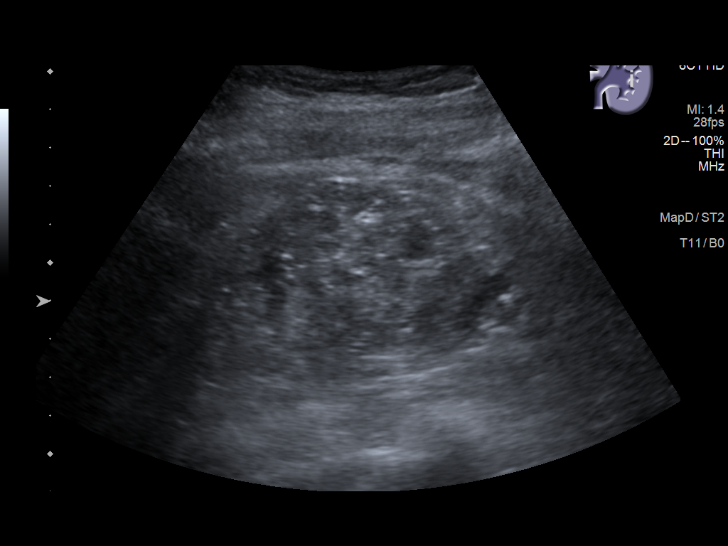
[im 58/70]
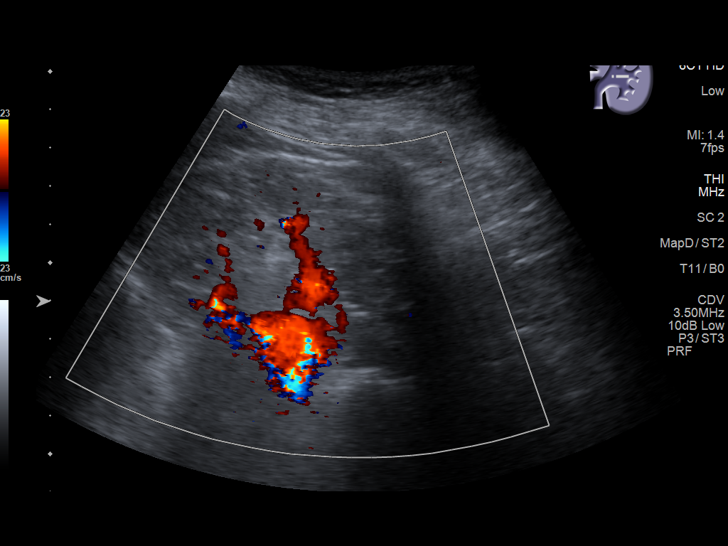
[im 64/70]
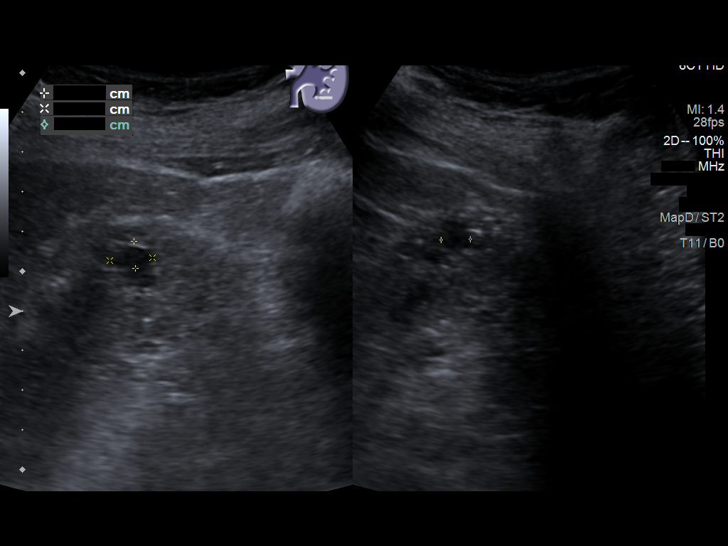
[im 70/70]
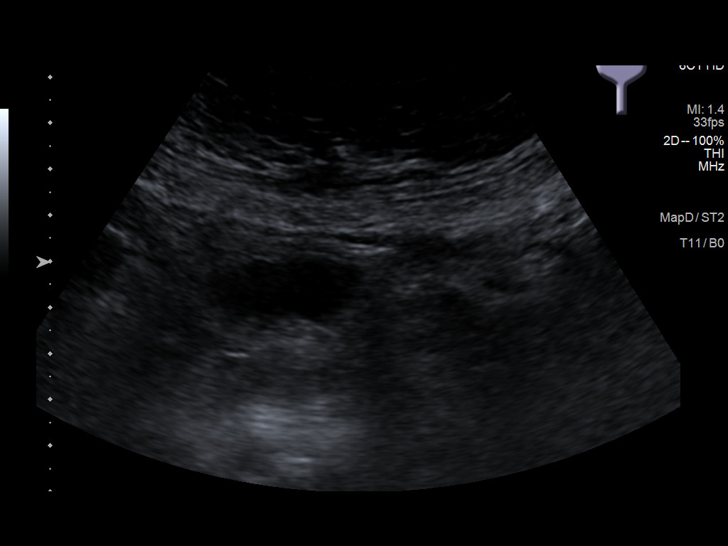

[14 of 25 positions shown; findings below may reference images not displayed]

FINDINGS: Right Kidney:

Renal measurements: 8.9 x 4.1 x 4.7 cm = volume: 89.8 mL. Increased
renal cortical echogenicity. Normal renal cortical thickness. No
hydronephrosis. There is a 1.0 x 1.0 x 0.7 cm cyst within the
superior pole. There is a 1.3 x 1.0 x 1.2 cm cyst within the
superior pole.

Left Kidney:

Renal measurements: 9.6 x 4.9 x 4.7 cm = volume: 115 mL. Increased
renal cortical echogenicity. Normal renal cortical thickness. No
hydronephrosis. Subcentimeter hypoechoic lesion, too small to
characterize.

Bladder:

Appears normal for degree of bladder distention.

Other:

None.
IMPRESSION: Increased renal cortical echogenicity compatible with chronic
medical renal disease.

No hydronephrosis.

## 2021-10-26 NOTE — Telephone Encounter (Signed)
Refilled 09/29/21 # 90 with refill.

## 2021-10-27 NOTE — Progress Notes (Signed)
Her nortriptyline level on 50 mg nightly is 96 which is in the normal range.  Therefore it is not likely the cause of her orthostatic hypotension.  No change indicated.

## 2021-10-29 ENCOUNTER — Encounter (INDEPENDENT_AMBULATORY_CARE_PROVIDER_SITE_OTHER): Payer: Self-pay | Admitting: Nurse Practitioner

## 2021-10-29 NOTE — Progress Notes (Signed)
Subjective:    Patient ID: Jessica Little, female    DOB: 30-Apr-1946, 76 y.o.   MRN: [id number] Chief Complaint  Patient presents with   Establish Care    Referred bt Dr Azucena Cecil    The patient is seen for evaluation of carotid stenosis. The carotid stenosis was identified after ultrasound on 09/23/2021.  The patient denies amaurosis fugax. There is no recent history of TIA symptoms or focal motor deficits. There is no prior documented CVA.  There is no history of migraine headaches.  There is a history of seizure-like activity.  The patient also has a history of vasovagal as well as orthostatic hypotension  The patient is taking enteric-coated aspirin 81 mg daily.  No recent shortening of the patient's walking distance or new symptoms consistent with claudication.  No history of rest pain symptoms. No new ulcers or wounds of the lower extremities have occurred.  There is no history of DVT, PE or superficial thrombophlebitis. No recent episodes of angina or shortness of breath documented.    Previous studies indicate a less than 50% stenosis of the bilateral internal carotid arteries.  The patient has normal antegrade flow in the bilateral vertebral arteries.   Review of Systems     Objective:   Physical Exam  BP 111/72 (BP Location: Right Arm)   Pulse 70   Resp 17   Ht 5\' 6"  (1.676 m)   Wt 159 lb (72.1 kg)   BMI 25.66 kg/m   Past Medical History:  Diagnosis Date   Anxiety    GERD (gastroesophageal reflux disease)    Hyperlipidemia    Hypertension     Social History   Socioeconomic History   Marital status: Widowed    Spouse name: Not on file   Number of children: 0   Years of education: College   Highest education level: Bachelor's degree (e.g., BA, AB, BS)  Occupational History   Occupation: Retired  Tobacco Use   Smoking status: Never   Smokeless tobacco: Never  Vaping Use   Vaping Use: Never used  Substance and Sexual Activity   Alcohol use: No    Drug use: No   Sexual activity: Not on file  Other Topics Concern   Not on file  Social History Narrative   Right handed   No caffeine   One story home   Social Determinants of Health   Financial Resource Strain: Low Risk    Difficulty of Paying Living Expenses: Not hard at all  Food Insecurity: No Food Insecurity   Worried About in the Last Year: Never true   Ran Out of Food in the Last Year: Never true  Transportation Needs: No Transportation Needs   Lack of Transportation (Medical): No   Lack of Transportation (Non-Medical): No  Physical Activity: Insufficiently Active   Days of Exercise per Week: 2 days   Minutes of Exercise per Session: 40 min  Stress: No Stress Concern Present   Feeling of Stress : Not at all  Social Connections: Moderately Isolated   Frequency of Communication with Friends and Family: Twice a week   Frequency of Social Gatherings with Friends and Family: Three times a week   Attends Religious Services: More than 4 times per year   Active Member of Clubs or Organizations: No   Attends Programme researcher, broadcasting/film/video Meetings: Never   Marital Status: Widowed  Intimate Partner Violence: Not At Risk   Fear of Current or Ex-Partner: No  Emotionally Abused: No   Physically Abused: No   Sexually Abused: No    Past Surgical History:  Procedure Laterality Date   CATARACT EXTRACTION W/PHACO Left 01/18/2021   Procedure: CATARACT EXTRACTION PHACO AND INTRAOCULAR LENS PLACEMENT (IOC) LEFT 7.40 01:11.7;  Surgeon: Lockie MolaBrasington, Chadwick, MD;  [facility];  Service: Ophthalmology;  Laterality: Left;   CATARACT EXTRACTION W/PHACO Right 02/01/2021   Procedure: CATARACT EXTRACTION PHACO AND INTRAOCULAR LENS PLACEMENT (IOC) RIGHT EYHANCE TORIC 8.60 01:09.9 ;  Surgeon: Lockie MolaBrasington, Chadwick, MD;  [facility];  Service: Ophthalmology;  Laterality: Right;   CHOLECYSTECTOMY     COLONOSCOPY  01/01/2006   COLONOSCOPY WITH PROPOFOL  N/A 08/23/2015   Procedure: COLONOSCOPY WITH PROPOFOL;  Surgeon: Earline MayotteJeffrey W Byrnett, MD;  [facility];  Service: Endoscopy;  Laterality: N/A;   HIATAL HERNIA REPAIR  2019    Family History  Problem Relation Age of Onset   Arrhythmia Mother    Heart attack Father    Stroke Father    Healthy Sister    Healthy Brother    Healthy Sister    Breast cancer Neg Hx     No Known Allergies     Latest Ref Rng & Units 10/09/2021   10:17 AM 09/24/2021    4:36 AM 09/22/2021    4:17 PM  CBC  WBC 3.4 - 10.8 x10E3/uL 7.3   7.5   9.7    Hemoglobin 11.1 - 15.9 g/dL 16.112.2   09.611.9   04.513.4    Hematocrit 34.0 - 46.6 % 37.5   38.0   43.0    Platelets 150 - 450 x10E3/uL 277   191   223        CMP     Component Value Date/Time   NA 140 10/06/2021 1514   NA 144 04/30/2014 1321   K 4.5 10/06/2021 1514   K 3.3 (L) 04/30/2014 1321   CL 105 10/06/2021 1514   CL 102 04/30/2014 1321   CO2 19 (L) 10/06/2021 1514   CO2 28 04/30/2014 1321   GLUCOSE 82 10/06/2021 1514   GLUCOSE 114 (H) 09/24/2021 0436   GLUCOSE 120 (H) 04/30/2014 1321   BUN 33 (H) 10/06/2021 1514   BUN 37 (H) 04/30/2014 1321   CREATININE 1.46 (H) 10/06/2021 1514   CREATININE 1.75 (H) 04/30/2014 1321   CALCIUM 9.5 10/06/2021 1514   CALCIUM 8.8 04/30/2014 1321   PROT 7.2 10/06/2021 1514   PROT 7.6 04/30/2014 1321   ALBUMIN 4.5 10/06/2021 1514   ALBUMIN 3.9 04/30/2014 1321   AST 24 10/06/2021 1514   AST 32 04/30/2014 1321   ALT 16 10/06/2021 1514   ALT 27 04/30/2014 1321   ALKPHOS 83 10/06/2021 1514   ALKPHOS 73 04/30/2014 1321   BILITOT 0.5 10/06/2021 1514   BILITOT 0.6 04/30/2014 1321   GFRNONAA 43 (L) 09/24/2021 0436   GFRNONAA 31 (L) 04/30/2014 1321   GFRNONAA 26 (L) 02/01/2014 1705   GFRAA 46 (L) 05/23/2020 1434   GFRAA 37 (L) 04/30/2014 1321   GFRAA 30 (L) 02/01/2014 1705     No results found.     Assessment & Plan:   1. Carotid stenosis, asymptomatic, bilateral Recommend:  Given the patient's  asymptomatic subcritical stenosis no further invasive testing or surgery at this time.  Duplex ultrasound shows <50% stenosis bilaterally.  Antegrade flow in the bilateral vertebral arteries noted.  Continue antiplatelet therapy as prescribed Continue management of CAD, HTN and Hyperlipidemia Healthy heart diet,  encouraged  exercise at least 4 times per week Follow up in 12 months with duplex ultrasound and physical exam    2. Primary hypertension Continue antihypertensive medications as already ordered, these medications have been reviewed and there are no changes at this time.   3. Pure hypercholesterolemia Continue statin as ordered and reviewed, no changes at this time    Current Outpatient Medications on File Prior to Visit  Medication Sig Dispense Refill   amLODipine (NORVASC) 5 MG tablet Take 1 tablet (5 mg total) by mouth daily. 90 tablet 1   aspirin EC 81 MG tablet Take 81 mg by mouth daily. Swallow whole.     atenolol (TENORMIN) 50 MG tablet Take 1 tablet (50 mg total) by mouth daily. 90 tablet 1   atorvastatin (LIPITOR) 40 MG tablet Take 1 tablet (40 mg total) by mouth daily. 90 tablet 1   Biotin 16109 MCG TABS Take by mouth.     calcium carbonate (OS-CAL) 600 MG TABS tablet Take 600 mg by mouth daily with breakfast.     Cholecalciferol (VITAMIN D3 PO) Take by mouth daily.     lisinopril (ZESTRIL) 20 MG tablet Take 2 tablets (40 mg total) by mouth daily. 180 tablet 1   Magnesium 400 MG CAPS Take by mouth.     nortriptyline (PAMELOR) 50 MG capsule Take 1 capsule (50 mg total) by mouth at bedtime. 90 capsule 3   omeprazole (PRILOSEC) 40 MG capsule Take 1 capsule (40 mg total) by mouth 2 (two) times daily. 180 capsule 1   perphenazine (TRILAFON) 4 MG tablet TAKE 1 TABLET BY MOUTH AT BEDTIME 90 tablet 3   potassium chloride (KLOR-CON M) 10 MEQ tablet Take 1 tablet by mouth once daily 90 tablet 0   vitamin E 1000 UNIT capsule Take 1,000 Units by mouth daily.     Zinc Sulfate  (ZINC 15 PO) Take by mouth.     Flaxseed, Linseed, (FLAXSEED OIL) 1000 MG CAPS Take by mouth. (Patient not taking: Reported on 10/20/2021)     No current facility-administered medications on file prior to visit.    There are no Patient Instructions on file for this visit. No follow-ups on file.   Georgiana Spinner, NP

## 2021-11-01 NOTE — Progress Notes (Signed)
Established patient visit   Patient: Jessica Little   DOB: 03-12-1946   76 y.o. Female  MRN: [id number] Visit Date: 11/06/2021  Today's healthcare provider: Gwyneth Sprout, FNP   I,Tiffany J Bragg,acting as a scribe for Gwyneth Sprout, FNP.,have documented all relevant documentation on the behalf of Gwyneth Sprout, FNP,as directed by  Gwyneth Sprout, FNP while in the presence of Gwyneth Sprout, FNP.   Chief Complaint  Patient presents with   Hypertension   Subjective    HPI  Hypertension, follow-up  BP Readings from Last 3 Encounters:  11/06/21 (!) 125/95  11/03/21 116/70  11/02/21 112/66   Wt Readings from Last 3 Encounters:  11/06/21 157 lb 3.2 oz (71.3 kg)  11/03/21 160 lb (72.6 kg)  11/02/21 158 lb (71.7 kg)     She was last seen for hypertension 1 months ago.  BP at that visit was 111/72. Management since that visit includes continue medication.  She reports excellent compliance with treatment. She is not having side effects.  She is following a Regular diet. She  walks for  exercise. She does not smoke.  Use of agents associated with hypertension: none.   Outside blood pressures are between 140/80 and 186/98 Symptoms: No chest pain No chest pressure  No palpitations No syncope  No dyspnea No orthopnea  No paroxysmal nocturnal dyspnea No lower extremity edema   Pertinent labs Lab Results  Component Value Date   CHOL 176 08/03/2021   HDL 53 08/03/2021   LDLCALC 95 08/03/2021   TRIG 164 (H) 08/03/2021   CHOLHDL 3.3 08/03/2021   Lab Results  Component Value Date   NA 140 10/06/2021   K 4.5 10/06/2021   CREATININE 1.46 (H) 10/06/2021   EGFR 38 (L) 10/06/2021   GLUCOSE 82 10/06/2021   TSH 1.126 09/22/2021     The 10-year ASCVD risk score (Arnett DK, et al., 2019) is: 18%  ---------------------------------------------------------------------------------------------------   Medications: Outpatient Medications Prior to Visit  Medication Sig    amLODipine (NORVASC) 5 MG tablet Take 1 tablet (5 mg total) by mouth daily.   aspirin EC 81 MG tablet Take 81 mg by mouth daily. Swallow whole.   atenolol (TENORMIN) 50 MG tablet Take 1 tablet (50 mg total) by mouth daily.   atorvastatin (LIPITOR) 40 MG tablet Take 1 tablet (40 mg total) by mouth daily.   Biotin 10000 MCG TABS Take by mouth.   Calcium-Magnesium-Zinc 500-250-12.5 MG TABS Take 1 tablet by mouth daily. Dose unknown   Cholecalciferol (VITAMIN D3 PO) Take by mouth daily.   Flaxseed, Linseed, (FLAXSEED OIL) 1000 MG CAPS Take by mouth.   lisinopril (ZESTRIL) 20 MG tablet Take 2 tablets (40 mg total) by mouth daily.   nortriptyline (PAMELOR) 50 MG capsule Take 1 capsule (50 mg total) by mouth at bedtime.   omeprazole (PRILOSEC) 40 MG capsule Take 1 capsule (40 mg total) by mouth 2 (two) times daily.   perphenazine (TRILAFON) 4 MG tablet TAKE 1 TABLET BY MOUTH AT BEDTIME   potassium chloride (KLOR-CON M) 10 MEQ tablet Take 1 tablet by mouth once daily   vitamin E 1000 UNIT capsule Take 1,000 Units by mouth daily.   No facility-administered medications prior to visit.    Review of Systems  Last CBC Lab Results  Component Value Date   WBC 7.3 10/09/2021   HGB 12.2 10/09/2021   HCT 37.5 10/09/2021   MCV 81 10/09/2021   MCH 26.5 (  L) 10/09/2021   RDW 15.5 (H) 10/09/2021   PLT 277 46/65/9935   Last metabolic panel Lab Results  Component Value Date   GLUCOSE 82 10/06/2021   NA 140 10/06/2021   K 4.5 10/06/2021   CL 105 10/06/2021   CO2 19 (L) 10/06/2021   BUN 33 (H) 10/06/2021   CREATININE 1.46 (H) 10/06/2021   EGFR 38 (L) 10/06/2021   CALCIUM 9.5 10/06/2021   PHOS 3.3 09/24/2021   PROT 7.2 10/06/2021   ALBUMIN 4.5 10/06/2021   LABGLOB 2.7 10/06/2021   AGRATIO 1.7 10/06/2021   BILITOT 0.5 10/06/2021   ALKPHOS 83 10/06/2021   AST 24 10/06/2021   ALT 16 10/06/2021   ANIONGAP 6 09/24/2021   Last lipids Lab Results  Component Value Date   CHOL 176 08/03/2021    HDL 53 08/03/2021   LDLCALC 95 08/03/2021   TRIG 164 (H) 08/03/2021   CHOLHDL 3.3 08/03/2021       Objective    BP (!) 125/95 (BP Location: Right Arm, Patient Position: Sitting, Cuff Size: Normal)   Pulse 73   Resp 18   Ht 5' 6" (1.676 m)   Wt 157 lb 3.2 oz (71.3 kg)   SpO2 100%   BMI 25.37 kg/m   BP Readings from Last 3 Encounters:  11/06/21 (!) 125/95  11/03/21 116/70  11/02/21 112/66   Wt Readings from Last 3 Encounters:  11/06/21 157 lb 3.2 oz (71.3 kg)  11/03/21 160 lb (72.6 kg)  11/02/21 158 lb (71.7 kg)   SpO2 Readings from Last 3 Encounters:  11/06/21 100%  11/03/21 99%  10/06/21 100%      Physical Exam Vitals and nursing note reviewed.  Constitutional:      General: She is not in acute distress.    Appearance: Normal appearance. She is normal weight. She is not ill-appearing, toxic-appearing or diaphoretic.  HENT:     Head: Normocephalic and atraumatic.  Cardiovascular:     Rate and Rhythm: Normal rate and regular rhythm.     Pulses: Normal pulses.     Heart sounds: Normal heart sounds. No murmur heard.    No friction rub. No gallop.  Pulmonary:     Effort: Pulmonary effort is normal. No respiratory distress.     Breath sounds: Normal breath sounds. No stridor. No wheezing, rhonchi or rales.  Chest:     Chest wall: No tenderness.  Abdominal:     General: Bowel sounds are normal.     Palpations: Abdomen is soft.  Musculoskeletal:        General: No swelling, tenderness, deformity or signs of injury. Normal range of motion.     Right lower leg: No edema.     Left lower leg: No edema.  Skin:    General: Skin is warm and dry.     Capillary Refill: Capillary refill takes less than 2 seconds.     Coloration: Skin is not jaundiced or pale.     Findings: No bruising, erythema, lesion or rash.  Neurological:     General: No focal deficit present.     Mental Status: She is alert and oriented to person, place, and time. Mental status is at baseline.      Cranial Nerves: No cranial nerve deficit.     Sensory: No sensory deficit.     Motor: No weakness.     Coordination: Coordination normal.  Psychiatric:        Mood and Affect: Mood normal.  Behavior: Behavior normal.        Thought Content: Thought content normal.        Judgment: Judgment normal.      No results found for any visits on 11/06/21.  Assessment & Plan     Problem List Items Addressed This Visit       Cardiovascular and Mediastinum   Primary hypertension - Primary    Chronic, stable; review of home logs shows BP prior to medication which ranges from  SBP 120s-190s, however, out of 15 readings 11 were >140 mmHg DBP 70s-110s, primarily 90-110s HR 60-90s Date from 5/18-6/12, variable timing from 0830-1930 Denies CP Denies SOB/ DOE Denies low blood pressure/hypotension Denies vision changes No LE Edema noted on exam Continue medication, Norvasc 5 mg, Atenolol 50 mg, Lisinopril 40 mg (2-20 mg)s daily Denies side effects RTC in 3 months  Seek emergent care if you develop chest pain or chest pressure         Genitourinary   Stage 3a chronic kidney disease (HCC)    Chronic, improved Saw Dr Raliegh Ip last week for labs; and today in office Review of labs from 6/8 indicated: eGFR is now at 54; moving her back to Stage 3a Creatinine is down 0.3 pts, to  as well to 1.15 previously 1.46 Patient is being cognizant of water intake Continue to recommend follow up with nephro         Other   Personal history of fall    Chronic, plans to connect with PT to work on strength building Denies reoccurrence of falls  Has been seen by vascular of bilateral carotid stenosis and will go back next year for repeat ultrasounds; continue to recommend diet low in saturated fat and regular exercise - 30 min at least 5 times per week The 10-year ASCVD risk score (Arnett DK, et al., 2019) is: 18%   Values used to calculate the score:     Age: 75 years     Sex: Female     Is  Non-Hispanic African American: No     Diabetic: No     Tobacco smoker: No     Systolic Blood Pressure: 789 mmHg     Is BP treated: Yes     HDL Cholesterol: 53 mg/dL     Total Cholesterol: 176 mg/dL         Return in about 3 months (around 02/06/2022) for chonic disease management.      Vonna Kotyk, FNP, have reviewed all documentation for this visit. The documentation on 11/06/21 for the exam, diagnosis, procedures, and orders are all accurate and complete.    Gwyneth Sprout, [place] [phone] (phone) [phone] (fax)  [place]

## 2021-11-02 ENCOUNTER — Ambulatory Visit (INDEPENDENT_AMBULATORY_CARE_PROVIDER_SITE_OTHER): Payer: Medicare Other | Admitting: Urology

## 2021-11-02 ENCOUNTER — Encounter: Payer: Self-pay | Admitting: Urology

## 2021-11-02 VITALS — BP 112/66 | HR 73 | Ht 66.0 in | Wt 158.0 lb

## 2021-11-02 DIAGNOSIS — R3129 Other microscopic hematuria: Secondary | ICD-10-CM

## 2021-11-02 DIAGNOSIS — E785 Hyperlipidemia, unspecified: Secondary | ICD-10-CM | POA: Diagnosis not present

## 2021-11-02 DIAGNOSIS — K219 Gastro-esophageal reflux disease without esophagitis: Secondary | ICD-10-CM | POA: Diagnosis not present

## 2021-11-02 DIAGNOSIS — I10 Essential (primary) hypertension: Secondary | ICD-10-CM | POA: Diagnosis not present

## 2021-11-02 DIAGNOSIS — R8290 Unspecified abnormal findings in urine: Secondary | ICD-10-CM | POA: Diagnosis not present

## 2021-11-02 NOTE — Progress Notes (Signed)
11/02/2021 11:48 AM   Jessica Little [birth year] [id number]  Referring provider: Gwyneth Sprout, [address] [place],  [place] [address]  Chief Complaint  Patient presents with   Other    Urologic history: 1.  High risk hematuria CTU 04/2021 small renal lesions consistent with cysts Cystoscopy 04/2021 without abnormality   HPI: 76 y.o. female presents for follow-up  Doing well since last visit No bothersome LUTS Denies dysuria, gross hematuria Denies flank, abdominal or pelvic pain RUS ordered by nephrology performed earlier this month showed no significant abnormalities  PMH: Past Medical History:  Diagnosis Date   Anxiety    GERD (gastroesophageal reflux disease)    Hyperlipidemia    Hypertension     Surgical History: Past Surgical History:  Procedure Laterality Date   CATARACT EXTRACTION W/PHACO Left 01/18/2021   Procedure: CATARACT EXTRACTION PHACO AND INTRAOCULAR LENS PLACEMENT ([place]) LEFT 7.40 01:11.7;  Surgeon: Leandrew Koyanagi, MD;  Location: [facility];  Service: Ophthalmology;  Laterality: Left;   CATARACT EXTRACTION W/PHACO Right 02/01/2021   Procedure: CATARACT EXTRACTION PHACO AND INTRAOCULAR LENS PLACEMENT ([place]) RIGHT EYHANCE TORIC 8.60 01:09.9 ;  Surgeon: Leandrew Koyanagi, MD;  Location: [facility];  Service: Ophthalmology;  Laterality: Right;   CHOLECYSTECTOMY     COLONOSCOPY  01/01/2006   COLONOSCOPY WITH PROPOFOL N/A 08/23/2015   Procedure: COLONOSCOPY WITH PROPOFOL;  Surgeon: Robert Bellow, MD;  Location: [facility] ENDOSCOPY;  Service: Endoscopy;  Laterality: N/A;   HIATAL HERNIA REPAIR  2019    Home Medications:  Allergies as of 11/02/2021   No Known Allergies      Medication List        Accurate as of November 02, 2021 11:48 AM. If you have any questions, ask your nurse or doctor.          amLODipine 5 MG tablet Commonly known as: NORVASC Take 1 tablet (5 mg total) by mouth daily.   aspirin EC 81 MG  tablet Take 81 mg by mouth daily. Swallow whole.   atenolol 50 MG tablet Commonly known as: TENORMIN Take 1 tablet (50 mg total) by mouth daily.   atorvastatin 40 MG tablet Commonly known as: LIPITOR Take 1 tablet (40 mg total) by mouth daily.   Biotin 10000 MCG Tabs Take by mouth.   calcium carbonate 600 MG Tabs tablet Commonly known as: OS-CAL Take 600 mg by mouth daily with breakfast.   Flaxseed Oil 1000 MG Caps Take by mouth.   lisinopril 20 MG tablet Commonly known as: ZESTRIL Take 2 tablets (40 mg total) by mouth daily.   Magnesium 400 MG Caps Take by mouth.   nortriptyline 50 MG capsule Commonly known as: PAMELOR Take 1 capsule (50 mg total) by mouth at bedtime.   omeprazole 40 MG capsule Commonly known as: PRILOSEC Take 1 capsule (40 mg total) by mouth 2 (two) times daily.   perphenazine 4 MG tablet Commonly known as: TRILAFON TAKE 1 TABLET BY MOUTH AT BEDTIME   potassium chloride 10 MEQ tablet Commonly known as: KLOR-CON M Take 1 tablet by mouth once daily   VITAMIN D3 PO Take by mouth daily.   vitamin E 1000 UNIT capsule Take 1,000 Units by mouth daily.   ZINC 15 PO Take by mouth.        Allergies: No Known Allergies  Family History: Family History  Problem Relation Age of Onset   Arrhythmia Mother    Heart attack Father    Stroke Father  Healthy Sister    Healthy Brother    Healthy Sister    Breast cancer Neg Hx     Social History:  reports that she has never smoked. She has never used smokeless tobacco. She reports that she does not drink alcohol and does not use drugs.   Physical Exam: BP 112/66   Pulse 73   Ht 5\' 6"  (1.676 m)   Wt 158 lb (71.7 kg)   BMI 25.50 kg/m   Constitutional:  Alert and oriented, No acute distress. HEENT: [place] AT Respiratory: Normal respiratory effort, no increased work of breathing. Psychiatric: Normal mood and affect.  Laboratory Data:  Urinalysis Micro no RBCs/WBCs, calcium oxalate  crystals noted   Assessment & Plan:    1. Microhematuria UA today clear 1 year follow-up with repeat Hurt, MD  [facility] [address], [place], [place] [address] [phone]

## 2021-11-03 ENCOUNTER — Encounter: Payer: Self-pay | Admitting: Cardiology

## 2021-11-03 ENCOUNTER — Ambulatory Visit (INDEPENDENT_AMBULATORY_CARE_PROVIDER_SITE_OTHER): Payer: Medicare Other | Admitting: Cardiology

## 2021-11-03 VITALS — BP 116/70 | HR 74 | Ht 66.0 in | Wt 160.0 lb

## 2021-11-03 DIAGNOSIS — R55 Syncope and collapse: Secondary | ICD-10-CM | POA: Diagnosis not present

## 2021-11-03 DIAGNOSIS — I10 Essential (primary) hypertension: Secondary | ICD-10-CM | POA: Diagnosis not present

## 2021-11-03 DIAGNOSIS — I4729 Other ventricular tachycardia: Secondary | ICD-10-CM

## 2021-11-03 DIAGNOSIS — I6523 Occlusion and stenosis of bilateral carotid arteries: Secondary | ICD-10-CM

## 2021-11-03 LAB — URINALYSIS, COMPLETE
Bilirubin, UA: NEGATIVE
Glucose, UA: NEGATIVE
Ketones, UA: NEGATIVE
Leukocytes,UA: NEGATIVE
Nitrite, UA: NEGATIVE
Protein,UA: NEGATIVE
RBC, UA: NEGATIVE
Specific Gravity, UA: 1.01 (ref 1.005–1.030)
Urobilinogen, Ur: 0.2 mg/dL (ref 0.2–1.0)
pH, UA: 6 (ref 5.0–7.5)

## 2021-11-03 LAB — MICROSCOPIC EXAMINATION
Bacteria, UA: NONE SEEN
RBC, Urine: NONE SEEN /hpf (ref 0–2)

## 2021-11-03 MED ORDER — ATORVASTATIN CALCIUM 40 MG PO TABS: 40.0000 mg | ORAL_TABLET | Freq: Every day | ORAL | 1 refills | Status: DC

## 2021-11-03 NOTE — Progress Notes (Signed)
Cardiology Office Note:    Date:  11/03/2021   ID:  Jessica Little, DOB 10/17/1945, MRN [id number]  PCP:  Gwyneth Sprout, FNP   [facility] Providers Cardiologist:  None     Referring MD: Gwyneth Sprout, FNP   Chief Complaint  Patient presents with   Follow-up    History of Present Illness:    Jessica Little is a 76 y.o. female with a hx of hypertension, hyperlipidemia, paroxysmal SVT, CKD, syncope who presents for follow-up.    Previously seen due to syncopal episodes deemed likely vasovagal versus orthostasis.  Cardiac monitor did not reveal significant arrhythmias to suggest etiology of syncope.  She has not had any episodes since.  Blood pressures are adequately controlled.  Establish care with vascular surgery due to carotid stenosis.  She states feeling well, has no concerns at this time.   Prior notes Echo 04/2019 20 EF 60 to 65% Cardiac monitor 03/2021, paroxysmal SVT, nonsustained VT lasting up to 14 beats.  Past Medical History:  Diagnosis Date   Anxiety    GERD (gastroesophageal reflux disease)    Hyperlipidemia    Hypertension     Past Surgical History:  Procedure Laterality Date   CATARACT EXTRACTION W/PHACO Left 01/18/2021   Procedure: CATARACT EXTRACTION PHACO AND INTRAOCULAR LENS PLACEMENT ([facility]) LEFT 7.40 01:11.7;  Surgeon: Leandrew Koyanagi, MD;  Location: [facility];  Service: Ophthalmology;  Laterality: Left;   CATARACT EXTRACTION W/PHACO Right 02/01/2021   Procedure: CATARACT EXTRACTION PHACO AND INTRAOCULAR LENS PLACEMENT ([place]) RIGHT EYHANCE TORIC 8.60 01:09.9 ;  Surgeon: Leandrew Koyanagi, MD;  Location: [facility];  Service: Ophthalmology;  Laterality: Right;   CHOLECYSTECTOMY     COLONOSCOPY  01/01/2006   COLONOSCOPY WITH PROPOFOL N/A 08/23/2015   Procedure: COLONOSCOPY WITH PROPOFOL;  Surgeon: Robert Bellow, MD;  Location: [facility] ENDOSCOPY;  Service: Endoscopy;  Laterality: N/A;   HIATAL HERNIA REPAIR  2019     Current Medications: Current Meds  Medication Sig   amLODipine (NORVASC) 5 MG tablet Take 1 tablet (5 mg total) by mouth daily.   aspirin EC 81 MG tablet Take 81 mg by mouth daily. Swallow whole.   atenolol (TENORMIN) 50 MG tablet Take 1 tablet (50 mg total) by mouth daily.   Biotin 10000 MCG TABS Take by mouth.   Calcium-Magnesium-Zinc 500-250-12.5 MG TABS Take 1 tablet by mouth daily. Dose unknown   Cholecalciferol (VITAMIN D3 PO) Take by mouth daily.   Flaxseed, Linseed, (FLAXSEED OIL) 1000 MG CAPS Take by mouth.   lisinopril (ZESTRIL) 20 MG tablet Take 2 tablets (40 mg total) by mouth daily.   nortriptyline (PAMELOR) 50 MG capsule Take 1 capsule (50 mg total) by mouth at bedtime.   omeprazole (PRILOSEC) 40 MG capsule Take 1 capsule (40 mg total) by mouth 2 (two) times daily.   perphenazine (TRILAFON) 4 MG tablet TAKE 1 TABLET BY MOUTH AT BEDTIME   potassium chloride (KLOR-CON M) 10 MEQ tablet Take 1 tablet by mouth once daily   vitamin E 1000 UNIT capsule Take 1,000 Units by mouth daily.   [DISCONTINUED] atorvastatin (LIPITOR) 40 MG tablet Take 1 tablet (40 mg total) by mouth daily.     Allergies:   Patient has no known allergies.   Social History   Socioeconomic History   Marital status: Widowed    Spouse name: Not on file   Number of children: 0   Years of education: College   Highest education level: Bachelor's degree (e.g.,  BA, AB, BS)  Occupational History   Occupation: Retired  Tobacco Use   Smoking status: Never   Smokeless tobacco: Never  Vaping Use   Vaping Use: Never used  Substance and Sexual Activity   Alcohol use: No   Drug use: No   Sexual activity: Not on file  Other Topics Concern   Not on file  Social History Narrative   Right handed   No caffeine   One story home   Social Determinants of Health   Financial Resource Strain: Low Risk  (08/07/2021)   Overall Financial Resource Strain (CARDIA)    Difficulty of Paying Living Expenses: Not hard  at all  Food Insecurity: No Food Insecurity (08/07/2021)   Hunger Vital Sign    Worried About Running Out of Food in the Last Year: Never true    Ran Out of Food in the Last Year: Never true  Transportation Needs: No Transportation Needs (08/07/2021)   PRAPARE - Hydrologist (Medical): No    Lack of Transportation (Non-Medical): No  Physical Activity: Insufficiently Active (08/07/2021)   Exercise Vital Sign    Days of Exercise per Week: 2 days    Minutes of Exercise per Session: 40 min  Stress: No Stress Concern Present (08/07/2021)   [place]    Feeling of Stress : Not at all  Social Connections: Moderately Isolated (08/07/2021)   Social Connection and Isolation Panel [NHANES]    Frequency of Communication with Friends and Family: Twice a week    Frequency of Social Gatherings with Friends and Family: Three times a week    Attends Religious Services: More than 4 times per year    Active Member of Clubs or Organizations: No    Attends Archivist Meetings: Never    Marital Status: Widowed     Family History: The patient's family history includes Arrhythmia in her mother; Depression in her sister; Healthy in her brother; Heart attack in her father; Hypertension in her sister; Stroke in her father. There is no history of Breast cancer.  ROS:   Please see the history of present illness.     All other systems reviewed and are negative.  EKGs/Labs/Other Studies Reviewed:    The following studies were reviewed today:   EKG:  EKG is  ordered today.  The ekg ordered 03/19/2021 demonstrates normal sinus rhythm.  Recent Labs: 09/22/2021: TSH 1.126 09/24/2021: Magnesium 2.3 10/06/2021: ALT 16; BUN 33; Creatinine, Ser 1.46; Potassium 4.5; Sodium 140 10/09/2021: Hemoglobin 12.2; Platelets 277  Recent Lipid Panel    Component Value Date/Time   CHOL 176 08/03/2021 1502   TRIG 164 (H)  08/03/2021 1502   HDL 53 08/03/2021 1502   CHOLHDL 3.3 08/03/2021 1502   LDLCALC 95 08/03/2021 1502     Risk Assessment/Calculations:          Physical Exam:    VS:  BP 116/70 (BP Location: Left Arm, Patient Position: Sitting, Cuff Size: Normal)   Pulse 74   Ht 5\' 6"  (1.676 m)   Wt 160 lb (72.6 kg)   SpO2 99%   BMI 25.82 kg/m     Wt Readings from Last 3 Encounters:  11/03/21 160 lb (72.6 kg)  11/02/21 158 lb (71.7 kg)  10/20/21 159 lb (72.1 kg)     GEN:  Well nourished, well developed in no acute distress HEENT: Normal NECK: No JVD; No carotid bruits CARDIAC: RRR, no murmurs,  rubs, gallops RESPIRATORY:  Clear to auscultation without rales, wheezing or rhonchi  ABDOMEN: Soft, non-tender, non-distended MUSCULOSKELETAL:  No edema; No deformity  SKIN: Warm and dry NEUROLOGIC:  Alert and oriented x 3 PSYCHIATRIC:  Normal affect   ASSESSMENT:    1. Syncope and collapse   2. Primary hypertension   3. NSVT (nonsustained ventricular tachycardia) (HCC)   4. Carotid stenosis, asymptomatic, bilateral     PLAN:    In order of problems listed above:  Syncope, appears vasovagal versus orthostatic.  No further episodes.  Echo with EF 60 to 65%, no gross structural abnormalities.  Prior cardiac monitor showed paroxysmal SVT and nonsustained VT lasting up to 14 beats.  Continue atenolol.  Has appointment with EP for ILR consideration if appropriate. Hypertension, BP controlled.  Continue lisinopril 40 mg daily, Norvasc 10 mg daily. NSVT, paroxysmal SVT.  Continue atenolol. Nonobstructive carotid artery disease, follows up with vascular surgery  Follow-up 6 to 12 months    Medication Adjustments/Labs and Tests Ordered: Current medicines are reviewed at length with the patient today.  Concerns regarding medicines are outlined above.  Orders Placed This Encounter  Procedures   EKG 12-Lead    Meds ordered this encounter  Medications   atorvastatin (LIPITOR) 40 MG tablet     Sig: Take 1 tablet (40 mg total) by mouth daily.    Dispense:  90 tablet    Refill:  1     Patient Instructions  Medication Instructions:  Your physician recommends that you continue on your current medications as directed. Please refer to the Current Medication list given to you today.  *If you need a refill on your cardiac medications before your next appointment, please call your pharmacy*   Lab Work: None ordered If you have labs (blood work) drawn today and your tests are completely normal, you will receive your results only by: [place] (if you have MyChart) OR A paper copy in the mail If you have any lab test that is abnormal or we need to change your treatment, we will call you to review the results.   Testing/Procedures: None ordered   Follow-Up: At [facility], you and your health needs are our priority.  As part of our continuing mission to provide you with exceptional heart care, we have created designated Provider Care Teams.  These Care Teams include your primary Cardiologist (physician) and Advanced Practice Providers (APPs -  Physician Assistants and Nurse Practitioners) who all work together to provide you with the care you need, when you need it.  We recommend signing up for the patient portal called "MyChart".  Sign up information is provided on this After Visit Summary.  MyChart is used to connect with patients for Virtual Visits (Telemedicine).  Patients are able to view lab/test results, encounter notes, upcoming appointments, etc.  Non-urgent messages can be sent to your provider as well.   To learn more about what you can do with MyChart, go to [email or web address].    Your next appointment:   6 month(s)  The format for your next appointment:   In Person  Provider:   Kate Sable, MD    Other Instructions   Important Information About Sugar         Signed, Kate Sable, MD  11/03/2021 11:20 AM    [place]

## 2021-11-03 NOTE — Patient Instructions (Signed)
Medication Instructions:  Your physician recommends that you continue on your current medications as directed. Please refer to the Current Medication list given to you today.  *If you need a refill on your cardiac medications before your next appointment, please call your pharmacy*   Lab Work: None ordered If you have labs (blood work) drawn today and your tests are completely normal, you will receive your results only by: MyChart Message (if you have MyChart) OR A paper copy in the mail If you have any lab test that is abnormal or we need to change your treatment, we will call you to review the results.   Testing/Procedures: None ordered   Follow-Up: At [facility], you and your health needs are our priority.  As part of our continuing mission to provide you with exceptional heart care, we have created designated Provider Care Teams.  These Care Teams include your primary Cardiologist (physician) and Advanced Practice Providers (APPs -  Physician Assistants and Nurse Practitioners) who all work together to provide you with the care you need, when you need it.  We recommend signing up for the patient portal called "MyChart".  Sign up information is provided on this After Visit Summary.  MyChart is used to connect with patients for Virtual Visits (Telemedicine).  Patients are able to view lab/test results, encounter notes, upcoming appointments, etc.  Non-urgent messages can be sent to your provider as well.   To learn more about what you can do with MyChart, go to [email or web address].    Your next appointment:   6 month(s)  The format for your next appointment:   In Person  Provider:   Brian Agbor-Etang, MD    Other Instructions   Important Information About Sugar       

## 2021-11-06 ENCOUNTER — Ambulatory Visit (INDEPENDENT_AMBULATORY_CARE_PROVIDER_SITE_OTHER): Payer: Medicare Other | Admitting: Family Medicine

## 2021-11-06 ENCOUNTER — Encounter: Payer: Self-pay | Admitting: Family Medicine

## 2021-11-06 VITALS — BP 125/95 | HR 73 | Resp 18 | Ht 66.0 in | Wt 157.2 lb

## 2021-11-06 DIAGNOSIS — Z9181 History of falling: Secondary | ICD-10-CM

## 2021-11-06 DIAGNOSIS — N1831 Chronic kidney disease, stage 3a: Secondary | ICD-10-CM | POA: Diagnosis not present

## 2021-11-06 DIAGNOSIS — I10 Essential (primary) hypertension: Secondary | ICD-10-CM | POA: Diagnosis not present

## 2021-11-06 NOTE — Assessment & Plan Note (Signed)
Chronic, improved Saw Dr Raliegh Ip last week for labs; and today in office Review of labs from 6/8 indicated: eGFR is now at 50; moving her back to Stage 3a Creatinine is down 0.3 pts, to  as well to 1.15 previously 1.46 Patient is being cognizant of water intake Continue to recommend follow up with nephro

## 2021-11-06 NOTE — Assessment & Plan Note (Signed)
Chronic, plans to connect with PT to work on strength building Denies reoccurrence of falls  Has been seen by vascular of bilateral carotid stenosis and will go back next year for repeat ultrasounds; continue to recommend diet low in saturated fat and regular exercise - 30 min at least 5 times per week The 10-year ASCVD risk score (Arnett DK, et al., 2019) is: 18%   Values used to calculate the score:     Age: 76 years     Sex: Female     Is Non-Hispanic African American: No     Diabetic: No     Tobacco smoker: No     Systolic Blood Pressure: 125 mmHg     Is BP treated: Yes     HDL Cholesterol: 53 mg/dL     Total Cholesterol: 176 mg/dL

## 2021-11-06 NOTE — Assessment & Plan Note (Signed)
Chronic, stable; review of home logs shows BP prior to medication which ranges from  SBP 120s-190s, however, out of 15 readings 11 were >140 mmHg DBP 70s-110s, primarily 90-110s HR 60-90s Date from 5/18-6/12, variable timing from 0830-1930 Denies CP Denies SOB/ DOE Denies low blood pressure/hypotension Denies vision changes No LE Edema noted on exam Continue medication, Norvasc 5 mg, Atenolol 50 mg, Lisinopril 40 mg (2-20 mg)s daily Denies side effects RTC in 3 months  Seek emergent care if you develop chest pain or chest pressure

## 2021-11-07 ENCOUNTER — Telehealth: Payer: Self-pay | Admitting: Physical Therapy

## 2021-11-07 NOTE — Telephone Encounter (Signed)
Called pt to inquiry about whether she would like to come in a day earlier for initial evaluation because of open slots. Pt said she would rather wait until tomorrow to do appointment.

## 2021-11-08 ENCOUNTER — Encounter: Payer: Self-pay | Admitting: Physical Therapy

## 2021-11-08 ENCOUNTER — Other Ambulatory Visit: Payer: Self-pay

## 2021-11-08 ENCOUNTER — Ambulatory Visit: Payer: Medicare Other | Attending: Physician Assistant | Admitting: Physical Therapy

## 2021-11-08 DIAGNOSIS — I951 Orthostatic hypotension: Secondary | ICD-10-CM | POA: Insufficient documentation

## 2021-11-08 DIAGNOSIS — R296 Repeated falls: Secondary | ICD-10-CM | POA: Insufficient documentation

## 2021-11-08 NOTE — Therapy (Signed)
OUTPATIENT PHYSICAL THERAPY LOWER EXTREMITY EVALUATION   Patient Name: Jessica Little MRN: [id number] DOB:[birth year], 76 y.o., female Today's Date: 11/08/2021   PT End of Session - 11/08/21 1520     Visit Number 1    Number of Visits 10    Date for PT Re-Evaluation 01/17/22    Authorization Type Medicare    PT Start Time 1100    PT Stop Time 1145    PT Time Calculation (min) 45 min    Equipment Utilized During Treatment Gait belt    Activity Tolerance Patient tolerated treatment well    Behavior During Therapy WFL for tasks assessed/performed             Past Medical History:  Diagnosis Date   Anxiety    GERD (gastroesophageal reflux disease)    Hyperlipidemia    Hypertension    Past Surgical History:  Procedure Laterality Date   CATARACT EXTRACTION W/PHACO Left 01/18/2021   Procedure: CATARACT EXTRACTION PHACO AND INTRAOCULAR LENS PLACEMENT ([place]) LEFT 7.40 01:11.7;  Surgeon: Leandrew Koyanagi, MD;  Location: [place];  Service: Ophthalmology;  Laterality: Left;   CATARACT EXTRACTION W/PHACO Right 02/01/2021   Procedure: CATARACT EXTRACTION PHACO AND INTRAOCULAR LENS PLACEMENT ([place]) RIGHT EYHANCE TORIC 8.60 01:09.9 ;  Surgeon: Leandrew Koyanagi, MD;  Location: [place];  Service: Ophthalmology;  Laterality: Right;   CHOLECYSTECTOMY     COLONOSCOPY  01/01/2006   COLONOSCOPY WITH PROPOFOL N/A 08/23/2015   Procedure: COLONOSCOPY WITH PROPOFOL;  Surgeon: Robert Bellow, MD;  Location[facility] ENDOSCOPY;  Service: Endoscopy;  Laterality: N/A;   HIATAL HERNIA REPAIR  2019   Patient Active Problem List   Diagnosis Date Noted   Personal history of fall 11/06/2021   Stage 3a chronic kidney disease ([place]) 11/06/2021   Orthostatic hypotension 10/06/2021   Dizziness 10/06/2021   Frequent falls 10/06/2021   Vasovagal syncope 09/29/2021   Carotid stenosis, asymptomatic, bilateral 09/29/2021   Syncope 09/22/2021   Annual physical exam 09/11/2021    Elevated serum creatinine 09/11/2021   Encounter for screening mammogram for malignant neoplasm of breast 09/11/2021   Unexplained weight loss 08/03/2021   Tachycardia with heart rate 100-120 beats per minute 08/03/2021   Lack of access to transportation 07/07/2021   Hypoglycemia 03/28/2021   Primary hypertension 03/28/2021   Syncope and collapse 03/28/2021   Seizure-like activity ([place]) 03/28/2021   Hypokalemia 05/23/2020   GAD (generalized anxiety disorder) 05/25/2018   Osteopenia 10/10/2016   H/O urinary frequency 07/06/2015   HLD (hyperlipidemia) 02/03/2015   Cannot sleep 02/03/2015    PCP: Tally Joe FNP   REFERRING PROVIDER: Junie Panning Mecum PA-C  REFERRING DIAG:  I95.1 (ICD-10-CM) - Orthostatic hypotension R29.6 (ICD-10-CM) - Frequent falls   THERAPY DIAG:  Repeated falls  Rationale for Evaluation and Treatment Rehabilitation  ONSET DATE: 03/18/22  SUBJECTIVE:   SUBJECTIVE STATEMENT: Pt describes having an episode of syncope back in October at church. She was worked and the medical team found a blockage of carotid arteries. Since the initial episode, she has fallen several times at [employer] and at home. She describes that most of falls happen falling backwards and that sometimes she feels light headed but not always.   PERTINENT HISTORY: Per Erin Mecum's note on 10/06/21    Orthostatic hypotension - Primary      Acute, new problem Orthostatics today indicate significant BP changes with position adjustments Recommend she focus on improving fluid volumes and eating regularly  She is taking Norvasc 5 mg,  Atenolol 50 mg and lisinopril 20 mg to control HTN - may need adjustments to this if orthostatics remain an issue Will check CBC and CMP for signs of anemia or electrolyte abnormality that could be causing symptoms Will place referral to PT for assistance with position changes ad fall prevention  Follow up as needed for persistent or progressing symptoms          Relevant Orders   Ambulatory referral to Physical Therapy      Other   Dizziness      Recurrent concern Suspect likely related to orthostatic BP changes  CBC and CMP for rule out  Reviewed ways to improve volume status and improving safety with position changes Follow up as needed for persistent or progressing symptoms         Relevant Orders   CBC w/Diff/Platelet   Comprehensive Metabolic Panel (CMET)   Frequent falls      Chronic and recurrent due to vasovagal syncope and orthostatic hypotension Reviewed ways to improve fluid volume and staying hydrated, eating regular meals and snacks to assist with potential causes Recommend patient make slow, deliberate position changes Recommend she attend physical therapy to assist with assessing gait and balance along with ways to improve this: fall prevention She may need to be evaluated for assistive device to prevent injury Recommend she attend scheduled apts to assist with etiology of dizziness and syncope  PAIN:  Are you having pain? No  PRECAUTIONS: None  WEIGHT BEARING RESTRICTIONS No  FALLS:  Has patient fallen in last 6 months? Yes. Number of falls 3; fell while at [place]: Lives with: lives alone Lives in: House/apartment Stairs: Yes: External: 4 steps; can reach both Has following equipment at home: Single point cane  OCCUPATION: Retired   PLOF: Independent  PATIENT GOALS Strengthen legs to stop falling or figure out what she can do to stop falling    OBJECTIVE:               ORTHOSTATIC VITALS:               Seated BP 162/75 hr 62 SpO2 100              Standing BP    DIAGNOSTIC FINDINGS: N/a   PATIENT SURVEYS:  FOTO 69/100  COGNITION:  Overall cognitive status: Within functional limits for tasks assessed     SENSATION: WFL  EDEMA: N/a    POSTURE: rounded shoulders  PALPATION: N/a  LOWER EXTREMITY ROM: WFL   LOWER EXTREMITY MMT:  MMT Right eval Left eval  Hip  flexion 5 5  Hip extension    Hip abduction    Hip adduction    Hip internal rotation    Hip external rotation    Knee flexion 5 5  Knee extension 5 5  Ankle dorsiflexion 5 5  Ankle plantarflexion    Ankle inversion    Ankle eversion     (Blank rows = not tested)  LOWER EXTREMITY SPECIAL TESTS:  None performed   FUNCTIONAL TESTS:  5 times sit to stand: 14 sec  >=12 sec = falls risk   30 seconds chair stand test 12 reps -Women 70-74 <10 present at risk for falls   DGI: 19/24 <=19/24= predictive of falls in the elderly  Mild Impairment for: Gait with vertical and horizontal head turns, step around obstacles, and steps   10 Meter Walk Test   Gait Speed:    1st trial  9.50 sec , 2nd trial 9.63, Avg=   9.56 sec - Normal Gait speed Avg=  1.05 m/sec   Fast Gait Speed: 1st trial  7.43 sec, 2nd  7.56 sec, Avg= 7.50  sec -Fast Gait speed time= 1.33 m/sec   -> 1 m/sec [employer] and cross street safely and normal WS <1 m/sec Need Intervention to reduce falls risk   - 0.8 - 1.3 m/sec - community ambulator - associated with increased independence in self-care      GAIT: Distance walked: >100 ft  Assistive device utilized: None Level of assistance: Complete Independence Comments: No gait deficits notes other than difficulty sustaining gaze during head turn task for DGI     TODAY'S TREATMENT: Sit to stand 1 x 10  -min VC for fast eccentric and slow concentric    PATIENT EDUCATION:  Education details: form and technique for appropriate exercise  Person educated: Patient Education method: Consulting civil engineer, Demonstration, Verbal cues, and Handouts Education comprehension: verbalized understanding, returned demonstration, and verbal cues required   HOME EXERCISE PROGRAM: Access Code: [email or web address] URL: [email or web address] Date: 11/08/2021 Prepared by: Bradly Chris  Exercises - Sit to Stand  - 1 x daily - 3 x weekly - 3 sets - 10  reps  ASSESSMENT:  CLINICAL IMPRESSION: Patient is a 76 y.o. white elderly female who was seen today for physical therapy evaluation and treatment for repeated falls in the context of light headedness. Orthostatic hypotension not replicated during today's visit. She demonstrates decreased LE strength and decreased dynamic balance that places her at an increased risk for falls. Unable to determine based on exam why patient is falling backwards. She will continue to benefit from skilled PT to improve aforementioned deficits to improve balance and to decrease risk for falls to remain independent.    OBJECTIVE IMPAIRMENTS decreased balance and decreased strength.   ACTIVITY LIMITATIONS carrying, standing, and locomotion level  PARTICIPATION LIMITATIONS: shopping  PERSONAL FACTORS Age and 3+ comorbidities: HTN, Anxiety, and HLD  are also affecting patient's functional outcome.   REHAB POTENTIAL: Good  CLINICAL DECISION MAKING: Stable/uncomplicated  EVALUATION COMPLEXITY: Low   GOALS: Goals reviewed with patient? No  SHORT TERM GOALS: Target date: 11/22/2021  Pt will be independent with HEP in order to improve strength and balance in order to decrease fall risk and improve function at home and work. Baseline: NT  Goal status: INITIAL   LONG TERM GOALS: Target date: 01/03/2022   Patient will have improved function and activity level as evidenced by an increase in FOTO score by 10 points or more.  Baseline: 69/100 Goal status: INITIAL  2. Patient will demonstrate reduced falls risk as evidenced by Dynamic Gait Index (DGI) >19/24 for improved dynamic balance and to decrease risk of falls.  Baseline: 19/24 Goal status: INITIAL  3.  Patient will be able to maintain balance for 30 sec in romberg stance with eyes closed and on foam or condition 4 on MCTSIB to exhibit improved vestibular functioning to improve balance and to decrease falls.  Baseline: NT  Goal status: INITIAL  4. Pt will  improve LE strength as evidenced by performing 5 x STS in <12 sec to decrease risk for falls.           Baseline: 14 sec           Goal status: INITIAL     PLAN: PT FREQUENCY: 1x per week   PT DURATION: 10 weeks  PLANNED INTERVENTIONS: Therapeutic exercises, Therapeutic activity, Neuromuscular re-education, Balance training, Gait  training, Patient/Family education, Joint mobilization, Vestibular training, Manual therapy, and Re-evaluation  PLAN FOR NEXT SESSION: MCTSIB, four stage, and progress balance and strength exercises   Bradly Chris PT, DPT  11/08/2021, 3:22 PM

## 2021-11-09 DIAGNOSIS — N281 Cyst of kidney, acquired: Secondary | ICD-10-CM | POA: Diagnosis not present

## 2021-11-09 DIAGNOSIS — F419 Anxiety disorder, unspecified: Secondary | ICD-10-CM | POA: Diagnosis not present

## 2021-11-09 DIAGNOSIS — F258 Other schizoaffective disorders: Secondary | ICD-10-CM | POA: Diagnosis not present

## 2021-11-09 DIAGNOSIS — R809 Proteinuria, unspecified: Secondary | ICD-10-CM | POA: Diagnosis not present

## 2021-11-15 ENCOUNTER — Ambulatory Visit: Payer: Medicare Other | Admitting: Physical Therapy

## 2021-11-15 ENCOUNTER — Encounter: Payer: Self-pay | Admitting: Physical Therapy

## 2021-11-15 DIAGNOSIS — R296 Repeated falls: Secondary | ICD-10-CM | POA: Diagnosis not present

## 2021-11-15 DIAGNOSIS — I951 Orthostatic hypotension: Secondary | ICD-10-CM | POA: Diagnosis not present

## 2021-11-15 NOTE — Therapy (Signed)
OUTPATIENT PHYSICAL THERAPY TREATMENT NOTE   Patient Name: Jessica Little MRN: [id number] DOB:07-30-1945, 76 y.o., female Today's Date: 11/15/2021  PCP: Tally Joe FNP  REFERRING PROVIDER: Dr. Junie Panning Mecum PA-C   END OF SESSION:   PT End of Session - 11/15/21 1426     Visit Number 2    Number of Visits 10    Date for PT Re-Evaluation 01/17/22    Authorization Type Medicare    PT Start Time 1420    PT Stop Time 1500    PT Time Calculation (min) 40 min    Equipment Utilized During Treatment Gait belt    Activity Tolerance Patient tolerated treatment well    Behavior During Therapy WFL for tasks assessed/performed             Past Medical History:  Diagnosis Date   Anxiety    GERD (gastroesophageal reflux disease)    Hyperlipidemia    Hypertension    Past Surgical History:  Procedure Laterality Date   CATARACT EXTRACTION W/PHACO Left 01/18/2021   Procedure: CATARACT EXTRACTION PHACO AND INTRAOCULAR LENS PLACEMENT ([place]) LEFT 7.40 01:11.7;  Surgeon: Leandrew Koyanagi, MD;  Location: [place];  Service: Ophthalmology;  Laterality: Left;   CATARACT EXTRACTION W/PHACO Right 02/01/2021   Procedure: CATARACT EXTRACTION PHACO AND INTRAOCULAR LENS PLACEMENT ([place]) RIGHT EYHANCE TORIC 8.60 01:09.9 ;  Surgeon: Leandrew Koyanagi, MD;  Location: [place];  Service: Ophthalmology;  Laterality: Right;   CHOLECYSTECTOMY     COLONOSCOPY  01/01/2006   COLONOSCOPY WITH PROPOFOL N/A 08/23/2015   Procedure: COLONOSCOPY WITH PROPOFOL;  Surgeon: Robert Bellow, MD;  [facility] ENDOSCOPY;  Service: Endoscopy;  Laterality: N/A;   HIATAL HERNIA REPAIR  2019   Patient Active Problem List   Diagnosis Date Noted   Personal history of fall 11/06/2021   Stage 3a chronic kidney disease ([place]) 11/06/2021   Orthostatic hypotension 10/06/2021   Dizziness 10/06/2021   Frequent falls 10/06/2021   Vasovagal syncope 09/29/2021   Carotid stenosis, asymptomatic, bilateral  09/29/2021   Syncope 09/22/2021   Annual physical exam 09/11/2021   Elevated serum creatinine 09/11/2021   Encounter for screening mammogram for malignant neoplasm of breast 09/11/2021   Unexplained weight loss 08/03/2021   Tachycardia with heart rate 100-120 beats per minute 08/03/2021   Lack of access to transportation 07/07/2021   Hypoglycemia 03/28/2021   Primary hypertension 03/28/2021   Syncope and collapse 03/28/2021   Seizure-like activity ([place]) 03/28/2021   Hypokalemia 05/23/2020   GAD (generalized anxiety disorder) 05/25/2018   Osteopenia 10/10/2016   H/O urinary frequency 07/06/2015   HLD (hyperlipidemia) 02/03/2015   Cannot sleep 02/03/2015    REFERRING DIAG: I95.1 (ICD-10-CM) - Orthostatic hypotension R29.6 (ICD-10-CM) - Frequent falls  THERAPY DIAG:  Repeated falls  Rationale for Evaluation and Treatment Rehabilitation  PERTINENT HISTORY: Per Erin Mecum's note on 10/06/21              Orthostatic hypotension - Primary                          Acute, new problem Orthostatics today indicate significant BP changes with position adjustments Recommend she focus on improving fluid volumes and eating regularly  She is taking Norvasc 5 mg, Atenolol 50 mg and lisinopril 20 mg to control HTN - may need adjustments to this if orthostatics remain an issue Will check CBC and CMP for signs of anemia or electrolyte abnormality that could be causing symptoms  Will place referral to PT for assistance with position changes ad fall prevention  Follow up as needed for persistent or progressing symptoms                    Relevant Orders             Ambulatory referral to Physical Therapy                          Other             Dizziness                          Recurrent concern Suspect likely related to orthostatic BP changes  CBC and CMP for rule out  Reviewed ways to improve volume status and improving safety with position changes Follow up as needed for persistent or  progressing symptoms                    Relevant Orders             CBC w/Diff/Platelet             Comprehensive Metabolic Panel (CMET)             Frequent falls                          Chronic and recurrent due to vasovagal syncope and orthostatic hypotension Reviewed ways to improve fluid volume and staying hydrated, eating regular meals and snacks to assist with potential causes Recommend patient make slow, deliberate position changes Recommend she attend physical therapy to assist with assessing gait and balance along with ways to improve this: fall prevention She may need to be evaluated for assistive device to prevent injury Recommend she attend scheduled apts to assist with etiology of dizziness and syncope  PRECAUTIONS: None   SUBJECTIVE: Pt reports pain above knees after performing exercises and that she still feels unsteady from time to time after standing up.   PAIN:  Are you having pain? No   OBJECTIVE:                ORTHOSTATIC VITALS:               Seated BP 162/75 hr 62 SpO2 100              Standing BP     DIAGNOSTIC FINDINGS: N/a    PATIENT SURVEYS:  FOTO 69/100   COGNITION:           Overall cognitive status: Within functional limits for tasks assessed                          SENSATION: WFL   EDEMA: N/a      POSTURE: rounded shoulders   PALPATION: N/a   LOWER EXTREMITY ROM: WFL    LOWER EXTREMITY MMT:   MMT Right eval Left eval  Hip flexion 5 5  Hip extension      Hip abduction      Hip adduction      Hip internal rotation      Hip external rotation      Knee flexion 5 5  Knee extension 5 5  Ankle dorsiflexion 5 5  Ankle plantarflexion      Ankle inversion      Ankle eversion       (  Blank rows = not tested)   LOWER EXTREMITY SPECIAL TESTS:  None performed    FUNCTIONAL TESTS:  5 times sit to stand: 14 sec  >=12 sec = falls risk    30 seconds chair stand test 12 reps -Women 70-74 <10 present at risk for falls     DGI: 19/24 <=19/24= predictive of falls in the elderly  Mild Impairment for: Gait with vertical and horizontal head turns, step around obstacles, and steps     10 Meter Walk Test    Gait Speed:    1st trial   9.50 sec , 2nd trial 9.63, Avg=   9.56 sec - Normal Gait speed Avg=  1.05 m/sec   Fast Gait Speed: 1st trial  7.43 sec, 2nd  7.56 sec, Avg= 7.50  sec -Fast Gait speed time= 1.33 m/sec    -> 1 m/sec [employer] and cross street safely and normal WS <1 m/sec Need Intervention to reduce falls risk    - 0.8 - 1.3 m/sec - community ambulator - associated with increased independence in self-care      Clinical Test of Sensory Interaction for Balance    (CTSIB):  CONDITION TIME STRATEGY SWAY  Eyes open, firm surface 30 seconds ankle No Sway   Eyes closed, firm surface 30 seconds ankle No Sway   Eyes open, foam surface 30 seconds ankle Mild   Eyes closed, foam surface 30 seconds hip        Mild/Moderate                        4 Stage Balance: 10 sec except for tandem with RLE back          -Pt at an increased risk for falls if unable to hold tandem for 10 sec                         GAIT: Distance walked: >100 ft  Assistive device utilized: None Level of assistance: Complete Independence Comments: No gait deficits notes other than difficulty sustaining gaze during head turn task for DGI        TODAY'S TREATMENT: 11/15/21    Clinical Test of Sensory Interaction for Balance    (CTSIB):  CONDITION TIME STRATEGY SWAY  Eyes open, firm surface 30 seconds ankle No Sway   Eyes closed, firm surface 30 seconds ankle No Sway   Eyes open, foam surface 30 seconds ankle Mild   Eyes closed, foam surface 30 seconds hip  Mild/Moderate           Tandem Stance 2 x 30 sec         - min VC to maintain arms by side of body           4 Stage Balance: 10 sec except for tandem with RLE back          -Pt at an increased risk for falls if unable to hold tandem for 10 sec         10 m vertical head turns x 10        10 m horz head turns x 10         10 m horz+ vert head turns x 10          -mod VC to maintain gaze until receiving new cue to change direction of head          -Kyphotic posture  Initial  Sit to stand 1 x 10  -min VC for fast eccentric and slow concentric      PATIENT EDUCATION:  Education details: form and technique for appropriate exercise  Person educated: Patient Education method: Explanation, Demonstration, Verbal cues, and Handouts Education comprehension: verbalized understanding, returned demonstration, and verbal cues required     HOME EXERCISE PROGRAM: Access Code: [email or web address] URL: [email or web address] Date: 11/15/2021 Prepared by: Ellin Goodie  Exercises - Sit to Stand  - 1 x daily - 3 x weekly - 3 sets - 10 reps - Tandem Stance  - 1 x daily - 7 x weekly - 1 sets - 5 reps - 10 hold   ASSESSMENT:   CLINICAL IMPRESSION:  Pt exhibits little to no balance deficits with change in sensory input, but does show an increased falls risk with narrowing of base of supports. While pt did improve with dynamic balance with increased reps, she does show difficulty with maintaining gaze either likely from increased neck rigidity. She will benefit from further evaluation of cervical ROM and muscle extensibility to determine deficits and how these could be impacting her dynamic balance. She will continue to benefit from skilled PT to improve aforementioned deficits to improve balance and to decrease risk for falls to remain independent.     OBJECTIVE IMPAIRMENTS decreased balance and decreased strength.    ACTIVITY LIMITATIONS carrying, standing, and locomotion level   PARTICIPATION LIMITATIONS: shopping   PERSONAL FACTORS Age and 3+ comorbidities: HTN, Anxiety, and HLD  are also affecting patient's functional outcome.    REHAB POTENTIAL: Good   CLINICAL DECISION MAKING: Stable/uncomplicated   EVALUATION  COMPLEXITY: Low     GOALS: Goals reviewed with patient? No   SHORT TERM GOALS: Target date: 11/22/2021  Pt will be independent with HEP in order to improve strength and balance in order to decrease fall risk and improve function at home and work. Baseline: NT  Goal status: INITIAL     LONG TERM GOALS: Target date: 01/03/2022    Patient will have improved function and activity level as evidenced by an increase in FOTO score by 10 points or more.  Baseline: 69/100 Goal status: INITIAL   2. Patient will demonstrate reduced falls risk as evidenced by Dynamic Gait Index (DGI) >19/24 for improved dynamic balance and to decrease risk of falls.  Baseline: 19/24 Goal status: INITIAL   3.  Patient will be able to maintain balance for 30 sec in romberg stance with eyes closed and on foam or condition 4 on MCTSIB to exhibit improved vestibular functioning to improve balance and to decrease falls.  Baseline: NT  Goal status: INITIAL   4. Pt will improve LE strength as evidenced by performing 5 x STS in <12 sec to decrease risk for falls.           Baseline: 14 sec           Goal status: INITIAL        PLAN: PT FREQUENCY: 1x per week    PT DURATION: 10 weeks   PLANNED INTERVENTIONS: Therapeutic exercises, Therapeutic activity, Neuromuscular re-education, Balance training, Gait training, Patient/Family education, Joint mobilization, Vestibular training, Manual therapy, and Re-evaluation   PLAN FOR NEXT SESSION: Cervical examination. Progress balance and strength exercises   Ellin Goodie PT, DPT  11/15/2021, 3:06 PM

## 2021-11-21 ENCOUNTER — Ambulatory Visit: Payer: Medicare Other | Admitting: Physical Therapy

## 2021-11-21 NOTE — Therapy (Signed)
PT SCREEN    Pt feeling light headed at beginning of visit upon standing. Pt has seated BP of 117/70 and standing Bps of 88/69 and 91/71 (taken 2 min after first standing BP). Pt elected to defer on doing PT session in favor of rescheduling and going home because of severity of light headedness. Pt asked if she needed help walking to car and she declined. PT instructed pt that this information would be communicated to PCP and any recommendations would be communicated to her via telephone call.

## 2021-11-21 NOTE — Progress Notes (Signed)
Electrophysiology Office Note:    Date:  11/22/2021   ID:  Jessica Little, DOB 12-27-1945, MRN [id number]  PCP:  Jacky Kindle, FNP  [facility] Cardiologist:  None  [facility] Electrophysiologist:  Lanier Prude, MD   Referring MD: Debbe Odea, MD   Chief Complaint: syncope  History of Present Illness:    Jessica Little is a 76 y.o. female who presents for an evaluation of syncope at the request of Dr Azucena Cecil. Their medical history includes HTN, HLD, SVT, CKD, syncope. She saw Dr Azucena Cecil 11/03/2021.   She previously wore a cardiac monitor which was unrevealing.  Today she tells me she has 2 types of passing out.  There is one component that is related to standing and has a clear prodrome.  She has some abdominal symptoms, headache and lightheadedness.  There is a second type of syncope that is more abrupt and lacks a prodrome.  She has had 1 episode while seated at church.  She also another episode while grocery shopping where she fell backwards passed out and had no prodrome.    Past Medical History:  Diagnosis Date   Anxiety    GERD (gastroesophageal reflux disease)    Hyperlipidemia    Hypertension     Past Surgical History:  Procedure Laterality Date   CATARACT EXTRACTION W/PHACO Left 01/18/2021   Procedure: CATARACT EXTRACTION PHACO AND INTRAOCULAR LENS PLACEMENT (IOC) LEFT 7.40 01:11.7;  Surgeon: Lockie Mola, MD;  Location: [facility];  Service: Ophthalmology;  Laterality: Left;   CATARACT EXTRACTION W/PHACO Right 02/01/2021   Procedure: CATARACT EXTRACTION PHACO AND INTRAOCULAR LENS PLACEMENT (IOC) RIGHT EYHANCE TORIC 8.60 01:09.9 ;  Surgeon: Lockie Mola, MD;  Location: [facility];  Service: Ophthalmology;  Laterality: Right;   CHOLECYSTECTOMY     COLONOSCOPY  01/01/2006   COLONOSCOPY WITH PROPOFOL N/A 08/23/2015   Procedure: COLONOSCOPY WITH PROPOFOL;  Surgeon: Earline Mayotte, MD;  Location: [facility];   Service: Endoscopy;  Laterality: N/A;   HIATAL HERNIA REPAIR  2019    Current Medications: Current Meds  Medication Sig   amLODipine (NORVASC) 5 MG tablet Take 1 tablet (5 mg total) by mouth daily.   aspirin EC 81 MG tablet Take 81 mg by mouth daily. Swallow whole.   atenolol (TENORMIN) 50 MG tablet Take 1 tablet (50 mg total) by mouth daily.   atorvastatin (LIPITOR) 40 MG tablet Take 1 tablet (40 mg total) by mouth daily.   Biotin 62952 MCG TABS Take by mouth.   Calcium-Magnesium-Zinc 500-250-12.5 MG TABS Take 1 tablet by mouth daily. Dose unknown   Cholecalciferol (VITAMIN D3 PO) Take by mouth daily.   Flaxseed, Linseed, (FLAXSEED OIL) 1000 MG CAPS Take by mouth.   lisinopril (ZESTRIL) 20 MG tablet Take 2 tablets (40 mg total) by mouth daily.   nortriptyline (PAMELOR) 50 MG capsule Take 1 capsule (50 mg total) by mouth at bedtime.   omeprazole (PRILOSEC) 40 MG capsule Take 1 capsule (40 mg total) by mouth 2 (two) times daily.   perphenazine (TRILAFON) 4 MG tablet TAKE 1 TABLET BY MOUTH AT BEDTIME   potassium chloride (KLOR-CON M) 10 MEQ tablet Take 1 tablet by mouth once daily   vitamin E 1000 UNIT capsule Take 1,000 Units by mouth daily.     Allergies:   Patient has no known allergies.   Social History   Socioeconomic History   Marital status: Widowed    Spouse name: Not on file  Number of children: 0   Years of education: College   Highest education level: Bachelor's degree (e.g., BA, AB, BS)  Occupational History   Occupation: Retired  Tobacco Use   Smoking status: Never   Smokeless tobacco: Never  Vaping Use   Vaping Use: Never used  Substance and Sexual Activity   Alcohol use: No   Drug use: No   Sexual activity: Not on file  Other Topics Concern   Not on file  Social History Narrative   Right handed   No caffeine   One story home   Social Determinants of Health   Financial Resource Strain: Low Risk  (08/07/2021)   Overall Financial Resource Strain  (CARDIA)    Difficulty of Paying Living Expenses: Not hard at all  Food Insecurity: No Food Insecurity (08/07/2021)   Hunger Vital Sign    Worried About Running Out of Food in the Last Year: Never true    Ran Out of Food in the Last Year: Never true  Transportation Needs: No Transportation Needs (08/07/2021)   PRAPARE - Administrator, Civil Service (Medical): No    Lack of Transportation (Non-Medical): No  Physical Activity: Insufficiently Active (08/07/2021)   Exercise Vital Sign    Days of Exercise per Week: 2 days    Minutes of Exercise per Session: 40 min  Stress: No Stress Concern Present (08/07/2021)   [employer] of Occupational Health - Occupational Stress Questionnaire    Feeling of Stress : Not at all  Social Connections: Moderately Isolated (08/07/2021)   Social Connection and Isolation Panel [NHANES]    Frequency of Communication with Friends and Family: Twice a week    Frequency of Social Gatherings with Friends and Family: Three times a week    Attends Religious Services: More than 4 times per year    Active Member of Clubs or Organizations: No    Attends Banker Meetings: Never    Marital Status: Widowed     Family History: The patient's family history includes Arrhythmia in her mother; Depression in her sister; Healthy in her brother; Heart attack in her father; Hypertension in her sister; Stroke in her father. There is no history of Breast cancer.  ROS:   Please see the history of present illness.    All other systems reviewed and are negative.  EKGs/Labs/Other Studies Reviewed:    The following studies were reviewed today:  04/17/2021 [facility] Patient had a min HR of 47 bpm, max HR of 176 bpm, and avg HR of 71 bpm. Predominant underlying rhythm was Sinus Rhythm. 12 Ventricular Tachycardia runs occurred, the run with the fastest interval lasting 14 beats with a max rate of 176 bpm (avg 149  bpm); the run with the fastest interval was also  the longest. 17 Supraventricular Tachycardia runs occurred, the run with the fastest interval lasting 4 beats with a max rate of 164 bpm, the longest lasting 20 beats with an avg rate of 119 bpm. Isolated  SVEs were rare (<1.0%), SVE Couplets were rare (<1.0%), and SVE Triplets were rare (<1.0%). Isolated VEs were rare (<1.0%, 868), VE Couplets were rare (<1.0%, 27), and VE Triplets were rare (<1.0%, 5).      Recent Labs: 09/22/2021: TSH 1.126 09/24/2021: Magnesium 2.3 10/06/2021: ALT 16; BUN 33; Creatinine, Ser 1.46; Potassium 4.5; Sodium 140 10/09/2021: Hemoglobin 12.2; Platelets 277  Recent Lipid Panel    Component Value Date/Time   CHOL 176 08/03/2021 1502   TRIG 164 (H)  08/03/2021 1502   HDL 53 08/03/2021 1502   CHOLHDL 3.3 08/03/2021 1502   LDLCALC 95 08/03/2021 1502    Physical Exam:    VS:  BP 128/84   Pulse 78   Ht 5\' 6"  (1.676 m)   Wt 158 lb (71.7 kg)   SpO2 100%   BMI 25.50 kg/m     Orthostatic vitals Flat 121/78, 74 Sitting 119/79, 77 Standing 95/63, 82 3-minute standing 116/76, 84    Wt Readings from Last 3 Encounters:  11/22/21 158 lb (71.7 kg)  11/06/21 157 lb 3.2 oz (71.3 kg)  11/03/21 160 lb (72.6 kg)     GEN:  Well nourished, well developed in no acute distress HEENT: Normal NECK: No JVD; No carotid bruits LYMPHATICS: No lymphadenopathy CARDIAC: RRR, no murmurs, rubs, gallops RESPIRATORY:  Clear to auscultation without rales, wheezing or rhonchi  ABDOMEN: Soft, non-tender, non-distended MUSCULOSKELETAL:  No edema; No deformity  SKIN: Warm and dry NEUROLOGIC:  Alert and oriented x 3 PSYCHIATRIC:  Normal affect       ASSESSMENT:    1. Syncope and collapse   2. Primary hypertension   3. NSVT (nonsustained ventricular tachycardia) (HCC)    PLAN:    In order of problems listed above:   #Syncope She has 2 different types of syncopal episodes.  Some have a prodrome and are consistent with orthostatic hypotension and syncope.  A second  form of syncope has no prodrome and is occurred while seated.  This is concerning for an arrhythmic cause of syncope.  Heart rhythm monitoring has been unrevealing so far.  I recommended a loop recorder implant for longer-term heart rhythm surveillance.  I discussed the loop recorder implant procedure in detail with the patient including the risks and monthly monitoring costs and she wishes to proceed.  I recommended compression stockings for the orthostatic component.  I recommended that she stay adequately hydrated.  She should take all changes in position slowly to avoid abrupt decreases in blood pressure.  #Hypertension At goal.  Continue current medical therapy.    Medication Adjustments/Labs and Tests Ordered: Current medicines are reviewed at length with the patient today.  Concerns regarding medicines are outlined above.  No orders of the defined types were placed in this encounter.  No orders of the defined types were placed in this encounter.    Signed, 01/03/22. Rossie Muskrat, MD, [facility], [facility] 11/22/2021 12:12 PM    Electrophysiology [facility]  ----------------------------   SURGEON:  11/24/2021, MD    PREPROCEDURE DIAGNOSIS:  Syncope    POSTPROCEDURE DIAGNOSIS:  Syncope     PROCEDURES:   1. Implantable loop recorder implantation    INTRODUCTION:  Ms Vanburen is a 76 y.o. patient with syncope who presents today for implantable loop implantation.      DESCRIPTION OF PROCEDURE:  Informed written consent was obtained.  The patient required no sedation for the procedure today.   The patients left chest was therefore prepped and draped in the usual sterile fashion. The skin overlying the left parasternal region was infiltrated with lidocaine for local analgesia.  A 0.5-cm incision was made over the left parasternal region over the 3rd intercostal space.  A [employer] Lux DX [id number]) implantable loop recorder was then placed into the pocket  R  waves were very prominent and measured >0.58mV.  Steri- Strips and a sterile dressing were then applied.  There were no early apparent complications.     CONCLUSIONS:   1.  Successful implantation of a [employer] Lux DX implantable loop recorder for syncope.  2. No early apparent complications.   Sheria Lang T. Lalla Brothers, MD, [facility] Cardiac Electrophysiology

## 2021-11-22 ENCOUNTER — Ambulatory Visit (INDEPENDENT_AMBULATORY_CARE_PROVIDER_SITE_OTHER): Payer: Medicare Other | Admitting: Cardiology

## 2021-11-22 ENCOUNTER — Encounter: Payer: Self-pay | Admitting: Cardiology

## 2021-11-22 VITALS — BP 128/84 | HR 78 | Ht 66.0 in | Wt 158.0 lb

## 2021-11-22 DIAGNOSIS — I10 Essential (primary) hypertension: Secondary | ICD-10-CM

## 2021-11-22 DIAGNOSIS — R55 Syncope and collapse: Secondary | ICD-10-CM | POA: Diagnosis not present

## 2021-11-22 DIAGNOSIS — I4729 Other ventricular tachycardia: Secondary | ICD-10-CM | POA: Diagnosis not present

## 2021-11-22 NOTE — Patient Instructions (Addendum)
Medication Instructions:  Your physician recommends that you continue on your current medications as directed. Please refer to the Current Medication list given to you today.  Labwork: None ordered.  Testing/Procedures: None ordered.  Follow-Up:  Your physician wants you to follow-up in: As needed in person with Dr. Lambert. We will follow your device remotely.     Implantable Loop Recorder Placement, Care After This sheet gives you information about how to care for yourself after your procedure. Your health care provider may also give you more specific instructions. If you have problems or questions, contact your health care provider. What can I expect after the procedure? After the procedure, it is common to have: Soreness or discomfort near the incision. Some swelling or bruising near the incision.  Follow these instructions at home: Incision care  Monitor your cardiac device site for redness, swelling, and drainage. Call the device clinic at [phone] if you experience these symptoms or fever/chills.  Keep the large square bandage on your site for 24 hours and then you may remove it yourself. Keep the steri-strips underneath in place.   You may shower after 72 hours / 3 days from your procedure with the steri-strips in place. They will usually fall off on their own, or may be removed after 10 days. Pat dry.   Avoid lotions, ointments, or perfumes over your incision until it is well-healed.  Please do not submerge in water until your site is completely healed.   Your device is MRI compatible.   Remote monitoring is used to monitor your cardiac device from home. This monitoring is scheduled every month by our office. It allows us to keep an eye on the function of your device to ensure it is working properly.  If your wound site starts to bleed apply pressure.      If you have any questions/concerns please call the device clinic at [phone].  Activity  Return to  your normal activities.  General instructions Follow instructions from your health care provider about how to manage your implantable loop recorder and transmit the information. Learn how to activate a recording if this is necessary for your type of device. You may go through a metal detection gate, and you may let someone hold a metal detector over your chest. Show your ID card if needed. Do not have an MRI unless you check with your health care provider first. Take over-the-counter and prescription medicines only as told by your health care provider. Keep all follow-up visits as told by your health care provider. This is important. Contact a health care provider if: You have redness, swelling, or pain around your incision. You have a fever. You have pain that is not relieved by your pain medicine. You have triggered your device because of fainting (syncope) or because of a heartbeat that feels like it is racing, slow, fluttering, or skipping (palpitations). Get help right away if you have: Chest pain. Difficulty breathing. Summary After the procedure, it is common to have soreness or discomfort near the incision. Change your dressing as told by your health care provider. Follow instructions from your health care provider about how to manage your implantable loop recorder and transmit the information. Keep all follow-up visits as told by your health care provider. This is important. This information is not intended to replace advice given to you by your health care provider. Make sure you discuss any questions you have with your health care provider. Document Released: 04/25/2015 Document Revised: 06/29/2017 Document Reviewed:   06/29/2017 Elsevier Patient Education  2020 Elsevier Inc.  

## 2021-11-23 ENCOUNTER — Encounter: Payer: Medicare Other | Admitting: Physical Therapy

## 2021-11-23 ENCOUNTER — Telehealth: Payer: Self-pay

## 2021-11-23 NOTE — Telephone Encounter (Signed)
Successful telephone encounter to patient to discuss her concern for mild skin redness under Tegaderm bandage coving loop implant. No other complaints. Patient is advised to remove large outer dressing at 1 pm today and will reassess area once bandage is removed. Will call patient back today after 1 pm. Patient appreciative of follow up.

## 2021-11-23 NOTE — Telephone Encounter (Signed)
The patient left a voicemail that her incision site is tender and have red marks under the dressing (steri strips). She wants to know if she should be concerned? She would like for the nurse to give her a call back.

## 2021-11-23 NOTE — Telephone Encounter (Signed)
Successful telephone encounter to patient to follow up on redness under clear bandage covering loop implant site. Patient has remove the dressing as instructed, leaving steri strips intact. Patient states skin remains slightly reddened however improved. Patient will continue to monitor.

## 2021-11-27 ENCOUNTER — Encounter: Payer: Medicare Other | Admitting: Physical Therapy

## 2021-11-27 ENCOUNTER — Ambulatory Visit: Payer: Self-pay | Admitting: *Deleted

## 2021-11-27 NOTE — Telephone Encounter (Signed)
  Chief Complaint: Left eyelid swollen with a knot/bump on it.   Draining clear fluid from eye not the bump. Symptoms: A bump on eyelid.  Does not itch or hurt but is a little tender when touched Frequency: She doesn't know how long it's been there.   At least several days because she thought it would go away. Pertinent Negatives: Patient denies blurry vision, itching or pain, no burning of her eye. Disposition: [] ED /[] Urgent Care (no appt availability in office) / [x] Appointment(In office/virtual)/ []  [place] [facility]/ [] Home Care/ [] Refused Recommended Disposition /[] [place] [facility]/ []  Follow-up with PCP Additional Notes: Appt made for 11/29/2021 at 11:00 with , FNP.

## 2021-11-27 NOTE — Telephone Encounter (Signed)
Reason for Disposition  Eyelid is red and painful (or tender to touch)  Answer Assessment - Initial Assessment Questions 1. EYE DISCHARGE: "Is the discharge in one or both eyes?" "What color is it?" "How much is there?" "When did the discharge start?"      Left eyelid is swollen on edge.   It looks like a pimple on my eyelid. 2. REDNESS OF SCLERA: "Is the redness in one or both eyes?" "When did the redness start?"      White drainage with puffy swelling.   3. EYELIDS: "Are the eyelids red or swollen?" If Yes, ask: "How much?"      Yes my eyelid is swollen  4. VISION: "Is there any difficulty seeing clearly?"      No 5. PAIN: "Is there any pain? If Yes, ask: "How bad is it?" (Scale 1-10; or mild, moderate, severe)    - MILD (1-3): doesn't interfere with normal activities     - MODERATE (4-7): interferes with normal activities or awakens from sleep    - SEVERE (8-10): excruciating pain, unable to do any normal activities       No pain 6. CONTACT LENS: "Do you wear contacts?"     N/A 7. OTHER SYMPTOMS: "Do you have any other symptoms?" (e.g., fever, runny nose, cough)     Clear drainage and swelling of eyelid.   8. PREGNANCY: "Is there any chance you are pregnant?" "When was your last menstrual period?"  Protocols used: Eye - Pus or Discharge-A-AH

## 2021-11-29 ENCOUNTER — Encounter: Payer: Self-pay | Admitting: Family Medicine

## 2021-11-29 ENCOUNTER — Ambulatory Visit (INDEPENDENT_AMBULATORY_CARE_PROVIDER_SITE_OTHER): Payer: Medicare Other | Admitting: Family Medicine

## 2021-11-29 VITALS — BP 153/93 | HR 79 | Temp 98.4°F | Resp 17 | Wt 162.0 lb

## 2021-11-29 DIAGNOSIS — H0014 Chalazion left upper eyelid: Secondary | ICD-10-CM | POA: Diagnosis not present

## 2021-11-29 MED ORDER — ERYTHROMYCIN 5 MG/GM OP OINT: 1.0000 | TOPICAL_OINTMENT | Freq: Every day | OPHTHALMIC | 0 refills | Status: DC

## 2021-11-29 NOTE — Progress Notes (Signed)
Established patient visit   Patient: Jessica Little   DOB: 20-Jan-1946   76 y.o. Female  MRN: [id number] Visit Date: 11/29/2021  Today's healthcare provider: Jacky Kindle, FNP  Introduced to nurse practitioner role and practice setting.  All questions answered.  Discussed provider/patient relationship and expectations.   I,Tiffany J Bragg,acting as a scribe for Jacky Kindle, FNP.,have documented all relevant documentation on the behalf of Jacky Kindle, FNP,as directed by  Jacky Kindle, FNP while in the presence of Jacky Kindle, FNP.   Chief Complaint  Patient presents with   Eye Problem    Patient complains of L eye redness and swelling for 5 days.    Subjective    HPI HPI     Eye Problem    Additional comments: Patient complains of L eye redness and swelling for 5 days.       Last edited by Marlana Salvage, CMA on 11/29/2021 11:03 AM.       Medications: Outpatient Medications Prior to Visit  Medication Sig   amLODipine (NORVASC) 5 MG tablet Take 1 tablet (5 mg total) by mouth daily.   aspirin EC 81 MG tablet Take 81 mg by mouth daily. Swallow whole.   atenolol (TENORMIN) 50 MG tablet Take 1 tablet (50 mg total) by mouth daily.   atorvastatin (LIPITOR) 40 MG tablet Take 1 tablet (40 mg total) by mouth daily.   Biotin 22025 MCG TABS Take by mouth.   Calcium-Magnesium-Zinc 500-250-12.5 MG TABS Take 1 tablet by mouth daily. Dose unknown   Cholecalciferol (VITAMIN D3 PO) Take by mouth daily.   Flaxseed, Linseed, (FLAXSEED OIL) 1000 MG CAPS Take by mouth.   lisinopril (ZESTRIL) 20 MG tablet Take 2 tablets (40 mg total) by mouth daily.   nortriptyline (PAMELOR) 50 MG capsule Take 1 capsule (50 mg total) by mouth at bedtime.   omeprazole (PRILOSEC) 40 MG capsule Take 1 capsule (40 mg total) by mouth 2 (two) times daily.   perphenazine (TRILAFON) 4 MG tablet TAKE 1 TABLET BY MOUTH AT BEDTIME   potassium chloride (KLOR-CON M) 10 MEQ tablet Take 1 tablet by mouth once  daily   vitamin E 1000 UNIT capsule Take 1,000 Units by mouth daily.   No facility-administered medications prior to visit.    Review of Systems     Objective    BP (!) 153/93 (BP Location: Right Arm, Patient Position: Sitting, Cuff Size: Normal)   Pulse 79   Temp 98.4 F (36.9 C) (Oral)   Resp 17   Wt 162 lb (73.5 kg)   SpO2 100%   BMI 26.15 kg/m    Physical Exam Vitals and nursing note reviewed.  Constitutional:      General: She is not in acute distress.    Appearance: Normal appearance. She is overweight. She is not ill-appearing, toxic-appearing or diaphoretic.  HENT:     Head: Normocephalic and atraumatic.  Eyes:      Comments: Chalazion noted on upper interior eye lid   Cardiovascular:     Rate and Rhythm: Normal rate and regular rhythm.     Pulses: Normal pulses.     Heart sounds: Normal heart sounds. No murmur heard.    No friction rub. No gallop.  Pulmonary:     Effort: Pulmonary effort is normal. No respiratory distress.     Breath sounds: Normal breath sounds. No stridor. No wheezing, rhonchi or rales.  Chest:     Chest  wall: No tenderness.  Abdominal:     General: Bowel sounds are normal.     Palpations: Abdomen is soft.  Musculoskeletal:        General: No swelling, tenderness, deformity or signs of injury. Normal range of motion.     Right lower leg: No edema.     Left lower leg: No edema.  Skin:    General: Skin is warm and dry.     Capillary Refill: Capillary refill takes less than 2 seconds.     Coloration: Skin is not jaundiced or pale.     Findings: No bruising, erythema, lesion or rash.  Neurological:     General: No focal deficit present.     Mental Status: She is alert and oriented to person, place, and time. Mental status is at baseline.     Cranial Nerves: No cranial nerve deficit.     Sensory: No sensory deficit.     Motor: No weakness.     Coordination: Coordination normal.  Psychiatric:        Mood and Affect: Mood normal.         Behavior: Behavior normal.        Thought Content: Thought content normal.        Judgment: Judgment normal.      No results found for any visits on 11/29/21.  Assessment & Plan     Problem List Items Addressed This Visit       Musculoskeletal and Integument   Chalazion of left upper eyelid - Primary    Acute, no signs of cellulitis Continue to use warm compresses Once nightly topical eye antibiotic to assist Report back if symptoms worse or vision changes Printed AVS with additional information provided       Relevant Medications   erythromycin ophthalmic ointment     Return if symptoms worsen or fail to improve.      Leilani Merl, FNP, have reviewed all documentation for this visit. The documentation on 11/29/21 for the exam, diagnosis, procedures, and orders are all accurate and complete.    Jacky Kindle, FNP  [facility] [phone] (phone) [phone] (fax)  [facility]

## 2021-11-29 NOTE — Assessment & Plan Note (Signed)
Acute, no signs of cellulitis Continue to use warm compresses Once nightly topical eye antibiotic to assist Report back if symptoms worse or vision changes Printed AVS with additional information provided

## 2021-11-29 NOTE — Patient Instructions (Signed)
Please let us know if eye gets worse

## 2021-12-04 ENCOUNTER — Encounter: Payer: Self-pay | Admitting: Physical Therapy

## 2021-12-04 ENCOUNTER — Ambulatory Visit: Payer: Medicare Other | Attending: Physician Assistant | Admitting: Physical Therapy

## 2021-12-04 DIAGNOSIS — R296 Repeated falls: Secondary | ICD-10-CM

## 2021-12-04 NOTE — Therapy (Signed)
OUTPATIENT PHYSICAL THERAPY TREATMENT NOTE   Patient Name: SHENEIKA MERLIN MRN: [id number] DOB:Aug 14, 1945, 76 y.o., female Today's Date: 12/04/2021  PCP: Tally Joe FNP  REFERRING PROVIDER: Dr. Junie Panning Mecum PA-C   END OF SESSION:   PT End of Session - 12/04/21 1511     Visit Number 3    Number of Visits 10    Date for PT Re-Evaluation 01/17/22    Authorization Type Medicare    PT Start Time 1505    PT Stop Time 1545    PT Time Calculation (min) 40 min    Equipment Utilized During Treatment Gait belt    Activity Tolerance Patient tolerated treatment well    Behavior During Therapy WFL for tasks assessed/performed              Past Medical History:  Diagnosis Date   Anxiety    GERD (gastroesophageal reflux disease)    Hyperlipidemia    Hypertension    Past Surgical History:  Procedure Laterality Date   CATARACT EXTRACTION W/PHACO Left 01/18/2021   Procedure: CATARACT EXTRACTION PHACO AND INTRAOCULAR LENS PLACEMENT ([place]) LEFT 7.40 01:11.7;  Surgeon: Leandrew Koyanagi, MD;  Location: [place];  Service: Ophthalmology;  Laterality: Left;   CATARACT EXTRACTION W/PHACO Right 02/01/2021   Procedure: CATARACT EXTRACTION PHACO AND INTRAOCULAR LENS PLACEMENT ([place]) RIGHT EYHANCE TORIC 8.60 01:09.9 ;  Surgeon: Leandrew Koyanagi, MD;  Location: [place];  Service: Ophthalmology;  Laterality: Right;   CHOLECYSTECTOMY     COLONOSCOPY  01/01/2006   COLONOSCOPY WITH PROPOFOL N/A 08/23/2015   Procedure: COLONOSCOPY WITH PROPOFOL;  Surgeon: Robert Bellow, MD;  Location: [facility];  Service: Endoscopy;  Laterality: N/A;   HIATAL HERNIA REPAIR  2019   Patient Active Problem List   Diagnosis Date Noted   [place]al[place]ion of left upper eyelid 11/29/2021   Personal history of fall 11/06/2021   Stage 3a chronic kidney disease ([place]) 11/06/2021   Orthostatic hypotension 10/06/2021   Dizziness 10/06/2021   Frequent falls 10/06/2021   Vasovagal syncope 09/29/2021    Carotid stenosis, asymptomatic, bilateral 09/29/2021   Syncope 09/22/2021   Annual physical exam 09/11/2021   Elevated serum creatinine 09/11/2021   Encounter for screening mammogram for malignant neoplasm of breast 09/11/2021   Unexplained weight loss 08/03/2021   Tachycardia with heart rate 100-120 beats per minute 08/03/2021   Lack of access to transportation 07/07/2021   Hypoglycemia 03/28/2021   Primary hypertension 03/28/2021   Syncope and collapse 03/28/2021   Seizure-like activity ([place]) 03/28/2021   Hypokalemia 05/23/2020   GAD (generalized anxiety disorder) 05/25/2018   Osteopenia 10/10/2016   H/O urinary frequency 07/06/2015   HLD (hyperlipidemia) 02/03/2015   Cannot sleep 02/03/2015    REFERRING DIAG: I95.1 (ICD-10-CM) - Orthostatic hypotension R29.6 (ICD-10-CM) - Frequent falls  THERAPY DIAG:  Repeated falls  Rationale for Evaluation and Treatment Rehabilitation  PERTINENT HISTORY: Per Erin Mecum's note on 10/06/21              Orthostatic hypotension - Primary                          Acute, new problem Orthostatics today indicate significant BP changes with position adjustments Recommend she focus on improving fluid volumes and eating regularly  She is taking Norvasc 5 mg, Atenolol 50 mg and lisinopril 20 mg to control HTN - may need adjustments to this if orthostatics remain an issue Will check CBC and CMP for signs of  anemia or electrolyte abnormality that could be causing symptoms Will place referral to PT for assistance with position changes ad fall prevention  Follow up as needed for persistent or progressing symptoms                    Relevant Orders             Ambulatory referral to Physical Therapy                          Other             Dizziness                          Recurrent concern Suspect likely related to orthostatic BP changes  CBC and CMP for rule out  Reviewed ways to improve volume status and improving safety with position  changes Follow up as needed for persistent or progressing symptoms                    Relevant Orders             CBC w/Diff/Platelet             Comprehensive Metabolic Panel (CMET)             Frequent falls                          Chronic and recurrent due to vasovagal syncope and orthostatic hypotension Reviewed ways to improve fluid volume and staying hydrated, eating regular meals and snacks to assist with potential causes Recommend patient make slow, deliberate position changes Recommend she attend physical therapy to assist with assessing gait and balance along with ways to improve this: fall prevention She may need to be evaluated for assistive device to prevent injury Recommend she attend scheduled apts to assist with etiology of dizziness and syncope  PRECAUTIONS: None   SUBJECTIVE: Pt states that she has not had any falls since last session and that she has experienced no syncopal episodes.   PAIN:  Are you having pain? No   OBJECTIVE:                ORTHOSTATIC VITALS:               Seated BP 162/75 hr 62 SpO2 100              Standing BP     DIAGNOSTIC FINDINGS: N/a    PATIENT SURVEYS:  FOTO 69/100   COGNITION:           Overall cognitive status: Within functional limits for tasks assessed                          SENSATION: WFL   EDEMA: N/a      POSTURE: rounded shoulders   PALPATION: N/a   LOWER EXTREMITY ROM: WFL    LOWER EXTREMITY MMT:   MMT Right eval Left eval  Hip flexion 5 5  Hip extension      Hip abduction      Hip adduction      Hip internal rotation      Hip external rotation      Knee flexion 5 5  Knee extension 5 5  Ankle dorsiflexion 5 5  Ankle plantarflexion      Ankle  inversion      Ankle eversion       (Blank rows = not tested)   LOWER EXTREMITY SPECIAL TESTS:  None performed    FUNCTIONAL TESTS:  5 times sit to stand: 14 sec  >=12 sec = falls risk    30 seconds chair stand test 12 reps -Women 70-74 <10  present at risk for falls    DGI: 19/24 <=19/24= predictive of falls in the elderly  Mild Impairment for: Gait with vertical and horizontal head turns, step around obstacles, and steps     10 Meter Walk Test    Gait Speed:    1st trial   9.50 sec , 2nd trial 9.63, Avg=   9.56 sec - Normal Gait speed Avg=  1.05 m/sec   Fast Gait Speed: 1st trial  7.43 sec, 2nd  7.56 sec, Avg= 7.50  sec -Fast Gait speed time= 1.33 m/sec    -> 1 m/sec [employer] and cross street safely and normal WS <1 m/sec Need Intervention to reduce falls risk    - 0.8 - 1.3 m/sec - community ambulator - associated with increased independence in self-care      Clinical Test of Sensory Interaction for Balance    (CTSIB):  CONDITION TIME STRATEGY SWAY  Eyes open, firm surface 30 seconds ankle No Sway   Eyes closed, firm surface 30 seconds ankle No Sway   Eyes open, foam surface 30 seconds ankle Mild   Eyes closed, foam surface 30 seconds hip        Mild/Moderate                        4 Stage Balance: 10 sec except for tandem with RLE back          -Pt at an increased risk for falls if unable to hold tandem for 10 sec                         GAIT: Distance walked: >100 ft  Assistive device utilized: None Level of assistance: Complete Independence Comments: No gait deficits notes other than difficulty sustaining gaze during head turn task for DGI        TODAY'S TREATMENT:  12/04/21   THEREX   Nu-Step Seat and Arm 7 for resistance at 2 -5 min  Sit to Stand 1 x 10  Sit to Stand with #8 DB 2 x 10  -min VC to decrease eccentric portion of exercise  Standing Calf Raises 1 x 10  Standing Calf Raises with #8 DB 3 x 10  Standing Hip Abduction with 1 UE support 3 x 10    NMR  Tandem with arms out to side 5 x 10 sec hold  Tandem with arms held close to side 3 x 10 sec hold    -Intermittent stepping strategy    11/15/21   Clinical Test of Sensory Interaction for Balance     (CTSIB):  CONDITION TIME STRATEGY SWAY  Eyes open, firm surface 30 seconds ankle No Sway   Eyes closed, firm surface 30 seconds ankle No Sway   Eyes open, foam surface 30 seconds ankle Mild   Eyes closed, foam surface 30 seconds hip  Mild/Moderate           Tandem Stance 2 x 30 sec         - min VC to maintain arms by side of body  4 Stage Balance: 10 sec except for tandem with RLE back          -Pt at an increased risk for falls if unable to hold tandem for 10 sec        10 m vertical head turns x 10        10 m horz head turns x 10         10 m horz+ vert head turns x 10          -mod VC to maintain gaze until receiving new cue to change direction of head          -Kyphotic posture               Initial  Sit to stand 1 x 10  -min VC for fast eccentric and slow concentric      PATIENT EDUCATION:  Education details: form and technique for appropriate exercise  Person educated: Patient Education method: Explanation, Demonstration, Verbal cues, and Handouts Education comprehension: verbalized understanding, returned demonstration, and verbal cues required     HOME EXERCISE PROGRAM: Access Code: [email or web address] URL: [email or web address] Date: 12/04/2021 Prepared by: Ellin Goodie  Exercises - Sit to Stand  - 1 x daily - 3 x weekly - 3 sets - 10 reps - Tandem Stance  - 1 x daily - 7 x weekly - 1 sets - 5 reps - 10 hold - Forward Monster Walks  - 1 x daily - 3 x weekly - 1 sets - 10 reps - Standing Heel Raise  - 1 x daily - 3 x weekly - 3 sets - 10 reps   ASSESSMENT:   CLINICAL IMPRESSION:  Pt exhibits improved static balance and LE strength with ability to perform standing balance exercises and sit to stands with increased resistance and without provocation of light headedness. She requires mod VC to decrease speed of exercises especially the eccentric portion of the exercise. Hip abduction exercise modified to monster walk given difficulty in  controlling speed with eccentric portion while standing. She will benefit from further evaluation of cervical ROM and muscle extensibility to determine deficits and how these could be impacting her dynamic balance. She will continue to benefit from skilled PT to improve aforementioned deficits to improve balance and to decrease risk for falls to remain independent.      OBJECTIVE IMPAIRMENTS decreased balance and decreased strength.    ACTIVITY LIMITATIONS carrying, standing, and locomotion level   PARTICIPATION LIMITATIONS: shopping   PERSONAL FACTORS Age and 3+ comorbidities: HTN, Anxiety, and HLD  are also affecting patient's functional outcome.    REHAB POTENTIAL: Good   CLINICAL DECISION MAKING: Stable/uncomplicated   EVALUATION COMPLEXITY: Low     GOALS: Goals reviewed with patient? No   SHORT TERM GOALS: Target date: 11/22/2021  Pt will be independent with HEP in order to improve strength and balance in order to decrease fall risk and improve function at home and work. Baseline: NT  Goal status: ONGOING      LONG TERM GOALS: Target date: 01/03/2022    Patient will have improved function and activity level as evidenced by an increase in FOTO score by 10 points or more.  Baseline: 69/100 Goal status: ONGOING    2. Patient will demonstrate reduced falls risk as evidenced by Dynamic Gait Index (DGI) >19/24 for improved dynamic balance and to decrease risk of falls.  Baseline: 19/24 Goal status: ONGOING    3.  Patient will be able to maintain balance for  30 sec in romberg stance with eyes closed and on foam or condition 4 on MCTSIB to exhibit improved vestibular functioning to improve balance and to decrease falls.  Baseline: NT  Goal status: Deferred    4. Pt will improve LE strength as evidenced by performing 5 x STS in <12 sec to decrease risk for falls.           Baseline: 14 sec           Goal status: ONGOING        PLAN: PT FREQUENCY: 1x per week    PT  DURATION: 10 weeks   PLANNED INTERVENTIONS: Therapeutic exercises, Therapeutic activity, Neuromuscular re-education, Balance training, Gait training, Patient/Family education, Joint mobilization, Vestibular training, Manual therapy, and Re-evaluation   PLAN FOR NEXT SESSION: Cervical examination. Progress balance and strength exercises   Bradly Chris PT, DPT  12/04/2021, 3:12 PM

## 2021-12-05 ENCOUNTER — Encounter: Payer: Medicare Other | Admitting: Physical Therapy

## 2021-12-12 ENCOUNTER — Ambulatory Visit (INDEPENDENT_AMBULATORY_CARE_PROVIDER_SITE_OTHER): Payer: Medicare Other | Admitting: Neurology

## 2021-12-12 ENCOUNTER — Encounter: Payer: Self-pay | Admitting: Neurology

## 2021-12-12 VITALS — BP 121/74 | HR 69 | Ht 66.0 in | Wt 162.0 lb

## 2021-12-12 DIAGNOSIS — R55 Syncope and collapse: Secondary | ICD-10-CM

## 2021-12-12 NOTE — Patient Instructions (Signed)
Good to see you doing well. Continue to keep hydrated, monitor BP, and follow-up with Cardiology. Follow-up with me as needed, call for any changes.

## 2021-12-12 NOTE — Progress Notes (Signed)
NEUROLOGY FOLLOW UP OFFICE NOTE  Jessica Little [id number] Oct 19, 1945  HISTORY OF PRESENT ILLNESS: I had the pleasure of seeing Jessica Little in follow-up in the neurology clinic on 12/12/2021.  The patient was last seen 5 months ago after she had 2 episodes of loss of consciousness that occurred in 02/2021 and 05/2021. Records and images were personally reviewed where available.  I personally reviewed brain MRI/MRA done 07/2021 which did not show any acute changes. There was moderately advanced cerebral atrophy with chronic microvascular disease, mild to moderate short-segment stenosis at the origin of the left M1 segment, atherosclerosis throughout the MCA and PCA distal small branches bilaterally. Her 1-hour and 24-hour ambulatory EEG studies were normal, typical events were not captured. On her follow-up visit with Cardiology last month, she reported 2 different types of syncopal episodes, some have a prodrome of feeling hot, nausea, but others have no warning and occurred while sitting.   Today she states that she has not had any complete passing out since June 17, 2021. On review of records, she was admitted to [facility] on 09/22/2021 after an episode of loss of consciousness while she was in a grocery checkout line. There was no prodrome with this episode. She was noted to have orthostasis with labile BP. She had an ILR placed at the end of June and states she has not had any further episodes since having the monitor. She feels lightheaded when standing, after staying still for a bit, she feels better. She is doing physical therapy for her balance and muscle strength, and denies any falls. She denies any staring/unresponsive episodes, gaps in time, olfactory/gustatory hallucinations, focal numbness/tingling/weakness, myoclonic jerks. No headaches, vision changes. Sleep is good.    History on Initial Assessment 07/05/2021: This is a 76 year old right-handed woman with a history of hypertension,  hyperlipidemia, depression, presenting for evaluation of loss of consciousness with shaking. The first episode occurred in 02/2021 while she was sitting at church. She felt fine that morning, then while at church, she suddenly felt nauseated and flushed. She had a slight headache, felt hot and was fanning herself, then woke up on the ground. She does not remember much but was told she was shaking/having jerking motions on the ground and hit her head. She was evaluated by Cardiology and had a normal echocardiogram. She had a holter monitor which showed paroxymal SVT and nonsustained SVT, no arrhythmias. Patient triggered events showed sinus rhythm. She had another syncopal episode on 06/17/2021 again while sitting at church. She had a sick stomach, felt nauseated, then woke up on the floor. No tongue bite or incontinence. She denied feeling dizzy/lightheaded. She had been sitting most of the morning. She had eaten breakfast that day. No alcohol or sleep deprivation. Since the first episode, there are times where her head hurts in the back "like a heaviness" that she can't hold her head up lasting 15 minutes but states there is no pain. She has had a similar "sick stomach" feeling in 2019 and was diagnosed with a hiatal hernia and symptoms improved after surgery. She denies any staring/unresponsive episodes, olfactory/gustatory hallucinations, deja vu, rising epigastric sensation, focal numbness/tingling/weakness, myoclonic jerks. She denies any other gaps in time but "falls asleep when tired."Sometimes while driving she notices a little vision problem where she has to concentrate because her vision bothers her. No diplopia, dysarthria/dysphagia. She pulled her right shoulder muscle this morning. She has occasional diarrhea. She lives alone. She gets 5-6 hours of sleep. Memory is  good. She is on nortriptyline and perphenazine for depression, no recent medication changes. She recalls her sister fainted before due to low  BP and potassium. She had a normal birth and early development.  There is no history of febrile convulsions, CNS infections such as meningitis/encephalitis, significant traumatic brain injury, neurosurgical procedures, or family history of seizures.   PAST MEDICAL HISTORY: Past Medical History:  Diagnosis Date   Anxiety    GERD (gastroesophageal reflux disease)    Hyperlipidemia    Hypertension     MEDICATIONS: Current Outpatient Medications on File Prior to Visit  Medication Sig Dispense Refill   amLODipine (NORVASC) 5 MG tablet Take 1 tablet (5 mg total) by mouth daily. 90 tablet 1   aspirin EC 81 MG tablet Take 81 mg by mouth daily. Swallow whole.     atenolol (TENORMIN) 50 MG tablet Take 1 tablet (50 mg total) by mouth daily. 90 tablet 1   atorvastatin (LIPITOR) 40 MG tablet Take 1 tablet (40 mg total) by mouth daily. 90 tablet 1   Biotin 53299 MCG TABS Take by mouth.     Calcium-Magnesium-Zinc 500-250-12.5 MG TABS Take 1 tablet by mouth daily. Dose unknown     Cholecalciferol (VITAMIN D3 PO) Take by mouth daily.     erythromycin ophthalmic ointment Place 1 Application into the left eye at bedtime. 1 cm thin ribbon 3.5 g 0   Flaxseed, Linseed, (FLAXSEED OIL) 1000 MG CAPS Take by mouth.     lisinopril (ZESTRIL) 20 MG tablet Take 2 tablets (40 mg total) by mouth daily. 180 tablet 1   nortriptyline (PAMELOR) 50 MG capsule Take 1 capsule (50 mg total) by mouth at bedtime. 90 capsule 3   omeprazole (PRILOSEC) 40 MG capsule Take 1 capsule (40 mg total) by mouth 2 (two) times daily. 180 capsule 1   perphenazine (TRILAFON) 4 MG tablet TAKE 1 TABLET BY MOUTH AT BEDTIME 90 tablet 3   potassium chloride (KLOR-CON M) 10 MEQ tablet Take 1 tablet by mouth once daily 90 tablet 0   vitamin E 1000 UNIT capsule Take 1,000 Units by mouth daily.     No current facility-administered medications on file prior to visit.    ALLERGIES: No Known Allergies  FAMILY HISTORY: Family History  Problem  Relation Age of Onset   Arrhythmia Mother    Heart attack Father    Stroke Father    Depression Sister    Hypertension Sister    Healthy Brother    Breast cancer Neg Hx     SOCIAL HISTORY: Social History   Socioeconomic History   Marital status: Widowed    Spouse name: Not on file   Number of children: 0   Years of education: College   Highest education level: Bachelor's degree (e.g., BA, AB, BS)  Occupational History   Occupation: Retired  Tobacco Use   Smoking status: Never   Smokeless tobacco: Never  Vaping Use   Vaping Use: Never used  Substance and Sexual Activity   Alcohol use: No   Drug use: No   Sexual activity: Not on file  Other Topics Concern   Not on file  Social History Narrative   Right handed   No caffeine   One story home   Social Determinants of Health   Financial Resource Strain: Low Risk  (08/07/2021)   Overall Financial Resource Strain (CARDIA)    Difficulty of Paying Living Expenses: Not hard at all  Food Insecurity: No Food Insecurity (08/07/2021)  Hunger Vital Sign    Worried About Running Out of Food in the Last Year: Never true    Ran Out of Food in the Last Year: Never true  Transportation Needs: No Transportation Needs (08/07/2021)   PRAPARE - Hydrologist (Medical): No    Lack of Transportation (Non-Medical): No  Physical Activity: Insufficiently Active (08/07/2021)   Exercise Vital Sign    Days of Exercise per Week: 2 days    Minutes of Exercise per Session: 40 min  Stress: No Stress Concern Present (08/07/2021)   [place]    Feeling of Stress : Not at all  Social Connections: Moderately Isolated (08/07/2021)   Social Connection and Isolation Panel [NHANES]    Frequency of Communication with Friends and Family: Twice a week    Frequency of Social Gatherings with Friends and Family: Three times a week    Attends Religious Services: More  than 4 times per year    Active Member of Clubs or Organizations: No    Attends Archivist Meetings: Never    Marital Status: Widowed  Intimate Partner Violence: Not At Risk (08/07/2021)   Humiliation, Afraid, Rape, and Kick questionnaire    Fear of Current or Ex-Partner: No    Emotionally Abused: No    Physically Abused: No    Sexually Abused: No     PHYSICAL EXAM: Vitals:   12/12/21 1416  BP: 121/74  Pulse: 69  SpO2: 96%   General: No acute distress Head:  Normocephalic/atraumatic Skin/Extremities: No rash, no edema Neurological Exam: alert and awake. No aphasia or dysarthria. Fund of knowledge is appropriate. Attention and concentration are normal.   Cranial nerves: Pupils equal, round. Extraocular movements intact with no nystagmus. Visual fields full.  No facial asymmetry.  Motor: Bulk and tone normal, muscle strength 5/5 throughout with no pronator drift.   Finger to nose testing intact.  Gait slightly wide-based, no ataxia   IMPRESSION: This is a 76 yo RH woman with a history of hypertension, hyperlipidemia, depression, with episodes of loss of consciousness, some in the setting of orthostatic hypotension, but she has had an episode with no prodrome. Her brain MRI/MRA did not show any abnormalities to cause syncope, 24-hour EEG normal. No clear neurological cause of symptoms. She continues to follow closely with Cardiology and now has an ILR. Discussed continued hydration, monitoring BP. She is anxious to resume driving, I discussed [place] driving laws, she was advised to speak with her Cardiologist as well. Follow-up as needed, she knows to call for any changes.     Thank you for allowing me to participate in her care.  Please do not hesitate to call for any questions or concerns.    Ellouise Newer, M.D.   CC: Jessica Joe, FNP

## 2021-12-14 ENCOUNTER — Encounter: Payer: Self-pay | Admitting: Physical Therapy

## 2021-12-14 ENCOUNTER — Ambulatory Visit: Payer: Medicare Other | Admitting: Physical Therapy

## 2021-12-14 ENCOUNTER — Encounter: Payer: Medicare Other | Admitting: Physical Therapy

## 2021-12-14 DIAGNOSIS — R296 Repeated falls: Secondary | ICD-10-CM

## 2021-12-14 NOTE — Therapy (Addendum)
OUTPATIENT PHYSICAL THERAPY TREATMENT NOTE   Patient Name: Jessica Little MRN: [id number] DOB:[birth year], 76 y.o., female Today's Date: 12/14/2021  PCP: Tally Joe FNP  REFERRING PROVIDER: Dr. Junie Panning Mecum PA-C   END OF SESSION:   PT End of Session - 12/14/21 1103     Visit Number 4    Number of Visits 10    Date for PT Re-Evaluation 01/17/22    Authorization Type Medicare    PT Start Time 1100    PT Stop Time 1130    PT Time Calculation (min) 30 min    Equipment Utilized During Treatment Gait belt    Activity Tolerance Patient tolerated treatment well    Behavior During Therapy WFL for tasks assessed/performed              Past Medical History:  Diagnosis Date   Anxiety    GERD (gastroesophageal reflux disease)    Hyperlipidemia    Hypertension    Past Surgical History:  Procedure Laterality Date   CATARACT EXTRACTION W/PHACO Left 01/18/2021   Procedure: CATARACT EXTRACTION PHACO AND INTRAOCULAR LENS PLACEMENT ([place]) LEFT 7.40 01:11.7;  Surgeon: Leandrew Koyanagi, MD;  Location: [facility];  Service: Ophthalmology;  Laterality: Left;   CATARACT EXTRACTION W/PHACO Right 02/01/2021   Procedure: CATARACT EXTRACTION PHACO AND INTRAOCULAR LENS PLACEMENT ([place]) RIGHT EYHANCE TORIC 8.60 01:09.9 ;  Surgeon: Leandrew Koyanagi, MD;  Location: [facility];  Service: Ophthalmology;  Laterality: Right;   CHOLECYSTECTOMY     COLONOSCOPY  01/01/2006   COLONOSCOPY WITH PROPOFOL N/A 08/23/2015   Procedure: COLONOSCOPY WITH PROPOFOL;  Surgeon: Robert Bellow, MD;  Location: [facility];  Service: Endoscopy;  Laterality: N/A;   heart monitor Right 10/22/2021   HIATAL HERNIA REPAIR  2019   Patient Active Problem List   Diagnosis Date Noted   [place] of left upper eyelid 11/29/2021   Personal history of fall 11/06/2021   Stage 3a chronic kidney disease ([place]) 11/06/2021   Orthostatic hypotension 10/06/2021   Dizziness 10/06/2021   Frequent falls  10/06/2021   Vasovagal syncope 09/29/2021   Carotid stenosis, asymptomatic, bilateral 09/29/2021   Syncope 09/22/2021   Annual physical exam 09/11/2021   Elevated serum creatinine 09/11/2021   Encounter for screening mammogram for malignant neoplasm of breast 09/11/2021   Unexplained weight loss 08/03/2021   Tachycardia with heart rate 100-120 beats per minute 08/03/2021   Lack of access to transportation 07/07/2021   Hypoglycemia 03/28/2021   Primary hypertension 03/28/2021   Syncope and collapse 03/28/2021   Seizure-like activity ([place]) 03/28/2021   Hypokalemia 05/23/2020   GAD (generalized anxiety disorder) 05/25/2018   Osteopenia 10/10/2016   H/O urinary frequency 07/06/2015   HLD (hyperlipidemia) 02/03/2015   Cannot sleep 02/03/2015    REFERRING DIAG: I95.1 (ICD-10-CM) - Orthostatic hypotension R29.6 (ICD-10-CM) - Frequent falls  THERAPY DIAG:  Repeated falls  Rationale for Evaluation and Treatment Rehabilitation  PERTINENT HISTORY: Per Erin Mecum's note on 10/06/21              Orthostatic hypotension - Primary                          Acute, new problem Orthostatics today indicate significant BP changes with position adjustments Recommend she focus on improving fluid volumes and eating regularly  She is taking Norvasc 5 mg, Atenolol 50 mg and lisinopril 20 mg to control HTN - may need adjustments to this if orthostatics remain an issue Will check  CBC and CMP for signs of anemia or electrolyte abnormality that could be causing symptoms Will place referral to PT for assistance with position changes ad fall prevention  Follow up as needed for persistent or progressing symptoms                    Relevant Orders             Ambulatory referral to Physical Therapy                          Other             Dizziness                          Recurrent concern Suspect likely related to orthostatic BP changes  CBC and CMP for rule out  Reviewed ways to improve volume  status and improving safety with position changes Follow up as needed for persistent or progressing symptoms                    Relevant Orders             CBC w/Diff/Platelet             Comprehensive Metabolic Panel (CMET)             Frequent falls                          Chronic and recurrent due to vasovagal syncope and orthostatic hypotension Reviewed ways to improve fluid volume and staying hydrated, eating regular meals and snacks to assist with potential causes Recommend patient make slow, deliberate position changes Recommend she attend physical therapy to assist with assessing gait and balance along with ways to improve this: fall prevention She may need to be evaluated for assistive device to prevent injury Recommend she attend scheduled apts to assist with etiology of dizziness and syncope  PRECAUTIONS: None   SUBJECTIVE: Pt reports no light headedness or falls since last session.   PAIN:  Are you having pain? No   OBJECTIVE:                ORTHOSTATIC VITALS:               Seated BP 162/75 hr 62 SpO2 100              Standing BP     DIAGNOSTIC FINDINGS: N/a    PATIENT SURVEYS:  FOTO 69/100   COGNITION:           Overall cognitive status: Within functional limits for tasks assessed                          SENSATION: WFL   EDEMA: N/a      POSTURE: rounded shoulders   PALPATION: N/a   LOWER EXTREMITY ROM: WFL    LOWER EXTREMITY MMT:   MMT Right eval Left eval  Hip flexion 5 5  Hip extension      Hip abduction      Hip adduction      Hip internal rotation      Hip external rotation      Knee flexion 5 5  Knee extension 5 5  Ankle dorsiflexion 5 5  Ankle plantarflexion      Ankle inversion  Ankle eversion       (Blank rows = not tested)   LOWER EXTREMITY SPECIAL TESTS:  None performed    FUNCTIONAL TESTS:  5 times sit to stand: 14 sec  12/14/21: 13 sec  >=12 sec = falls risk    30 seconds chair stand test 12 reps -Women  70-74 <10 present at risk for falls    DGI: 19/24 12/14/21: 23/24  <=19/24= predictive of falls in the elderly  Mild Impairment for: Gait with vertical and horizontal head turns, step around obstacles, and steps     10 Meter Walk Test    Gait Speed:    1st trial   9.50 sec , 2nd trial 9.63, Avg=   9.56 sec - Normal Gait speed Avg=  1.05 m/sec   Fast Gait Speed: 1st trial  7.43 sec, 2nd  7.56 sec, Avg= 7.50  sec -Fast Gait speed time= 1.33 m/sec    -> 1 m/sec [employer] and cross street safely and normal WS <1 m/sec Need Intervention to reduce falls risk    - 0.8 - 1.3 m/sec - community ambulator - associated with increased independence in self-care      Clinical Test of Sensory Interaction for Balance    (CTSIB):  CONDITION TIME STRATEGY SWAY  Eyes open, firm surface 30 seconds ankle No Sway   Eyes closed, firm surface 30 seconds ankle No Sway   Eyes open, foam surface 30 seconds ankle Mild   Eyes closed, foam surface 30 seconds hip        Mild/Moderate                        4 Stage Balance: 10 sec except for tandem with RLE back          -Pt at an increased risk for falls if unable to hold tandem for 10 sec                         GAIT: Distance walked: >100 ft  Assistive device utilized: None Level of assistance: Complete Independence Comments: No gait deficits notes other than difficulty sustaining gaze during head turn task for DGI        TODAY'S TREATMENT:  12/14/21  Nu-Step Level 2 resistance and level 7 for seat and arms 7 for 6 min   DGI: 23/24 -Mild Impairment: Increased lateral sway with horizontal head turns   5 x STS: 13 sec   FOTO score: 84  Mini-Squats 3 x 10 with BUE support  -min VC to shift weight posteriorly onto heels   12/04/21   THEREX   Nu-Step Seat and Arm 7 for resistance at 2 -5 min  Sit to Stand 1 x 10  Sit to Stand with #8 DB 2 x 10  -min VC to decrease eccentric portion of exercise  Standing Calf Raises 1 x 10   Standing Calf Raises with #8 DB 3 x 10  Standing Hip Abduction with 1 UE support 3 x 10    NMR  Tandem with arms out to side 5 x 10 sec hold  Tandem with arms held close to side 3 x 10 sec hold    -Intermittent stepping strategy    11/15/21   Clinical Test of Sensory Interaction for Balance    (CTSIB):  CONDITION TIME STRATEGY SWAY  Eyes open, firm surface 30 seconds ankle No Sway   Eyes closed, firm surface 30 seconds ankle  No Sway   Eyes open, foam surface 30 seconds ankle Mild   Eyes closed, foam surface 30 seconds hip  Mild/Moderate           Tandem Stance 2 x 30 sec         - min VC to maintain arms by side of body           4 Stage Balance: 10 sec except for tandem with RLE back          -Pt at an increased risk for falls if unable to hold tandem for 10 sec        10 m vertical head turns x 10        10 m horz head turns x 10         10 m horz+ vert head turns x 10          -mod VC to maintain gaze until receiving new cue to change direction of head          -Kyphotic posture               Initial  Sit to stand 1 x 10  -min VC for fast eccentric and slow concentric      PATIENT EDUCATION:  Education details: form and technique for appropriate exercise  Person educated: Patient Education method: Explanation, Demonstration, Verbal cues, and Handouts Education comprehension: verbalized understanding, returned demonstration, and verbal cues required     HOME EXERCISE PROGRAM: Access Code: [email or web address] URL: [email or web address] Date: 12/04/2021 Prepared by: Bradly Chris  Exercises - Sit to Stand  - 1 x daily - 3 x weekly - 3 sets - 10 reps - Tandem Stance  - 1 x daily - 7 x weekly - 1 sets - 5 reps - 10 hold - Forward Monster Walks  - 1 x daily - 3 x weekly - 1 sets - 10 reps - Standing Heel Raise  - 1 x daily - 3 x weekly - 3 sets - 10 reps   ASSESSMENT:   CLINICAL IMPRESSION:  Pt has reached nearly all her PT goals with improved dynamic  balance score with ability to negotiate stairs without UE support and perform head turns with a smoother gait. Despite improving 5 x STS, pt's speed is limited because she fears triggering light headedness with rapid position change, so goal deferred for now. Her FOTO self-perception of function has improved since her initial evaluation and she is able to independently pursue HEP to maintain functional gains. Modification to hip extension strengthening exercise with addition of mini-squats if she is unable to perform sit to stand.        OBJECTIVE IMPAIRMENTS decreased balance and decreased strength.    ACTIVITY LIMITATIONS carrying, standing, and locomotion level   PARTICIPATION LIMITATIONS: shopping   PERSONAL FACTORS Age and 3+ comorbidities: HTN, Anxiety, and HLD  are also affecting patient's functional outcome.    REHAB POTENTIAL: Good   CLINICAL DECISION MAKING: Stable/uncomplicated   EVALUATION COMPLEXITY: Low     GOALS: Goals reviewed with patient? No   SHORT TERM GOALS: Target date: 11/22/2021  Pt will be independent with HEP in order to improve strength and balance in order to decrease fall risk and improve function at home and work. Baseline: Able to perform independently  Goal status: ACHIEVED       LONG TERM GOALS: Target date: 01/03/2022    Patient will have improved function and activity level as evidenced by an increase in [place]  score by 10 points or more.  Baseline: 69/100 12/14/21: 84 Goal status: ACHIEVED    2. Patient will demonstrate reduced falls risk as evidenced by Dynamic Gait Index (DGI) >19/24 for improved dynamic balance and to decrease risk of falls.  Baseline: 19/24   12/14/21 23/24  Goal status: ACHIEVED    3.  Patient will be able to maintain balance for 30 sec in romberg stance with eyes closed and on foam or condition 4 on MCTSIB to exhibit improved vestibular functioning to improve balance and to decrease falls.  Baseline: NT  Goal status:  Deferred    4. Pt will improve LE strength as evidenced by performing 5 x STS in <12 sec to decrease risk for falls.           Baseline: 14 sec  12/14/21: 13 sec; did not go as fast because she is concerned about triggering light headedness           Goal status: Partially met       PLAN: PT FREQUENCY: 1x per week    PT DURATION: 10 weeks   PLANNED INTERVENTIONS: Therapeutic exercises, Therapeutic activity, Neuromuscular re-education, Balance training, Gait training, Patient/Family education, Joint mobilization, Vestibular training, Manual therapy, and Re-evaluation   PLAN FOR NEXT SESSION:  Discharge from [facility], DPT  12/14/2021, 5:28 PM

## 2021-12-19 ENCOUNTER — Encounter: Payer: Medicare Other | Admitting: Physical Therapy

## 2021-12-20 ENCOUNTER — Encounter: Payer: Medicare Other | Admitting: Physical Therapy

## 2021-12-25 ENCOUNTER — Ambulatory Visit (INDEPENDENT_AMBULATORY_CARE_PROVIDER_SITE_OTHER): Payer: Medicare Other

## 2021-12-25 DIAGNOSIS — R55 Syncope and collapse: Secondary | ICD-10-CM | POA: Diagnosis not present

## 2021-12-26 LAB — CUP PACEART REMOTE DEVICE CHECK
Date Time Interrogation Session: 20230801074910
Implantable Pulse Generator Implant Date: 20230628
Pulse Gen Serial Number: 177431

## 2021-12-27 ENCOUNTER — Encounter: Payer: Medicare Other | Admitting: Physical Therapy

## 2022-01-02 ENCOUNTER — Encounter: Payer: Medicare Other | Admitting: Physical Therapy

## 2022-01-15 ENCOUNTER — Other Ambulatory Visit: Payer: Self-pay | Admitting: Family Medicine

## 2022-01-15 DIAGNOSIS — E876 Hypokalemia: Secondary | ICD-10-CM

## 2022-01-25 ENCOUNTER — Ambulatory Visit (INDEPENDENT_AMBULATORY_CARE_PROVIDER_SITE_OTHER): Payer: Medicare Other

## 2022-01-25 DIAGNOSIS — R55 Syncope and collapse: Secondary | ICD-10-CM | POA: Diagnosis not present

## 2022-01-25 LAB — CUP PACEART REMOTE DEVICE CHECK
Date Time Interrogation Session: 20230831062152
Implantable Pulse Generator Implant Date: 20230628
Pulse Gen Serial Number: 177431

## 2022-01-28 NOTE — Progress Notes (Signed)
[place] [facility]  

## 2022-02-02 NOTE — Progress Notes (Unsigned)
Established patient visit   Patient: Jessica Little   DOB: 17-Apr-1946   76 y.o. Female  MRN: [id number] Visit Date: 02/07/2022  Today's healthcare provider: Gwyneth Sprout, FNP  Re Introduced to nurse practitioner role and practice setting.  All questions answered.  Discussed provider/patient relationship and expectations.  I,Jessica Little J Jessica Little,acting as a scribe for Gwyneth Sprout, FNP.,have documented all relevant documentation on the behalf of Gwyneth Sprout, FNP,as directed by  Gwyneth Sprout, FNP while in the presence of Gwyneth Sprout, FNP.   Chief Complaint  Patient presents with   Hypertension   Subjective    HPI  Hypertension, follow-up  BP Readings from Last 3 Encounters:  02/07/22 128/79  12/12/21 121/74  11/29/21 (!) 153/93   Wt Readings from Last 3 Encounters:  02/07/22 167 lb (75.8 kg)  12/12/21 162 lb (73.5 kg)  11/29/21 162 lb (73.5 kg)     She was last seen for hypertension 3 months ago.  BP at that visit was 125/95. Management since that visit includes Norvasc 5 mg, Atenolol 50 mg, Lisinopril 40 mg.  She reports excellent compliance with treatment. She is not having side effects.  She is following a Regular diet. She is exercising. She does not smoke.  Use of agents associated with hypertension: none.   Outside blood pressures are around 160/90 before taking meds. Symptoms: No chest pain No chest pressure  No palpitations No syncope  No dyspnea No orthopnea  No paroxysmal nocturnal dyspnea No lower extremity edema   Pertinent labs Lab Results  Component Value Date   CHOL 176 08/03/2021   HDL 53 08/03/2021   LDLCALC 95 08/03/2021   TRIG 164 (H) 08/03/2021   CHOLHDL 3.3 08/03/2021   Lab Results  Component Value Date   NA 140 10/06/2021   K 4.5 10/06/2021   CREATININE 1.46 (H) 10/06/2021   EGFR 38 (L) 10/06/2021   GLUCOSE 82 10/06/2021   TSH 1.126 09/22/2021     The 10-year ASCVD risk score (Arnett DK, et al., 2019) is:  18.7%  ---------------------------------------------------------------------------------------------------   Medications: Outpatient Medications Prior to Visit  Medication Sig   aspirin EC 81 MG tablet Take 81 mg by mouth daily. Swallow whole.   atorvastatin (LIPITOR) 40 MG tablet Take 1 tablet (40 mg total) by mouth daily.   Biotin 10000 MCG TABS Take by mouth.   Calcium-Magnesium-Zinc 500-250-12.5 MG TABS Take 1 tablet by mouth daily. Dose unknown   Cholecalciferol (VITAMIN D3 PO) Take by mouth daily.   erythromycin ophthalmic ointment Place 1 Application into the left eye at bedtime. 1 cm thin ribbon   Flaxseed, Linseed, (FLAXSEED OIL) 1000 MG CAPS Take by mouth.   nortriptyline (PAMELOR) 50 MG capsule Take 1 capsule (50 mg total) by mouth at bedtime.   perphenazine (TRILAFON) 4 MG tablet TAKE 1 TABLET BY MOUTH AT BEDTIME   vitamin E 1000 UNIT capsule Take 1,000 Units by mouth daily.   [DISCONTINUED] amLODipine (NORVASC) 5 MG tablet Take 1 tablet (5 mg total) by mouth daily.   [DISCONTINUED] atenolol (TENORMIN) 50 MG tablet Take 1 tablet (50 mg total) by mouth daily.   [DISCONTINUED] lisinopril (ZESTRIL) 20 MG tablet Take 2 tablets (40 mg total) by mouth daily.   [DISCONTINUED] omeprazole (PRILOSEC) 40 MG capsule Take 1 capsule (40 mg total) by mouth 2 (two) times daily.   [DISCONTINUED] potassium chloride (KLOR-CON M) 10 MEQ tablet Take 1 tablet by mouth once daily  No facility-administered medications prior to visit.    Review of Systems  Last CBC Lab Results  Component Value Date   WBC 7.3 10/09/2021   HGB 12.2 10/09/2021   HCT 37.5 10/09/2021   MCV 81 10/09/2021   MCH 26.5 (L) 10/09/2021   RDW 15.5 (H) 10/09/2021   PLT 277 09/38/1829   Last metabolic panel Lab Results  Component Value Date   GLUCOSE 82 10/06/2021   NA 140 10/06/2021   K 4.5 10/06/2021   CL 105 10/06/2021   CO2 19 (L) 10/06/2021   BUN 33 (H) 10/06/2021   CREATININE 1.46 (H) 10/06/2021   EGFR 38  (L) 10/06/2021   CALCIUM 9.5 10/06/2021   PHOS 3.3 09/24/2021   PROT 7.2 10/06/2021   ALBUMIN 4.5 10/06/2021   LABGLOB 2.7 10/06/2021   AGRATIO 1.7 10/06/2021   BILITOT 0.5 10/06/2021   ALKPHOS 83 10/06/2021   AST 24 10/06/2021   ALT 16 10/06/2021   ANIONGAP 6 09/24/2021   Last lipids Lab Results  Component Value Date   CHOL 176 08/03/2021   HDL 53 08/03/2021   LDLCALC 95 08/03/2021   TRIG 164 (H) 08/03/2021   CHOLHDL 3.3 08/03/2021   Last hemoglobin A1c Lab Results  Component Value Date   HGBA1C 5.6 09/22/2021   Last thyroid functions Lab Results  Component Value Date   TSH 1.126 09/22/2021       Objective    BP 128/79 (BP Location: Left Arm, Patient Position: Sitting, Cuff Size: Normal)   Pulse 62   Resp 16   Ht $R'5\' 6"'cg$  (1.676 m)   Wt 167 lb (75.8 kg)   SpO2 98%   BMI 26.95 kg/m   BP Readings from Last 3 Encounters:  02/07/22 128/79  12/12/21 121/74  11/29/21 (!) 153/93   Wt Readings from Last 3 Encounters:  02/07/22 167 lb (75.8 kg)  12/12/21 162 lb (73.5 kg)  11/29/21 162 lb (73.5 kg)   SpO2 Readings from Last 3 Encounters:  02/07/22 98%  12/12/21 96%  11/29/21 100%      Physical Exam Vitals and nursing note reviewed.  Constitutional:      General: She is not in acute distress.    Appearance: Normal appearance. She is overweight. She is not ill-appearing, toxic-appearing or diaphoretic.  HENT:     Head: Normocephalic and atraumatic.  Cardiovascular:     Rate and Rhythm: Normal rate and regular rhythm.     Pulses: Normal pulses.     Heart sounds: Normal heart sounds. No murmur heard.    No friction rub. No gallop.  Pulmonary:     Effort: Pulmonary effort is normal. No respiratory distress.     Breath sounds: Normal breath sounds. No stridor. No wheezing, rhonchi or rales.  Chest:     Chest wall: No tenderness.  Abdominal:     General: Bowel sounds are normal.     Palpations: Abdomen is soft.  Musculoskeletal:        General: Swelling  present. No tenderness, deformity or signs of injury. Normal range of motion.     Right lower leg: 1+ Edema present.     Left lower leg: 1+ Edema present.  Skin:    General: Skin is warm and dry.     Capillary Refill: Capillary refill takes less than 2 seconds.     Coloration: Skin is not jaundiced or pale.     Findings: No bruising, erythema, lesion or rash.  Neurological:     General:  No focal deficit present.     Mental Status: She is alert and oriented to person, place, and time. Mental status is at baseline.     Cranial Nerves: No cranial nerve deficit.     Sensory: No sensory deficit.     Motor: No weakness.     Coordination: Coordination normal.  Psychiatric:        Mood and Affect: Mood normal.        Behavior: Behavior normal.        Thought Content: Thought content normal.        Judgment: Judgment normal.      No results found for any visits on 02/07/22.  Assessment & Plan     Problem List Items Addressed This Visit       Cardiovascular and Mediastinum   Carotid stenosis, asymptomatic, bilateral    Chronic, stable Continue to monitor lipids; LDL previously stable <100; however, triglycerides were elevated Currently taking lipitor 40; followed by cards       Relevant Medications   amLODipine (NORVASC) 5 MG tablet   atenolol (TENORMIN) 50 MG tablet   lisinopril (ZESTRIL) 20 MG tablet   Other Relevant Orders   Lipid panel   Primary hypertension    Chronic, stable in office Variability in home numbers; however, reassuring that stable today in office and patient has no negative reports/side effects from medication use +1 LE edema on BLE; continue to monitor sodium intake and water intake to assist in addition to use of compression hose and elevation Continue norvasc 5 mg Atenolol 50 mg Lisinopril 20 mg      Relevant Medications   amLODipine (NORVASC) 5 MG tablet   atenolol (TENORMIN) 50 MG tablet   lisinopril (ZESTRIL) 20 MG tablet     Digestive    Gastroesophageal reflux disease with esophagitis without hemorrhage    Chronic, stable Continue PPI as risks outweigh benefits       Relevant Medications   omeprazole (PRILOSEC) 40 MG capsule     Genitourinary   Stage 3a chronic kidney disease (HCC) - Primary    Chronic, variable Repeat CMP Continue to prioritize water and diet free from excess phosphorus Variable k+- pt remains on supplementation given previous hypokalemia in setting of CKD Followed by nephro      Relevant Orders   Comprehensive Metabolic Panel (CMET)     Other   Hypokalemia    Chronic, variable Repeat CMP Continue to prioritize water and diet free from excess phosphorus Variable k+- pt remains on supplementation given previous hypokalemia in setting of CKD Followed by nephro       Relevant Medications   potassium chloride (KLOR-CON M) 10 MEQ tablet     Return in about 6 months (around 08/08/2022), or if symptoms worsen or fail to improve.      Vonna Kotyk, FNP, have reviewed all documentation for this visit. The documentation on 02/07/22 for the exam, diagnosis, procedures, and orders are all accurate and complete.    Gwyneth Sprout, [place] [phone] (phone) [phone] (fax)  [place]

## 2022-02-07 ENCOUNTER — Ambulatory Visit: Payer: Medicare Other | Admitting: Family Medicine

## 2022-02-07 ENCOUNTER — Ambulatory Visit (INDEPENDENT_AMBULATORY_CARE_PROVIDER_SITE_OTHER): Payer: Medicare Other | Admitting: Family Medicine

## 2022-02-07 ENCOUNTER — Encounter: Payer: Self-pay | Admitting: Family Medicine

## 2022-02-07 VITALS — BP 128/79 | HR 62 | Resp 16 | Ht 66.0 in | Wt 167.0 lb

## 2022-02-07 DIAGNOSIS — K2100 Gastro-esophageal reflux disease with esophagitis, without bleeding: Secondary | ICD-10-CM

## 2022-02-07 DIAGNOSIS — N1831 Chronic kidney disease, stage 3a: Secondary | ICD-10-CM | POA: Diagnosis not present

## 2022-02-07 DIAGNOSIS — E876 Hypokalemia: Secondary | ICD-10-CM

## 2022-02-07 DIAGNOSIS — I10 Essential (primary) hypertension: Secondary | ICD-10-CM

## 2022-02-07 DIAGNOSIS — I6523 Occlusion and stenosis of bilateral carotid arteries: Secondary | ICD-10-CM

## 2022-02-07 MED ORDER — LISINOPRIL 20 MG PO TABS: 40.0000 mg | ORAL_TABLET | Freq: Every day | ORAL | 1 refills | Status: DC

## 2022-02-07 MED ORDER — AMLODIPINE BESYLATE 5 MG PO TABS: 5.0000 mg | ORAL_TABLET | Freq: Every day | ORAL | 1 refills | Status: DC

## 2022-02-07 MED ORDER — OMEPRAZOLE 40 MG PO CPDR: 40.0000 mg | DELAYED_RELEASE_CAPSULE | Freq: Two times a day (BID) | ORAL | 1 refills | Status: DC

## 2022-02-07 MED ORDER — ATENOLOL 50 MG PO TABS: 50.0000 mg | ORAL_TABLET | Freq: Every day | ORAL | 1 refills | Status: DC

## 2022-02-07 MED ORDER — POTASSIUM CHLORIDE CRYS ER 10 MEQ PO TBCR: 10.0000 meq | EXTENDED_RELEASE_TABLET | Freq: Every day | ORAL | 1 refills | Status: DC

## 2022-02-07 NOTE — Assessment & Plan Note (Signed)
Chronic, variable Repeat CMP Continue to prioritize water and diet free from excess phosphorus Variable k+- pt remains on supplementation given previous hypokalemia in setting of CKD Followed by nephro

## 2022-02-07 NOTE — Assessment & Plan Note (Signed)
Chronic, stable Continue PPI as risks outweigh benefits

## 2022-02-07 NOTE — Assessment & Plan Note (Signed)
Chronic, stable Continue to monitor lipids; LDL previously stable <100; however, triglycerides were elevated Currently taking lipitor 40; followed by cards

## 2022-02-07 NOTE — Assessment & Plan Note (Signed)
Chronic, variable Repeat CMP Continue to prioritize water and diet free from excess phosphorus Variable k+- pt remains on supplementation given previous hypokalemia in setting of CKD Followed by nephro 

## 2022-02-07 NOTE — Assessment & Plan Note (Signed)
Chronic, stable in office Variability in home numbers; however, reassuring that stable today in office and patient has no negative reports/side effects from medication use +1 LE edema on BLE; continue to monitor sodium intake and water intake to assist in addition to use of compression hose and elevation Continue norvasc 5 mg Atenolol 50 mg Lisinopril 20 mg

## 2022-02-08 LAB — LIPID PANEL
Chol/HDL Ratio: 2.5 ratio (ref 0.0–4.4)
Cholesterol, Total: 141 mg/dL (ref 100–199)
HDL: 57 mg/dL (ref >39)
LDL Chol Calc (NIH): 54 mg/dL (ref 0–99)
Triglycerides: 185 mg/dL — ABNORMAL HIGH (ref 0–149)
VLDL Cholesterol Cal: 30 mg/dL (ref 5–40)

## 2022-02-08 LAB — COMPREHENSIVE METABOLIC PANEL
ALT: 33 IU/L — ABNORMAL HIGH (ref 0–32)
AST: 34 IU/L (ref 0–40)
Albumin/Globulin Ratio: 1.8 (ref 1.2–2.2)
Albumin: 4.2 g/dL (ref 3.8–4.8)
Alkaline Phosphatase: 83 IU/L (ref 44–121)
BUN/Creatinine Ratio: 15 (ref 12–28)
BUN: 19 mg/dL (ref 8–27)
Bilirubin Total: 0.4 mg/dL (ref 0.0–1.2)
CO2: 23 mmol/L (ref 20–29)
Calcium: 9 mg/dL (ref 8.7–10.3)
Chloride: 103 mmol/L (ref 96–106)
Creatinine, Ser: 1.26 mg/dL — ABNORMAL HIGH (ref 0.57–1.00)
Globulin, Total: 2.3 g/dL (ref 1.5–4.5)
Glucose: 106 mg/dL — ABNORMAL HIGH (ref 70–99)
Potassium: 4.7 mmol/L (ref 3.5–5.2)
Sodium: 141 mmol/L (ref 134–144)
Total Protein: 6.5 g/dL (ref 6.0–8.5)
eGFR: 45 mL/min/{1.73_m2} — ABNORMAL LOW (ref >59)

## 2022-02-12 NOTE — Progress Notes (Signed)
Your kidneys have improved over the past 6 months; this is very reassuring given your intake of water to assist.  Cholesterol is at goal; however, elevated fats/triglycerides in blood stream remain elevated. I recommend diet low in saturated fat and regular exercise - 30 min at least 5 times per week  Gwyneth Sprout, [place] [address] [place], [place] [address] [phone] (phone) [phone] (fax) [place]

## 2022-02-15 NOTE — Progress Notes (Signed)
[facility] Summary Report / Loop Recorder 

## 2022-02-26 ENCOUNTER — Ambulatory Visit (INDEPENDENT_AMBULATORY_CARE_PROVIDER_SITE_OTHER): Payer: Medicare Other

## 2022-02-26 DIAGNOSIS — R55 Syncope and collapse: Secondary | ICD-10-CM

## 2022-03-01 DIAGNOSIS — R809 Proteinuria, unspecified: Secondary | ICD-10-CM | POA: Diagnosis not present

## 2022-03-01 DIAGNOSIS — N281 Cyst of kidney, acquired: Secondary | ICD-10-CM | POA: Diagnosis not present

## 2022-03-01 DIAGNOSIS — F258 Other schizoaffective disorders: Secondary | ICD-10-CM | POA: Diagnosis not present

## 2022-03-01 DIAGNOSIS — R8290 Unspecified abnormal findings in urine: Secondary | ICD-10-CM | POA: Diagnosis not present

## 2022-03-05 LAB — CUP PACEART REMOTE DEVICE CHECK
Date Time Interrogation Session: 20231009065612
Implantable Pulse Generator Implant Date: 20230628
Pulse Gen Serial Number: 177431

## 2022-03-08 DIAGNOSIS — I10 Essential (primary) hypertension: Secondary | ICD-10-CM | POA: Diagnosis not present

## 2022-03-08 DIAGNOSIS — K219 Gastro-esophageal reflux disease without esophagitis: Secondary | ICD-10-CM | POA: Diagnosis not present

## 2022-03-08 DIAGNOSIS — E785 Hyperlipidemia, unspecified: Secondary | ICD-10-CM | POA: Diagnosis not present

## 2022-03-08 DIAGNOSIS — R8290 Unspecified abnormal findings in urine: Secondary | ICD-10-CM | POA: Diagnosis not present

## 2022-03-09 ENCOUNTER — Ambulatory Visit: Payer: Medicare Other | Admitting: Family Medicine

## 2022-03-12 NOTE — Progress Notes (Signed)
[facility] Summary Report / Loop Recorder 

## 2022-03-29 ENCOUNTER — Ambulatory Visit (INDEPENDENT_AMBULATORY_CARE_PROVIDER_SITE_OTHER): Payer: Medicare Other

## 2022-03-29 DIAGNOSIS — R55 Syncope and collapse: Secondary | ICD-10-CM | POA: Diagnosis not present

## 2022-03-29 LAB — CUP PACEART REMOTE DEVICE CHECK
Date Time Interrogation Session: 20231102005700
Implantable Pulse Generator Implant Date: 20230628
Pulse Gen Serial Number: 177431

## 2022-04-10 NOTE — Progress Notes (Signed)
[facility] Summary Report / Loop Recorder 

## 2022-04-30 ENCOUNTER — Ambulatory Visit (INDEPENDENT_AMBULATORY_CARE_PROVIDER_SITE_OTHER): Payer: Medicare Other

## 2022-04-30 DIAGNOSIS — R55 Syncope and collapse: Secondary | ICD-10-CM | POA: Diagnosis not present

## 2022-05-01 LAB — CUP PACEART REMOTE DEVICE CHECK
Date Time Interrogation Session: 20231204010200
Implantable Pulse Generator Implant Date: 20230628
Pulse Gen Serial Number: 177431

## 2022-05-07 ENCOUNTER — Ambulatory Visit: Payer: Medicare Other | Admitting: Cardiology

## 2022-05-25 ENCOUNTER — Encounter: Payer: Self-pay | Admitting: Family Medicine

## 2022-05-25 ENCOUNTER — Ambulatory Visit (INDEPENDENT_AMBULATORY_CARE_PROVIDER_SITE_OTHER): Payer: Medicare Other | Admitting: Family Medicine

## 2022-05-25 ENCOUNTER — Ambulatory Visit: Payer: Self-pay | Admitting: *Deleted

## 2022-05-25 VITALS — BP 164/94 | HR 62 | Resp 16 | Wt 171.6 lb

## 2022-05-25 DIAGNOSIS — L03213 Periorbital cellulitis: Secondary | ICD-10-CM | POA: Diagnosis not present

## 2022-05-25 MED ORDER — CEPHALEXIN 500 MG PO CAPS: 500.0000 mg | ORAL_CAPSULE | Freq: Four times a day (QID) | ORAL | 0 refills | Status: AC

## 2022-05-25 NOTE — Assessment & Plan Note (Signed)
Exam and history consistent with acute infection  Will treat with Keflex for 7 days, 500mg  four times daily  Discussed ED precautions, see AVS  Patient to follow up PRN if symptoms do not improve

## 2022-05-25 NOTE — Patient Instructions (Signed)
Please take the prescribed Keflex four times daily (every 6 hours) for the next 7 days for the infection around your eye.   It will be important to seek care immediately if you begin to have trouble with vision.    Preseptal Cellulitis, Adult Preseptal cellulitis is an infection of the eyelid and the tissues around the eye (periorbital area). The infection causes painful swelling and redness. This condition may also be called periorbital cellulitis. In most cases, the condition can be treated with antibiotic medicine at home. It is important to treat preseptal cellulitis right away so that it does not get worse. If it gets worse, it can spread to the eye socket and eye muscles (orbital cellulitis). Orbital cellulitis is a medical emergency. What are the causes? Preseptal cellulitis is most commonly caused by bacteria. In rare cases, it can be caused by a virus or fungus. The germs that cause preseptal cellulitis may come from: A sinus infection that spreads near the eyes. An injury near the eye, such as a scratch, puncture wound, animal bite, or insect bite. A skin rash, such as eczema or poison ivy, that becomes infected. An infected pimple on the eyelid (stye). Infection after eyelid surgery or injury. What increases the risk? You are more likely to develop this condition if: You have a weakened disease-fighting system (immune system). You have a medical condition that raises your risk for sinus infections, such as nasal polyps. What are the signs or symptoms? Symptoms of this condition include: Eyelids that are red and swollen and feel unusually hot. Fever. Difficulty opening the eye. Headache. Pain in the face. Symptoms of this condition usually develop suddenly. How is this diagnosed? This condition may be diagnosed based on your symptoms, your medical history, and an eye exam. You may also have tests, such as: Blood tests. Tests (cultures) to find out which specific bacteria are  causing the infection. You may have a culture of any open wound or drainage. CT scan. MRI. This is less common. How is this treated? This condition is treated with antibiotic medicines. These may be given by mouth (orally), through an IV, or as an injection. In rare cases, you may need surgery to drain an infected area. Follow these instructions at home: Medicines Take your antibiotic medicine as told by your health care provider. Do not stop taking the antibiotic even if you start to feel better Take over-the-counter and prescription medicines only as told by your health care provider. Eye Care Do not use eye drops without first getting approval from your health care provider. Do not touch or rub your eye. If you wear contact lenses, do not wear them until your health care provider approves. Keep the eye area clean and dry. Wash the eye area with a clean washcloth, warm water, and baby shampoo or mild soap. To help relieve discomfort, place a clean washcloth that is wet with warm water over your eye. Leave the washcloth on for a few minutes, then remove it. General instructions Wash your hands with soap and water often for at least 20 seconds. If soap and water are not available, use hand sanitizer. Do not use any products that contain nicotine or tobacco, such as cigarettes, e-cigarettes, and chewing tobacco. If you need help quitting, ask your health care provider. Drink enough fluid to keep your urine pale yellow. Do not drive or operate machinery until your health care provider says that it is safe. Ask your health care provider if it is safe  for you to drive. Stay up to date on your vaccinations. Keep all follow-up visits. This includes any visits with an eye specialist (ophthalmologist) or dentist. This is important. Get help right away if: You have new symptoms. Your symptoms get worse or do not get better with treatment. You have a fever. Your vision becomes blurry or gets worse in  any way. Your eye looks like it is sticking out or bulging out (proptosis). You develop double vision. You have trouble moving your eyes or pain when moving your eyes You have a severe headache. You have neck stiffness or severe neck pain. These symptoms may represent a serious problem that is an emergency. Do not wait to see if the symptoms will go away. Get medical help right away. Call your local emergency services (911 in the [place] Do not drive yourself to the hospital. Summary Preseptal cellulitis is an infection of the eyelid and the tissues around the eye. Symptoms of preseptal cellulitis usually develop suddenly and include red and swollen eyelids, fever, difficulty opening the eye, headache, and facial pain. This condition is treated with antibiotic medicines. Do not stop taking the antibiotic even if you start to feel better. Preseptal cellulitis can develop into orbital cellulitis, which is a medical emergency. If your condition does not improve or worsens, visit your heath care provider right away. This information is not intended to replace advice given to you by your health care provider. Make sure you discuss any questions you have with your health care provider. Document Revised: 09/16/2019 Document Reviewed: 09/16/2019 Elsevier Patient Education  [place].

## 2022-05-25 NOTE — Progress Notes (Unsigned)
I,[name],acting as a scribe for [employer], MD.,have documented all relevant documentation on the behalf of Jessica Ramp, MD,as directed by  Jessica Ramp, MD while in the presence of Jessica Ramp, MD.   Established patient visit   Patient: Jessica Little   DOB: May 22, 1946   76 y.o. Female  MRN: [id number] Visit Date: 05/25/2022  Today's healthcare provider: Ronnald Ramp, MD   Chief Complaint  Patient presents with   Eye Problem   Subjective    Conjunctivitis  The current episode started 2 days ago (Feels like a stye.). The problem has been gradually worsening. Associated symptoms include eye discharge (only watery fluid). Pertinent negatives include no fever, no decreased vision, no ear discharge, no ear pain and no eye redness.    She states she has been using a warm washcloth over the eye  She also tried erythromycin drops that was prescribed for her left eye previously when she was diagnosed with tear duct  She denies recent eye trauma  Denies pain with eye movement  She denies recent debris in her eye  The swelling underneath her eye started 2 days ago and has become more swollen despite therapies  She denies visual disturbance  She has not had any fevers    Medications: Outpatient Medications Prior to Visit  Medication Sig   amLODipine (NORVASC) 5 MG tablet Take 1 tablet (5 mg total) by mouth daily.   aspirin EC 81 MG tablet Take 81 mg by mouth daily. Swallow whole.   atenolol (TENORMIN) 50 MG tablet Take 1 tablet (50 mg total) by mouth daily.   atorvastatin (LIPITOR) 40 MG tablet Take 1 tablet (40 mg total) by mouth daily.   Biotin 73419 MCG TABS Take by mouth.   Calcium-Magnesium-Zinc 500-250-12.5 MG TABS Take 1 tablet by mouth daily. Dose unknown   Cholecalciferol (VITAMIN D3 PO) Take by mouth daily.   erythromycin ophthalmic ointment Place 1 Application into the left eye at bedtime. 1 cm thin  ribbon   Flaxseed, Linseed, (FLAXSEED OIL) 1000 MG CAPS Take by mouth.   lisinopril (ZESTRIL) 20 MG tablet Take 2 tablets (40 mg total) by mouth daily.   nortriptyline (PAMELOR) 50 MG capsule Take 1 capsule (50 mg total) by mouth at bedtime.   omeprazole (PRILOSEC) 40 MG capsule Take 1 capsule (40 mg total) by mouth 2 (two) times daily.   perphenazine (TRILAFON) 4 MG tablet TAKE 1 TABLET BY MOUTH AT BEDTIME   potassium chloride (KLOR-CON M) 10 MEQ tablet Take 1 tablet (10 mEq total) by mouth daily.   vitamin E 1000 UNIT capsule Take 1,000 Units by mouth daily.   No facility-administered medications prior to visit.    Review of Systems  Constitutional:  Negative for fever.  HENT:  Negative for ear discharge and ear pain.   Eyes:  Positive for discharge (only watery fluid). Negative for redness.    {Labs  Heme  Chem  Endocrine  Serology  Results Review (optional):23779}   Objective    BP (!) 175/98 (BP Location: Left Arm, Patient Position: Sitting, Cuff Size: Large)   Pulse 68   Resp 16   Wt 171 lb 9.6 oz (77.8 kg)   BMI 27.70 kg/m  {Show previous vital signs (optional):23777}  Physical Exam Eyes:     General: Vision grossly intact. No visual field deficit.       Right eye: Foreign body and discharge present.     Extraocular Movements: Extraocular movements intact.  Conjunctiva/sclera:     Right eye: Right conjunctiva is injected.     Left eye: Left conjunctiva is not injected.     Comments: Erythematous conjunctiva of right eye  Discharge present  Edema of lower eye lid on right   No swelling, no erythema of left eye    Pulmonary:     Effort: Pulmonary effort is normal. No respiratory distress.      No results found for any visits on 05/25/22.  Assessment & Plan     Problem List Items Addressed This Visit       Other   Preseptal cellulitis of right lower eyelid - Primary    Exam and history consistent with acute infection  Will treat with Keflex for 7  days, 500mg  four times daily  Discussed ED precautions, see AVS  Patient to follow up PRN if symptoms do not improve         Return in about 10 days (around 06/04/2022), or if symptoms worsen or fail to improve, for HTN .     I, 08/03/2022, MD, have reviewed all documentation for this visit.  Portions of this information were initially documented by the CMA and reviewed by me for thoroughness and accuracy.      Jessica Ramp, MD  [facility] [phone] (phone) [phone] (fax)  [facility]

## 2022-05-25 NOTE — Telephone Encounter (Signed)
Message from [employer] sent at 05/25/2022  1:05 PM EST  Summary: right eye discomforrt   The patient has experienced discomfort in the bottom of their right eye  The patient shares that their under eye area has been irritated for roughly two days  The patient has experienced eyelid discomfort previously in July  Please contact the patient further when possible          Call History   Type Contact Phone/Fax User  05/25/2022 01:04 PM EST Phone (Incoming) Little, Jessica Keagy (Self) [phone] (H) Coley, Everette A

## 2022-05-25 NOTE — Telephone Encounter (Signed)
Reason for Disposition  Eye pain present > 24 hours  Answer Assessment - Initial Assessment Questions 1. ONSET: "When did the pain start?" (e.g., minutes, hours, days)     I'm having soreness in the bottom of my right eye   There is a white place inside my lower lower eyelid.    It's tender.   It's swollen and tender.     2. TIMING: "Does the pain come and go, or has it been constant since it started?" (e.g., constant, intermittent, fleeting)     There a day or two     3. SEVERITY: "How bad is the pain?"   (Scale 1-10; mild, moderate or severe)   - MILD (1-3): doesn't interfere with normal activities    - MODERATE (4-7): interferes with normal activities or awakens from sleep    - SEVERE (8-10): excruciating pain and patient unable to do normal activities     It's more tender today especially when I touch it.When 4. LOCATION: "Where does it hurt?"  (e.g., eyelid, eye, cheekbone)     My right lower eyelid.   It's swollen like fluid under my eye.   No red. 5. CAUSE: "What do you think is causing the pain?"     I'm not sure. 6. VISION: "Do you have blurred vision or changes in your vision?"      No  7. EYE DISCHARGE: "Is there any discharge (pus) from the eye(s)?"  If Yes, ask: "What color is it?"      It's very watery 8. FEVER: "Do you have a fever?" If Yes, ask: "What is it, how was it measured, and when did it start?"      Not asked 9. OTHER SYMPTOMS: "Do you have any other symptoms?" (e.g., headache, nasal discharge, facial rash)     No 10. PREGNANCY: "Is there any chance you are pregnant?" "When was your last menstrual period?"       N/A due to age  Protocols used: Eye Pain and Other Symptoms-A-AH  Chief Complaint: Sore white area inside my lower right eyelid that is swollen and tender to the touch Symptoms: Eye is very watery Frequency: Started 2 days ago. Pertinent Negatives: Patient denies pain or burning in her eye. Disposition: [] ED /[] Urgent Care (no appt availability in  office) / [x] Appointment(In office/virtual)/ []  [facility]/ [] Home Care/ [] Refused Recommended Disposition /[] [facility]/ []  Follow-up with PCP Additional Notes: Appt made with Dr. for today at 3:00.

## 2022-05-31 ENCOUNTER — Ambulatory Visit (INDEPENDENT_AMBULATORY_CARE_PROVIDER_SITE_OTHER): Payer: Medicare Other

## 2022-05-31 DIAGNOSIS — R55 Syncope and collapse: Secondary | ICD-10-CM | POA: Diagnosis not present

## 2022-06-04 LAB — CUP PACEART REMOTE DEVICE CHECK
Date Time Interrogation Session: 20240108010600
Implantable Pulse Generator Implant Date: 20230628
Pulse Gen Serial Number: 177431

## 2022-06-08 NOTE — Progress Notes (Signed)
[place] [facility]  

## 2022-06-11 ENCOUNTER — Encounter: Payer: Self-pay | Admitting: Cardiology

## 2022-06-11 ENCOUNTER — Ambulatory Visit: Payer: Medicare Other | Attending: Cardiology | Admitting: Cardiology

## 2022-06-11 VITALS — BP 120/80 | HR 62 | Ht 66.0 in | Wt 168.4 lb

## 2022-06-11 DIAGNOSIS — I4729 Other ventricular tachycardia: Secondary | ICD-10-CM

## 2022-06-11 DIAGNOSIS — I10 Essential (primary) hypertension: Secondary | ICD-10-CM | POA: Diagnosis not present

## 2022-06-11 MED ORDER — ATORVASTATIN CALCIUM 40 MG PO TABS: 40.0000 mg | ORAL_TABLET | Freq: Every day | ORAL | 3 refills | Status: DC

## 2022-06-11 NOTE — Patient Instructions (Signed)
Medication Instructions:   Your physician recommends that you continue on your current medications as directed. Please refer to the Current Medication list given to you today.  *If you need a refill on your cardiac medications before your next appointment, please call your pharmacy*   Lab Work:  None Ordered  If you have labs (blood work) drawn today and your tests are completely normal, you will receive your results only by: [place] (if you have MyChart) OR A paper copy in the mail If you have any lab test that is abnormal or we need to change your treatment, we will call you to review the results.   Testing/Procedures:  None Ordered   Follow-Up: At [facility], you and your health needs are our priority.  As part of our continuing mission to provide you with exceptional heart care, we have created designated Provider Care Teams.  These Care Teams include your primary Cardiologist (physician) and Advanced Practice Providers (APPs -  Physician Assistants and Nurse Practitioners) who all work together to provide you with the care you need, when you need it.  We recommend signing up for the patient portal called "MyChart".  Sign up information is provided on this After Visit Summary.  MyChart is used to connect with patients for Virtual Visits (Telemedicine).  Patients are able to view lab/test results, encounter notes, upcoming appointments, etc.  Non-urgent messages can be sent to your provider as well.   To learn more about what you can do with MyChart, go to [email or web address].    Your next appointment:    12 month(s) - [place]

## 2022-06-11 NOTE — Progress Notes (Signed)
Cardiology Office Note:    Date:  06/11/2022   ID:  Jessica Little, DOB [birth year], MRN [id number]  PCP:  Gwyneth Sprout, FNP   [facility] Providers Cardiologist:  Kate Sable, MD Electrophysiologist:  Vickie Epley, MD     Referring MD: Gwyneth Sprout, FNP   Chief Complaint  Patient presents with   Other    6 month f/u no complaints today. Meds reviewed verbally with pt.    History of Present Illness:    Jessica Little is a 77 y.o. female with a hx of hypertension, hyperlipidemia, paroxysmal SVT, CKD who presents for follow-up.    Being seen for hypertension, paroxysmal SVT.  Denies palpitations, BP adequately controlled on current medications.  History of syncope, ILR placed, no significant arrhythmias noted so far.  Patient is curious as to when she will obtain ILR.  Feels well, no new cardiac concerns.  Prior notes Echo 04/2019 20 EF 60 to 65% Cardiac monitor 03/2021, paroxysmal SVT, nonsustained VT lasting up to 14 beats.  Past Medical History:  Diagnosis Date   Anxiety    GERD (gastroesophageal reflux disease)    Hyperlipidemia    Hypertension     Past Surgical History:  Procedure Laterality Date   CATARACT EXTRACTION W/PHACO Left 01/18/2021   Procedure: CATARACT EXTRACTION PHACO AND INTRAOCULAR LENS PLACEMENT ([facility]) LEFT 7.40 01:11.7;  Surgeon: Leandrew Koyanagi, MD;  Location: [place];  Service: Ophthalmology;  Laterality: Left;   CATARACT EXTRACTION W/PHACO Right 02/01/2021   Procedure: CATARACT EXTRACTION PHACO AND INTRAOCULAR LENS PLACEMENT ([place]) RIGHT EYHANCE TORIC 8.60 01:09.9 ;  Surgeon: Leandrew Koyanagi, MD;  Location: [place];  Service: Ophthalmology;  Laterality: Right;   CHOLECYSTECTOMY     COLONOSCOPY  01/01/2006   COLONOSCOPY WITH PROPOFOL N/A 08/23/2015   Procedure: COLONOSCOPY WITH PROPOFOL;  Surgeon: Robert Bellow, MD;  Location[facility] ENDOSCOPY;  Service: Endoscopy;  Laterality: N/A;   heart  monitor Right 10/22/2021   HIATAL HERNIA REPAIR  2019    Current Medications: Current Meds  Medication Sig   amLODipine (NORVASC) 5 MG tablet Take 1 tablet (5 mg total) by mouth daily.   aspirin EC 81 MG tablet Take 81 mg by mouth daily. Swallow whole.   atenolol (TENORMIN) 50 MG tablet Take 1 tablet (50 mg total) by mouth daily.   Biotin 10000 MCG TABS Take by mouth.   Calcium-Magnesium-Zinc 500-250-12.5 MG TABS Take 1 tablet by mouth daily. Dose unknown   Cholecalciferol (VITAMIN D3 PO) Take by mouth daily.   erythromycin ophthalmic ointment Place 1 Application into the left eye at bedtime. 1 cm thin ribbon   Flaxseed, Linseed, (FLAXSEED OIL) 1000 MG CAPS Take by mouth.   lisinopril (ZESTRIL) 20 MG tablet Take 2 tablets (40 mg total) by mouth daily.   nortriptyline (PAMELOR) 50 MG capsule Take 1 capsule (50 mg total) by mouth at bedtime.   omeprazole (PRILOSEC) 40 MG capsule Take 1 capsule (40 mg total) by mouth 2 (two) times daily.   perphenazine (TRILAFON) 4 MG tablet TAKE 1 TABLET BY MOUTH AT BEDTIME   potassium chloride (KLOR-CON M) 10 MEQ tablet Take 1 tablet (10 mEq total) by mouth daily.   vitamin E 1000 UNIT capsule Take 1,000 Units by mouth daily.   [DISCONTINUED] atorvastatin (LIPITOR) 40 MG tablet Take 1 tablet (40 mg total) by mouth daily.     Allergies:   Patient has no known allergies.   Social History   Socioeconomic History  Marital status: Widowed    Spouse name: Not on file   Number of children: 0   Years of education: College   Highest education level: Bachelor's degree (e.g., BA, AB, BS)  Occupational History   Occupation: Retired  Tobacco Use   Smoking status: Never   Smokeless tobacco: Never  Vaping Use   Vaping Use: Never used  Substance and Sexual Activity   Alcohol use: No   Drug use: No   Sexual activity: Not on file  Other Topics Concern   Not on file  Social History Narrative   Right handed   No caffeine   One story home   Social  Determinants of Health   Financial Resource Strain: Low Risk  (08/07/2021)   Overall Financial Resource Strain (CARDIA)    Difficulty of Paying Living Expenses: Not hard at all  Food Insecurity: No Food Insecurity (08/07/2021)   Hunger Vital Sign    Worried About Running Out of Food in the Last Year: Never true    Ran Out of Food in the Last Year: Never true  Transportation Needs: No Transportation Needs (08/07/2021)   PRAPARE - Hydrologist (Medical): No    Lack of Transportation (Non-Medical): No  Physical Activity: Insufficiently Active (08/07/2021)   Exercise Vital Sign    Days of Exercise per Week: 2 days    Minutes of Exercise per Session: 40 min  Stress: No Stress Concern Present (08/07/2021)   [place]    Feeling of Stress : Not at all  Social Connections: Moderately Isolated (08/07/2021)   Social Connection and Isolation Panel [NHANES]    Frequency of Communication with Friends and Family: Twice a week    Frequency of Social Gatherings with Friends and Family: Three times a week    Attends Religious Services: More than 4 times per year    Active Member of Clubs or Organizations: No    Attends Archivist Meetings: Never    Marital Status: Widowed     Family History: The patient's family history includes Arrhythmia in her mother; Depression in her sister; Healthy in her brother; Heart attack in her father; Hypertension in her sister; Stroke in her father. There is no history of Breast cancer.  ROS:   Please see the history of present illness.     All other systems reviewed and are negative.  EKGs/Labs/Other Studies Reviewed:    The following studies were reviewed today:   EKG:  EKG is  ordered today.  The ekg ordered 03/19/2021 demonstrates normal sinus rhythm.  Recent Labs: 09/22/2021: TSH 1.126 09/24/2021: Magnesium 2.3 10/09/2021: Hemoglobin 12.2; Platelets  277 02/07/2022: ALT 33; BUN 19; Creatinine, Ser 1.26; Potassium 4.7; Sodium 141  Recent Lipid Panel    Component Value Date/Time   CHOL 141 02/07/2022 1105   TRIG 185 (H) 02/07/2022 1105   HDL 57 02/07/2022 1105   CHOLHDL 2.5 02/07/2022 1105   LDLCALC 54 02/07/2022 1105     Risk Assessment/Calculations:          Physical Exam:    VS:  BP 120/80 (BP Location: Left Arm, Patient Position: Sitting, Cuff Size: Normal)   Pulse 62   Ht 5\' 6"  (1.676 m)   Wt 168 lb 6 oz (76.4 kg)   SpO2 98%   BMI 27.18 kg/m     Wt Readings from Last 3 Encounters:  06/11/22 168 lb 6 oz (76.4 kg)  05/25/22 171  lb 9.6 oz (77.8 kg)  02/07/22 167 lb (75.8 kg)     GEN:  Well nourished, well developed in no acute distress HEENT: Normal NECK: No JVD; No carotid bruits CARDIAC: RRR, no murmurs, rubs, gallops RESPIRATORY:  Clear to auscultation without rales, wheezing or rhonchi  ABDOMEN: Soft, non-tender, non-distended MUSCULOSKELETAL:  No edema; No deformity  SKIN: Warm and dry NEUROLOGIC:  Alert and oriented x 3 PSYCHIATRIC:  Normal affect   ASSESSMENT:    1. Primary hypertension   2. NSVT (nonsustained ventricular tachycardia) (HCC)    PLAN:    In order of problems listed above:  Hypertension, BP controlled.  Continue lisinopril 40 mg daily, Norvasc 5 mg daily. NSVT, paroxysmal SVT.  Continue atenolol.  ILR in place due to history of syncope, no evidence for arrhythmias.  Follow-up with EP regarding duration of ILR monitoring.  Follow-up in 12 months    Medication Adjustments/Labs and Tests Ordered: Current medicines are reviewed at length with the patient today.  Concerns regarding medicines are outlined above.  Orders Placed This Encounter  Procedures   EKG 12-Lead    Meds ordered this encounter  Medications   atorvastatin (LIPITOR) 40 MG tablet    Sig: Take 1 tablet (40 mg total) by mouth daily.    Dispense:  90 tablet    Refill:  3     Patient Instructions  Medication  Instructions:   Your physician recommends that you continue on your current medications as directed. Please refer to the Current Medication list given to you today.  *If you need a refill on your cardiac medications before your next appointment, please call your pharmacy*   Lab Work:  None Ordered  If you have labs (blood work) drawn today and your tests are completely normal, you will receive your results only by: MyChart Message (if you have MyChart) OR A paper copy in the mail If you have any lab test that is abnormal or we need to change your treatment, we will call you to review the results.   Testing/Procedures:  None Ordered   Follow-Up: At [facility], you and your health needs are our priority.  As part of our continuing mission to provide you with exceptional heart care, we have created designated Provider Care Teams.  These Care Teams include your primary Cardiologist (physician) and Advanced Practice Providers (APPs -  Physician Assistants and Nurse Practitioners) who all work together to provide you with the care you need, when you need it.  We recommend signing up for the patient portal called "MyChart".  Sign up information is provided on this After Visit Summary.  MyChart is used to connect with patients for Virtual Visits (Telemedicine).  Patients are able to view lab/test results, encounter notes, upcoming appointments, etc.  Non-urgent messages can be sent to your provider as well.   To learn more about what you can do with MyChart, go to [email or web address].    Your next appointment:    12 month(s) - [place]   First Available - [place]     Signed, Debbe Odea, MD  06/11/2022 3:09 PM    [facility]

## 2022-06-21 NOTE — Progress Notes (Signed)
Carelink Summary Report / Loop Recorder

## 2022-06-26 NOTE — Progress Notes (Unsigned)
Electrophysiology Office Follow up Visit Note:    Date:  06/27/2022   ID:  Jessica Little, DOB 08/11/1945, MRN [id number]  PCP:  Gwyneth Sprout, FNP  [facility] Cardiologist:  Kate Sable, MD  [facility] Electrophysiologist:  Vickie Epley, MD    Interval History:    Jessica Little is a 77 y.o. female who presents for a follow up visit. I last saw the patient 11/22/2021 for syncope. A ILR was implanted. Thus far, ILR has not shown any arrhythmias.  She presents today for follow up.  Today she tells me she has not had a recurrent syncopal episode.  She feels overall well.      Past Medical History:  Diagnosis Date   Anxiety    GERD (gastroesophageal reflux disease)    Hyperlipidemia    Hypertension     Past Surgical History:  Procedure Laterality Date   CATARACT EXTRACTION W/PHACO Left 01/18/2021   Procedure: CATARACT EXTRACTION PHACO AND INTRAOCULAR LENS PLACEMENT (IOC) LEFT 7.40 01:11.7;  Surgeon: Leandrew Koyanagi, MD;  Location: [place];  Service: Ophthalmology;  Laterality: Left;   CATARACT EXTRACTION W/PHACO Right 02/01/2021   Procedure: CATARACT EXTRACTION PHACO AND INTRAOCULAR LENS PLACEMENT ([place]) RIGHT EYHANCE TORIC 8.60 01:09.9 ;  Surgeon: Leandrew Koyanagi, MD;  Location: [place];  Service: Ophthalmology;  Laterality: Right;   CHOLECYSTECTOMY     COLONOSCOPY  01/01/2006   COLONOSCOPY WITH PROPOFOL N/A 08/23/2015   Procedure: COLONOSCOPY WITH PROPOFOL;  Surgeon: Robert Bellow, MD;  Location: [facility] ENDOSCOPY;  Service: Endoscopy;  Laterality: N/A;   heart monitor Right 10/22/2021   HIATAL HERNIA REPAIR  2019    Current Medications: Current Meds  Medication Sig   amLODipine (NORVASC) 5 MG tablet Take 1 tablet (5 mg total) by mouth daily.   aspirin EC 81 MG tablet Take 81 mg by mouth daily. Swallow whole.   atenolol (TENORMIN) 50 MG tablet Take 1 tablet (50 mg total) by mouth daily.   atorvastatin (LIPITOR) 40  MG tablet Take 1 tablet (40 mg total) by mouth daily.   Biotin 10000 MCG TABS Take by mouth.   Calcium-Magnesium-Zinc 500-250-12.5 MG TABS Take 1 tablet by mouth daily. Dose unknown   Cholecalciferol (VITAMIN D3 PO) Take by mouth daily.   erythromycin ophthalmic ointment Place 1 Application into the left eye at bedtime. 1 cm thin ribbon   Flaxseed, Linseed, (FLAXSEED OIL) 1000 MG CAPS Take by mouth.   lisinopril (ZESTRIL) 20 MG tablet Take 2 tablets (40 mg total) by mouth daily.   nortriptyline (PAMELOR) 50 MG capsule Take 1 capsule (50 mg total) by mouth at bedtime.   omeprazole (PRILOSEC) 40 MG capsule Take 1 capsule (40 mg total) by mouth 2 (two) times daily.   perphenazine (TRILAFON) 4 MG tablet TAKE 1 TABLET BY MOUTH AT BEDTIME   potassium chloride (KLOR-CON M) 10 MEQ tablet Take 1 tablet (10 mEq total) by mouth daily.   vitamin E 1000 UNIT capsule Take 1,000 Units by mouth daily.     Allergies:   Patient has no known allergies.   Social History   Socioeconomic History   Marital status: Widowed    Spouse name: Not on file   Number of children: 0   Years of education: College   Highest education level: Bachelor's degree (e.g., BA, AB, BS)  Occupational History   Occupation: Retired  Tobacco Use   Smoking status: Never   Smokeless tobacco: Never  Vaping Use   Vaping  Use: Never used  Substance and Sexual Activity   Alcohol use: No   Drug use: No   Sexual activity: Not on file  Other Topics Concern   Not on file  Social History Narrative   Right handed   No caffeine   One story home   Social Determinants of Health   Financial Resource Strain: Low Risk  (08/07/2021)   Overall Financial Resource Strain (CARDIA)    Difficulty of Paying Living Expenses: Not hard at all  Food Insecurity: No Food Insecurity (08/07/2021)   Hunger Vital Sign    Worried About Running Out of Food in the Last Year: Never true    Ran Out of Food in the Last Year: Never true  Transportation  Needs: No Transportation Needs (08/07/2021)   PRAPARE - Hydrologist (Medical): No    Lack of Transportation (Non-Medical): No  Physical Activity: Insufficiently Active (08/07/2021)   Exercise Vital Sign    Days of Exercise per Week: 2 days    Minutes of Exercise per Session: 40 min  Stress: No Stress Concern Present (08/07/2021)   [place]    Feeling of Stress : Not at all  Social Connections: Moderately Isolated (08/07/2021)   Social Connection and Isolation Panel [NHANES]    Frequency of Communication with Friends and Family: Twice a week    Frequency of Social Gatherings with Friends and Family: Three times a week    Attends Religious Services: More than 4 times per year    Active Member of Clubs or Organizations: No    Attends Archivist Meetings: Never    Marital Status: Widowed     Family History: The patient's family history includes Arrhythmia in her mother; Depression in her sister; Healthy in her brother; Heart attack in her father; Hypertension in her sister; Stroke in her father. There is no history of Breast cancer.  ROS:   Please see the history of present illness.    All other systems reviewed and are negative.  EKGs/Labs/Other Studies Reviewed:    The following studies were reviewed today:  Recent device interrogation reviewed and shows no significant arrhythmias   Recent Labs: 09/22/2021: TSH 1.126 09/24/2021: Magnesium 2.3 10/09/2021: Hemoglobin 12.2; Platelets 277 02/07/2022: ALT 33; BUN 19; Creatinine, Ser 1.26; Potassium 4.7; Sodium 141  Recent Lipid Panel    Component Value Date/Time   CHOL 141 02/07/2022 1105   TRIG 185 (H) 02/07/2022 1105   HDL 57 02/07/2022 1105   CHOLHDL 2.5 02/07/2022 1105   LDLCALC 54 02/07/2022 1105    Physical Exam:    VS:  BP 132/86   Pulse 65   Ht 5\' 6"  (1.676 m)   Wt 168 lb (76.2 kg)   SpO2 98%   BMI 27.12 kg/m      Wt Readings from Last 3 Encounters:  06/27/22 168 lb (76.2 kg)  06/11/22 168 lb 6 oz (76.4 kg)  05/25/22 171 lb 9.6 oz (77.8 kg)     GEN:  Well nourished, well developed in no acute distress.  Elderly CARDIAC: RRR, no murmurs, rubs, gallops RESPIRATORY:  Clear to auscultation without rales, wheezing or rhonchi        ASSESSMENT:    1. Syncope and collapse    PLAN:    In order of problems listed above:  #Syncope Unclear cause. No arrhythmias identified thus far on ILR. She would like to stop remotely monitoring given the costs  associated with the monthly checks.  We will stop actively monitoring her device but leave the device in place in case she were to have another episode.  She understands that if she were to have a near syncope or syncope episode she should reach out to the office for [place] to manually interrogate the device.  Follow-up as needed with EP.      Medication Adjustments/Labs and Tests Ordered: Current medicines are reviewed at length with the patient today.  Concerns regarding medicines are outlined above.  No orders of the defined types were placed in this encounter.  No orders of the defined types were placed in this encounter.    Signed, Lars Mage, MD, [facility], [facility] 06/27/2022 1:47 PM    Electrophysiology [facility]

## 2022-06-27 ENCOUNTER — Telehealth: Payer: Self-pay

## 2022-06-27 ENCOUNTER — Encounter: Payer: Self-pay | Admitting: Cardiology

## 2022-06-27 ENCOUNTER — Ambulatory Visit: Payer: Medicare Other | Attending: Cardiology | Admitting: Cardiology

## 2022-06-27 VITALS — BP 132/86 | HR 65 | Ht 66.0 in | Wt 168.0 lb

## 2022-06-27 DIAGNOSIS — R55 Syncope and collapse: Secondary | ICD-10-CM

## 2022-06-27 NOTE — Telephone Encounter (Signed)
I canceled all upcoming remotes and put a note in P[facility] that the pt no longer wanted to be monitor remotely.

## 2022-06-27 NOTE — Telephone Encounter (Signed)
-----  Message from [name], RN sent at 06/27/2022  1:53 PM EST ----- Patient saw Dr. Quentin Ore today. She has a ILR with no arhythmia identified so far. She would like to stop her monthly monitoring due to cost. Can you stop her checks? Thanks! Jessica Little

## 2022-06-27 NOTE — Patient Instructions (Signed)
Medication Instructions:  Your physician recommends that you continue on your current medications as directed. Please refer to the Current Medication list given to you today.  *If you need a refill on your cardiac medications before your next appointment, please call your pharmacy*  Follow-Up: At [facility], you and your health needs are our priority.  As part of our continuing mission to provide you with exceptional heart care, we have created designated Provider Care Teams.  These Care Teams include your primary Cardiologist (physician) and Advanced Practice Providers (APPs -  Physician Assistants and Nurse Practitioners) who all work together to provide you with the care you need, when you need it.  Your next appointment:   As needed with Dr. Quentin Ore

## 2022-07-30 ENCOUNTER — Ambulatory Visit (INDEPENDENT_AMBULATORY_CARE_PROVIDER_SITE_OTHER): Payer: Medicare Other | Admitting: Family Medicine

## 2022-07-30 ENCOUNTER — Encounter: Payer: Self-pay | Admitting: Family Medicine

## 2022-07-30 VITALS — BP 114/78 | HR 75 | Ht 66.0 in | Wt 168.0 lb

## 2022-07-30 DIAGNOSIS — E7800 Pure hypercholesterolemia, unspecified: Secondary | ICD-10-CM

## 2022-07-30 DIAGNOSIS — K2100 Gastro-esophageal reflux disease with esophagitis, without bleeding: Secondary | ICD-10-CM | POA: Diagnosis not present

## 2022-07-30 DIAGNOSIS — I10 Essential (primary) hypertension: Secondary | ICD-10-CM

## 2022-07-30 DIAGNOSIS — R7303 Prediabetes: Secondary | ICD-10-CM

## 2022-07-30 DIAGNOSIS — N1831 Chronic kidney disease, stage 3a: Secondary | ICD-10-CM

## 2022-07-30 DIAGNOSIS — F251 Schizoaffective disorder, depressive type: Secondary | ICD-10-CM

## 2022-07-30 MED ORDER — ATENOLOL 50 MG PO TABS: 50.0000 mg | ORAL_TABLET | Freq: Every day | ORAL | 1 refills | Status: DC

## 2022-07-30 MED ORDER — OMEPRAZOLE 40 MG PO CPDR: 40.0000 mg | DELAYED_RELEASE_CAPSULE | Freq: Two times a day (BID) | ORAL | 1 refills | Status: DC

## 2022-07-30 MED ORDER — AMLODIPINE BESYLATE 5 MG PO TABS: 5.0000 mg | ORAL_TABLET | Freq: Every day | ORAL | 1 refills | Status: DC

## 2022-07-30 NOTE — Assessment & Plan Note (Signed)
Chronic, previously stable Repeat LP Remains on lipitor 40 mg

## 2022-07-30 NOTE — Assessment & Plan Note (Signed)
Chronic, stable Continue pamelor 50 mg nightly

## 2022-07-30 NOTE — Progress Notes (Signed)
Established patient visit  Patient: Jessica Little   DOB: 11-10-1945   76 y.o. Female  MRN: [id number] Visit Date: 07/30/2022  Today's healthcare provider: Gwyneth Sprout, FNP  Re Introduced to nurse practitioner role and practice setting.  All questions answered.  Discussed provider/patient relationship and expectations.  Subjective    HPI HPI   6 months follow-up. Last edited by Elta Guadeloupe, CMA on 07/30/2022  2:26 PM.      Hypertension: Patient here for follow-up of elevated blood pressure. She is not exercising and is not adherent to low salt diet.  Blood pressure is not well controlled at home. Cardiac symptoms none. Patient denies none.  Cardiovascular risk factors: advanced age (older than 48 for men, 2 for women), hypertension, and sedentary lifestyle. Use of agents associated with hypertension: none. History of target organ damage: chronic kidney disease.  Hyperlipidemia: Patient presents with hyperlipidemia.  She was tested because 02/07/2022.  Her last labs showed Total cholesterol of 141, HDL 57, LDL 54,  Triglycerides 185. negative.   Follow up for CKD  The patient was last seen for this 6 months ago. Changes made at last visit include increasing fluids; avoidance of nephrotoxic agents.  She feels that condition is Unchanged. She is not having side effects.  -----------------------------------------------------------------------------------------  Medications: Outpatient Medications Prior to Visit  Medication Sig   aspirin EC 81 MG tablet Take 81 mg by mouth daily. Swallow whole.   atorvastatin (LIPITOR) 40 MG tablet Take 1 tablet (40 mg total) by mouth daily.   Biotin 10000 MCG TABS Take by mouth.   Calcium-Magnesium-Zinc 500-250-12.5 MG TABS Take 1 tablet by mouth daily. Dose unknown   Cholecalciferol (VITAMIN D3 PO) Take by mouth daily.   erythromycin ophthalmic ointment Place 1 Application into the left eye at bedtime. 1 cm thin ribbon   Flaxseed, Linseed,  (FLAXSEED OIL) 1000 MG CAPS Take by mouth.   lisinopril (ZESTRIL) 20 MG tablet Take 2 tablets (40 mg total) by mouth daily.   nortriptyline (PAMELOR) 50 MG capsule Take 1 capsule (50 mg total) by mouth at bedtime.   perphenazine (TRILAFON) 4 MG tablet TAKE 1 TABLET BY MOUTH AT BEDTIME   potassium chloride (KLOR-CON M) 10 MEQ tablet Take 1 tablet (10 mEq total) by mouth daily.   vitamin E 1000 UNIT capsule Take 1,000 Units by mouth daily.   [DISCONTINUED] amLODipine (NORVASC) 5 MG tablet Take 1 tablet (5 mg total) by mouth daily.   [DISCONTINUED] atenolol (TENORMIN) 50 MG tablet Take 1 tablet (50 mg total) by mouth daily.   [DISCONTINUED] omeprazole (PRILOSEC) 40 MG capsule Take 1 capsule (40 mg total) by mouth 2 (two) times daily.   No facility-administered medications prior to visit.    Review of Systems    Objective    BP 114/78 (BP Location: Right Arm, Patient Position: Sitting, Cuff Size: Normal)   Pulse 75   Ht '5\' 6"'$  (1.676 m)   Wt 168 lb (76.2 kg)   SpO2 98%   BMI 27.12 kg/m   Physical Exam Vitals and nursing note reviewed.  Constitutional:      General: She is not in acute distress.    Appearance: Normal appearance. She is overweight. She is not ill-appearing, toxic-appearing or diaphoretic.  HENT:     Head: Normocephalic and atraumatic.  Cardiovascular:     Rate and Rhythm: Normal rate and regular rhythm.     Pulses: Normal pulses.     Heart sounds: Normal heart sounds.  No murmur heard.    No friction rub. No gallop.  Pulmonary:     Effort: Pulmonary effort is normal. No respiratory distress.     Breath sounds: Normal breath sounds. No stridor. No wheezing, rhonchi or rales.  Chest:     Chest wall: No tenderness.  Musculoskeletal:        General: No swelling, tenderness, deformity or signs of injury. Normal range of motion.     Right lower leg: No edema.     Left lower leg: No edema.  Skin:    General: Skin is warm and dry.     Capillary Refill: Capillary  refill takes less than 2 seconds.     Coloration: Skin is not jaundiced or pale.     Findings: No bruising, erythema, lesion or rash.  Neurological:     General: No focal deficit present.     Mental Status: She is alert and oriented to person, place, and time. Mental status is at baseline.     Cranial Nerves: No cranial nerve deficit.     Sensory: No sensory deficit.     Motor: No weakness.     Coordination: Coordination normal.  Psychiatric:        Mood and Affect: Mood normal.        Behavior: Behavior normal.        Thought Content: Thought content normal.        Judgment: Judgment normal.      No results found for any visits on 07/30/22.  Assessment & Plan     Problem List Items Addressed This Visit       Cardiovascular and Mediastinum   Primary hypertension - Primary    Chronic, stable in office Continue to be elevated at home; however, pt takes her BP prior to medications Continue current treatment  Norvasc 5 mg, atenolol 50, lisinopril 40      Relevant Medications   amLODipine (NORVASC) 5 MG tablet   atenolol (TENORMIN) 50 MG tablet   Other Relevant Orders   CBC with Differential/Platelet   Comprehensive Metabolic Panel (CMET)     Digestive   Gastroesophageal reflux disease with esophagitis without hemorrhage    Chronic, stable Continue prilosec 40 mg BID      Relevant Medications   omeprazole (PRILOSEC) 40 MG capsule   Other Relevant Orders   CBC with Differential/Platelet     Genitourinary   Stage 3a chronic kidney disease (HCC)    Chronic, stable Remains on AceI lisinopril 40 mg      Relevant Orders   Comprehensive Metabolic Panel (CMET)     Other   HLD (hyperlipidemia)    Chronic, previously stable Repeat LP Remains on lipitor 40 mg       Relevant Medications   amLODipine (NORVASC) 5 MG tablet   atenolol (TENORMIN) 50 MG tablet   Other Relevant Orders   Lipid panel   Prediabetes    Chronic, previously stable Repeat A1c Continue to  recommend balanced, lower carb meals. Smaller meal size, adding snacks. Choosing water as drink of choice and increasing purposeful exercise.       Relevant Orders   Hemoglobin A1c   Schizoaffective disorder, depressive type (HCC)    Chronic, stable Continue pamelor 50 mg nightly       Return in about 6 months (around 01/30/2023) for annual examination.     Vonna Kotyk, FNP, have reviewed all documentation for this visit. The documentation on 07/30/22 for the exam, diagnosis,  procedures, and orders are all accurate and complete.  Gwyneth Sprout, [place] [phone] (phone) [phone] (fax)  [place]

## 2022-07-30 NOTE — Assessment & Plan Note (Signed)
Chronic, stable in office Continue to be elevated at home; however, pt takes her BP prior to medications Continue current treatment  Norvasc 5 mg, atenolol 50, lisinopril 40

## 2022-07-30 NOTE — Assessment & Plan Note (Signed)
Chronic, stable Continue prilosec 40 mg BID

## 2022-07-30 NOTE — Assessment & Plan Note (Signed)
Chronic, previously stable Repeat A1c Continue to recommend balanced, lower carb meals. Smaller meal size, adding snacks. Choosing water as drink of choice and increasing purposeful exercise.

## 2022-07-30 NOTE — Assessment & Plan Note (Signed)
Chronic, stable Remains on AceI lisinopril 40 mg

## 2022-07-31 LAB — COMPREHENSIVE METABOLIC PANEL
ALT: 25 IU/L (ref 0–32)
AST: 26 IU/L (ref 0–40)
Albumin/Globulin Ratio: 2 (ref 1.2–2.2)
Albumin: 4.3 g/dL (ref 3.8–4.8)
Alkaline Phosphatase: 88 IU/L (ref 44–121)
BUN/Creatinine Ratio: 18 (ref 12–28)
BUN: 21 mg/dL (ref 8–27)
Bilirubin Total: 0.4 mg/dL (ref 0.0–1.2)
CO2: 25 mmol/L (ref 20–29)
Calcium: 9.2 mg/dL (ref 8.7–10.3)
Chloride: 103 mmol/L (ref 96–106)
Creatinine, Ser: 1.17 mg/dL — ABNORMAL HIGH (ref 0.57–1.00)
Globulin, Total: 2.2 g/dL (ref 1.5–4.5)
Glucose: 103 mg/dL — ABNORMAL HIGH (ref 70–99)
Potassium: 3.7 mmol/L (ref 3.5–5.2)
Sodium: 142 mmol/L (ref 134–144)
Total Protein: 6.5 g/dL (ref 6.0–8.5)
eGFR: 49 mL/min/{1.73_m2} — ABNORMAL LOW (ref >59)

## 2022-07-31 LAB — CBC WITH DIFFERENTIAL/PLATELET
Basophils Absolute: 0 10*3/uL (ref 0.0–0.2)
Basos: 1 %
EOS (ABSOLUTE): 0.1 10*3/uL (ref 0.0–0.4)
Eos: 2 %
Hematocrit: 40.2 % (ref 34.0–46.6)
Hemoglobin: 12.9 g/dL (ref 11.1–15.9)
Immature Grans (Abs): 0 10*3/uL (ref 0.0–0.1)
Immature Granulocytes: 0 %
Lymphocytes Absolute: 2.1 10*3/uL (ref 0.7–3.1)
Lymphs: 37 %
MCH: 26.2 pg — ABNORMAL LOW (ref 26.6–33.0)
MCHC: 32.1 g/dL (ref 31.5–35.7)
MCV: 82 fL (ref 79–97)
Monocytes Absolute: 0.6 10*3/uL (ref 0.1–0.9)
Monocytes: 10 %
Neutrophils Absolute: 2.9 10*3/uL (ref 1.4–7.0)
Neutrophils: 50 %
Platelets: 254 10*3/uL (ref 150–450)
RBC: 4.92 x10E6/uL (ref 3.77–5.28)
RDW: 15.2 % (ref 11.7–15.4)
WBC: 5.7 10*3/uL (ref 3.4–10.8)

## 2022-07-31 LAB — LIPID PANEL
Chol/HDL Ratio: 2.7 ratio (ref 0.0–4.4)
Cholesterol, Total: 154 mg/dL (ref 100–199)
HDL: 57 mg/dL (ref >39)
LDL Chol Calc (NIH): 62 mg/dL (ref 0–99)
Triglycerides: 217 mg/dL — ABNORMAL HIGH (ref 0–149)
VLDL Cholesterol Cal: 35 mg/dL (ref 5–40)

## 2022-07-31 LAB — HEMOGLOBIN A1C
Est. average glucose Bld gHb Est-mCnc: 120 mg/dL
Hgb A1c MFr Bld: 5.8 % — ABNORMAL HIGH (ref 4.8–5.6)

## 2022-07-31 NOTE — Progress Notes (Signed)
All labs are normal and stable.  Jessica Little, [address] [place], [place] [address] [phone] (phone) [phone] (fax) [place]

## 2022-08-08 ENCOUNTER — Ambulatory Visit: Payer: Medicare Other | Admitting: Family Medicine

## 2022-08-15 ENCOUNTER — Telehealth: Payer: Self-pay | Admitting: Family Medicine

## 2022-08-15 NOTE — Telephone Encounter (Signed)
Called patient to schedule Medicare Annual Wellness Visit (AWV). Left message for patient to call back and schedule Medicare Annual Wellness Visit (AWV).  Last date of AWV: 08/07/2021  Please schedule an appointment at any time with [facility].  If any questions, please contact me at [phone].  Thank you ,  [place] Direct Dial: [phone]

## 2022-09-05 DIAGNOSIS — R8290 Unspecified abnormal findings in urine: Secondary | ICD-10-CM | POA: Diagnosis not present

## 2022-09-05 DIAGNOSIS — I10 Essential (primary) hypertension: Secondary | ICD-10-CM | POA: Diagnosis not present

## 2022-09-05 DIAGNOSIS — K219 Gastro-esophageal reflux disease without esophagitis: Secondary | ICD-10-CM | POA: Diagnosis not present

## 2022-09-05 DIAGNOSIS — E785 Hyperlipidemia, unspecified: Secondary | ICD-10-CM | POA: Diagnosis not present

## 2022-09-10 DIAGNOSIS — N1831 Chronic kidney disease, stage 3a: Secondary | ICD-10-CM | POA: Diagnosis not present

## 2022-09-10 DIAGNOSIS — I129 Hypertensive chronic kidney disease with stage 1 through stage 4 chronic kidney disease, or unspecified chronic kidney disease: Secondary | ICD-10-CM | POA: Diagnosis not present

## 2022-09-10 DIAGNOSIS — H26491 Other secondary cataract, right eye: Secondary | ICD-10-CM | POA: Diagnosis not present

## 2022-09-10 DIAGNOSIS — E876 Hypokalemia: Secondary | ICD-10-CM | POA: Diagnosis not present

## 2022-09-10 DIAGNOSIS — H43813 Vitreous degeneration, bilateral: Secondary | ICD-10-CM | POA: Diagnosis not present

## 2022-09-10 DIAGNOSIS — H26492 Other secondary cataract, left eye: Secondary | ICD-10-CM | POA: Diagnosis not present

## 2022-09-10 DIAGNOSIS — R809 Proteinuria, unspecified: Secondary | ICD-10-CM | POA: Diagnosis not present

## 2022-09-10 DIAGNOSIS — H353131 Nonexudative age-related macular degeneration, bilateral, early dry stage: Secondary | ICD-10-CM | POA: Diagnosis not present

## 2022-10-08 DIAGNOSIS — H26492 Other secondary cataract, left eye: Secondary | ICD-10-CM | POA: Diagnosis not present

## 2022-10-08 DIAGNOSIS — H353131 Nonexudative age-related macular degeneration, bilateral, early dry stage: Secondary | ICD-10-CM | POA: Diagnosis not present

## 2022-10-17 ENCOUNTER — Other Ambulatory Visit (INDEPENDENT_AMBULATORY_CARE_PROVIDER_SITE_OTHER): Payer: Self-pay | Admitting: Nurse Practitioner

## 2022-10-17 ENCOUNTER — Other Ambulatory Visit: Payer: Self-pay | Admitting: Family Medicine

## 2022-10-17 DIAGNOSIS — E876 Hypokalemia: Secondary | ICD-10-CM

## 2022-10-17 DIAGNOSIS — I6523 Occlusion and stenosis of bilateral carotid arteries: Secondary | ICD-10-CM

## 2022-10-17 NOTE — Telephone Encounter (Signed)
Requested Prescriptions  Pending Prescriptions Disp Refills   potassium chloride (KLOR-CON M) 10 MEQ tablet [Pharmacy Med Name: Potassium Chloride Crys ER 10 MEQ Oral Tablet Extended Release] 90 tablet 0    Sig: Take 1 tablet by mouth once daily     Endocrinology:  Minerals - Potassium Supplementation Failed - 10/17/2022  8:38 AM      Failed - Cr in normal range and within 360 days    Creatinine  Date Value Ref Range Status  04/30/2014 1.75 (H) 0.60 - 1.30 mg/dL Final   Creatinine, Ser  Date Value Ref Range Status  07/30/2022 1.17 (H) 0.57 - 1.00 mg/dL Final         Passed - K in normal range and within 360 days    Potassium  Date Value Ref Range Status  07/30/2022 3.7 3.5 - 5.2 mmol/L Final  04/30/2014 3.3 (L) 3.5 - 5.1 mmol/L Final         Passed - Valid encounter within last 12 months    Recent Outpatient Visits           2 months ago Primary hypertension   [place] [facility] [place] Merita Norton T, FNP   4 months ago Preseptal cellulitis of right lower eyelid   [facility]Rob[facility], Tawanna Cooler, MD   8 months ago Stage 3a chronic kidney disease [facility])   [place] [facility] Jacky Kindle, FNP   10 months ago Chalazion of left upper eyelid   [facility] Jacky Kindle, FNP   11 months ago Primary hypertension   [place] [facility] Merita Norton T, FNP       Future Appointments             In 2 weeks Stoioff, Verna Czech, MD [facility]

## 2022-10-22 ENCOUNTER — Other Ambulatory Visit: Payer: Self-pay | Admitting: Family Medicine

## 2022-10-22 DIAGNOSIS — E876 Hypokalemia: Secondary | ICD-10-CM

## 2022-10-22 DIAGNOSIS — I10 Essential (primary) hypertension: Secondary | ICD-10-CM

## 2022-10-23 ENCOUNTER — Ambulatory Visit (INDEPENDENT_AMBULATORY_CARE_PROVIDER_SITE_OTHER): Payer: Medicare Other

## 2022-10-23 ENCOUNTER — Ambulatory Visit (INDEPENDENT_AMBULATORY_CARE_PROVIDER_SITE_OTHER): Payer: Medicare Other | Admitting: Vascular Surgery

## 2022-10-23 ENCOUNTER — Encounter (INDEPENDENT_AMBULATORY_CARE_PROVIDER_SITE_OTHER): Payer: Self-pay | Admitting: Vascular Surgery

## 2022-10-23 VITALS — BP 123/75 | HR 63 | Resp 18 | Ht 66.0 in | Wt 172.0 lb

## 2022-10-23 DIAGNOSIS — E7800 Pure hypercholesterolemia, unspecified: Secondary | ICD-10-CM

## 2022-10-23 DIAGNOSIS — I6523 Occlusion and stenosis of bilateral carotid arteries: Secondary | ICD-10-CM

## 2022-10-23 DIAGNOSIS — I10 Essential (primary) hypertension: Secondary | ICD-10-CM | POA: Diagnosis not present

## 2022-10-23 DIAGNOSIS — N1831 Chronic kidney disease, stage 3a: Secondary | ICD-10-CM | POA: Diagnosis not present

## 2022-10-23 DIAGNOSIS — R7303 Prediabetes: Secondary | ICD-10-CM

## 2022-10-23 NOTE — Assessment & Plan Note (Signed)
blood pressure control important in reducing the progression of atherosclerotic disease. On appropriate oral medications.  

## 2022-10-23 NOTE — Assessment & Plan Note (Signed)
lipid control important in reducing the progression of atherosclerotic disease. Continue statin therapy  

## 2022-10-23 NOTE — Assessment & Plan Note (Signed)
blood glucose control important in reducing the progression of atherosclerotic disease. Also, involved in wound healing. On appropriate medications.  

## 2022-10-23 NOTE — Assessment & Plan Note (Signed)
Carotid duplex today reveals stable 1-39% ICA stenosis bilaterally with <50% right CCA stenosis as well.  No role for intervention. Continue ASA and statin agent.  Recheck in one year.

## 2022-10-23 NOTE — Progress Notes (Signed)
MRN : [id number]  Jessica Little is a 77 y.o. ([birth year]) female who presents with chief complaint of  Chief Complaint  Patient presents with   Follow-up    1 year carotid  .  History of Present Illness: Patient returns in follow-up of her carotid disease.  She is doing well today.  She denies any focal neurologic symptoms. Specifically, the patient denies amaurosis fugax, speech or swallowing difficulties, or arm or leg weakness or numbness. Carotid duplex today reveals stable 1-39% ICA stenosis bilaterally with <50% right CCA stenosis as well.    Current Outpatient Medications  Medication Sig Dispense Refill   amLODipine (NORVASC) 5 MG tablet Take 1 tablet (5 mg total) by mouth daily. 90 tablet 1   aspirin EC 81 MG tablet Take 81 mg by mouth daily. Swallow whole.     atenolol (TENORMIN) 50 MG tablet Take 1 tablet (50 mg total) by mouth daily. 90 tablet 1   atorvastatin (LIPITOR) 40 MG tablet Take 1 tablet (40 mg total) by mouth daily. 90 tablet 3   B Complex-C (B-COMPLEX WITH VITAMIN C) tablet Take 1 tablet by mouth daily.     Biotin 40981 MCG TABS Take by mouth.     Calcium-Magnesium-Zinc 500-250-12.5 MG TABS Take 1 tablet by mouth daily. Dose unknown     Cholecalciferol (VITAMIN D3 PO) Take by mouth daily.     erythromycin ophthalmic ointment Place 1 Application into the left eye at bedtime. 1 cm thin ribbon 3.5 g 0   Flaxseed, Linseed, (FLAXSEED OIL) 1000 MG CAPS Take by mouth.     lisinopril (ZESTRIL) 20 MG tablet Take 2 tablets (40 mg total) by mouth daily. 180 tablet 1   nortriptyline (PAMELOR) 50 MG capsule Take 1 capsule (50 mg total) by mouth at bedtime. 90 capsule 3   omeprazole (PRILOSEC) 40 MG capsule Take 1 capsule (40 mg total) by mouth 2 (two) times daily. 180 capsule 1   perphenazine (TRILAFON) 4 MG tablet TAKE 1 TABLET BY MOUTH AT BEDTIME 90 tablet 3   potassium chloride (KLOR-CON M) 10 MEQ tablet Take 1 tablet by mouth once daily 90 tablet 0   vitamin E 1000 UNIT  capsule Take 1,000 Units by mouth daily.     No current facility-administered medications for this visit.    Past Medical History:  Diagnosis Date   Anxiety    GERD (gastroesophageal reflux disease)    Hyperlipidemia    Hypertension     Past Surgical History:  Procedure Laterality Date   CATARACT EXTRACTION W/PHACO Left 01/18/2021   Procedure: CATARACT EXTRACTION PHACO AND INTRAOCULAR LENS PLACEMENT (IOC) LEFT 7.40 01:11.7;  Surgeon: Lockie Mola, MD;  Location: [facility];  Service: Ophthalmology;  Laterality: Left;   CATARACT EXTRACTION W/PHACO Right 02/01/2021   Procedure: CATARACT EXTRACTION PHACO AND INTRAOCULAR LENS PLACEMENT (IOC) RIGHT EYHANCE TORIC 8.60 01:09.9 ;  Surgeon: Lockie Mola, MD;  Location: [facility];  Service: Ophthalmology;  Laterality: Right;   CHOLECYSTECTOMY     COLONOSCOPY  01/01/2006   COLONOSCOPY WITH PROPOFOL N/A 08/23/2015   Procedure: COLONOSCOPY WITH PROPOFOL;  Surgeon: Earline Mayotte, MD;  Location: [facility];  Service: Endoscopy;  Laterality: N/A;   heart monitor Right 10/22/2021   HIATAL HERNIA REPAIR  2019     Social History   Tobacco Use   Smoking status: Never   Smokeless tobacco: Never  Vaping Use   Vaping Use: Never used  Substance Use Topics   Alcohol use:  No   Drug use: No       Family History  Problem Relation Age of Onset   Arrhythmia Mother    Heart attack Father    Stroke Father    Depression Sister    Hypertension Sister    Healthy Brother    Breast cancer Neg Hx      No Known Allergies   REVIEW OF SYSTEMS (Negative unless checked)  Constitutional: [] Weight loss  [] Fever  [] Chills Cardiac: [] Chest pain   [] Chest pressure   [] Palpitations   [] Shortness of breath when laying flat   [] Shortness of breath at rest   [] Shortness of breath with exertion. Vascular:  [] Pain in legs with walking   [] Pain in legs at rest   [] Pain in legs when laying flat   [] Claudication    [] Pain in feet when walking  [] Pain in feet at rest  [] Pain in feet when laying flat   [] History of DVT   [] Phlebitis   [] Swelling in legs   [] Varicose veins   [] Non-healing ulcers Pulmonary:   [] Uses home oxygen   [] Productive cough   [] Hemoptysis   [] Wheeze  [] COPD   [] Asthma Neurologic:  [] Dizziness  [] Blackouts   [] Seizures   [] History of stroke   [] History of TIA  [] Aphasia   [] Temporary blindness   [] Dysphagia   [] Weakness or numbness in arms   [] Weakness or numbness in legs Musculoskeletal:  [x] Arthritis   [] Joint swelling   [] Joint pain   [] Low back pain Hematologic:  [] Easy bruising  [] Easy bleeding   [] Hypercoagulable state   [] Anemic  [] Hepatitis Gastrointestinal:  [] Blood in stool   [] Vomiting blood  [x] Gastroesophageal reflux/heartburn   [] Difficulty swallowing. Genitourinary:  [] Chronic kidney disease   [] Difficult urination  [] Frequent urination  [] Burning with urination   [] Blood in urine Skin:  [] Rashes   [] Ulcers   [] Wounds Psychological:  [x] History of anxiety   []  History of major depression.  Physical Examination  Vitals:   10/23/22 1339  BP: 123/75  Pulse: 63  Resp: 18  Weight: 172 lb (78 kg)  Height: 5\' 6"  (1.676 m)   Body mass index is 27.76 kg/m. Gen:  WD/WN, NAD. Appears younger than stated age.  Head: [place]/AT, No temporalis wasting. Ear/Nose/Throat: Hearing grossly intact, nares w/o erythema or drainage, trachea midline Eyes: Conjunctiva clear. Sclera non-icteric Neck: Supple.  No bruit  Pulmonary:  Good air movement, equal and clear to auscultation bilaterally.  Cardiac: RRR, No JVD Vascular:  Vessel Right Left  Radial Palpable Palpable       Musculoskeletal: M/S 5/5 throughout.  No deformity or atrophy. No edema. Neurologic: CN 2-12 intact. Sensation grossly intact in extremities.  Symmetrical.  Speech is fluent. Motor exam as listed above. Psychiatric: Judgment intact, Mood & affect appropriate for pt's clinical situation. Dermatologic: No rashes or  ulcers noted.  No cellulitis or open wounds.     CBC Lab Results  Component Value Date   WBC 5.7 07/30/2022   HGB 12.9 07/30/2022   HCT 40.2 07/30/2022   MCV 82 07/30/2022   PLT 254 07/30/2022    BMET    Component Value Date/Time   NA 142 07/30/2022 1502   NA 144 04/30/2014 1321   K 3.7 07/30/2022 1502   K 3.3 (L) 04/30/2014 1321   CL 103 07/30/2022 1502   CL 102 04/30/2014 1321   CO2 25 07/30/2022 1502   CO2 28 04/30/2014 1321   GLUCOSE 103 (H) 07/30/2022 1502   GLUCOSE 114 (H)  09/24/2021 0436   GLUCOSE 120 (H) 04/30/2014 1321   BUN 21 07/30/2022 1502   BUN 37 (H) 04/30/2014 1321   CREATININE 1.17 (H) 07/30/2022 1502   CREATININE 1.75 (H) 04/30/2014 1321   CALCIUM 9.2 07/30/2022 1502   CALCIUM 8.8 04/30/2014 1321   GFRNONAA 43 (L) 09/24/2021 0436   GFRNONAA 31 (L) 04/30/2014 1321   GFRNONAA 26 (L) 02/01/2014 1705   GFRAA 46 (L) 05/23/2020 1434   GFRAA 37 (L) 04/30/2014 1321   GFRAA 30 (L) 02/01/2014 1705   CrCl cannot be calculated (Patient's most recent lab result is older than the maximum 21 days allowed.).  COAG No results found for: "INR", "PROTIME"  Radiology No results found.   Assessment/Plan Primary hypertension blood pressure control important in reducing the progression of atherosclerotic disease. On appropriate oral medications.   HLD (hyperlipidemia) lipid control important in reducing the progression of atherosclerotic disease. Continue statin therapy   Prediabetes blood glucose control important in reducing the progression of atherosclerotic disease. Also, involved in wound healing. On appropriate medications.   Carotid stenosis, asymptomatic, bilateral Carotid duplex today reveals stable 1-39% ICA stenosis bilaterally with <50% right CCA stenosis as well.  No role for intervention. Continue ASA and statin agent.  Recheck in one year.     Festus Barren, MD  10/23/2022 2:05 PM    This note was created with Dragon medical  transcription system.  Any errors from dictation are purely unintentional

## 2022-10-24 NOTE — Telephone Encounter (Signed)
Requested Prescriptions  Pending Prescriptions Disp Refills   lisinopril (ZESTRIL) 20 MG tablet [Pharmacy Med Name: Lisinopril 20 MG Oral Tablet] 180 tablet 0    Sig: Take 2 tablets by mouth once daily     Cardiovascular:  ACE Inhibitors Failed - 10/22/2022  3:28 PM      Failed - Cr in normal range and within 180 days    Creatinine  Date Value Ref Range Status  04/30/2014 1.75 (H) 0.60 - 1.30 mg/dL Final   Creatinine, Ser  Date Value Ref Range Status  07/30/2022 1.17 (H) 0.57 - 1.00 mg/dL Final         Passed - K in normal range and within 180 days    Potassium  Date Value Ref Range Status  07/30/2022 3.7 3.5 - 5.2 mmol/L Final  04/30/2014 3.3 (L) 3.5 - 5.1 mmol/L Final         Passed - Patient is not pregnant      Passed - Last BP in normal range    BP Readings from Last 1 Encounters:  10/23/22 123/75         Passed - Valid encounter within last 6 months    Recent Outpatient Visits           2 months ago Primary hypertension   [place] [facility] Merita Norton T, FNP   5 months ago Preseptal cellulitis of right lower eyelid   [place] [facility] Simmons-Robinson, [place], MD   8 months ago Stage 3a chronic kidney disease [facility])   [place] [facility] Merita Norton T, FNP   10 months ago Chalazion of left upper eyelid   [place] [facility] Merita Norton T, FNP   11 months ago Primary hypertension   [place] [facility] Merita Norton T, FNP       Future Appointments             In 1 week Stoioff, Verna Czech, MD [facility] [place]             potassium chloride (KLOR-CON M) 10 MEQ tablet [Pharmacy Med Name: Potassium Chloride Crys ER 10 MEQ Oral Tablet Extended Release] 90 tablet 0    Sig: Take 1 tablet by mouth once daily     Endocrinology:  Minerals - Potassium Supplementation Failed - 10/22/2022  3:28 PM      Failed - Cr in normal range and within 360 days     Creatinine  Date Value Ref Range Status  04/30/2014 1.75 (H) 0.60 - 1.30 mg/dL Final   Creatinine, Ser  Date Value Ref Range Status  07/30/2022 1.17 (H) 0.57 - 1.00 mg/dL Final         Passed - K in normal range and within 360 days    Potassium  Date Value Ref Range Status  07/30/2022 3.7 3.5 - 5.2 mmol/L Final  04/30/2014 3.3 (L) 3.5 - 5.1 mmol/L Final         Passed - Valid encounter within last 12 months    Recent Outpatient Visits           2 months ago Primary hypertension   [place] [facility] Merita Norton T, FNP   5 months ago Preseptal cellulitis of right lower eyelid   [place] [facility] Simmons-Robinson, Tawanna Cooler, MD   8 months ago Stage 3a chronic kidney disease [facility])   [facility] Jacky Kindle, [place]  10 months ago Chalazion of left upper eyelid   [facility] Jacky Kindle, FNP   11 months ago Primary hypertension   [place] [facility] Merita Norton T, FNP       Future Appointments             In 1 week Stoioff, Verna Czech, MD [facility]

## 2022-10-29 ENCOUNTER — Encounter: Payer: Self-pay | Admitting: Psychiatry

## 2022-10-29 ENCOUNTER — Ambulatory Visit (INDEPENDENT_AMBULATORY_CARE_PROVIDER_SITE_OTHER): Payer: Medicare Other | Admitting: Psychiatry

## 2022-10-29 ENCOUNTER — Telehealth: Payer: Self-pay | Admitting: Psychiatry

## 2022-10-29 DIAGNOSIS — F3342 Major depressive disorder, recurrent, in full remission: Secondary | ICD-10-CM | POA: Diagnosis not present

## 2022-10-29 DIAGNOSIS — F411 Generalized anxiety disorder: Secondary | ICD-10-CM

## 2022-10-29 DIAGNOSIS — F251 Schizoaffective disorder, depressive type: Secondary | ICD-10-CM | POA: Diagnosis not present

## 2022-10-29 DIAGNOSIS — F4001 Agoraphobia with panic disorder: Secondary | ICD-10-CM | POA: Diagnosis not present

## 2022-10-29 MED ORDER — NORTRIPTYLINE HCL 50 MG PO CAPS
50.0000 mg | ORAL_CAPSULE | Freq: Every day | ORAL | 3 refills | Status: AC
Start: 2022-10-29 — End: ?

## 2022-10-29 MED ORDER — PERPHENAZINE 4 MG PO TABS
ORAL_TABLET | ORAL | 3 refills | Status: DC
Start: 2022-10-29 — End: 2022-11-18

## 2022-10-29 NOTE — Telephone Encounter (Signed)
[facility] sent PA request for Perphenazine 4mg   , see CMM

## 2022-10-29 NOTE — Progress Notes (Signed)
Jessica Little [id number] 1945/09/05 77 y.o.  Subjective:   Patient ID:  Jessica Little is a 77 y.o. (DOB 1946-02-10) female.  Chief Complaint:  Chief Complaint  Patient presents with   Follow-up   Depression   Anxiety    HPI Jessica Little Uno presents to the office today for follow-up of depression with psychotic features.  Hosp April 2019 for 4 weeks after hernia surgery and then had agitated delirium.  Per Dr. Jamse Mead psychiatry at [facility] she also had a prolonged QTC of 593.  She was discharged from the hospital on nortriptyline 50 and perphenazine 4 mg nightly and lorazepam 1 mg twice daily .   then rehab.  A lot of med changes. Came off lorazepam in the hospital but doesn't remember it.  No psychosis off the meds.  Weaned off perphenazine too and on less nortriptyline and doing ok.  seen January 2020 and 06/22/19.  No meds were changed.  Has remained on nortriptyline 50 HS and perphenazine 4 HS. She was doing well at the time.  07/04/2020 appointment with the following noted: Sister came to see her in August and thinking about moving back to [place] to be with sisters and brother.  Maybe in the summer.    Doing great.  God's healed me of panic.  No depression.  Patient reports stable mood and denies depressed or irritable moods.  Patient denies any recent difficulty with anxiety.  Patient reports variable difficulty with sleep initiation or maintenance. No trazodone anymore   Denies appetite disturbance.  Patient reports that energy and motivation have been good.  Patient denies any difficulty with concentration.  Patient denies any suicidal ideation. Plan: Doing well with only perphenazine 4 HS Nortriptyline 50 HS. Off lorazepam for a couple of years No med changes indicated. High relapse risk without it.  10/16/2021 appointment with the following noted: Had planned to move back to [place] but hasn't yet. Orthostatic hypotension by her report and has been seen recently. depression  and anxiety are not a problem at all.  Patient reports stable mood and denies depressed or irritable moods.  Patient denies any recent difficulty with anxiety.  . Denies appetite disturbance.  Patient reports that energy and motivation have been good.  Patient denies any difficulty with concentration.  Patient denies any suicidal ideation. Occ EMA several days per week.  No sig napping usually unless it's brief.  Most of the time is fine. No problems with psych meds. On waiting list for [facility] in [place]. Plan: Doing well with only perphenazine 4 HS Nortriptyline 50 HS. 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  But not likely the cause. Off lorazepam for a couple of years  10/29/22 appt noted: Doing well.  Drove herself here alone.  No further fainting episodes in the last year. Work up without evidence for SZ.  Carotid stenosis is felt manageable.  Implanted heart monitor. Overall I think I'm doing great.  Was low K. Taking it and no further problems. No panic.  Patient reports stable mood and denies depressed or irritable moods.  Patient denies any recent difficulty with anxiety.  Patient denies difficulty with sleep initiation or maintenance. Denies appetite disturbance.  Patient reports that energy and motivation have been good.  Patient denies any difficulty with concentration.  Patient denies any suicidal ideation. No fear nor paranoia nor hallucinations like in the past. Still misses her H.  He died 05/30/2005.  Oldest sister died 29-Aug-2022.    Past  Psychiatric Medication Trials: Nortriptyline 100,  perphenazine 16 mg daily but mostly 8 mg over the last 10 years,  Geodon 160, olanzapine 20, quetiapine 300, Haldol 15 lorazepam 1 mg 4 times daily, atenolol,  Under the care of [facility] since July 1996  Review of Systems:  Review of Systems  Respiratory:  Negative for chest tightness.   Gastrointestinal:  Negative for abdominal distention.   Musculoskeletal:  Positive for gait problem.  Neurological:  Positive for tremors and light-headedness.  Psychiatric/Behavioral:  Negative for agitation, behavioral problems, confusion, decreased concentration, dysphoric mood, hallucinations, self-injury, sleep disturbance and suicidal ideas. The patient is nervous/anxious. The patient is not hyperactive.     Medications: I have reviewed the patient's current medications.  Current Outpatient Medications  Medication Sig Dispense Refill   amLODipine (NORVASC) 5 MG tablet Take 1 tablet (5 mg total) by mouth daily. 90 tablet 1   aspirin EC 81 MG tablet Take 81 mg by mouth daily. Swallow whole.     atenolol (TENORMIN) 50 MG tablet Take 1 tablet (50 mg total) by mouth daily. 90 tablet 1   atorvastatin (LIPITOR) 40 MG tablet Take 1 tablet (40 mg total) by mouth daily. 90 tablet 3   B Complex-C (B-COMPLEX WITH VITAMIN C) tablet Take 1 tablet by mouth daily.     Biotin 16109 MCG TABS Take by mouth.     Calcium-Magnesium-Zinc 500-250-12.5 MG TABS Take 1 tablet by mouth daily. Dose unknown     Cholecalciferol (VITAMIN D3 PO) Take by mouth daily.     erythromycin ophthalmic ointment Place 1 Application into the left eye at bedtime. 1 cm thin ribbon 3.5 g 0   Flaxseed, Linseed, (FLAXSEED OIL) 1000 MG CAPS Take by mouth.     lisinopril (ZESTRIL) 20 MG tablet Take 2 tablets by mouth once daily 180 tablet 0   omeprazole (PRILOSEC) 40 MG capsule Take 1 capsule (40 mg total) by mouth 2 (two) times daily. 180 capsule 1   potassium chloride (KLOR-CON M) 10 MEQ tablet Take 1 tablet by mouth once daily 90 tablet 0   vitamin E 1000 UNIT capsule Take 1,000 Units by mouth daily.     nortriptyline (PAMELOR) 50 MG capsule Take 1 capsule (50 mg total) by mouth at bedtime. 90 capsule 3   perphenazine (TRILAFON) 4 MG tablet TAKE 1 TABLET BY MOUTH AT BEDTIME 90 tablet 3   No current facility-administered medications for this visit.    Medication Side Effects:  none  Allergies: No Known Allergies  Past Medical History:  Diagnosis Date   Anxiety    GERD (gastroesophageal reflux disease)    Hyperlipidemia    Hypertension     Family History  Problem Relation Age of Onset   Arrhythmia Mother    Heart attack Father    Stroke Father    Depression Sister    Hypertension Sister    Healthy Brother    Breast cancer Neg Hx     Social History   Socioeconomic History   Marital status: Widowed    Spouse name: Not on file   Number of children: 0   Years of education: College   Highest education level: Bachelor's degree (e.g., BA, AB, BS)  Occupational History   Occupation: Retired  Tobacco Use   Smoking status: Never   Smokeless tobacco: Never  Vaping Use   Vaping Use: Never used  Substance and Sexual Activity   Alcohol use: No   Drug use: No   Sexual activity:  Not on file  Other Topics Concern   Not on file  Social History Narrative   Right handed   No caffeine   One story home   Social Determinants of Health   Financial Resource Strain: Low Risk  (08/07/2021)   Overall Financial Resource Strain (CARDIA)    Difficulty of Paying Living Expenses: Not hard at all  Food Insecurity: No Food Insecurity (08/07/2021)   Hunger Vital Sign    Worried About Running Out of Food in the Last Year: Never true    Ran Out of Food in the Last Year: Never true  Transportation Needs: No Transportation Needs (08/07/2021)   PRAPARE - Administrator, Civil Service (Medical): No    Lack of Transportation (Non-Medical): No  Physical Activity: Insufficiently Active (08/07/2021)   Exercise Vital Sign    Days of Exercise per Week: 2 days    Minutes of Exercise per Session: 40 min  Stress: No Stress Concern Present (08/07/2021)   [employer] of Occupational Health - Occupational Stress Questionnaire    Feeling of Stress : Not at all  Social Connections: Moderately Isolated (08/07/2021)   Social Connection and Isolation Panel [NHANES]     Frequency of Communication with Friends and Family: Twice a week    Frequency of Social Gatherings with Friends and Family: Three times a week    Attends Religious Services: More than 4 times per year    Active Member of Clubs or Organizations: No    Attends Banker Meetings: Never    Marital Status: Widowed  Intimate Partner Violence: Not At Risk (08/07/2021)   Humiliation, Afraid, Rape, and Kick questionnaire    Fear of Current or Ex-Partner: No    Emotionally Abused: No    Physically Abused: No    Sexually Abused: No    Past Medical History, Surgical history, Social history, and Family history were reviewed and updated as appropriate.   Please see review of systems for further details on the patient's review from today.   Objective:   Physical Exam:  There were no vitals taken for this visit.  Physical Exam Constitutional:      General: She is not in acute distress.    Appearance: She is well-developed.  Musculoskeletal:        General: No deformity.  Neurological:     Mental Status: She is alert and oriented to person, place, and time.     Cranial Nerves: Dysarthria present.     Motor: No tremor.     Coordination: Coordination normal.     Gait: Gait normal.     Comments: Slight dysarthria chronically.  Psychiatric:        Attention and Perception: She is attentive. She does not perceive auditory hallucinations.        Mood and Affect: Mood is not anxious or depressed. Affect is not labile, blunt, angry or inappropriate.        Speech: Speech normal.        Behavior: Behavior normal.        Thought Content: Thought content normal. Thought content is not paranoid or delusional. Thought content does not include homicidal or suicidal ideation. Thought content does not include suicidal plan.        Cognition and Memory: Cognition normal.        Judgment: Judgment normal.     Comments: Insight fair. No auditory or visual hallucinations. No delusions.  Sx well  controlled. euthymic  Lab Review:     Component Value Date/Time   NA 142 07/30/2022 1502   NA 144 04/30/2014 1321   K 3.7 07/30/2022 1502   K 3.3 (L) 04/30/2014 1321   CL 103 07/30/2022 1502   CL 102 04/30/2014 1321   CO2 25 07/30/2022 1502   CO2 28 04/30/2014 1321   GLUCOSE 103 (H) 07/30/2022 1502   GLUCOSE 114 (H) 09/24/2021 0436   GLUCOSE 120 (H) 04/30/2014 1321   BUN 21 07/30/2022 1502   BUN 37 (H) 04/30/2014 1321   CREATININE 1.17 (H) 07/30/2022 1502   CREATININE 1.75 (H) 04/30/2014 1321   CALCIUM 9.2 07/30/2022 1502   CALCIUM 8.8 04/30/2014 1321   PROT 6.5 07/30/2022 1502   PROT 7.6 04/30/2014 1321   ALBUMIN 4.3 07/30/2022 1502   ALBUMIN 3.9 04/30/2014 1321   AST 26 07/30/2022 1502   AST 32 04/30/2014 1321   ALT 25 07/30/2022 1502   ALT 27 04/30/2014 1321   ALKPHOS 88 07/30/2022 1502   ALKPHOS 73 04/30/2014 1321   BILITOT 0.4 07/30/2022 1502   BILITOT 0.6 04/30/2014 1321   GFRNONAA 43 (L) 09/24/2021 0436   GFRNONAA 31 (L) 04/30/2014 1321   GFRNONAA 26 (L) 02/01/2014 1705   GFRAA 46 (L) 05/23/2020 1434   GFRAA 37 (L) 04/30/2014 1321   GFRAA 30 (L) 02/01/2014 1705       Component Value Date/Time   WBC 5.7 07/30/2022 1502   WBC 7.5 09/24/2021 0436   RBC 4.92 07/30/2022 1502   RBC 4.61 09/24/2021 0436   HGB 12.9 07/30/2022 1502   HCT 40.2 07/30/2022 1502   PLT 254 07/30/2022 1502   MCV 82 07/30/2022 1502   MCV 81 04/30/2014 1340   MCH 26.2 (L) 07/30/2022 1502   MCH 25.8 (L) 09/24/2021 0436   MCHC 32.1 07/30/2022 1502   MCHC 31.3 09/24/2021 0436   RDW 15.2 07/30/2022 1502   RDW 16.7 (H) 04/30/2014 1340   LYMPHSABS 2.1 07/30/2022 1502   LYMPHSABS 1.8 04/30/2014 1340   MONOABS 1.3 (H) 04/30/2014 1340   EOSABS 0.1 07/30/2022 1502   EOSABS 0.0 04/30/2014 1340   BASOSABS 0.0 07/30/2022 1502   BASOSABS 0.1 04/30/2014 1340    No results found for: "POCLITH", "LITHIUM"   10/20/2021 nortriptyline level 96 on 50 mg daily.  .res Assessment: Plan:     Schizoaffective disorder, depressive type (HCC) - Plan: nortriptyline (PAMELOR) 50 MG capsule, perphenazine (TRILAFON) 4 MG tablet  Panic disorder with agoraphobia - Plan: nortriptyline (PAMELOR) 50 MG capsule  Generalized anxiety disorder - Plan: nortriptyline (PAMELOR) 50 MG capsule  Major depression, recurrent, full remission (HCC) - Plan: nortriptyline (PAMELOR) 50 MG capsule   30 min face to face time with patient was spent on counseling and coordination of care. We discussed Patient has a history of schizoaffective disorder generalized anxiety disorder and panic disorder and had been stable for many years.  She had a medical hospitalization for surgery and developed psychotic delirium in April 2019.  During that time in her recovery her nortriptyline dose was reduced by 50% to 50 mg daily, her perphenazine was discontinued and she was weaned off lorazepam.  She had no occasions of relapse of psychotic symptoms since being off the perphenazine so we will leave her off that.  Her depression has been stable with a lower dose no of nortriptyline so we will would not renew that.  Her panic disorder and has not recurred so we will not restart any benzodiazepine.  She  will let us know if she has any recurrence of symptoms.  She has had repeated syncopal episodes.  From reviewing the notes it does not appear to be related to any cardiac conduction delay problems and therefore unlikely related to nortriptyline or any other psychiatric medicine.  I have reviewed the cardiology notes and cardiology is not suggesting any concerns about psychiatric medicines either. Reports it's due to orthostatic hypotension.  Doing well with only perphenazine 4 HS Nortriptyline 50 HS. 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 10/20/2021 nortriptyline level 96 on 50 mg daily.  But not likely the cause. Off lorazepam for a couple of years  No med changes indicated. High relapse risk without it.  Doses are low.  Discussed potential metabolic side effects associated with atypical antipsychotics, as well as potential risk for movement side effects. Advised pt to contact office if movement side effects occur.   Call if any relapses.    Follow-up 12 months  Meredith Staggers, MD, DFAPA  Please see After Visit Summary for patient specific instructions.  Future Appointments  Date Time Provider Department Center  10/31/2022  1:30 PM [facility]NUAL WELLNESS VISIT [facility]  11/05/2022 10:15 AM Stoioff, Verna Czech, MD [facility] None  10/22/2023  1:00 PM [facility] VASC 1 [facility] None  10/22/2023  2:00 PM Georgiana Spinner, NP [facility] None    No orders of the defined types were placed in this encounter.     -------------------------------

## 2022-10-31 ENCOUNTER — Ambulatory Visit (INDEPENDENT_AMBULATORY_CARE_PROVIDER_SITE_OTHER): Payer: Medicare Other

## 2022-10-31 VITALS — Ht 66.0 in | Wt 169.0 lb

## 2022-10-31 DIAGNOSIS — Z0000 Encounter for general adult medical examination without abnormal findings: Secondary | ICD-10-CM | POA: Diagnosis not present

## 2022-10-31 NOTE — Progress Notes (Signed)
I connected with  Jessica Little on 10/31/22 by a audio enabled telemedicine application and verified that I am speaking with the correct person using two identifiers.  Patient Location: Home  Provider Location: Office/Clinic  I discussed the limitations of evaluation and management by telemedicine. The patient expressed understanding and agreed to proceed.  Subjective:   Jessica Little is a 77 y.o. female who presents for Medicare Annual (Subsequent) preventive examination.  Review of Systems    Cardiac Risk Factors include: advanced age (>49men, >75 women);dyslipidemia;hypertension    Objective:    Today's Vitals   10/31/22 1325  Weight: 169 lb (76.7 kg)  Height: 5\' 6"  (1.676 m)   Body mass index is 27.28 kg/m.     10/31/2022    1:30 PM 12/12/2021    2:18 PM 11/08/2021   11:02 AM 09/23/2021    4:49 PM 09/22/2021   11:45 AM 08/07/2021    1:07 PM 07/05/2021   10:05 AM  Advanced Directives  Does Patient Have a Medical Advance Directive? Yes No No No No No No  Type of Estate agent of [place];Living will        Would patient like information on creating a medical advance directive?   No - Patient declined No - Patient declined  No - Patient declined     Current Medications (verified) Outpatient Encounter Medications as of 10/31/2022  Medication Sig   amLODipine (NORVASC) 5 MG tablet Take 1 tablet (5 mg total) by mouth daily.   aspirin EC 81 MG tablet Take 81 mg by mouth daily. Swallow whole.   atenolol (TENORMIN) 50 MG tablet Take 1 tablet (50 mg total) by mouth daily.   atorvastatin (LIPITOR) 40 MG tablet Take 1 tablet (40 mg total) by mouth daily.   B Complex-C (B-COMPLEX WITH VITAMIN C) tablet Take 1 tablet by mouth daily.   Biotin 16109 MCG TABS Take by mouth.   Calcium-Magnesium-Zinc 500-250-12.5 MG TABS Take 1 tablet by mouth daily. Dose unknown   Cholecalciferol (VITAMIN D3 PO) Take by mouth daily.   erythromycin ophthalmic ointment Place 1  Application into the left eye at bedtime. 1 cm thin ribbon   Flaxseed, Linseed, (FLAXSEED OIL) 1000 MG CAPS Take by mouth.   lisinopril (ZESTRIL) 20 MG tablet Take 2 tablets by mouth once daily   nortriptyline (PAMELOR) 50 MG capsule Take 1 capsule (50 mg total) by mouth at bedtime.   omeprazole (PRILOSEC) 40 MG capsule Take 1 capsule (40 mg total) by mouth 2 (two) times daily.   perphenazine (TRILAFON) 4 MG tablet TAKE 1 TABLET BY MOUTH AT BEDTIME   potassium chloride (KLOR-CON M) 10 MEQ tablet Take 1 tablet by mouth once daily   vitamin E 1000 UNIT capsule Take 1,000 Units by mouth daily.   No facility-administered encounter medications on file as of 10/31/2022.    Allergies (verified) Patient has no known allergies.   History: Past Medical History:  Diagnosis Date   Anxiety    GERD (gastroesophageal reflux disease)    Hyperlipidemia    Hypertension    Past Surgical History:  Procedure Laterality Date   CATARACT EXTRACTION W/PHACO Left 01/18/2021   Procedure: CATARACT EXTRACTION PHACO AND INTRAOCULAR LENS PLACEMENT (IOC) LEFT 7.40 01:11.7;  Surgeon: Lockie Mola, MD;  Location: [facility];  Service: Ophthalmology;  Laterality: Left;   CATARACT EXTRACTION W/PHACO Right 02/01/2021   Procedure: CATARACT EXTRACTION PHACO AND INTRAOCULAR LENS PLACEMENT (IOC) RIGHT EYHANCE TORIC 8.60 01:09.9 ;  Surgeon: Inez Pilgrim,  Radene Knee, MD;  Location: [facility];  Service: Ophthalmology;  Laterality: Right;   CHOLECYSTECTOMY     COLONOSCOPY  01/01/2006   COLONOSCOPY WITH PROPOFOL N/A 08/23/2015   Procedure: COLONOSCOPY WITH PROPOFOL;  Surgeon: Earline Mayotte, MD;  Location: [facility];  Service: Endoscopy;  Laterality: N/A;   heart monitor Right 10/22/2021   HIATAL HERNIA REPAIR  2019   Family History  Problem Relation Age of Onset   Arrhythmia Mother    Heart attack Father    Stroke Father    Depression Sister    Hypertension Sister    Healthy Brother     Breast cancer Neg Hx    Social History   Socioeconomic History   Marital status: Widowed    Spouse name: Not on file   Number of children: 0   Years of education: College   Highest education level: Bachelor's degree (e.g., BA, AB, BS)  Occupational History   Occupation: Retired  Tobacco Use   Smoking status: Never   Smokeless tobacco: Never  Vaping Use   Vaping Use: Never used  Substance and Sexual Activity   Alcohol use: No   Drug use: No   Sexual activity: Not on file  Other Topics Concern   Not on file  Social History Narrative   Right handed   No caffeine   One story home   Social Determinants of Health   Financial Resource Strain: Low Risk  (10/31/2022)   Overall Financial Resource Strain (CARDIA)    Difficulty of Paying Living Expenses: Not hard at all  Food Insecurity: No Food Insecurity (10/31/2022)   Hunger Vital Sign    Worried About Running Out of Food in the Last Year: Never true    Ran Out of Food in the Last Year: Never true  Transportation Needs: No Transportation Needs (10/31/2022)   PRAPARE - Administrator, Civil Service (Medical): No    Lack of Transportation (Non-Medical): No  Physical Activity: Sufficiently Active (10/31/2022)   Exercise Vital Sign    Days of Exercise per Week: 5 days    Minutes of Exercise per Session: 30 min  Stress: No Stress Concern Present (10/31/2022)   [employer] of Occupational Health - Occupational Stress Questionnaire    Feeling of Stress : Not at all  Social Connections: Moderately Isolated (10/31/2022)   Social Connection and Isolation Panel [NHANES]    Frequency of Communication with Friends and Family: More than three times a week    Frequency of Social Gatherings with Friends and Family: Three times a week    Attends Religious Services: More than 4 times per year    Active Member of Clubs or Organizations: No    Attends Banker Meetings: Never    Marital Status: Widowed    Tobacco  Counseling Counseling given: Not Answered   Clinical Intake:  Pre-visit preparation completed: Yes  Pain : No/denies pain   BMI - recorded: 27.28 Nutritional Status: BMI 25 -29 Overweight Nutritional Risks: None Diabetes: No  How often do you need to have someone help you when you read instructions, pamphlets, or other written materials from your doctor or pharmacy?: 1 - Never  Diabetic?no  Interpreter Needed?: No  Comments: lives alone Information entered by :: B.Barabara Motz,LPN   Activities of Daily Living    10/31/2022    1:30 PM 07/30/2022    2:29 PM  In your present state of health, do you have any difficulty performing the following activities:  Hearing? 0 0  Vision? 0 0  Difficulty concentrating or making decisions? 0 0  Walking or climbing stairs? 0 0  Dressing or bathing? 0 0  Doing errands, shopping? 0 0  Preparing Food and eating ? N   Using the Toilet? N   In the past six months, have you accidently leaked urine? N   Do you have problems with loss of bowel control? N   Managing your Medications? N   Managing your Finances? N   Housekeeping or managing your Housekeeping? N     Patient Care Team: Jacky Kindle, FNP as PCP - General (Family Medicine) Lanier Prude, MD as PCP - Electrophysiology (Cardiology) Debbe Odea, MD as PCP - Cardiology (Cardiology) Cottle, Steva Ready., MD as Attending Physician (Psychiatry) [facility], [facility])  Indicate any recent Medical Services you may have received from other than [facility] providers in the past year (date may be approximate).     Assessment:   This is a routine wellness examination for Karma.  Hearing/Vision screen Hearing Screening - Comments:: Adequate hearing Vision Screening - Comments:: Adequate vision w/readers only after cataract surgery Dr Inez Pilgrim  Dietary issues and exercise activities discussed: Exercise limited by: psychological condition(s);cardiac condition(s)    Goals Addressed             This Visit's Progress    DIET - EAT MORE FRUITS AND VEGETABLES   On track    DIET - INCREASE WATER INTAKE   On track    Recommend increasing water intake to 6-8 8 oz glasses a day.        Depression Screen    10/31/2022    1:28 PM 07/30/2022    2:29 PM 05/25/2022    3:00 PM 02/07/2022   10:42 AM 11/06/2021    3:42 PM 10/06/2021    2:18 PM 08/07/2021    1:04 PM  PHQ 2/9 Scores  PHQ - 2 Score 0 0 0 0 0 0 0  PHQ- 9 Score  0 0 0 0 1 1    Fall Risk    10/31/2022    1:26 PM 07/30/2022    2:29 PM 05/25/2022    3:00 PM 02/07/2022   10:42 AM 12/12/2021    2:18 PM  Fall Risk   Falls in the past year?  0 0 0 1  Number falls in past yr:  0 0 0 1  Injury with Fall?  0 0 0 1  Risk for fall due to : No Fall Risks  No Fall Risks No Fall Risks   Follow up Education provided;Falls prevention discussed   Falls evaluation completed     FALL RISK PREVENTION PERTAINING TO THE HOME:  Any stairs in or around the home? No  If so, are there any without handrails? No  Home free of loose throw rugs in walkways, pet beds, electrical cords, etc? Yes  Adequate lighting in your home to reduce risk of falls? Yes   ASSISTIVE DEVICES UTILIZED TO PREVENT FALLS:  Life alert? No  Use of a cane, walker or w/c? No  Grab bars in the bathroom? Yes  Shower chair or bench in shower? No  Elevated toilet seat or a handicapped toilet? No   Cognitive Function:        10/31/2022    1:33 PM 10/05/2016   11:25 AM  6CIT Screen  What Year? 0 points 0 points  What month? 0 points 0 points  What time? 0 points  0 points  Count back from 20 0 points 0 points  Months in reverse 0 points 0 points  Repeat phrase 0 points 2 points  Total Score 0 points 2 points    Immunizations Immunization History  Administered Date(s) Administered   Hepatitis A 08/13/2001   Moderna Sars-Covid-2 Vaccination 10/14/2019, 11/11/2019   Pneumococcal Conjugate-13 07/06/2015   Pneumococcal Polysaccharide-23  10/05/2016   Td 10/24/2000    TDAP status: Up to date  Flu Vaccine status: Declined, Education has been provided regarding the importance of this vaccine but patient still declined. Advised may receive this vaccine at local pharmacy or Health Dept. Aware to provide a copy of the vaccination record if obtained from local pharmacy or Health Dept. Verbalized acceptance and understanding.  Pneumococcal vaccine status: Up to date  Covid-19 vaccine status: Completed vaccines  Qualifies for Shingles Vaccine? Yes   Zostavax completed No   Shingrix Completed?: No.    Education has been provided regarding the importance of this vaccine. Patient has been advised to call insurance company to determine out of pocket expense if they have not yet received this vaccine. Advised may also receive vaccine at local pharmacy or Health Dept. Verbalized acceptance and understanding.  Screening Tests Health Maintenance  Topic Date Due   Zoster Vaccines- Shingrix (1 of 2) Never done   DTaP/Tdap/Td (2 - Tdap) 10/25/2010   DEXA SCAN  01/02/2022   COVID-19 Vaccine (3 - 2023-24 season) 01/26/2022   Medicare Annual Wellness (AWV)  10/31/2023   Colonoscopy  08/22/2025   Pneumonia Vaccine 95+ Years old  Completed   Hepatitis C Screening  Completed   HPV VACCINES  Aged Out    Health Maintenance  Health Maintenance Due  Topic Date Due   Zoster Vaccines- Shingrix (1 of 2) Never done   DTaP/Tdap/Td (2 - Tdap) 10/25/2010   DEXA SCAN  01/02/2022   COVID-19 Vaccine (3 - 2023-24 season) 01/26/2022    Colorectal cancer screening: No longer required.   Mammogram status: No longer required due to age.  Bone Density status: Completed yes. Results reflect: Bone density results: OSTEOPENIA. Repeat every 5 years.  Lung Cancer Screening: (Low Dose CT Chest recommended if Age 20-80 years, 30 pack-year currently smoking OR have quit w/in 15years.) does not qualify.   Lung Cancer Screening Referral: no  Additional  Screening:  Hepatitis C Screening: does not qualify; Completed yes  Vision Screening: Recommended annual ophthalmology exams for early detection of glaucoma and other disorders of the eye. Is the patient up to date with their annual eye exam?  Yes  Who is the provider or what is the name of the office in which the patient attends annual eye exams? Dr Inez Pilgrim If pt is not established with a provider, would they like to be referred to a provider to establish care? No .   Dental Screening: Recommended annual dental exams for proper oral hygiene  Community Resource Referral / Chronic Care Management: CRR required this visit?  No   CCM required this visit?  No    Plan:     I have personally reviewed and noted the following in the patient's chart:   Medical and social history Use of alcohol, tobacco or illicit drugs  Current medications and supplements including opioid prescriptions. Patient is not currently taking opioid prescriptions. Functional ability and status Nutritional status Physical activity Advanced directives List of other physicians Hospitalizations, surgeries, and ER visits in previous 12 months Vitals Screenings to include cognitive, depression, and falls Referrals  and appointments  In addition, I have reviewed and discussed with patient certain preventive protocols, quality metrics, and best practice recommendations. A written personalized care plan for preventive services as well as general preventive health recommendations were provided to patient.    Sue Lush, LPN   06/02/1094   Nurse Notes: The patient states she is doing well and has no concerns or questions at this time.

## 2022-10-31 NOTE — Patient Instructions (Addendum)
Jessica Little , Thank you for taking time to come for your Medicare Wellness Visit. I appreciate your ongoing commitment to your health goals. Please review the following plan we discussed and let me know if I can assist you in the future.   These are the goals we discussed:  Goals      DIET - EAT MORE FRUITS AND VEGETABLES     DIET - INCREASE WATER INTAKE     Recommend increasing water intake to 6-8 8 oz glasses a day.         This is a list of the screening recommended for you and due dates:  Health Maintenance  Topic Date Due   Zoster (Shingles) Vaccine (1 of 2) Never done   DTaP/Tdap/Td vaccine (2 - Tdap) 10/25/2010   DEXA scan (bone density measurement)  01/02/2022   COVID-19 Vaccine (3 - 2023-24 season) 01/26/2022   Medicare Annual Wellness Visit  10/31/2023   Colon Cancer Screening  08/22/2025   Pneumonia Vaccine  Completed   Hepatitis C Screening  Completed   HPV Vaccine  Aged Out    Advanced directives: yes  Conditions/risks identified: none  Next appointment: Follow up in one year for your annual wellness visit 11/05/23 @ 1:30pm telephone   Preventive Care 65 Years and Older, Female Preventive care refers to lifestyle choices and visits with your health care provider that can promote health and wellness. What does preventive care include? A yearly physical exam. This is also called an annual well check. Dental exams once or twice a year. Routine eye exams. Ask your health care provider how often you should have your eyes checked. Personal lifestyle choices, including: Daily care of your teeth and gums. Regular physical activity. Eating a healthy diet. Avoiding tobacco and drug use. Limiting alcohol use. Practicing safe sex. Taking low-dose aspirin every day. Taking vitamin and mineral supplements as recommended by your health care provider. What happens during an annual well check? The services and screenings done by your health care provider during your  annual well check will depend on your age, overall health, lifestyle risk factors, and family history of disease. Counseling  Your health care provider may ask you questions about your: Alcohol use. Tobacco use. Drug use. Emotional well-being. Home and relationship well-being. Sexual activity. Eating habits. History of falls. Memory and ability to understand (cognition). Work and work Astronomer. Reproductive health. Screening  You may have the following tests or measurements: Height, weight, and BMI. Blood pressure. Lipid and cholesterol levels. These may be checked every 5 years, or more frequently if you are over 74 years old. Skin check. Lung cancer screening. You may have this screening every year starting at age 61 if you have a 30-pack-year history of smoking and currently smoke or have quit within the past 15 years. Fecal occult blood test (FOBT) of the stool. You may have this test every year starting at age 90. Flexible sigmoidoscopy or colonoscopy. You may have a sigmoidoscopy every 5 years or a colonoscopy every 10 years starting at age 67. Hepatitis C blood test. Hepatitis B blood test. Sexually transmitted disease (STD) testing. Diabetes screening. This is done by checking your blood sugar (glucose) after you have not eaten for a while (fasting). You may have this done every 1-3 years. Bone density scan. This is done to screen for osteoporosis. You may have this done starting at age 71. Mammogram. This may be done every 1-2 years. Talk to your health care provider about how often  you should have regular mammograms. Talk with your health care provider about your test results, treatment options, and if necessary, the need for more tests. Vaccines  Your health care provider may recommend certain vaccines, such as: Influenza vaccine. This is recommended every year. Tetanus, diphtheria, and acellular pertussis (Tdap, Td) vaccine. You may need a Td booster every 10  years. Zoster vaccine. You may need this after age 5. Pneumococcal 13-valent conjugate (PCV13) vaccine. One dose is recommended after age 64. Pneumococcal polysaccharide (PPSV23) vaccine. One dose is recommended after age 33. Talk to your health care provider about which screenings and vaccines you need and how often you need them. This information is not intended to replace advice given to you by your health care provider. Make sure you discuss any questions you have with your health care provider. Document Released: 06/10/2015 Document Revised: 02/01/2016 Document Reviewed: 03/15/2015 Elsevier Interactive Patient Education  2017 [employer].  Fall Prevention in the Home Falls can cause injuries. They can happen to people of all ages. There are many things you can do to make your home safe and to help prevent falls. What can I do on the outside of my home? Regularly fix the edges of walkways and driveways and fix any cracks. Remove anything that might make you trip as you walk through a door, such as a raised step or threshold. Trim any bushes or trees on the path to your home. Use bright outdoor lighting. Clear any walking paths of anything that might make someone trip, such as rocks or tools. Regularly check to see if handrails are loose or broken. Make sure that both sides of any steps have handrails. Any raised decks and porches should have guardrails on the edges. Have any leaves, snow, or ice cleared regularly. Use sand or salt on walking paths during winter. Clean up any spills in your garage right away. This includes oil or grease spills. What can I do in the bathroom? Use night lights. Install grab bars by the toilet and in the tub and shower. Do not use towel bars as grab bars. Use non-skid mats or decals in the tub or shower. If you need to sit down in the shower, use a plastic, non-slip stool. Keep the floor dry. Clean up any water that spills on the floor as soon as it  happens. Remove soap buildup in the tub or shower regularly. Attach bath mats securely with double-sided non-slip rug tape. Do not have throw rugs and other things on the floor that can make you trip. What can I do in the bedroom? Use night lights. Make sure that you have a light by your bed that is easy to reach. Do not use any sheets or blankets that are too big for your bed. They should not hang down onto the floor. Have a firm chair that has side arms. You can use this for support while you get dressed. Do not have throw rugs and other things on the floor that can make you trip. What can I do in the kitchen? Clean up any spills right away. Avoid walking on wet floors. Keep items that you use a lot in easy-to-reach places. If you need to reach something above you, use a strong step stool that has a grab bar. Keep electrical cords out of the way. Do not use floor polish or wax that makes floors slippery. If you must use wax, use non-skid floor wax. Do not have throw rugs and other things on  the floor that can make you trip. What can I do with my stairs? Do not leave any items on the stairs. Make sure that there are handrails on both sides of the stairs and use them. Fix handrails that are broken or loose. Make sure that handrails are as long as the stairways. Check any carpeting to make sure that it is firmly attached to the stairs. Fix any carpet that is loose or worn. Avoid having throw rugs at the top or bottom of the stairs. If you do have throw rugs, attach them to the floor with carpet tape. Make sure that you have a light switch at the top of the stairs and the bottom of the stairs. If you do not have them, ask someone to add them for you. What else can I do to help prevent falls? Wear shoes that: Do not have high heels. Have rubber bottoms. Are comfortable and fit you well. Are closed at the toe. Do not wear sandals. If you use a stepladder: Make sure that it is fully opened.  Do not climb a closed stepladder. Make sure that both sides of the stepladder are locked into place. Ask someone to hold it for you, if possible. Clearly mark and make sure that you can see: Any grab bars or handrails. First and last steps. Where the edge of each step is. Use tools that help you move around (mobility aids) if they are needed. These include: Canes. Walkers. Scooters. Crutches. Turn on the lights when you go into a dark area. Replace any light bulbs as soon as they burn out. Set up your furniture so you have a clear path. Avoid moving your furniture around. If any of your floors are uneven, fix them. If there are any pets around you, be aware of where they are. Review your medicines with your doctor. Some medicines can make you feel dizzy. This can increase your chance of falling. Ask your doctor what other things that you can do to help prevent falls. This information is not intended to replace advice given to you by your health care provider. Make sure you discuss any questions you have with your health care provider. Document Released: 03/10/2009 Document Revised: 10/20/2015 Document Reviewed: 06/18/2014 Elsevier Interactive Patient Education  2017 [employer].

## 2022-11-05 ENCOUNTER — Encounter: Payer: Self-pay | Admitting: Urology

## 2022-11-05 ENCOUNTER — Ambulatory Visit (INDEPENDENT_AMBULATORY_CARE_PROVIDER_SITE_OTHER): Payer: Medicare Other | Admitting: Urology

## 2022-11-05 VITALS — BP 116/67 | HR 67 | Ht 66.0 in | Wt 169.0 lb

## 2022-11-05 DIAGNOSIS — R3129 Other microscopic hematuria: Secondary | ICD-10-CM | POA: Diagnosis not present

## 2022-11-05 LAB — URINALYSIS, COMPLETE
Bilirubin, UA: NEGATIVE
Glucose, UA: NEGATIVE
Ketones, UA: NEGATIVE
Nitrite, UA: NEGATIVE
Protein,UA: NEGATIVE
Specific Gravity, UA: <1.005 — ABNORMAL LOW (ref 1.005–1.030)
Urobilinogen, Ur: 0.2 mg/dL (ref 0.2–1.0)
pH, UA: 6 (ref 5.0–7.5)

## 2022-11-05 LAB — MICROSCOPIC EXAMINATION

## 2022-11-05 NOTE — Progress Notes (Signed)
I, Maysun L Gibbs,acting as a scribe for Riki Altes, MD.,have documented all relevant documentation on the behalf of Riki Altes, MD,as directed by  Riki Altes, MD while in the presence of Riki Altes, MD.  11/05/2022 11:08 AM   Jessica Little [birth year] [id number]  Referring provider: Jacky Kindle, FNP [address] [place],  [place] [address]  Chief Complaint  Patient presents with   Hematuria   Urologic history: 1.  High risk hematuria CTU 04/2021 small renal lesions consistent with cysts Cystoscopy 04/2021 without abnormality  HPI: Jessica Little is a 77 y.o. female presents for annual follow-up of hematuria.   Doing well since last visit No bothersome LUTS Denies dysuria, gross hematuria Denies flank, abdominal or pelvic pain   PMH: Past Medical History:  Diagnosis Date   Anxiety    GERD (gastroesophageal reflux disease)    Hyperlipidemia    Hypertension     Surgical History: Past Surgical History:  Procedure Laterality Date   CATARACT EXTRACTION W/PHACO Left 01/18/2021   Procedure: CATARACT EXTRACTION PHACO AND INTRAOCULAR LENS PLACEMENT (IOC) LEFT 7.40 01:11.7;  Surgeon: Lockie Mola, MD;  Location: [facility];  Service: Ophthalmology;  Laterality: Left;   CATARACT EXTRACTION W/PHACO Right 02/01/2021   Procedure: CATARACT EXTRACTION PHACO AND INTRAOCULAR LENS PLACEMENT (IOC) RIGHT EYHANCE TORIC 8.60 01:09.9 ;  Surgeon: Lockie Mola, MD;  Location: [facility];  Service: Ophthalmology;  Laterality: Right;   CHOLECYSTECTOMY     COLONOSCOPY  01/01/2006   COLONOSCOPY WITH PROPOFOL N/A 08/23/2015   Procedure: COLONOSCOPY WITH PROPOFOL;  Surgeon: Earline Mayotte, MD;  [facility];  Service: Endoscopy;  Laterality: N/A;   heart monitor Right 10/22/2021   HIATAL HERNIA REPAIR  2019    Home Medications:  Allergies as of 11/05/2022   No Known Allergies      Medication List         Accurate as of November 05, 2022 11:08 AM. If you have any questions, ask your nurse or doctor.          amLODipine 5 MG tablet Commonly known as: NORVASC Take 1 tablet (5 mg total) by mouth daily.   aspirin EC 81 MG tablet Take 81 mg by mouth daily. Swallow whole.   atenolol 50 MG tablet Commonly known as: TENORMIN Take 1 tablet (50 mg total) by mouth daily.   atorvastatin 40 MG tablet Commonly known as: LIPITOR Take 1 tablet (40 mg total) by mouth daily.   B-complex with vitamin C tablet Take 1 tablet by mouth daily.   Biotin 65784 MCG Tabs Take by mouth.   Calcium-Magnesium-Zinc 500-250-12.5 MG Tabs Take 1 tablet by mouth daily. Dose unknown   erythromycin ophthalmic ointment Place 1 Application into the left eye at bedtime. 1 cm thin ribbon   Flaxseed Oil 1000 MG Caps Take by mouth.   lisinopril 20 MG tablet Commonly known as: ZESTRIL Take 2 tablets by mouth once daily   nortriptyline 50 MG capsule Commonly known as: PAMELOR Take 1 capsule (50 mg total) by mouth at bedtime.   omeprazole 40 MG capsule Commonly known as: PRILOSEC Take 1 capsule (40 mg total) by mouth 2 (two) times daily.   perphenazine 4 MG tablet Commonly known as: TRILAFON TAKE 1 TABLET BY MOUTH AT BEDTIME   potassium chloride 10 MEQ tablet Commonly known as: KLOR-CON M Take 1 tablet by mouth once daily   VITAMIN D3 PO Take by mouth daily.  vitamin E 1000 UNIT capsule Take 1,000 Units by mouth daily.        Allergies: No Known Allergies  Family History: Family History  Problem Relation Age of Onset   Arrhythmia Mother    Heart attack Father    Stroke Father    Depression Sister    Hypertension Sister    Healthy Brother    Breast cancer Neg Hx     Social History:  reports that she has never smoked. She has never used smokeless tobacco. She reports that she does not drink alcohol and does not use drugs.   Physical Exam: BP 116/67   Pulse 67   Ht 5\' 6"  (1.676 m)    Wt 169 lb (76.7 kg)   BMI 27.28 kg/m   Constitutional:  Alert and oriented, No acute distress. HEENT:  AT Respiratory: Normal respiratory effort, no increased work of breathing. Psychiatric: Normal mood and affect.   Urinalysis Dipstick trace blood/1+ leukocytes, microscopy 6-10 WBC/3-10 RBC.   Assessment & Plan:    1. Microhematuria UA today with mild pyuria/microhematuria Urine culture ordered, though if positive, she is asymptomatic and will not treat.  1 year follow-up with UA If UA negative on follow-up, we'll see as needed.  I have reviewed the above documentation for accuracy and completeness, and I agree with the above.   Riki Altes, MD  [facility] [address] [place], [place] [address] [phone]

## 2022-11-08 LAB — CULTURE, URINE COMPREHENSIVE

## 2022-11-18 ENCOUNTER — Other Ambulatory Visit: Payer: Self-pay | Admitting: Psychiatry

## 2022-11-18 DIAGNOSIS — F251 Schizoaffective disorder, depressive type: Secondary | ICD-10-CM

## 2022-11-19 ENCOUNTER — Telehealth: Payer: Self-pay | Admitting: Psychiatry

## 2022-11-19 ENCOUNTER — Telehealth: Payer: Self-pay

## 2022-11-19 NOTE — Telephone Encounter (Signed)
Jessica Little with [facility] Medicare called re: Jessica Little. She gave approval for prior authorization on Perphenazine 4 mg tablets. [employer] approved coverage for a full year. No number was given on message.

## 2022-11-19 NOTE — Telephone Encounter (Signed)
[employer]  has approved PERPHENAZINE 4mg  tabl from 11/19/22-11/19/2023

## 2022-11-19 NOTE — Telephone Encounter (Signed)
Prior Authorization submitted and approved for PERPHENAZINE 4 MG with [employer] effective through 11/19/2023

## 2023-01-21 ENCOUNTER — Other Ambulatory Visit: Payer: Self-pay | Admitting: Family Medicine

## 2023-01-21 DIAGNOSIS — E876 Hypokalemia: Secondary | ICD-10-CM

## 2023-01-28 ENCOUNTER — Other Ambulatory Visit: Payer: Self-pay | Admitting: Family Medicine

## 2023-01-28 DIAGNOSIS — I10 Essential (primary) hypertension: Secondary | ICD-10-CM

## 2023-03-03 NOTE — Telephone Encounter (Signed)
Approved through 11/19/2023

## 2023-03-11 ENCOUNTER — Ambulatory Visit: Payer: Self-pay | Admitting: *Deleted

## 2023-03-11 ENCOUNTER — Ambulatory Visit: Payer: Medicare Other | Admitting: Family Medicine

## 2023-03-11 ENCOUNTER — Encounter: Payer: Self-pay | Admitting: Family Medicine

## 2023-03-11 VITALS — BP 147/84 | HR 73 | Temp 99.2°F | Resp 20 | Ht 66.0 in | Wt 162.2 lb

## 2023-03-11 DIAGNOSIS — I10 Essential (primary) hypertension: Secondary | ICD-10-CM

## 2023-03-11 DIAGNOSIS — B9689 Other specified bacterial agents as the cause of diseases classified elsewhere: Secondary | ICD-10-CM | POA: Insufficient documentation

## 2023-03-11 DIAGNOSIS — J0140 Acute pansinusitis, unspecified: Secondary | ICD-10-CM | POA: Diagnosis not present

## 2023-03-11 MED ORDER — ACETAMINOPHEN 500 MG PO TABS
500.0000 mg | ORAL_TABLET | Freq: Four times a day (QID) | ORAL | Status: AC | PRN
Start: 2023-03-11 — End: ?

## 2023-03-11 MED ORDER — AMOXICILLIN-POT CLAVULANATE 875-125 MG PO TABS
1.0000 | ORAL_TABLET | Freq: Two times a day (BID) | ORAL | 0 refills | Status: DC
Start: 2023-03-11 — End: 2023-12-09

## 2023-03-11 NOTE — Progress Notes (Signed)
Established patient visit   Patient: Jessica Little   DOB: 1945/06/07   77 y.o. Female  MRN: [id number] Visit Date: 03/11/2023  Today's healthcare provider: Jacky Kindle, FNP  Introduced to nurse practitioner role and practice setting.  All questions answered.  Discussed provider/patient relationship and expectations.  Subjective    HPI HPI     Sore Throat    Additional comments: Sore throat started Friday night, congested (been taking cold and flu) hard to swallow and havent eaten much the last couple days and some coughing  Left eye is red       Last edited by Clois Comber on 03/11/2023  9:48 AM.      Medications: Outpatient Medications Prior to Visit  Medication Sig   amLODipine (NORVASC) 5 MG tablet Take 1 tablet (5 mg total) by mouth daily.   aspirin EC 81 MG tablet Take 81 mg by mouth daily. Swallow whole.   atenolol (TENORMIN) 50 MG tablet Take 1 tablet (50 mg total) by mouth daily.   atorvastatin (LIPITOR) 40 MG tablet Take 1 tablet (40 mg total) by mouth daily.   B Complex-C (B-COMPLEX WITH VITAMIN C) tablet Take 1 tablet by mouth daily.   Biotin 08657 MCG TABS Take by mouth.   Calcium-Magnesium-Zinc 500-250-12.5 MG TABS Take 1 tablet by mouth daily. Dose unknown   Cholecalciferol (VITAMIN D3 PO) Take by mouth daily.   erythromycin ophthalmic ointment Place 1 Application into the left eye at bedtime. 1 cm thin ribbon   Flaxseed, Linseed, (FLAXSEED OIL) 1000 MG CAPS Take by mouth.   lisinopril (ZESTRIL) 20 MG tablet Take 2 tablets by mouth once daily   nortriptyline (PAMELOR) 50 MG capsule Take 1 capsule (50 mg total) by mouth at bedtime.   omeprazole (PRILOSEC) 40 MG capsule Take 1 capsule (40 mg total) by mouth 2 (two) times daily.   perphenazine (TRILAFON) 4 MG tablet TAKE 1 TABLET BY MOUTH AT BEDTIME   potassium chloride (KLOR-CON M) 10 MEQ tablet Take 1 tablet by mouth once daily   vitamin E 1000 UNIT capsule Take 1,000 Units by mouth daily.   No  facility-administered medications prior to visit.     Objective    BP (!) 147/84   Pulse 73   Temp 99.2 F (37.3 C)   Resp 20   Ht 5\' 6"  (1.676 m)   Wt 162 lb 3.2 oz (73.6 kg)   SpO2 98%   BMI 26.18 kg/m   Physical Exam Vitals and nursing note reviewed.  Constitutional:      General: She is not in acute distress.    Appearance: Normal appearance. She is overweight. She is not ill-appearing, toxic-appearing or diaphoretic.  HENT:     Head: Normocephalic and atraumatic.     Right Ear: Tympanic membrane, ear canal and external ear normal.     Left Ear: Tympanic membrane, ear canal and external ear normal.     Nose: Congestion present.     Right Sinus: Maxillary sinus tenderness and frontal sinus tenderness present.     Left Sinus: Maxillary sinus tenderness and frontal sinus tenderness present.     Mouth/Throat:     Mouth: Mucous membranes are dry.     Dentition: Abnormal dentition.     Pharynx: Oropharynx is clear. Posterior oropharyngeal erythema present.     Tonsils: No tonsillar exudate or tonsillar abscesses.  Eyes:     Extraocular Movements: Extraocular movements intact.     Conjunctiva/sclera:  Left eye: Left conjunctiva is injected.  Cardiovascular:     Rate and Rhythm: Normal rate and regular rhythm.     Pulses: Normal pulses.     Heart sounds: Normal heart sounds. No murmur heard.    No friction rub. No gallop.  Pulmonary:     Effort: Pulmonary effort is normal. No respiratory distress.     Breath sounds: Normal breath sounds. No stridor. No wheezing, rhonchi or rales.  Chest:     Chest wall: No tenderness.  Musculoskeletal:        General: No swelling, tenderness, deformity or signs of injury. Normal range of motion.     Cervical back: Full passive range of motion without pain.     Right lower leg: No edema.     Left lower leg: No edema.  Skin:    General: Skin is warm and dry.     Capillary Refill: Capillary refill takes less than 2 seconds.      Coloration: Skin is not jaundiced or pale.     Findings: No bruising, erythema, lesion or rash.  Neurological:     General: No focal deficit present.     Mental Status: She is alert and oriented to person, place, and time. Mental status is at baseline.     Cranial Nerves: No cranial nerve deficit.     Sensory: No sensory deficit.     Motor: No weakness.     Coordination: Coordination normal.  Psychiatric:        Mood and Affect: Mood normal.        Behavior: Behavior normal.        Thought Content: Thought content normal.        Judgment: Judgment normal.     No results found for any visits on 03/11/23.  Assessment & Plan     Problem List Items Addressed This Visit       Cardiovascular and Mediastinum   Primary hypertension    Acute elevation; may be tied to illness or OTC PRN cough/cold medications Continue to monitor Known syncope; defer medication adjustments at this time  Continue 50 atenolol, 5 norvasc        Respiratory   Acute non-recurrent pansinusitis - Primary    5 days of symptoms; slight fullness on sinuses low grade fever with ongoing symptoms failing OTC treatment for cough, congestion etc Pt also applying OTC eye cream to assist with irritation; denies s/s of infection Recommend ABX and tylenol to assist with throat pain and irritation in throat      Relevant Medications   amoxicillin-clavulanate (AUGMENTIN) 875-125 MG tablet   acetaminophen (TYLENOL) 500 MG tablet    Return if symptoms worsen or fail to improve.     Leilani Merl, FNP, have reviewed all documentation for this visit. The documentation on 03/11/23 for the exam, diagnosis, procedures, and orders are all accurate and complete.  Jacky Kindle, FNP  [facility] [phone] (phone) [phone] (fax)  [facility]

## 2023-03-11 NOTE — Assessment & Plan Note (Signed)
Acute elevation; may be tied to illness or OTC PRN cough/cold medications Continue to monitor Known syncope; defer medication adjustments at this time  Continue 50 atenolol, 5 norvasc

## 2023-03-11 NOTE — Assessment & Plan Note (Deleted)
5 days of symptoms; slight fullness on sinuses low grade fever with ongoing symptoms failing OTC treatment for cough, congestion etc Pt also applying OTC eye cream to assist with irritation; denies s/s of infection Recommend ABX and steroids to assist with throat pain and irritation in bronchial

## 2023-03-11 NOTE — Assessment & Plan Note (Signed)
5 days of symptoms; slight fullness on sinuses low grade fever with ongoing symptoms failing OTC treatment for cough, congestion etc Pt also applying OTC eye cream to assist with irritation; denies s/s of infection Recommend ABX and tylenol to assist with throat pain and irritation in throat

## 2023-03-11 NOTE — Telephone Encounter (Signed)
  Chief Complaint: sore throat  cold sx  Symptoms: see above . Can swallow but severe pain with swallowing  Frequency: did not report Pertinent Negatives: Patient denies drooling or inability to swallow  Disposition: [] ED /[] Urgent Care (no appt availability in office) / [x] Appointment(In office/virtual)/ []  [place] [facility]/ [] Home Care/ [] Refused Recommended Disposition /[] [facility]/ []  Follow-up with PCP Additional Notes:   Appt already scheduled for this am.     Reason for Disposition  SEVERE (e.g., excruciating) throat pain  Answer Assessment - Initial Assessment Questions 1. ONSET: "When did the throat start hurting?" (Hours or days ago)      Reports has had a cold did not report how long 2. SEVERITY: "How bad is the sore throat?" (Scale 1-10; mild, moderate or severe)   - MILD (1-3):  Doesn't interfere with eating or normal activities.   - MODERATE (4-7): Interferes with eating some solids and normal activities.   - SEVERE (8-10):  Excruciating pain, interferes with most normal activities.   - SEVERE WITH DYSPHAGIA (10): Can't swallow liquids, drooling.     Severe pain with swallowing liquids but can swallow  3. STREP EXPOSURE: "Has there been any exposure to strep within the past week?" If Yes, ask: "What type of contact occurred?"      na 4.  VIRAL SYMPTOMS: "Are there any symptoms of a cold, such as a runny nose, cough, hoarse voice or red eyes?"      Cold sx hoarse voice  5. FEVER: "Do you have a fever?" If Yes, ask: "What is your temperature, how was it measured, and when did it start?"     Na  6. PUS ON THE TONSILS: "Is there pus on the tonsils in the back of your throat?"     unsure 7. OTHER SYMPTOMS: "Do you have any other symptoms?" (e.g., difficulty breathing, headache, rash)     Cold sx sore throat hoarse voice  8. PREGNANCY: "Is there any chance you are pregnant?" "When was your last menstrual period?"     na  Protocols used: Sore  Throat-A-AH

## 2023-03-11 NOTE — Patient Instructions (Signed)
Some things that can make you feel better are: - Increased rest - Increasing Fluids - Acetaminophen / ibuprofen as needed for fever/pain.  - Salt water gargling, chloraseptic spray and throat lozenges - OTC pseudoephedrine.  - Mucinex.  - Saline sinus flushes or a neti pot.  - Humidifying the air.

## 2023-03-21 ENCOUNTER — Other Ambulatory Visit: Payer: Self-pay | Admitting: Family Medicine

## 2023-03-21 DIAGNOSIS — K2100 Gastro-esophageal reflux disease with esophagitis, without bleeding: Secondary | ICD-10-CM

## 2023-03-22 NOTE — Telephone Encounter (Signed)
Requested Prescriptions  Pending Prescriptions Disp Refills   omeprazole (PRILOSEC) 40 MG capsule [Pharmacy Med Name: OMEPRAZOLE DR 40MG   CAP] 180 capsule 1    Sig: Take 1 capsule by mouth twice daily     Gastroenterology: Proton Pump Inhibitors Passed - 03/21/2023  3:35 PM      Passed - Valid encounter within last 12 months    Recent Outpatient Visits           1 week ago Acute non-recurrent pansinusitis   [facility] Jacky Kindle, FNP   7 months ago Primary hypertension   [place] [facility] Merita Norton T, FNP   10 months ago Preseptal cellulitis of right lower eyelid   [place] [facility]-Robinson, Tawanna Cooler, MD   1 year ago Stage 3a chronic kidney disease [facility])   [place] [facility] Jacky Kindle, FNP   1 year ago Chalazion of left upper eyelid   [place] [facility] Jacky Kindle, FNP       Future Appointments             In 7 months Stoioff, Verna Czech, MD [facility]

## 2023-04-03 DIAGNOSIS — I129 Hypertensive chronic kidney disease with stage 1 through stage 4 chronic kidney disease, or unspecified chronic kidney disease: Secondary | ICD-10-CM | POA: Diagnosis not present

## 2023-04-03 DIAGNOSIS — R809 Proteinuria, unspecified: Secondary | ICD-10-CM | POA: Diagnosis not present

## 2023-04-03 DIAGNOSIS — N1831 Chronic kidney disease, stage 3a: Secondary | ICD-10-CM | POA: Diagnosis not present

## 2023-04-03 DIAGNOSIS — E876 Hypokalemia: Secondary | ICD-10-CM | POA: Diagnosis not present

## 2023-04-09 DIAGNOSIS — I129 Hypertensive chronic kidney disease with stage 1 through stage 4 chronic kidney disease, or unspecified chronic kidney disease: Secondary | ICD-10-CM | POA: Diagnosis not present

## 2023-04-09 DIAGNOSIS — K219 Gastro-esophageal reflux disease without esophagitis: Secondary | ICD-10-CM | POA: Diagnosis not present

## 2023-04-09 DIAGNOSIS — N1832 Chronic kidney disease, stage 3b: Secondary | ICD-10-CM | POA: Diagnosis not present

## 2023-04-09 DIAGNOSIS — E876 Hypokalemia: Secondary | ICD-10-CM | POA: Diagnosis not present

## 2023-04-17 ENCOUNTER — Other Ambulatory Visit: Payer: Self-pay | Admitting: Family Medicine

## 2023-04-17 DIAGNOSIS — E876 Hypokalemia: Secondary | ICD-10-CM

## 2023-05-02 ENCOUNTER — Other Ambulatory Visit: Payer: Self-pay | Admitting: Family Medicine

## 2023-05-02 DIAGNOSIS — I10 Essential (primary) hypertension: Secondary | ICD-10-CM

## 2023-06-11 ENCOUNTER — Telehealth: Payer: Self-pay | Admitting: Psychiatry

## 2023-06-11 DIAGNOSIS — F251 Schizoaffective disorder, depressive type: Secondary | ICD-10-CM

## 2023-06-11 MED ORDER — PERPHENAZINE 4 MG PO TABS: ORAL_TABLET | ORAL | 1 refills | Status: DC

## 2023-06-11 NOTE — Telephone Encounter (Signed)
 Patient called stating her prescription for Perphenazine  4mg  is now four tier and is to expensive. She would like to switch prescription to a different pharmacy as it will be a cheaper price. Ph: [phone] Pharmacy [facility] [address] [place]

## 2023-06-11 NOTE — Telephone Encounter (Signed)
 Rx sent as requested to [facility].

## 2023-07-01 ENCOUNTER — Telehealth: Payer: Self-pay | Admitting: Family Medicine

## 2023-07-01 ENCOUNTER — Other Ambulatory Visit: Payer: Self-pay

## 2023-07-01 DIAGNOSIS — I10 Essential (primary) hypertension: Secondary | ICD-10-CM

## 2023-07-01 NOTE — Telephone Encounter (Signed)
Medication Refill -  Most Recent Primary Care Visit:  Provider: Merita Norton T  Department: [facility]  Visit Type: OFFICE VISIT  Date: 03/11/2023  Medication: atenolol (TENORMIN) 50 MG tablet   Has the patient contacted their pharmacy? Yes Pharmacy advised pt that they sent in a request to PCP and they have not heard anything.   Is this the correct pharmacy for this prescription? Yes  [facility] [address] ([address]), [place] - [address] Phone: [phone]  Fax: [phone]      Has the prescription been filled recently? No  Is the patient out of the medication? Yes  Has the patient been seen for an appointment in the last year OR does the patient have an upcoming appointment? Yes  Can we respond through MyChart? No  Agent: Please be advised that Rx refills may take up to 3 business days. We ask that you follow-up with your pharmacy.

## 2023-07-02 MED ORDER — ATENOLOL 50 MG PO TABS: 50.0000 mg | ORAL_TABLET | Freq: Every day | ORAL | 0 refills | Status: DC

## 2023-07-22 ENCOUNTER — Other Ambulatory Visit: Payer: Self-pay | Admitting: Physician Assistant

## 2023-07-22 DIAGNOSIS — E876 Hypokalemia: Secondary | ICD-10-CM

## 2023-07-22 NOTE — Telephone Encounter (Signed)
 Copied from CRM [id number]. Topic: Clinical - Medication Refill >> Jul 22, 2023  2:56 PM Patsy Lager T wrote: Most Recent Primary Care Visit:  Provider: Merita Norton T  Department: [facility]  Visit Type: OFFICE VISIT  Date: 03/11/2023  Medication: potassium chloride (KLOR-CON M) 10 MEQ tablet  Has the patient contacted their pharmacy? Yes Pharmacy stated she is out of refills  Is this the correct pharmacy for this prescription? Yes If no, delete pharmacy and type the correct one.  This is the patient's preferred pharmacy:  [facility] [address] ([address]), [place] - [address] [place]) [place] [address] Phone: [phone] Fax: [phone]  Has the prescription been filled recently? Yes  Is the patient out of the medication? Yes  Has the patient been seen for an appointment in the last year OR does the patient have an upcoming appointment? Yes  Can we respond through MyChart? No  Agent: Please be advised that Rx refills may take up to 3 business days. We ask that you follow-up with your pharmacy.  Patient will take her last pill today

## 2023-07-23 MED ORDER — POTASSIUM CHLORIDE CRYS ER 10 MEQ PO TBCR: 10.0000 meq | EXTENDED_RELEASE_TABLET | Freq: Every day | ORAL | 0 refills | Status: DC

## 2023-07-23 NOTE — Telephone Encounter (Signed)
 Requested Prescriptions  Pending Prescriptions Disp Refills   potassium chloride (KLOR-CON M) 10 MEQ tablet 90 tablet 0    Sig: Take 1 tablet (10 mEq total) by mouth daily.     Endocrinology:  Minerals - Potassium Supplementation Failed - 07/23/2023  3:44 PM      Failed - Cr in normal range and within 360 days    Creatinine  Date Value Ref Range Status  04/30/2014 1.75 (H) 0.60 - 1.30 mg/dL Final   Creatinine, Ser  Date Value Ref Range Status  07/30/2022 1.17 (H) 0.57 - 1.00 mg/dL Final         Passed - K in normal range and within 360 days    Potassium  Date Value Ref Range Status  07/30/2022 3.7 3.5 - 5.2 mmol/L Final  04/30/2014 3.3 (L) 3.5 - 5.1 mmol/L Final         Passed - Valid encounter within last 12 months    Recent Outpatient Visits           4 months ago Acute non-recurrent pansinusitis   [facility] Jacky Kindle, FNP   11 months ago Primary hypertension   [place] [facility] Merita Norton T, FNP   1 year ago Preseptal cellulitis of right lower eyelid   [place] [facility] Simmons-Rob[facility], Tawanna Cooler, MD   1 year ago Stage 3a chronic kidney disease [facility])   [place] [facility] Merita Norton T, FNP   1 year ago Chalazion of left upper eyelid   [place] [facility] Jacky Kindle, FNP       Future Appointments             In 3 months Stoioff, Verna Czech, MD [facility] [place]             Patient will need an office visit for additional refills.

## 2023-08-08 ENCOUNTER — Ambulatory Visit (INDEPENDENT_AMBULATORY_CARE_PROVIDER_SITE_OTHER): Admitting: Family Medicine

## 2023-08-08 ENCOUNTER — Encounter: Payer: Self-pay | Admitting: Family Medicine

## 2023-08-08 VITALS — BP 143/87 | HR 79 | Ht 66.0 in | Wt 163.5 lb

## 2023-08-08 DIAGNOSIS — E782 Mixed hyperlipidemia: Secondary | ICD-10-CM

## 2023-08-08 DIAGNOSIS — F251 Schizoaffective disorder, depressive type: Secondary | ICD-10-CM | POA: Diagnosis not present

## 2023-08-08 DIAGNOSIS — I10 Essential (primary) hypertension: Secondary | ICD-10-CM | POA: Diagnosis not present

## 2023-08-08 MED ORDER — ATORVASTATIN CALCIUM 40 MG PO TABS: 40.0000 mg | ORAL_TABLET | Freq: Every day | ORAL | 3 refills | Status: AC

## 2023-08-08 MED ORDER — LISINOPRIL 20 MG PO TABS: 40.0000 mg | ORAL_TABLET | Freq: Every day | ORAL | 1 refills | Status: DC

## 2023-08-08 MED ORDER — ATENOLOL 50 MG PO TABS
50.0000 mg | ORAL_TABLET | Freq: Every day | ORAL | 1 refills | Status: DC
Start: 2023-08-08 — End: 2024-04-01

## 2023-08-08 NOTE — Assessment & Plan Note (Signed)
 Chronic Continue Atorvastatin 40mg  daily Plan to recheck labs at next visit Continue to make conscious decisions for well balanced diet smaller portions with increase protein, fruits, veggies, water as drink of choice, decrease starches, processed foods, and saturated fats. Increase weekly exercise - 150 minutes per week.

## 2023-08-08 NOTE — Assessment & Plan Note (Signed)
 Chronic hypertension managed with lisinopril and atenolol.  Lightheadedness with amlodipine led to its discontinuation.  Slightly elevated blood pressure likely due to missed lisinopril dose today per pt as she had ran out.  - Continue atenolol. - Continue Lisinopril 40 mg daily

## 2023-08-08 NOTE — Assessment & Plan Note (Signed)
 Chronic Stable Followed by Psych - Dr. Jennelle Human Continue Nortriptyline and perphenazine daily by Dr. Jennelle Human

## 2023-08-08 NOTE — Progress Notes (Signed)
 Established Patient Office Visit  Introduced to nurse practitioner role and practice setting.  All questions answered.  Discussed provider/patient relationship and expectations.   Subjective   Patient ID: Jessica Little, female    DOB: 05/14/1946  Age: 78 y.o. MRN: [id number]  Chief Complaint  Patient presents with   Medication Refill   The patient is a 78 year old with hypertension and chronic kidney disease who presents for chronic disease management follow-up.  She seeks a medication refill for lisinopril, which she takes at a dose of 40 mg daily. She has not taken her lisinopril today because she ran out of the medication.  Her current medication regimen includes atenolol for blood pressure management, and she has discontinued amlodipine due to experiencing lightheadedness. She also takes a daily aspirin and atorvastatin 40 mg for cholesterol management. Additionally, she takes potassium supplements due to a history of low potassium levels.  She has a history of fainting a few years ago, which led to a comprehensive workup including an echocardiogram and EEG, but no serious issues were found. She has no history of diabetes or cardiac events such as heart attacks or strokes.  Her current medication regimen also includes nortriptyline and perphenazine, prescribed by Dr. Jennelle Human, for a 'chemical imbalance in my head'.  No major concerns today and no history of bleeding disorders. No history of diabetes or cardiac events.  Medication Refill        08/08/2023    1:13 PM 03/11/2023    9:48 AM 10/31/2022    1:28 PM  Depression screen PHQ 2/9  Decreased Interest 0 0 0  Down, Depressed, Hopeless 0 0 0  PHQ - 2 Score 0 0 0  Altered sleeping 0    Tired, decreased energy 0    Change in appetite 0    Feeling bad or failure about yourself  0    Trouble concentrating 0    Moving slowly or fidgety/restless 0    Suicidal thoughts 0    PHQ-9 Score 0         08/08/2023    1:13 PM  GAD  7 : Generalized Anxiety Score  Nervous, Anxious, on Edge 0  Control/stop worrying 0  Worry too much - different things 0  Trouble relaxing 0  Restless 0  Easily annoyed or irritable 0  Afraid - awful might happen 0  Total GAD 7 Score 0     Review of Systems  All other systems reviewed and are negative.   Negative unless indicated in HPI   Objective:     BP (!) 143/87   Pulse 79   Ht 5\' 6"  (1.676 m)   Wt 163 lb 8 oz (74.2 kg)   SpO2 100%   BMI 26.39 kg/m    Physical Exam Constitutional:      General: She is not in acute distress.    Appearance: Normal appearance. She is obese. She is not toxic-appearing or diaphoretic.  HENT:     Head: Normocephalic.     Nose: Nose normal.     Mouth/Throat:     Mouth: Mucous membranes are moist.     Dentition: Abnormal dentition. Dental caries present.     Pharynx: Oropharynx is clear.  Eyes:     Extraocular Movements: Extraocular movements intact.     Pupils: Pupils are equal, round, and reactive to light.  Cardiovascular:     Rate and Rhythm: Normal rate and regular rhythm.     Pulses: Normal pulses.  Radial pulses are 2+ on the right side and 2+ on the left side.       Dorsalis pedis pulses are 2+ on the right side and 2+ on the left side.       Posterior tibial pulses are 2+ on the right side and 2+ on the left side.     Heart sounds: Normal heart sounds. No murmur heard.    No friction rub. No gallop.  Pulmonary:     Effort: No respiratory distress.     Breath sounds: No stridor. No wheezing, rhonchi or rales.  Chest:     Chest wall: No tenderness.  Musculoskeletal:     Right lower leg: No edema.     Left lower leg: No edema.  Skin:    General: Skin is warm and dry.     Capillary Refill: Capillary refill takes less than 2 seconds.  Neurological:     General: No focal deficit present.     Mental Status: She is alert and oriented to person, place, and time. Mental status is at baseline.  Psychiatric:         Mood and Affect: Mood normal.        Behavior: Behavior normal.        Thought Content: Thought content normal.        Judgment: Judgment normal.    No results found for any visits on 08/08/23.    The 10-year ASCVD risk score (Arnett DK, et al., 2019) is: 25.1%    Assessment & Plan:  Primary hypertension Assessment & Plan: Chronic hypertension managed with lisinopril and atenolol.  Lightheadedness with amlodipine led to its discontinuation.  Slightly elevated blood pressure likely due to missed lisinopril dose today per pt as she had ran out.  - Continue atenolol. - Continue Lisinopril 40 mg daily    Orders: -     Lisinopril; Take 2 tablets (40 mg total) by mouth daily.  Dispense: 180 tablet; Refill: 1 -     Atorvastatin Calcium; Take 1 tablet (40 mg total) by mouth daily.  Dispense: 90 tablet; Refill: 3 -     Atenolol; Take 1 tablet (50 mg total) by mouth daily.  Dispense: 90 tablet; Refill: 1  Schizoaffective disorder, depressive type (HCC) Assessment & Plan: Chronic Stable Followed by Psych - Dr. Jennelle Human Continue Nortriptyline and perphenazine daily by Dr. Jennelle Human    Mixed hyperlipidemia Assessment & Plan: Chronic Continue Atorvastatin 40mg  daily Plan to recheck labs at next visit Continue to make conscious decisions for well balanced diet smaller portions with increase protein, fruits, veggies, water as drink of choice, decrease starches, processed foods, and saturated fats. Increase weekly exercise - 150 minutes per week.       Return in about 4 months (around 12/08/2023) for annual physical.   I, Sallee Provencal, FNP, have reviewed all documentation for this visit. The documentation on 08/08/23 for the exam, diagnosis, procedures, and orders are all accurate and complete.   Sallee Provencal, FNP

## 2023-09-23 NOTE — Progress Notes (Unsigned)
 Cardiology Office Note    Date:  09/24/2023   ID:  Jessica Little, DOB 15-Jan-1946, MRN [id number]  PCP:  Tasia Farr, FNP  Cardiologist:  Constancia Delton, MD  Electrophysiologist:  Boyce Byes, MD   Chief Complaint: Follow up  History of Present Illness:   Jessica Little is a 78 y.o. female with history of syncope, HTN, HLD, CKD stage IIIa, carotid artery stenosis, depression, anxiety, panic disorder with agoraphobia, and schizoaffective disorder who presents for follow-up of syncope.  She was evaluated in 2022 with 2 different types of syncope.  Some episodes had a prodrome and were consistent with orthostatic hypotension and syncope.  Second form of syncope had no prodrome and occurred while seated.  Subsequent Zio patch showed a predominant rhythm of sinus with an average rate of 71 bpm with 12 episodes of nonsustained VT lasting up to 14 beats, 17 episodes of SVT lasting up to 20 beats, and rare atrial and ventricular ectopy.  Echo in 2022 showed an EF of 60 to 65%, no regional wall motion normalities, mild LVH, grade 1 diastolic dysfunction, normal RV systolic function and ventricular cavity size, normal RVSP, small pericardial effusion, and mild mitral regurgitation.  Carotid artery ultrasound in 2023 showed less than 50% bilateral ICA stenosis with antegrade flow of the bilateral vertebral arteries.  She was subsequently evaluated by EP with no subsequent ILR showing no evidence of arrhythmias or prolonged pauses.  She preferred to discontinue monitoring in 05/2022 due to cost.  Carotid artery ultrasound in 09/2022 showed 1 to 39% bilateral ICA stenosis with bilateral antegrade flow of the vertebral arteries and normal flow hemodynamics of the bilateral subclavian arteries.  She comes in doing very well from a cardiac perspective and is without symptoms of angina or cardiac decompensation.  No dizziness, presyncope, or further syncope.  No lower extremity swelling or  progressive orthopnea.  No falls or symptoms concerning for bleeding.  She does try and monitor her sodium intake.  Currently on atenolol  50 mg and lisinopril  40 mg for hypertension.  While on amlodipine  and she noted some orthostasis that has improved following discontinuation.  Overall, she feels well from a cardiac perspective and does not have any acute concerns at this time.   Labs independently reviewed: 03/2023 - Hgb 12.6, PLT 220, BUN 24, serum creatinine 1.31, potassium 3.9, albumin 4.1 07/2022 - TC 154, TG 217, HDL 57, LDL 62, AST/ALT normal, A1c 5.8 08/2021 - TSH normal  Past Medical History:  Diagnosis Date   Anxiety    GERD (gastroesophageal reflux disease)    Hyperlipidemia    Hypertension     Past Surgical History:  Procedure Laterality Date   CATARACT EXTRACTION W/PHACO Left 01/18/2021   Procedure: CATARACT EXTRACTION PHACO AND INTRAOCULAR LENS PLACEMENT (IOC) LEFT 7.40 01:11.7;  Surgeon: Annell Kidney, MD;  Location: [facility];  Service: Ophthalmology;  Laterality: Left;   CATARACT EXTRACTION W/PHACO Right 02/01/2021   Procedure: CATARACT EXTRACTION PHACO AND INTRAOCULAR LENS PLACEMENT (IOC) RIGHT EYHANCE TORIC 8.60 01:09.9 ;  Surgeon: Annell Kidney, MD;  Location: [facility];  Service: Ophthalmology;  Laterality: Right;   CHOLECYSTECTOMY     COLONOSCOPY  01/01/2006   COLONOSCOPY WITH PROPOFOL  N/A 08/23/2015   Procedure: COLONOSCOPY WITH PROPOFOL ;  Surgeon: Marshall Skeeter, MD;  Location: [facility];  Service: Endoscopy;  Laterality: N/A;   heart monitor Right 10/22/2021   HIATAL HERNIA REPAIR  2019    Current Medications: Current Meds  Medication  Sig   acetaminophen  (TYLENOL ) 500 MG tablet Take 1 tablet (500 mg total) by mouth every 6 (six) hours as needed.   amLODipine  (NORVASC ) 5 MG tablet Take 1 tablet (5 mg total) by mouth daily.   amoxicillin -clavulanate (AUGMENTIN ) 875-125 MG tablet Take 1 tablet by mouth 2 (two) times  daily.   aspirin  EC 81 MG tablet Take 81 mg by mouth daily. Swallow whole.   atenolol  (TENORMIN ) 50 MG tablet Take 1 tablet (50 mg total) by mouth daily.   atorvastatin  (LIPITOR) 40 MG tablet Take 1 tablet (40 mg total) by mouth daily.   B Complex-C (B-COMPLEX WITH VITAMIN C) tablet Take 1 tablet by mouth daily.   Biotin 96295 MCG TABS Take by mouth.   Calcium -Magnesium-Zinc 500-250-12.5 MG TABS Take 1 tablet by mouth daily. Dose unknown   Cholecalciferol (VITAMIN D3 PO) Take by mouth daily.   erythromycin  ophthalmic ointment Place 1 Application into the left eye at bedtime. 1 cm thin ribbon   Flaxseed, Linseed, (FLAXSEED OIL) 1000 MG CAPS Take by mouth.   lisinopril  (ZESTRIL ) 20 MG tablet Take 2 tablets (40 mg total) by mouth daily.   nortriptyline  (PAMELOR ) 50 MG capsule Take 1 capsule (50 mg total) by mouth at bedtime.   omeprazole  (PRILOSEC) 40 MG capsule Take 1 capsule by mouth twice daily   perphenazine  (TRILAFON ) 4 MG tablet TAKE 1 TABLET BY MOUTH AT BEDTIME   potassium chloride  (KLOR-CON  M) 10 MEQ tablet Take 1 tablet (10 mEq total) by mouth daily.   vitamin E 1000 UNIT capsule Take 1,000 Units by mouth daily.    Allergies:   Patient has no known allergies.   Social History   Socioeconomic History   Marital status: Widowed    Spouse name: Not on file   Number of children: 0   Years of education: College   Highest education level: Bachelor's degree (e.g., BA, AB, BS)  Occupational History   Occupation: Retired  Tobacco Use   Smoking status: Never   Smokeless tobacco: Never  Vaping Use   Vaping status: Never Used  Substance and Sexual Activity   Alcohol use: No   Drug use: No   Sexual activity: Not on file  Other Topics Concern   Not on file  Social History Narrative   Right handed   No caffeine   One story home   Social Drivers of Health   Financial Resource Strain: Low Risk  (10/31/2022)   Overall Financial Resource Strain (CARDIA)    Difficulty of Paying  Living Expenses: Not hard at all  Food Insecurity: No Food Insecurity (10/31/2022)   Hunger Vital Sign    Worried About Running Out of Food in the Last Year: Never true    Ran Out of Food in the Last Year: Never true  Transportation Needs: No Transportation Needs (10/31/2022)   PRAPARE - Administrator, Civil Service (Medical): No    Lack of Transportation (Non-Medical): No  Physical Activity: Sufficiently Active (10/31/2022)   Exercise Vital Sign    Days of Exercise per Week: 5 days    Minutes of Exercise per Session: 30 min  Stress: No Stress Concern Present (10/31/2022)   [employer] of Occupational Health - Occupational Stress Questionnaire    Feeling of Stress : Not at all  Social Connections: Moderately Isolated (10/31/2022)   Social Connection and Isolation Panel [NHANES]    Frequency of Communication with Friends and Family: More than three times a week    Frequency  of Social Gatherings with Friends and Family: Three times a week    Attends Religious Services: More than 4 times per year    Active Member of Clubs or Organizations: No    Attends Banker Meetings: Never    Marital Status: Widowed     Family History:  The patient's family history includes Arrhythmia in her mother; Depression in her sister; Healthy in her brother; Heart attack in her father; Hypertension in her sister; Stroke in her father. There is no history of Breast cancer.  ROS:   12-point review of systems is negative unless otherwise noted in the HPI.   EKGs/Labs/Other Studies Reviewed:    Studies reviewed were summarized above. The additional studies were reviewed today:  2D echo on 05/02/2021: 1. Left ventricular ejection fraction, by estimation, is 60 to 65%. The  left ventricle has normal function. The left ventricle has no regional  wall motion abnormalities. There is mild left ventricular hypertrophy.  Left ventricular diastolic parameters  are consistent with Grade I  diastolic dysfunction (impaired relaxation).  The average left ventricular global longitudinal strain is -16.4 %. The  global longitudinal strain is normal.   2. Right ventricular systolic function is normal. The right ventricular  size is normal. There is normal pulmonary artery systolic pressure. The  estimated right ventricular systolic pressure is 27.3 mmHg.   3. A small pericardial effusion is present.   4. The mitral valve is normal in structure. Mild mitral valve  regurgitation. No evidence of mitral stenosis.  __________  Zio patch 02/2021: Patient had a min HR of 47 bpm, max HR of 176 bpm, and avg HR of 71 bpm. Predominant underlying rhythm was Sinus Rhythm. 12 Ventricular Tachycardia runs occurred, the run with the fastest interval lasting 14 beats with a max rate of 176 bpm (avg 149  bpm); the run with the fastest interval was also the longest. 17 Supraventricular Tachycardia runs occurred, the run with the fastest interval lasting 4 beats with a max rate of 164 bpm, the longest lasting 20 beats with an avg rate of 119 bpm. Isolated  SVEs were rare (<1.0%), SVE Couplets were rare (<1.0%), and SVE Triplets were rare (<1.0%). Isolated VEs were rare (<1.0%, 868), VE Couplets were rare (<1.0%, 27), and VE Triplets were rare (<1.0%, 5).    Paroxysmal SVT and nonsustained VT noted.  No arrhythmias to suggest etiology of syncope noted.  Patient triggered events were associated with sinus rhythm   EKG:  EKG is ordered today.  The EKG ordered today demonstrates NSR, 83 bpm, first-degree AV block, nonspecific ST-T changes  Recent Labs: No results found for requested labs within last 365 days.  Recent Lipid Panel    Component Value Date/Time   CHOL 154 07/30/2022 1502   TRIG 217 (H) 07/30/2022 1502   HDL 57 07/30/2022 1502   CHOLHDL 2.7 07/30/2022 1502   LDLCALC 62 07/30/2022 1502    PHYSICAL EXAM:    VS:  BP 132/74   Pulse 83   Ht 5\' 6"  (1.676 m)   Wt 164 lb (74.4 kg)   SpO2  99%   BMI 26.47 kg/m   BMI: Body mass index is 26.47 kg/m.  Physical Exam Vitals reviewed.  Constitutional:      Appearance: She is well-developed.  HENT:     Head: Normocephalic and atraumatic.  Eyes:     General:        Right eye: No discharge.  Left eye: No discharge.  Cardiovascular:     Rate and Rhythm: Normal rate and regular rhythm.     Pulses:          Dorsalis pedis pulses are 2+ on the right side and 2+ on the left side.       Posterior tibial pulses are 2+ on the right side and 2+ on the left side.     Heart sounds: Normal heart sounds, S1 normal and S2 normal. Heart sounds not distant. No midsystolic click and no opening snap. No murmur heard.    No friction rub.  Pulmonary:     Effort: Pulmonary effort is normal. No respiratory distress.     Breath sounds: Normal breath sounds. No decreased breath sounds, wheezing, rhonchi or rales.  Chest:     Chest wall: No tenderness.  Musculoskeletal:     Cervical back: Normal range of motion.     Right lower leg: No edema.     Left lower leg: No edema.  Skin:    General: Skin is warm and dry.     Nails: There is no clubbing.  Neurological:     Mental Status: She is alert and oriented to person, place, and time.  Psychiatric:        Speech: Speech normal.        Behavior: Behavior normal.        Thought Content: Thought content normal.        Judgment: Judgment normal.     Wt Readings from Last 3 Encounters:  09/24/23 164 lb (74.4 kg)  08/08/23 163 lb 8 oz (74.2 kg)  03/11/23 162 lb 3.2 oz (73.6 kg)     ASSESSMENT & PLAN:   Syncope: No further episodes.  No evidence of significant structural cardiac disease or evidence of arrhythmia, high-grade AV block, or prolonged pauses on external and internal cardiac monitoring.  For now, she prefers to leave ILR implanted.  No further cardiac testing anticipated at this time.  NSVT/PSVT: Asymptomatic.  Remains on atenolol .  HTN: Blood pressure is well-controlled.   She remains on atenolol  50 mg and lisinopril  40 mg.  Low-sodium diet encouraged.  Carotid artery stenosis: Ultrasound in 09/2022 showed 1 to 39% bilateral ICA stenosis.  Followed by vascular surgery.  She remains on aspirin  81 mg and atorvastatin  40 mg.    Disposition: F/u with Dr. Junnie Olives or an APP in 12 months, and EP as directed.    Medication Adjustments/Labs and Tests Ordered: Current medicines are reviewed at length with the patient today.  Concerns regarding medicines are outlined above. Medication changes, Labs and Tests ordered today are summarized above and listed in the Patient Instructions accessible in Encounters.   Signed, Varney Gentleman, PA-C 09/24/2023 4:47 PM     [facility] [place] [address] [place], [place] [address] [phone]

## 2023-09-24 ENCOUNTER — Ambulatory Visit: Attending: Physician Assistant | Admitting: Physician Assistant

## 2023-09-24 ENCOUNTER — Encounter: Payer: Self-pay | Admitting: Physician Assistant

## 2023-09-24 VITALS — BP 132/74 | HR 83 | Ht 66.0 in | Wt 164.0 lb

## 2023-09-24 DIAGNOSIS — I10 Essential (primary) hypertension: Secondary | ICD-10-CM | POA: Diagnosis not present

## 2023-09-24 DIAGNOSIS — R55 Syncope and collapse: Secondary | ICD-10-CM

## 2023-09-24 DIAGNOSIS — I6523 Occlusion and stenosis of bilateral carotid arteries: Secondary | ICD-10-CM

## 2023-09-24 DIAGNOSIS — I4729 Other ventricular tachycardia: Secondary | ICD-10-CM

## 2023-09-24 NOTE — Patient Instructions (Signed)
 Medication Instructions:  Your Physician recommend you continue on your current medication as directed.    *If you need a refill on your cardiac medications before your next appointment, please call your pharmacy*  Lab Work: No labs ordered today  If you have labs (blood work) drawn today and your tests are completely normal, you will receive your results only by: MyChart Message (if you have MyChart) OR A paper copy in the mail If you have any lab test that is abnormal or we need to change your treatment, we will call you to review the results.  Testing/Procedures: No test ordered today   Follow-Up: At [facility], you and your health needs are our priority.  As part of our continuing mission to provide you with exceptional heart care, our providers are all part of one team.  This team includes your primary Cardiologist (physician) and Advanced Practice Providers or APPs (Physician Assistants and Nurse Practitioners) who all work together to provide you with the care you need, when you need it.  Your next appointment:   1 year(s)  Provider:   Constancia Delton, MD or Varney Gentleman, PA-C    We recommend signing up for the patient portal called "MyChart".  Sign up information is provided on this After Visit Summary.  MyChart is used to connect with patients for Virtual Visits (Telemedicine).  Patients are able to view lab/test results, encounter notes, upcoming appointments, etc.  Non-urgent messages can be sent to your provider as well.   To learn more about what you can do with MyChart, go to [email or web address].

## 2023-10-09 DIAGNOSIS — I10 Essential (primary) hypertension: Secondary | ICD-10-CM | POA: Diagnosis not present

## 2023-10-09 DIAGNOSIS — N1832 Chronic kidney disease, stage 3b: Secondary | ICD-10-CM | POA: Diagnosis not present

## 2023-10-09 DIAGNOSIS — E876 Hypokalemia: Secondary | ICD-10-CM | POA: Diagnosis not present

## 2023-10-09 DIAGNOSIS — K219 Gastro-esophageal reflux disease without esophagitis: Secondary | ICD-10-CM | POA: Diagnosis not present

## 2023-10-09 DIAGNOSIS — I129 Hypertensive chronic kidney disease with stage 1 through stage 4 chronic kidney disease, or unspecified chronic kidney disease: Secondary | ICD-10-CM | POA: Diagnosis not present

## 2023-10-15 DIAGNOSIS — I10 Essential (primary) hypertension: Secondary | ICD-10-CM | POA: Diagnosis not present

## 2023-10-15 DIAGNOSIS — N1832 Chronic kidney disease, stage 3b: Secondary | ICD-10-CM | POA: Diagnosis not present

## 2023-10-15 DIAGNOSIS — E785 Hyperlipidemia, unspecified: Secondary | ICD-10-CM | POA: Diagnosis not present

## 2023-10-15 DIAGNOSIS — K219 Gastro-esophageal reflux disease without esophagitis: Secondary | ICD-10-CM | POA: Diagnosis not present

## 2023-10-22 ENCOUNTER — Ambulatory Visit (INDEPENDENT_AMBULATORY_CARE_PROVIDER_SITE_OTHER): Payer: Medicare Other

## 2023-10-22 ENCOUNTER — Ambulatory Visit (INDEPENDENT_AMBULATORY_CARE_PROVIDER_SITE_OTHER): Payer: Medicare Other | Admitting: Nurse Practitioner

## 2023-10-22 ENCOUNTER — Encounter (INDEPENDENT_AMBULATORY_CARE_PROVIDER_SITE_OTHER): Payer: Self-pay | Admitting: Nurse Practitioner

## 2023-10-22 VITALS — BP 130/74 | HR 82 | Resp 18 | Ht 66.0 in | Wt 163.0 lb

## 2023-10-22 DIAGNOSIS — E7800 Pure hypercholesterolemia, unspecified: Secondary | ICD-10-CM | POA: Diagnosis not present

## 2023-10-22 DIAGNOSIS — I6523 Occlusion and stenosis of bilateral carotid arteries: Secondary | ICD-10-CM | POA: Diagnosis not present

## 2023-10-22 DIAGNOSIS — I10 Essential (primary) hypertension: Secondary | ICD-10-CM | POA: Diagnosis not present

## 2023-10-22 NOTE — Progress Notes (Signed)
 Subjective:    Patient ID: Jessica Little, female    DOB: 16-Jan-1946, 78 y.o.   MRN: [id number] Chief Complaint  Patient presents with   Venous Insufficiency    1 year carotid + see jd/fb    The patient is seen for follow up evaluation of carotid stenosis. The carotid stenosis followed by ultrasound.   The patient denies amaurosis fugax. There is no recent history of TIA symptoms or focal motor deficits. There is no prior documented CVA.  The patient is taking enteric-coated aspirin  81 mg daily.  There is no history of migraine headaches. There is no history of seizures.  No recent shortening of the patient's walking distance or new symptoms consistent with claudication.  No history of rest pain symptoms. No new ulcers or wounds of the lower extremities have occurred.  There is no history of DVT, PE or superficial thrombophlebitis. No documented recent episodes of angina or shortness of breath documented.   Carotid Duplex done today shows <40% bilaterally.  No change compared to last study in 2024    Review of Systems  All other systems reviewed and are negative.      Objective:    Physical Exam Vitals reviewed.  HENT:     Head: Normocephalic.  Cardiovascular:     Rate and Rhythm: Normal rate.  Pulmonary:     Effort: Pulmonary effort is normal.  Skin:    General: Skin is warm and dry.  Neurological:     Mental Status: She is alert and oriented to person, place, and time.  Psychiatric:        Mood and Affect: Mood normal.        Behavior: Behavior normal.        Thought Content: Thought content normal.        Judgment: Judgment normal.     BP 130/74 (BP Location: Left Arm, Patient Position: Sitting, Cuff Size: Large)   Pulse 82   Resp 18   Ht 5\' 6"  (1.676 m)   Wt 163 lb (73.9 kg)   BMI 26.31 kg/m   Past Medical History:  Diagnosis Date   Anxiety    GERD (gastroesophageal reflux disease)    Hyperlipidemia    Hypertension     Social History    Socioeconomic History   Marital status: Widowed    Spouse name: Not on file   Number of children: 0   Years of education: College   Highest education level: Bachelor's degree (e.g., BA, AB, BS)  Occupational History   Occupation: Retired  Tobacco Use   Smoking status: Never   Smokeless tobacco: Never  Vaping Use   Vaping status: Never Used  Substance and Sexual Activity   Alcohol use: No   Drug use: No   Sexual activity: Not on file  Other Topics Concern   Not on file  Social History Narrative   Right handed   No caffeine   One story home   Social Drivers of Health   Financial Resource Strain: Low Risk  (10/31/2022)   Overall Financial Resource Strain (CARDIA)    Difficulty of Paying Living Expenses: Not hard at all  Food Insecurity: No Food Insecurity (10/31/2022)   Hunger Vital Sign    Worried About Running Out of Food in the Last Year: Never true    Ran Out of Food in the Last Year: Never true  Transportation Needs: No Transportation Needs (10/31/2022)   PRAPARE - Transportation    Lack of Transportation (  Medical): No    Lack of Transportation (Non-Medical): No  Physical Activity: Sufficiently Active (10/31/2022)   Exercise Vital Sign    Days of Exercise per Week: 5 days    Minutes of Exercise per Session: 30 min  Stress: No Stress Concern Present (10/31/2022)   [employer] of Occupational Health - Occupational Stress Questionnaire    Feeling of Stress : Not at all  Social Connections: Moderately Isolated (10/31/2022)   Social Connection and Isolation Panel [NHANES]    Frequency of Communication with Friends and Family: More than three times a week    Frequency of Social Gatherings with Friends and Family: Three times a week    Attends Religious Services: More than 4 times per year    Active Member of Clubs or Organizations: No    Attends Banker Meetings: Never    Marital Status: Widowed  Intimate Partner Violence: Not At Risk (10/31/2022)    Humiliation, Afraid, Rape, and Kick questionnaire    Fear of Current or Ex-Partner: No    Emotionally Abused: No    Physically Abused: No    Sexually Abused: No    Past Surgical History:  Procedure Laterality Date   CATARACT EXTRACTION W/PHACO Left 01/18/2021   Procedure: CATARACT EXTRACTION PHACO AND INTRAOCULAR LENS PLACEMENT (IOC) LEFT 7.40 01:11.7;  Surgeon: Annell Kidney, MD;  Location: [facility];  Service: Ophthalmology;  Laterality: Left;   CATARACT EXTRACTION W/PHACO Right 02/01/2021   Procedure: CATARACT EXTRACTION PHACO AND INTRAOCULAR LENS PLACEMENT (IOC) RIGHT EYHANCE TORIC 8.60 01:09.9 ;  Surgeon: Annell Kidney, MD;  Location: [facility];  Service: Ophthalmology;  Laterality: Right;   CHOLECYSTECTOMY     COLONOSCOPY  01/01/2006   COLONOSCOPY WITH PROPOFOL  N/A 08/23/2015   Procedure: COLONOSCOPY WITH PROPOFOL ;  Surgeon: Marshall Skeeter, MD;  Location: [facility];  Service: Endoscopy;  Laterality: N/A;   heart monitor Right 10/22/2021   HIATAL HERNIA REPAIR  2019    Family History  Problem Relation Age of Onset   Arrhythmia Mother    Heart attack Father    Stroke Father    Depression Sister    Hypertension Sister    Healthy Brother    Breast cancer Neg Hx     No Known Allergies     Latest Ref Rng & Units 07/30/2022    3:02 PM 10/09/2021   10:17 AM 09/24/2021    4:36 AM  CBC  WBC 3.4 - 10.8 x10E3/uL 5.7  7.3  7.5   Hemoglobin 11.1 - 15.9 g/dL 57.8  46.9  62.9   Hematocrit 34.0 - 46.6 % 40.2  37.5  38.0   Platelets 150 - 450 x10E3/uL 254  277  191        CMP     Component Value Date/Time   NA 142 07/30/2022 1502   NA 144 04/30/2014 1321   K 3.7 07/30/2022 1502   K 3.3 (L) 04/30/2014 1321   CL 103 07/30/2022 1502   CL 102 04/30/2014 1321   CO2 25 07/30/2022 1502   CO2 28 04/30/2014 1321   GLUCOSE 103 (H) 07/30/2022 1502   GLUCOSE 114 (H) 09/24/2021 0436   GLUCOSE 120 (H) 04/30/2014 1321   BUN 21 07/30/2022 1502    BUN 37 (H) 04/30/2014 1321   CREATININE 1.17 (H) 07/30/2022 1502   CREATININE 1.75 (H) 04/30/2014 1321   CALCIUM  9.2 07/30/2022 1502   CALCIUM  8.8 04/30/2014 1321   PROT 6.5 07/30/2022 1502   PROT 7.6 04/30/2014  1321   ALBUMIN 4.3 07/30/2022 1502   ALBUMIN 3.9 04/30/2014 1321   AST 26 07/30/2022 1502   AST 32 04/30/2014 1321   ALT 25 07/30/2022 1502   ALT 27 04/30/2014 1321   ALKPHOS 88 07/30/2022 1502   ALKPHOS 73 04/30/2014 1321   BILITOT 0.4 07/30/2022 1502   BILITOT 0.6 04/30/2014 1321   EGFR 49 (L) 07/30/2022 1502   GFRNONAA 43 (L) 09/24/2021 0436   GFRNONAA 31 (L) 04/30/2014 1321   GFRNONAA 26 (L) 02/01/2014 1705     No results found.     Assessment & Plan:   1. Bilateral carotid artery stenosis (Primary) Recommend:  Given the patient's asymptomatic subcritical stenosis no further invasive testing or surgery at this time.  Duplex ultrasound shows <40%  stenosis bilaterally.  Continue antiplatelet therapy as prescribed Continue management of CAD, HTN and Hyperlipidemia Healthy heart diet,  encouraged exercise at least 4 times per week  Follow up in 12 months with duplex ultrasound and physical exam   2. Primary hypertension Continue antihypertensive medications as already ordered, these medications have been reviewed and there are no changes at this time.  3. Pure hypercholesterolemia Continue statin as ordered and reviewed, no changes at this time   Current Outpatient Medications on File Prior to Visit  Medication Sig Dispense Refill   acetaminophen  (TYLENOL ) 500 MG tablet Take 1 tablet (500 mg total) by mouth every 6 (six) hours as needed.     amLODipine  (NORVASC ) 5 MG tablet Take 1 tablet (5 mg total) by mouth daily. 90 tablet 1   aspirin  EC 81 MG tablet Take 81 mg by mouth daily. Swallow whole.     atenolol  (TENORMIN ) 50 MG tablet Take 1 tablet (50 mg total) by mouth daily. 90 tablet 1   atorvastatin  (LIPITOR) 40 MG tablet Take 1 tablet (40 mg total)  by mouth daily. 90 tablet 3   B Complex-C (B-COMPLEX WITH VITAMIN C) tablet Take 1 tablet by mouth daily.     Biotin 57846 MCG TABS Take by mouth.     Calcium -Magnesium-Zinc 500-250-12.5 MG TABS Take 1 tablet by mouth daily. Dose unknown     Cholecalciferol (VITAMIN D3 PO) Take by mouth daily.     lisinopril  (ZESTRIL ) 20 MG tablet Take 2 tablets (40 mg total) by mouth daily. 180 tablet 1   nortriptyline  (PAMELOR ) 50 MG capsule Take 1 capsule (50 mg total) by mouth at bedtime. 90 capsule 3   omeprazole  (PRILOSEC) 40 MG capsule Take 1 capsule by mouth twice daily 180 capsule 1   perphenazine  (TRILAFON ) 4 MG tablet TAKE 1 TABLET BY MOUTH AT BEDTIME 90 tablet 1   potassium chloride  (KLOR-CON  M) 10 MEQ tablet Take 1 tablet (10 mEq total) by mouth daily. 90 tablet 0   vitamin E 1000 UNIT capsule Take 1,000 Units by mouth daily.     amoxicillin -clavulanate (AUGMENTIN ) 875-125 MG tablet Take 1 tablet by mouth 2 (two) times daily. (Patient not taking: Reported on 10/22/2023) 14 tablet 0   erythromycin  ophthalmic ointment Place 1 Application into the left eye at bedtime. 1 cm thin ribbon (Patient not taking: Reported on 10/22/2023) 3.5 g 0   Flaxseed, Linseed, (FLAXSEED OIL) 1000 MG CAPS Take by mouth. (Patient not taking: Reported on 10/22/2023)     No current facility-administered medications on file prior to visit.    There are no Patient Instructions on file for this visit. No follow-ups on file.   Kearra Calkin E Katherin Ramey, NP

## 2023-10-24 ENCOUNTER — Other Ambulatory Visit: Payer: Self-pay | Admitting: Family Medicine

## 2023-10-24 DIAGNOSIS — E876 Hypokalemia: Secondary | ICD-10-CM

## 2023-10-30 ENCOUNTER — Ambulatory Visit: Payer: Medicare Other | Admitting: Psychiatry

## 2023-11-01 ENCOUNTER — Telehealth: Payer: Self-pay | Admitting: Family Medicine

## 2023-11-01 ENCOUNTER — Ambulatory Visit: Payer: Self-pay | Admitting: Urology

## 2023-11-01 DIAGNOSIS — H0014 Chalazion left upper eyelid: Secondary | ICD-10-CM

## 2023-11-01 DIAGNOSIS — J0140 Acute pansinusitis, unspecified: Secondary | ICD-10-CM

## 2023-11-01 MED ORDER — ERYTHROMYCIN 5 MG/GM OP OINT: 1.0000 | TOPICAL_OINTMENT | Freq: Every day | OPHTHALMIC | 0 refills | Status: AC

## 2023-11-01 NOTE — Telephone Encounter (Signed)
 [facility] pharmacy is requesting refill erythromycin  ophthalmic ointment  Please advise

## 2023-11-05 ENCOUNTER — Ambulatory Visit: Payer: Self-pay

## 2023-11-05 DIAGNOSIS — Z1231 Encounter for screening mammogram for malignant neoplasm of breast: Secondary | ICD-10-CM

## 2023-11-05 DIAGNOSIS — Z0000 Encounter for general adult medical examination without abnormal findings: Secondary | ICD-10-CM | POA: Diagnosis not present

## 2023-11-05 DIAGNOSIS — Z780 Asymptomatic menopausal state: Secondary | ICD-10-CM

## 2023-11-05 NOTE — Patient Instructions (Addendum)
 Jessica Little , Thank you for taking time out of your busy schedule to complete your Annual Wellness Visit with me. I enjoyed our conversation and look forward to speaking with you again next year. I, as well as your care team,  appreciate your ongoing commitment to your health goals. Please review the following plan we discussed and let me know if I can assist you in the future.  You have an order for:  []   2D Mammogram  [x]   3D Mammogram  [x]   Bone Density     Please call for appointment:  [facility]  [address] [place] [address] [phone] [facility] [address] [place], [place] [address] [phone] [facility] [address] Tracey [address] [place], [place] [address] [phone]   Make sure to wear two-piece clothing.  No lotions, powders, or deodorants the day of the appointment. Make sure to bring picture ID and insurance card.  Bring list of medications you are currently taking including any supplements.   Schedule your [place] screening mammogram through MyChart!   Log into your MyChart account.  Go to 'Visit' (or 'Appointments' if on mobile App) --> Schedule an Appointment  Under 'Select a Reason for Visit' choose the Mammogram Screening option.  Complete the pre-visit questions and select the time and place that best fits your schedule.   Follow up Visits: Next Medicare AWV with our clinical staff:   11/17/24 @ 10:10 AM BY PHONE Have you seen your provider in the last 6 months (3 months if uncontrolled diabetes)? Yes   Clinician Recommendations:  Aim for 30 minutes of exercise or brisk walking, 6-8 glasses of water, and 5 servings of fruits and vegetables each day. TAKE CARE!      This is a list of the screening recommended for you and due dates:  Health Maintenance  Topic Date Due   Zoster (Shingles) Vaccine (1 of 2) Never done    DTaP/Tdap/Td vaccine (2 - Tdap) 10/25/2010   Mammogram  06/02/2021   DEXA scan (bone density measurement)  01/02/2022   COVID-19 Vaccine (3 - 2024-25 season) 01/27/2023   Medicare Annual Wellness Visit  11/04/2024   Pneumonia Vaccine  Completed   Hepatitis C Screening  Completed   HPV Vaccine  Aged Out   Meningitis B Vaccine  Aged Out   Colon Cancer Screening  Discontinued    Advanced directives: (ACP Link)Information on Advanced Care Planning can be found at [place]  Secretary of [facility] Advance Health Care Directives Advance Health Care Directives. [email or web address]  Advance Care Planning is important because it:  [x]  Makes sure you receive the medical care that is consistent with your values, goals, and preferences  [x]  It provides guidance to your family and loved ones and reduces their decisional burden about whether or not they are making the right decisions based on your wishes.  Follow the link provided in your after visit summary or read over the paperwork we have mailed to you to help you started getting your Advance Directives in place. If you need assistance in completing these, please reach out to us  so that we can help you!

## 2023-11-05 NOTE — Progress Notes (Signed)
 Subjective:   Jessica Little is a 78 y.o. who presents for a Medicare Wellness preventive visit.  As a reminder, Annual Wellness Visits don't include a physical exam, and some assessments may be limited, especially if this visit is performed virtually. We may recommend an in-person follow-up visit with your provider if needed.  Visit Complete: Virtual I connected with  Chantell M Maniaci on 11/05/23 by a audio enabled telemedicine application and verified that I am speaking with the correct person using two identifiers.  Patient Location: Home  Provider Location: Office/Clinic  I discussed the limitations of evaluation and management by telemedicine. The patient expressed understanding and agreed to proceed.  Vital Signs: Because this visit was a virtual/telehealth visit, some criteria may be missing or patient reported. Any vitals not documented were not able to be obtained and vitals that have been documented are patient reported.  VideoDeclined- This patient declined Librarian, academic. Therefore the visit was completed with audio only.  Persons Participating in Visit: Patient.  AWV Questionnaire: No: Patient Medicare AWV questionnaire was not completed prior to this visit.  Cardiac Risk Factors include: advanced age (>43men, >50 women);hypertension;dyslipidemia     Objective:     Today's Vitals   11/05/23 1349  PainSc: 0-No pain   There is no height or weight on file to calculate BMI.     11/05/2023    1:55 PM 10/31/2022    1:30 PM 12/12/2021    2:18 PM 11/08/2021   11:02 AM 09/23/2021    4:49 PM 09/22/2021   11:45 AM 08/07/2021    1:07 PM  Advanced Directives  Does Patient Have a Medical Advance Directive? No Yes No No No No No  Type of Furniture conservator/restorer;Living will       Would patient like information on creating a medical advance directive? No - Patient declined   No - Patient declined No - Patient declined  No -  Patient declined    Current Medications (verified) Outpatient Encounter Medications as of 11/05/2023  Medication Sig   aspirin  EC 81 MG tablet Take 81 mg by mouth daily. Swallow whole.   atenolol  (TENORMIN ) 50 MG tablet Take 1 tablet (50 mg total) by mouth daily.   atorvastatin  (LIPITOR) 40 MG tablet Take 1 tablet (40 mg total) by mouth daily.   B Complex-C (B-COMPLEX WITH VITAMIN C) tablet Take 1 tablet by mouth daily.   Biotin 16109 MCG TABS Take by mouth.   Calcium -Magnesium-Zinc 500-250-12.5 MG TABS Take 1 tablet by mouth daily. Dose unknown   Cholecalciferol (VITAMIN D3 PO) Take by mouth daily.   erythromycin  ophthalmic ointment Place 1 Application into the left eye at bedtime. 1 cm thin ribbon   Flaxseed, Linseed, (FLAXSEED OIL) 1000 MG CAPS Take by mouth.   lisinopril  (ZESTRIL ) 20 MG tablet Take 2 tablets (40 mg total) by mouth daily.   nortriptyline  (PAMELOR ) 50 MG capsule Take 1 capsule (50 mg total) by mouth at bedtime.   omeprazole  (PRILOSEC) 40 MG capsule Take 1 capsule by mouth twice daily   perphenazine  (TRILAFON ) 4 MG tablet TAKE 1 TABLET BY MOUTH AT BEDTIME   potassium chloride  (KLOR-CON ) 10 MEQ tablet Take 1 tablet (10 mEq total) by mouth daily.   vitamin E 1000 UNIT capsule Take 1,000 Units by mouth daily.   acetaminophen  (TYLENOL ) 500 MG tablet Take 1 tablet (500 mg total) by mouth every 6 (six) hours as needed. (Patient not taking: Reported on 11/05/2023)  amLODipine  (NORVASC ) 5 MG tablet Take 1 tablet (5 mg total) by mouth daily. (Patient not taking: Reported on 11/05/2023)   amoxicillin -clavulanate (AUGMENTIN ) 875-125 MG tablet Take 1 tablet by mouth 2 (two) times daily. (Patient not taking: Reported on 11/05/2023)   No facility-administered encounter medications on file as of 11/05/2023.    Allergies (verified) Patient has no known allergies.   History: Past Medical History:  Diagnosis Date   Anxiety    GERD (gastroesophageal reflux disease)    Hyperlipidemia     Hypertension    Past Surgical History:  Procedure Laterality Date   CATARACT EXTRACTION W/PHACO Left 01/18/2021   Procedure: CATARACT EXTRACTION PHACO AND INTRAOCULAR LENS PLACEMENT (IOC) LEFT 7.40 01:11.7;  Surgeon: Annell Kidney, MD;  Location: [facility];  Service: Ophthalmology;  Laterality: Left;   CATARACT EXTRACTION W/PHACO Right 02/01/2021   Procedure: CATARACT EXTRACTION PHACO AND INTRAOCULAR LENS PLACEMENT (IOC) RIGHT EYHANCE TORIC 8.60 01:09.9 ;  Surgeon: Annell Kidney, MD;  Location: [facility];  Service: Ophthalmology;  Laterality: Right;   CHOLECYSTECTOMY     COLONOSCOPY  01/01/2006   COLONOSCOPY WITH PROPOFOL  N/A 08/23/2015   Procedure: COLONOSCOPY WITH PROPOFOL ;  Surgeon: Marshall Skeeter, MD;  Location: [facility];  Service: Endoscopy;  Laterality: N/A;   heart monitor Right 10/22/2021   HIATAL HERNIA REPAIR  2019   Family History  Problem Relation Age of Onset   Arrhythmia Mother    Heart attack Father    Stroke Father    Depression Sister    Hypertension Sister    Healthy Brother    Breast cancer Neg Hx    Social History   Socioeconomic History   Marital status: Widowed    Spouse name: Not on file   Number of children: 0   Years of education: College   Highest education level: Bachelor's degree (e.g., BA, AB, BS)  Occupational History   Occupation: Retired  Tobacco Use   Smoking status: Never   Smokeless tobacco: Never  Vaping Use   Vaping status: Never Used  Substance and Sexual Activity   Alcohol use: No   Drug use: No   Sexual activity: Not on file  Other Topics Concern   Not on file  Social History Narrative   Right handed   No caffeine   One story home   Social Drivers of Health   Financial Resource Strain: Low Risk  (11/05/2023)   Overall Financial Resource Strain (CARDIA)    Difficulty of Paying Living Expenses: Not very hard  Food Insecurity: No Food Insecurity (11/05/2023)   Hunger Vital Sign     Worried About Running Out of Food in the Last Year: Never true    Ran Out of Food in the Last Year: Never true  Transportation Needs: No Transportation Needs (11/05/2023)   PRAPARE - Administrator, Civil Service (Medical): No    Lack of Transportation (Non-Medical): No  Physical Activity: Insufficiently Active (11/05/2023)   Exercise Vital Sign    Days of Exercise per Week: 3 days    Minutes of Exercise per Session: 30 min  Stress: No Stress Concern Present (11/05/2023)   [employer] of Occupational Health - Occupational Stress Questionnaire    Feeling of Stress : Only a little  Social Connections: Moderately Isolated (11/05/2023)   Social Connection and Isolation Panel [NHANES]    Frequency of Communication with Friends and Family: More than three times a week    Frequency of Social Gatherings with Friends and  Family: Twice a week    Attends Religious Services: More than 4 times per year    Active Member of Clubs or Organizations: No    Attends Banker Meetings: Never    Marital Status: Widowed    Tobacco Counseling Counseling given: Not Answered    Clinical Intake:  Pre-visit preparation completed: Yes  Pain : No/denies pain Pain Score: 0-No pain     BMI - recorded: 26.3 Nutritional Status: BMI 25 -29 Overweight Nutritional Risks: None Diabetes: No  Lab Results  Component Value Date   HGBA1C 5.8 (H) 07/30/2022   HGBA1C 5.6 09/22/2021   HGBA1C 5.7 (H) 08/03/2021     How often do you need to have someone help you when you read instructions, pamphlets, or other written materials from your doctor or pharmacy?: 1 - Never  Interpreter Needed?: No  Information entered by :: Dellie Fergusson, LPN   Activities of Daily Living     11/05/2023    1:56 PM  In your present state of health, do you have any difficulty performing the following activities:  Hearing? 0  Vision? 0  Difficulty concentrating or making decisions? 0  Walking or  climbing stairs? 0  Dressing or bathing? 0  Doing errands, shopping? 0  Preparing Food and eating ? N  Using the Toilet? N  In the past six months, have you accidently leaked urine? N  Do you have problems with loss of bowel control? N  Managing your Medications? N  Managing your Finances? N  Housekeeping or managing your Housekeeping? N    Patient Care Team: Tasia Farr, FNP as PCP - General (Family Medicine) Boyce Byes, MD as PCP - Electrophysiology (Cardiology) Constancia Delton, MD as PCP - Cardiology (Cardiology) Cottle, Kennedy Peabody., MD as Attending Physician (Psychiatry) Pa, [facility] (Optometry)  I have updated your Care Teams any recent Medical Services you may have received from other providers in the past year.     Assessment:    This is a routine wellness examination for Jessyca.  Hearing/Vision screen Hearing Screening - Comments:: NO AIDS Vision Screening - Comments:: READERS, HAD CATARACT SGY- DR.BRASINGTON   Goals Addressed             This Visit's Progress    Cut out extra servings         Depression Screen     11/05/2023    1:52 PM 08/08/2023    1:13 PM 03/11/2023    9:48 AM 10/31/2022    1:28 PM 07/30/2022    2:29 PM 05/25/2022    3:00 PM 02/07/2022   10:42 AM  PHQ 2/9 Scores  PHQ - 2 Score 0 0 0 0 0 0 0  PHQ- 9 Score 0 0   0 0 0    Fall Risk     11/05/2023    1:56 PM 10/31/2022    1:26 PM 07/30/2022    2:29 PM 05/25/2022    3:00 PM 02/07/2022   10:42 AM  Fall Risk   Falls in the past year? 0  0 0 0  Number falls in past yr: 0  0 0 0  Injury with Fall? 0  0 0 0  Risk for fall due to : No Fall Risks No Fall Risks  No Fall Risks No Fall Risks  Follow up Falls evaluation completed Education provided;Falls prevention discussed   Falls evaluation completed    MEDICARE RISK AT HOME:  Medicare Risk at Home  Any stairs in or around the home?: Yes If so, are there any without handrails?: No Home free of loose throw rugs in  walkways, pet beds, electrical cords, etc?: Yes Adequate lighting in your home to reduce risk of falls?: Yes Life alert?: No Use of a cane, walker or w/c?: No Grab bars in the bathroom?: Yes Shower chair or bench in shower?: No Elevated toilet seat or a handicapped toilet?: No  TIMED UP AND GO:  Was the test performed?  No  Cognitive Function: 6CIT completed        11/05/2023    1:57 PM 10/31/2022    1:33 PM 10/05/2016   11:25 AM  6CIT Screen  What Year? 0 points 0 points 0 points  What month? 0 points 0 points 0 points  What time? 0 points 0 points 0 points  Count back from 20 2 points 0 points 0 points  Months in reverse 0 points 0 points 0 points  Repeat phrase 0 points 0 points 2 points  Total Score 2 points 0 points 2 points    Immunizations Immunization History  Administered Date(s) Administered   Hepatitis A 08/13/2001   Moderna Sars-Covid-2 Vaccination 10/14/2019, 11/11/2019   Pneumococcal Conjugate-13 07/06/2015   Pneumococcal Polysaccharide-23 10/05/2016   Td 10/24/2000    Screening Tests Health Maintenance  Topic Date Due   Zoster Vaccines- Shingrix (1 of 2) Never done   DTaP/Tdap/Td (2 - Tdap) 10/25/2010   MAMMOGRAM  06/02/2021   DEXA SCAN  01/02/2022   COVID-19 Vaccine (3 - 2024-25 season) 01/27/2023   Medicare Annual Wellness (AWV)  11/04/2024   Pneumonia Vaccine 51+ Years old  Completed   Hepatitis C Screening  Completed   HPV VACCINES  Aged Out   Meningococcal B Vaccine  Aged Out   Colonoscopy  Discontinued    Health Maintenance  Health Maintenance Due  Topic Date Due   Zoster Vaccines- Shingrix (1 of 2) Never done   DTaP/Tdap/Td (2 - Tdap) 10/25/2010   MAMMOGRAM  06/02/2021   DEXA SCAN  01/02/2022   COVID-19 Vaccine (3 - 2024-25 season) 01/27/2023   Health Maintenance Items Addressed: DEXA ordered; MAMMOGRAM ORDERED; AGED OUT OF COLONOSCOPY; NEEDS TDAP, PNA; DECLINES MORE COVIDS OR SHINGRIX  Additional Screening:  Vision Screening:  Recommended annual ophthalmology exams for early detection of glaucoma and other disorders of the eye. Would you like a referral to an eye doctor? No    Dental Screening: Recommended annual dental exams for proper oral hygiene  Community Resource Referral / Chronic Care Management: CRR required this visit?  No   CCM required this visit?  No   Plan:    I have personally reviewed and noted the following in the patient's chart:   Medical and social history Use of alcohol, tobacco or illicit drugs  Current medications and supplements including opioid prescriptions. Patient is not currently taking opioid prescriptions. Functional ability and status Nutritional status Physical activity Advanced directives List of other physicians Hospitalizations, surgeries, and ER visits in previous 12 months Vitals Screenings to include cognitive, depression, and falls Referrals and appointments  In addition, I have reviewed and discussed with patient certain preventive protocols, quality metrics, and best practice recommendations. A written personalized care plan for preventive services as well as general preventive health recommendations were provided to patient.   Pinky Bright, LPN   1/61/0960   After Visit Summary: (MyChart) Due to this being a telephonic visit, the after visit summary with patients personalized plan was  offered to patient via MyChart   Notes: MAMMOGRAM & BDS ORDERED

## 2023-11-13 ENCOUNTER — Ambulatory Visit (INDEPENDENT_AMBULATORY_CARE_PROVIDER_SITE_OTHER): Admitting: Urology

## 2023-11-13 ENCOUNTER — Other Ambulatory Visit: Payer: Self-pay | Admitting: Psychiatry

## 2023-11-13 ENCOUNTER — Encounter: Payer: Self-pay | Admitting: Urology

## 2023-11-13 VITALS — BP 165/82 | HR 69 | Ht 66.0 in | Wt 163.0 lb

## 2023-11-13 DIAGNOSIS — F4001 Agoraphobia with panic disorder: Secondary | ICD-10-CM

## 2023-11-13 DIAGNOSIS — F3342 Major depressive disorder, recurrent, in full remission: Secondary | ICD-10-CM

## 2023-11-13 DIAGNOSIS — R3129 Other microscopic hematuria: Secondary | ICD-10-CM

## 2023-11-13 DIAGNOSIS — F251 Schizoaffective disorder, depressive type: Secondary | ICD-10-CM

## 2023-11-13 DIAGNOSIS — F411 Generalized anxiety disorder: Secondary | ICD-10-CM

## 2023-11-13 LAB — MICROSCOPIC EXAMINATION

## 2023-11-13 LAB — URINALYSIS, COMPLETE
Bilirubin, UA: NEGATIVE
Glucose, UA: NEGATIVE
Ketones, UA: NEGATIVE
Nitrite, UA: NEGATIVE
Protein,UA: NEGATIVE
RBC, UA: NEGATIVE
Specific Gravity, UA: 1.015 (ref 1.005–1.030)
Urobilinogen, Ur: 1 mg/dL (ref 0.2–1.0)
pH, UA: 6 (ref 5.0–7.5)

## 2023-11-13 NOTE — Progress Notes (Signed)
 11/13/2023 3:05 PM   Jessica Little 05-06-1946 [id number]  Referring provider: Normie Becton, FNP No address on file  Chief Complaint  Patient presents with   Follow-up   Urologic history: 1.  High risk hematuria CTU 04/2021 small renal lesions consistent with cysts Cystoscopy 04/2021 without abnormality  HPI: Jessica Little is a 78 y.o. female presents for annual follow-up of hematuria.   Doing well since last visit No bothersome LUTS Denies dysuria, gross hematuria Denies flank, abdominal or pelvic pain   PMH: Past Medical History:  Diagnosis Date   Anxiety    GERD (gastroesophageal reflux disease)    Hyperlipidemia    Hypertension     Surgical History: Past Surgical History:  Procedure Laterality Date   CATARACT EXTRACTION W/PHACO Left 01/18/2021   Procedure: CATARACT EXTRACTION PHACO AND INTRAOCULAR LENS PLACEMENT (IOC) LEFT 7.40 01:11.7;  Surgeon: Annell Kidney, MD;  Location: [facility];  Service: Ophthalmology;  Laterality: Left;   CATARACT EXTRACTION W/PHACO Right 02/01/2021   Procedure: CATARACT EXTRACTION PHACO AND INTRAOCULAR LENS PLACEMENT (IOC) RIGHT EYHANCE TORIC 8.60 01:09.9 ;  Surgeon: Annell Kidney, MD;  Location: [facility];  Service: Ophthalmology;  Laterality: Right;   CHOLECYSTECTOMY     COLONOSCOPY  01/01/2006   COLONOSCOPY WITH PROPOFOL  N/A 08/23/2015   Procedure: COLONOSCOPY WITH PROPOFOL ;  Surgeon: Marshall Skeeter, MD;  Location: [facility];  Service: Endoscopy;  Laterality: N/A;   heart monitor Right 10/22/2021   HIATAL HERNIA REPAIR  2019    Home Medications:  Allergies as of 11/13/2023   No Known Allergies      Medication List        Accurate as of November 13, 2023  3:05 PM. If you have any questions, ask your nurse or doctor.          acetaminophen  500 MG tablet Commonly known as: TYLENOL  Take 1 tablet (500 mg total) by mouth every 6 (six) hours as needed.   amLODipine  5 MG  tablet Commonly known as: NORVASC  Take 1 tablet (5 mg total) by mouth daily.   amoxicillin -clavulanate 875-125 MG tablet Commonly known as: AUGMENTIN  Take 1 tablet by mouth 2 (two) times daily.   aspirin  EC 81 MG tablet Take 81 mg by mouth daily. Swallow whole.   atenolol  50 MG tablet Commonly known as: TENORMIN  Take 1 tablet (50 mg total) by mouth daily.   atorvastatin  40 MG tablet Commonly known as: LIPITOR Take 1 tablet (40 mg total) by mouth daily.   B-complex with vitamin C tablet Take 1 tablet by mouth daily.   Biotin 10000 MCG Tabs Take by mouth.   Calcium -Magnesium-Zinc 500-250-12.5 MG Tabs Take 1 tablet by mouth daily. Dose unknown   erythromycin  ophthalmic ointment Place 1 Application into the left eye at bedtime. 1 cm thin ribbon   Flaxseed Oil 1000 MG Caps Take by mouth.   lisinopril  20 MG tablet Commonly known as: ZESTRIL  Take 2 tablets (40 mg total) by mouth daily.   nortriptyline  50 MG capsule Commonly known as: PAMELOR  Take 1 capsule (50 mg total) by mouth at bedtime.   omeprazole  40 MG capsule Commonly known as: PRILOSEC Take 1 capsule by mouth twice daily   perphenazine  4 MG tablet Commonly known as: TRILAFON  TAKE 1 TABLET BY MOUTH AT BEDTIME   potassium chloride  10 MEQ tablet Commonly known as: KLOR-CON  Take 1 tablet (10 mEq total) by mouth daily.   VITAMIN D3 PO Take by mouth daily.   vitamin E  1000 UNIT capsule Take 1,000 Units by mouth daily.        Allergies: No Known Allergies  Family History: Family History  Problem Relation Age of Onset   Arrhythmia Mother    Heart attack Father    Stroke Father    Depression Sister    Hypertension Sister    Healthy Brother    Breast cancer Neg Hx     Social History:  reports that she has never smoked. She has never used smokeless tobacco. She reports that she does not drink alcohol and does not use drugs.   Physical Exam: BP (!) 165/82   Pulse 69   Ht 5' 6 (1.676 m)   Wt  163 lb (73.9 kg)   BMI 26.31 kg/m   Constitutional:  Alert and oriented, No acute distress. HEENT: [place] AT Respiratory: Normal respiratory effort, no increased work of breathing. Psychiatric: Normal mood and affect.   Urinalysis Dipstick/microscopy negative   Assessment & Plan:    1. Microhematuria UA today clear She gets annual UAs with nephrology and her PCP Urology follow-up as needed    Geraline Knapp, MD  [facility] [address] [place], [place] [address] [phone]

## 2023-12-09 ENCOUNTER — Ambulatory Visit (INDEPENDENT_AMBULATORY_CARE_PROVIDER_SITE_OTHER): Admitting: Family Medicine

## 2023-12-09 ENCOUNTER — Other Ambulatory Visit: Payer: Self-pay | Admitting: Psychiatry

## 2023-12-09 ENCOUNTER — Encounter: Payer: Self-pay | Admitting: Family Medicine

## 2023-12-09 VITALS — BP 140/72 | HR 62 | Ht 66.0 in | Wt 167.0 lb

## 2023-12-09 DIAGNOSIS — F251 Schizoaffective disorder, depressive type: Secondary | ICD-10-CM

## 2023-12-09 DIAGNOSIS — N1831 Chronic kidney disease, stage 3a: Secondary | ICD-10-CM

## 2023-12-09 DIAGNOSIS — Z13820 Encounter for screening for osteoporosis: Secondary | ICD-10-CM | POA: Diagnosis not present

## 2023-12-09 DIAGNOSIS — I10 Essential (primary) hypertension: Secondary | ICD-10-CM

## 2023-12-09 DIAGNOSIS — E782 Mixed hyperlipidemia: Secondary | ICD-10-CM

## 2023-12-09 DIAGNOSIS — M8580 Other specified disorders of bone density and structure, unspecified site: Secondary | ICD-10-CM

## 2023-12-09 NOTE — Progress Notes (Signed)
 Established Patient Office Visit  Introduced to nurse practitioner role and practice setting.  All questions answered.  Discussed provider/patient relationship and expectations.   Subjective   Patient ID: Jessica Little, female    DOB: 01/17/1946  Age: 78 y.o. MRN: [id number]  Chief Complaint  Patient presents with   Medical Management of Chronic Issues    HTN, HLD    Discussed the use of AI scribe software for clinical note transcription with the patient, who gave verbal consent to proceed.  History of Present Illness Jessica Little is a 78 year old female with hypertension and chronic kidney disease who presents for a follow-up visit.  She has recently consulted with both a nephrologist and a vascular specialist. She reports that her kidney doctor said her chronic kidney disease is stable and that her kidney function has improved a little; her GFR was 42. She is currently taking a small dose of potassium to manage low potassium levels and has discontinued amlodipine  due to side effects. Her current medications include atenolol  50mg  and lisinopril  40mg . She maintains hydration to support kidney health.  She has also seen a urologist, who reported favorable urinalysis results due to hx of recurrent UTIs. She is scheduled to see her psychiatrist later this month for her annual visit of management of schizoaffective disorder. She uses erythromycin  ointment for blocked ducts in her upper eyelid, which has significantly improved the condition.  HTN: SBP readings at home prior to medication 160s-170s/70s; post medication SBP=140s. She states history of experiences lightheadedness upon standing with amlodipine . She monitors her blood pressure before taking her morning medications. She avoids amlodipine  due to dizziness and is cautious about using diuretics due to her kidney condition.  She is not interested in continuing mammogram screenings but is open to osteoporosis screening.        12/09/2023    2:15 PM 11/05/2023    1:52 PM 08/08/2023    1:13 PM  Depression screen PHQ 2/9  Decreased Interest 0 0 0  Down, Depressed, Hopeless 0 0 0  PHQ - 2 Score 0 0 0  Altered sleeping 0 0 0  Tired, decreased energy 0 0 0  Change in appetite 0 0 0  Feeling bad or failure about yourself  0 0 0  Trouble concentrating 0 0 0  Moving slowly or fidgety/restless 0 0 0  Suicidal thoughts  0 0  PHQ-9 Score 0 0 0  Difficult doing work/chores  Not difficult at all        12/09/2023    2:15 PM 08/08/2023    1:13 PM  GAD 7 : Generalized Anxiety Score  Nervous, Anxious, on Edge 0 0  Control/stop worrying 0 0  Worry too much - different things 0 0  Trouble relaxing 0 0  Restless 0 0  Easily annoyed or irritable 0 0  Afraid - awful might happen 0 0  Total GAD 7 Score 0 0    ROS  Negative unless indicated in HPI   Objective:     BP (!) 140/72   Pulse 62   Ht 5' 6 (1.676 m)   Wt 167 lb (75.8 kg)   SpO2 100%   BMI 26.95 kg/m    Physical Exam Constitutional:      General: She is not in acute distress.    Appearance: Normal appearance. She is obese. She is not toxic-appearing or diaphoretic.  HENT:     Head: Normocephalic.     Right Ear: Tympanic  membrane normal.     Left Ear: Tympanic membrane normal.     Nose: Nose normal.     Mouth/Throat:     Mouth: Mucous membranes are moist.     Dentition: Abnormal dentition.     Pharynx: Oropharynx is clear.  Eyes:     Extraocular Movements: Extraocular movements intact.     Pupils: Pupils are equal, round, and reactive to light.  Cardiovascular:     Rate and Rhythm: Normal rate and regular rhythm.     Pulses: Normal pulses.          Radial pulses are 2+ on the right side and 2+ on the left side.       Dorsalis pedis pulses are 2+ on the right side and 2+ on the left side.       Posterior tibial pulses are 2+ on the right side and 2+ on the left side.     Heart sounds: Normal heart sounds. No murmur heard.    No friction  rub. No gallop.  Pulmonary:     Effort: No respiratory distress.     Breath sounds: No stridor. No wheezing, rhonchi or rales.  Chest:     Chest wall: No tenderness.  Musculoskeletal:     Right lower leg: No edema.     Left lower leg: No edema.  Skin:    General: Skin is warm and dry.     Capillary Refill: Capillary refill takes less than 2 seconds.  Neurological:     General: No focal deficit present.     Mental Status: She is alert and oriented to person, place, and time. Mental status is at baseline.  Psychiatric:        Mood and Affect: Mood normal. Affect is flat.        Behavior: Behavior normal.        Thought Content: Thought content normal.        Judgment: Judgment normal.     No results found for any visits on 12/09/23.    The 10-year ASCVD risk score (Arnett DK, et al., 2019) is: 26.9%    Assessment & Plan:  Primary hypertension  Stage 3a chronic kidney disease (HCC)  Mixed hyperlipidemia  Screening for osteoporosis -     DG Bone Density  Schizoaffective disorder, depressive type (HCC)  Osteopenia, unspecified location     Assessment and Plan Assessment & Plan Hypertension Blood pressure slightly elevated during visit, rechecked with manual which was 140/72. Hx of carotid stenosis - stable as of vasc visit on 10/22/23 Hx of orthostatic hypotension and syncopal events Pt sensitive and labile to medication changes; close monitor for need for adjustment GOAL<140/80- given age and hx - Continue lisinopril  40 mg daily. - Continue atenolol  50 mg daily. - Monitor blood pressure at home. - Follow up if home blood pressure readings exceed 140 mmHg for medication administration.  -Denies headaches, vision changes, urination changes, chest pain.  Chronic Kidney Disease GFR is 42, slight improvement with hydration. - Follow up with nephrologist in six months. - Continue current potassium supplementation. - Maintain hydration.  HLD  - Continue  atorvastatin  40mg  dail  - lipid  panel UTD - controlled.   Blocked Tear Duct Condition improved with erythromycin  ointment, bump almost gone. - Continue erythromycin  ointment as needed.  Osteopenia Due for repeat dexa scan - continue vitamin D and Calcium  supplements  Schizoaffective Disorder -continue mgmt by Dr. Geoffry - Perhenazine 4 mg daily - Nortriptyline  50 mg nightly  General Health Maintenance Opted out of mammogram screenings.  Interested in osteoporosis screening. - Order DEXA scan.    Return in about 5 months (around 05/10/2024), or HTN.   I, Curtis DELENA Boom, FNP, have reviewed all documentation for this visit. The documentation on 12/09/23 for the exam, diagnosis, procedures, and orders are all accurate and complete.   Curtis DELENA Boom, FNP

## 2023-12-23 ENCOUNTER — Ambulatory Visit: Admitting: Psychiatry

## 2024-01-13 ENCOUNTER — Other Ambulatory Visit: Payer: Self-pay | Admitting: Psychiatry

## 2024-01-13 DIAGNOSIS — F251 Schizoaffective disorder, depressive type: Secondary | ICD-10-CM

## 2024-01-17 ENCOUNTER — Telehealth: Payer: Self-pay

## 2024-01-17 NOTE — Telephone Encounter (Signed)
 PA approved with [employer] [place] [employer] effective 01/17/24-01/16/25

## 2024-01-17 NOTE — Telephone Encounter (Signed)
 Received a PA request for Perphenazine  4 mg tabs #90/30 day, pt has not been seen since 10/2022 Will try to send that office note, PA is due to her age.

## 2024-01-30 ENCOUNTER — Telehealth: Payer: Self-pay

## 2024-01-30 NOTE — Telephone Encounter (Signed)
 Copied from CRM [id number]. Topic: General - Other >> Jan 30, 2024 11:36 AM Antwanette L wrote: Reason for CRM: Nia from [employer] Dept is needing an update on the patient overall health and diagnoses. Nia is requesting a callback at [phone].

## 2024-01-31 NOTE — Telephone Encounter (Signed)
 Attempted phone call to Nia,  Social Service Dept. Left VM to callback [facility] office at [phone].

## 2024-01-31 NOTE — Telephone Encounter (Unsigned)
 Copied from CRM [id number]. Topic: General - Other >> Jan 30, 2024 11:36 AM Antwanette L wrote: Reason for CRM: Nia from [employer] Dept is needing an update on the patient overall health and diagnoses. Nia is requesting a callback at [phone]. >> Jan 31, 2024 12:37 PM DeAngela L wrote: Nia returned call for the office but the office was on lunch and she would like for the office to call her back today   [phone] Nia phone num

## 2024-02-03 ENCOUNTER — Telehealth: Payer: Self-pay | Admitting: Psychiatry

## 2024-02-03 NOTE — Telephone Encounter (Signed)
 Called and spoke with Nia, [place] [facility] Rep. Updated her on patient based on her visit in July. There were no concerns from a behavioral stand point during visit. Pt's medical needs are treated at [facility] and Dr. Geoffry is her psychiatrist. All questions answered.

## 2024-02-03 NOTE — Telephone Encounter (Signed)
 Please see message. She was last seen 10/2022. Appt 6/25 rescheduled and July appt was no show/cancel.  Meds are perphenazine  and nortriptyline . Perphenazine  RF in August, nortriptyline  filled 6/18 for 90 days.

## 2024-02-03 NOTE — Telephone Encounter (Signed)
 RTC Nia at [facility] Adult protective services and gave clinical info.  No acute concerns but she has an appt next month.
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
## 2024-02-05 ENCOUNTER — Other Ambulatory Visit: Payer: Self-pay | Admitting: Family Medicine

## 2024-02-05 DIAGNOSIS — I10 Essential (primary) hypertension: Secondary | ICD-10-CM

## 2024-02-06 ENCOUNTER — Telehealth: Payer: Self-pay

## 2024-02-06 NOTE — Telephone Encounter (Signed)
 CRM sent to [facility] corrections

## 2024-02-06 NOTE — Telephone Encounter (Signed)
 Copied from CRM [id number]. Topic: General - Other >> Feb 06, 2024  1:44 PM Delon DASEN wrote: Reason for CRM: returning call to Kaitlyn- [phone]

## 2024-02-15 ENCOUNTER — Other Ambulatory Visit: Payer: Self-pay | Admitting: Psychiatry

## 2024-02-15 DIAGNOSIS — F251 Schizoaffective disorder, depressive type: Secondary | ICD-10-CM

## 2024-02-15 DIAGNOSIS — F411 Generalized anxiety disorder: Secondary | ICD-10-CM

## 2024-02-15 DIAGNOSIS — F4001 Agoraphobia with panic disorder: Secondary | ICD-10-CM

## 2024-02-15 DIAGNOSIS — F3342 Major depressive disorder, recurrent, in full remission: Secondary | ICD-10-CM

## 2024-02-18 NOTE — Progress Notes (Signed)
 IDELL HISSONG                                          MRN: [id number]   02/18/2024   The [facility]BCI Quality Team Specialist reviewed this patient medical record for the purposes of chart review for care gap closure. The following were reviewed: chart review for care gap closure-controlling blood pressure.    VBCI Quality Team

## 2024-02-27 ENCOUNTER — Encounter: Payer: Self-pay | Admitting: Psychiatry

## 2024-02-27 ENCOUNTER — Ambulatory Visit: Admitting: Psychiatry

## 2024-02-27 DIAGNOSIS — F3342 Major depressive disorder, recurrent, in full remission: Secondary | ICD-10-CM

## 2024-02-27 DIAGNOSIS — F251 Schizoaffective disorder, depressive type: Secondary | ICD-10-CM

## 2024-02-27 DIAGNOSIS — F411 Generalized anxiety disorder: Secondary | ICD-10-CM | POA: Diagnosis not present

## 2024-02-27 DIAGNOSIS — F4001 Agoraphobia with panic disorder: Secondary | ICD-10-CM

## 2024-02-27 MED ORDER — PERPHENAZINE 4 MG PO TABS: 4.0000 mg | ORAL_TABLET | Freq: Every day | ORAL | 3 refills | Status: AC

## 2024-02-27 MED ORDER — NORTRIPTYLINE HCL 50 MG PO CAPS: 50.0000 mg | ORAL_CAPSULE | Freq: Every day | ORAL | 3 refills | Status: AC

## 2024-02-27 NOTE — Progress Notes (Signed)
 Jessica Little [id number] January 15, 1946 78 y.o.  Subjective:   Patient ID:  Jessica Little is a 78 y.o. (DOB [birth year]) female.  Chief Complaint:  Chief Complaint  Patient presents with   Follow-up   Depression   Anxiety    HPI Jessica Little presents to the office today for follow-up of depression with psychotic features.  Hosp April 2019 for 4 weeks after hernia surgery and then had agitated delirium.  Per Dr. Euel psychiatry at [facility] she also had a prolonged QTC of 593.  She was discharged from the hospital on nortriptyline  50 and perphenazine  4 mg nightly and lorazepam 1 mg twice daily .   then rehab.  A lot of med changes. Came off lorazepam in the hospital but doesn't remember it.  No psychosis off the meds.  Weaned off perphenazine  too and on less nortriptyline  and doing ok.  seen January 2020 and 06/22/19.  No meds were changed.  Has remained on nortriptyline  50 HS and perphenazine  4 HS. She was doing well at the time.  07/04/2020 appointment with the following noted: Sister came to see her in August and thinking about moving back to [place] to be with sisters and brother.  Maybe in the summer.    Doing great.  God's healed me of panic.  No depression.  Patient reports stable mood and denies depressed or irritable moods.  Patient denies any recent difficulty with anxiety.  Patient reports variable difficulty with sleep initiation or maintenance. No trazodone  anymore   Denies appetite disturbance.  Patient reports that energy and motivation have been good.  Patient denies any difficulty with concentration.  Patient denies any suicidal ideation. Plan: Doing well with only perphenazine  4 HS Nortriptyline  50 HS. Off lorazepam for a couple of years No med changes indicated. High relapse risk without it.  10/16/2021 appointment with the following noted: Had planned to move back to [place] but hasn't yet. Orthostatic hypotension by her report and has been seen recently. depression  and anxiety are not a problem at all.  Patient reports stable mood and denies depressed or irritable moods.  Patient denies any recent difficulty with anxiety.  . Denies appetite disturbance.  Patient reports that energy and motivation have been good.  Patient denies any difficulty with concentration.  Patient denies any suicidal ideation. Occ EMA several days per week.  No sig napping usually unless it's brief.  Most of the time is fine. No problems with psych meds. On waiting list for [facility] in [place] . Plan: Doing well with only perphenazine  4 HS Nortriptyline  50 HS. 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  But not likely the cause. Off lorazepam for a couple of years  10/29/22 appt noted: Doing well.  Drove herself here alone.  No further fainting episodes in the last year. Work up without evidence for SZ.  Carotid stenosis is felt manageable.  Implanted heart monitor. Overall I think I'm doing great.  Was low K. Taking it and no further problems. No panic.  Patient reports stable mood and denies depressed or irritable moods.  Patient denies any recent difficulty with anxiety.  Patient denies difficulty with sleep initiation or maintenance. Denies appetite disturbance.  Patient reports that energy and motivation have been good.  Patient denies any difficulty with concentration.  Patient denies any suicidal ideation. No fear nor paranoia nor hallucinations like in the past. Still misses her H.  He died 05/20/05.  Oldest sister died 2023-08-19.    03/16/24  appt noted: Med: nortriptyline  50 mg HS, perphenazine  4 mg HS Doing well.  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 Some $ stress.  Trying to pay of credit cards.  Trying to be positive.  Was getting taking advantage of financially but stopped that. Sleep good.  No panic.  Not depressed.   Good friends to help her out.   Good memory Widowed 19 years.  Misses him. Paid off house  and car.    Past Psychiatric Medication Trials: Nortriptyline  100,  perphenazine  16 mg daily but mostly 8 mg over the last 10 years,  Geodon 160, olanzapine 20, quetiapine 300, Haldol 15 lorazepam 1 mg 4 times daily, atenolol ,  Under the care of [facility] since July 1996  Review of Systems:  Review of Systems  Respiratory:  Negative for chest tightness.   Gastrointestinal:  Negative for abdominal distention.  Musculoskeletal:  Positive for gait problem.  Neurological:  Positive for tremors and light-headedness.  Psychiatric/Behavioral:  Negative for agitation, behavioral problems, confusion, decreased concentration, dysphoric mood, hallucinations, self-injury, sleep disturbance and suicidal ideas. The patient is nervous/anxious. The patient is not hyperactive.     Medications: I have reviewed the patient's current medications.  Current Outpatient Medications  Medication Sig Dispense Refill   acetaminophen  (TYLENOL ) 500 MG tablet Take 1 tablet (500 mg total) by mouth every 6 (six) hours as needed.     aspirin  EC 81 MG tablet Take 81 mg by mouth daily. Swallow whole.     atenolol  (TENORMIN ) 50 MG tablet Take 1 tablet (50 mg total) by mouth daily. 90 tablet 1   atorvastatin  (LIPITOR) 40 MG tablet Take 1 tablet (40 mg total) by mouth daily. 90 tablet 3   B Complex-C (B-COMPLEX WITH VITAMIN C) tablet Take 1 tablet by mouth daily.     Biotin 89999 MCG TABS Take by mouth.     Calcium -Magnesium-Zinc 500-250-12.5 MG TABS Take 1 tablet by mouth daily. Dose unknown     Cholecalciferol (VITAMIN D3 PO) Take by mouth daily.     erythromycin  ophthalmic ointment Place 1 Application into the left eye at bedtime. 1 cm thin ribbon 3.5 g 0   Flaxseed, Linseed, (FLAXSEED OIL) 1000 MG CAPS Take by mouth.     lisinopril  (ZESTRIL ) 20 MG tablet Take 2 tablets by mouth once daily 180 tablet 1   nortriptyline  (PAMELOR ) 50 MG capsule Take 1 capsule by mouth at bedtime 30 capsule 0    omeprazole  (PRILOSEC) 40 MG capsule Take 1 capsule by mouth twice daily 180 capsule 1   perphenazine  (TRILAFON ) 4 MG tablet TAKE 1 TABLET BY MOUTH AT BEDTIME 90 tablet 0   potassium chloride  (KLOR-CON ) 10 MEQ tablet Take 1 tablet (10 mEq total) by mouth daily. 90 tablet 1   vitamin E 1000 UNIT capsule Take 1,000 Units by mouth daily.     No current facility-administered medications for this visit.    Medication Side Effects: none  Allergies: No Known Allergies  Past Medical History:  Diagnosis Date   Anxiety    GERD (gastroesophageal reflux disease)    Hyperlipidemia    Hypertension     Family History  Problem Relation Age of Onset   Arrhythmia Mother    Heart attack Father    Stroke Father    Depression Sister    Hypertension Sister    Healthy Brother    Breast cancer Neg Hx     Social  History   Socioeconomic History   Marital status: Widowed    Spouse name: Not on file   Number of children: 0   Years of education: College   Highest education level: Bachelor's degree (e.g., BA, AB, BS)  Occupational History   Occupation: Retired  Tobacco Use   Smoking status: Never   Smokeless tobacco: Never  Vaping Use   Vaping status: Never Used  Substance and Sexual Activity   Alcohol use: No   Drug use: No   Sexual activity: Not on file  Other Topics Concern   Not on file  Social History Narrative   Right handed   No caffeine   One story home   Social Drivers of Health   Financial Resource Strain: Low Risk  (11/05/2023)   Overall Financial Resource Strain (CARDIA)    Difficulty of Paying Living Expenses: Not very hard  Food Insecurity: No Food Insecurity (11/05/2023)   Hunger Vital Sign    Worried About Running Out of Food in the Last Year: Never true    Ran Out of Food in the Last Year: Never true  Transportation Needs: No Transportation Needs (11/05/2023)   PRAPARE - Administrator, Civil Service (Medical): No    Lack of Transportation (Non-Medical):  No  Physical Activity: Insufficiently Active (11/05/2023)   Exercise Vital Sign    Days of Exercise per Week: 3 days    Minutes of Exercise per Session: 30 min  Stress: No Stress Concern Present (11/05/2023)   [employer] of Occupational Health - Occupational Stress Questionnaire    Feeling of Stress : Only a little  Social Connections: Moderately Isolated (11/05/2023)   Social Connection and Isolation Panel    Frequency of Communication with Friends and Family: More than three times a week    Frequency of Social Gatherings with Friends and Family: Twice a week    Attends Religious Services: More than 4 times per year    Active Member of [employer] or Organizations: No    Attends Banker Meetings: Never    Marital Status: Widowed  Intimate Partner Violence: Not At Risk (11/05/2023)   Humiliation, Afraid, Rape, and Kick questionnaire    Fear of Current or Ex-Partner: No    Emotionally Abused: No    Physically Abused: No    Sexually Abused: No    Past Medical History, Surgical history, Social history, and Family history were reviewed and updated as appropriate.   Please see review of systems for further details on the patient's review from today.   Objective:   Physical Exam:  There were no vitals taken for this visit.  Physical Exam Constitutional:      General: She is not in acute distress.    Appearance: She is well-developed.  Musculoskeletal:        General: No deformity.  Neurological:     Mental Status: She is alert and oriented to person, place, and time.     Cranial Nerves: Dysarthria present.     Motor: No tremor.     Coordination: Coordination normal.     Gait: Gait normal.     Comments: Slight dysarthria chronically.  Psychiatric:        Attention and Perception: She is attentive. She does not perceive auditory hallucinations.        Mood and Affect: Mood is not anxious or depressed. Affect is not labile, blunt, angry or inappropriate.         Speech: Speech normal.  Behavior: Behavior normal.        Thought Content: Thought content normal. Thought content is not paranoid or delusional. Thought content does not include homicidal or suicidal ideation. Thought content does not include suicidal plan.        Cognition and Memory: Cognition normal. Cognition is not impaired. Memory is not impaired.        Judgment: Judgment normal.     Comments: Insight fair. No auditory or visual hallucinations. No delusions.  Sx well controlled. Euthymic Good cognition.       Lab Review:     Component Value Date/Time   NA 142 07/30/2022 1502   NA 144 04/30/2014 1321   K 3.7 07/30/2022 1502   K 3.3 (L) 04/30/2014 1321   CL 103 07/30/2022 1502   CL 102 04/30/2014 1321   CO2 25 07/30/2022 1502   CO2 28 04/30/2014 1321   GLUCOSE 103 (H) 07/30/2022 1502   GLUCOSE 114 (H) 09/24/2021 0436   GLUCOSE 120 (H) 04/30/2014 1321   BUN 21 07/30/2022 1502   BUN 37 (H) 04/30/2014 1321   CREATININE 1.17 (H) 07/30/2022 1502   CREATININE 1.75 (H) 04/30/2014 1321   CALCIUM  9.2 07/30/2022 1502   CALCIUM  8.8 04/30/2014 1321   PROT 6.5 07/30/2022 1502   PROT 7.6 04/30/2014 1321   ALBUMIN 4.3 07/30/2022 1502   ALBUMIN 3.9 04/30/2014 1321   AST 26 07/30/2022 1502   AST 32 04/30/2014 1321   ALT 25 07/30/2022 1502   ALT 27 04/30/2014 1321   ALKPHOS 88 07/30/2022 1502   ALKPHOS 73 04/30/2014 1321   BILITOT 0.4 07/30/2022 1502   BILITOT 0.6 04/30/2014 1321   GFRNONAA 43 (L) 09/24/2021 0436   GFRNONAA 31 (L) 04/30/2014 1321   GFRNONAA 26 (L) 02/01/2014 1705   GFRAA 46 (L) 05/23/2020 1434   GFRAA 37 (L) 04/30/2014 1321   GFRAA 30 (L) 02/01/2014 1705       Component Value Date/Time   WBC 5.7 07/30/2022 1502   WBC 7.5 09/24/2021 0436   RBC 4.92 07/30/2022 1502   RBC 4.61 09/24/2021 0436   HGB 12.9 07/30/2022 1502   HCT 40.2 07/30/2022 1502   PLT 254 07/30/2022 1502   MCV 82 07/30/2022 1502   MCV 81 04/30/2014 1340   MCH 26.2 (L) 07/30/2022  1502   MCH 25.8 (L) 09/24/2021 0436   MCHC 32.1 07/30/2022 1502   MCHC 31.3 09/24/2021 0436   RDW 15.2 07/30/2022 1502   RDW 16.7 (H) 04/30/2014 1340   LYMPHSABS 2.1 07/30/2022 1502   LYMPHSABS 1.8 04/30/2014 1340   MONOABS 1.3 (H) 04/30/2014 1340   EOSABS 0.1 07/30/2022 1502   EOSABS 0.0 04/30/2014 1340   BASOSABS 0.0 07/30/2022 1502   BASOSABS 0.1 04/30/2014 1340    No results found for: POCLITH, LITHIUM   10/20/2021 nortriptyline  level 96 on 50 mg daily.  .res Assessment: Plan:    Schizoaffective disorder, depressive type (HCC)  Panic disorder with agoraphobia  Generalized anxiety disorder   30 min face to face time with patient was spent . We discussed Patient has a history of schizoaffective disorder generalized anxiety disorder and panic disorder and had been stable for many years.  She had a medical hospitalization for surgery and developed psychotic delirium in April 2019.  During that time in her recovery her nortriptyline  dose was reduced by 50% to 50 mg daily, her perphenazine  was discontinued and she was weaned off lorazepam.  She had no occasions of relapse of psychotic  symptoms since being off the perphenazine  so we will leave her off that.  Her depression has been stable with a lower dose no of nortriptyline  so we will would not renew that.  Her panic disorder and has not recurred so we will not restart any benzodiazepine.  She will let us  know if she has any recurrence of symptoms.  She has had repeated syncopal episodes.  From reviewing the notes it does not appear to be related to any cardiac conduction delay problems and therefore unlikely related to nortriptyline  or any other psychiatric medicine.  I have reviewed the cardiology notes and cardiology is not suggesting any concerns about psychiatric medicines either. Reports it was due to orthostatic hypotension.  Encourage fluids.  She does. Problem has resolved.    Doing well with only perphenazine  4  HS  Nortriptyline  50 HS. 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 10/20/2021 nortriptyline  level 96 on 50 mg daily.  But not likely the cause. Off lorazepam for a couple of years  No med changes indicated. High relapse risk without it. Doses are low.  Discussed potential metabolic side effects associated with atypical antipsychotics, as well as potential risk for movement side effects. Advised pt to contact office if movement side effects occur.   Call if any relapses.    Follow-up 12 months  Lorene Macintosh, MD, DFAPA  Please see After Visit Summary for patient specific instructions.  Future Appointments  Date Time Provider Department Center  04/28/2024  2:20 PM Ostwalt, Janna, [place] [facility]  10/21/2024  1:00 PM [facility] 1 [facility] None  10/21/2024  2:00 PM Delores Orvin BRAVO, NP [facility] None  11/17/2024 10:10 AM [facility]-ANNUAL WELLNESS VISIT [facility]    No orders of the defined types were placed in this encounter.     -------------------------------

## 2024-04-01 ENCOUNTER — Other Ambulatory Visit: Payer: Self-pay | Admitting: Family Medicine

## 2024-04-01 DIAGNOSIS — I10 Essential (primary) hypertension: Secondary | ICD-10-CM

## 2024-04-04 ENCOUNTER — Other Ambulatory Visit: Payer: Self-pay

## 2024-04-04 ENCOUNTER — Emergency Department: Admission: EM | Admit: 2024-04-04 | Discharge: 2024-04-04 | Disposition: A | Discharge status: Home / Self Care

## 2024-04-04 ENCOUNTER — Emergency Department

## 2024-04-04 DIAGNOSIS — W19XXXA Unspecified fall, initial encounter: Secondary | ICD-10-CM | POA: Diagnosis not present

## 2024-04-04 DIAGNOSIS — Y92481 Parking lot as the place of occurrence of the external cause: Secondary | ICD-10-CM | POA: Insufficient documentation

## 2024-04-04 DIAGNOSIS — S0990XA Unspecified injury of head, initial encounter: Secondary | ICD-10-CM | POA: Diagnosis not present

## 2024-04-04 DIAGNOSIS — S199XXA Unspecified injury of neck, initial encounter: Secondary | ICD-10-CM | POA: Diagnosis not present

## 2024-04-04 DIAGNOSIS — S0181XA Laceration without foreign body of other part of head, initial encounter: Secondary | ICD-10-CM | POA: Insufficient documentation

## 2024-04-04 DIAGNOSIS — Y9301 Activity, walking, marching and hiking: Secondary | ICD-10-CM | POA: Insufficient documentation

## 2024-04-04 DIAGNOSIS — S0083XA Contusion of other part of head, initial encounter: Secondary | ICD-10-CM

## 2024-04-04 DIAGNOSIS — Z23 Encounter for immunization: Secondary | ICD-10-CM | POA: Insufficient documentation

## 2024-04-04 DIAGNOSIS — I129 Hypertensive chronic kidney disease with stage 1 through stage 4 chronic kidney disease, or unspecified chronic kidney disease: Secondary | ICD-10-CM | POA: Diagnosis not present

## 2024-04-04 DIAGNOSIS — R9082 White matter disease, unspecified: Secondary | ICD-10-CM | POA: Diagnosis not present

## 2024-04-04 DIAGNOSIS — N189 Chronic kidney disease, unspecified: Secondary | ICD-10-CM | POA: Diagnosis not present

## 2024-04-04 MED ORDER — LIDOCAINE-EPINEPHRINE 2 %-1:100000 IJ SOLN
20.0000 mL | Freq: Once | INTRAMUSCULAR | Status: AC
  Administered 2024-04-04: 20 mL via INTRADERMAL
  Filled 2024-04-04: qty 1

## 2024-04-04 MED ORDER — TETANUS-DIPHTH-ACELL PERTUSSIS 5-2-15.5 LF-MCG/0.5 IM SUSP
0.5000 mL | Freq: Once | INTRAMUSCULAR | Status: AC
  Administered 2024-04-04: 0.5 mL via INTRAMUSCULAR
  Filled 2024-04-04: qty 0.5

## 2024-04-04 NOTE — ED Notes (Signed)
 First nurse note: To ED from pharmacy parking lot for witnessed mechanical fall, 3 inch lac to R eyebrow, bldg controlled. Bandaged. No thinners. Denies dizziness.   181/110, HR 66, 97% RA. Alert and oriented
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
## 2024-04-04 NOTE — Discharge Instructions (Addendum)
 You was seen in the emergency department after a trip and fall.  Your tetanus shot was updated.  A CT scan of your head demonstrated no intracranial hemorrhage and you had no cervical fracture.  You did have a laceration and this was cleaned out and repaired with nylon resolvable sutures.  These will need to be removed within 6 to 9 days.  Any primary care physician or urgent care facility can do this.  Monitor for signs of infection.  Please return with any acutely worsening symptoms or any other emergency. -- RETURN PRECAUTIONS & AFTERCARE: (ENGLISH) RETURN PRECAUTIONS: Return immediately to the emergency department or see/call your doctor if you feel worse, weak or have changes in speech or vision, are short of breath, have fever, vomiting, pain, bleeding or dark stool, trouble urinating or any new issues. Return here or see/call your doctor if not improving as expected for your suspected condition. FOLLOW-UP CARE: Call your doctor and/or any doctors we referred you to for more advice and to make an appointment. Do this today, tomorrow or after the weekend. Some doctors only take PPO insurance so if you have HMO insurance you may want to contact your HMO or your regular doctor for referral to a specialist within your plan. Either way tell the doctor's office that it was a referral from the emergency department so you get the soonest possible appointment.  YOUR TEST RESULTS: Take result reports of any blood or urine tests, imaging tests and EKG's to your doctor and any referral doctor. Have any abnormal tests repeated. Your doctor or a referral doctor can let you know when this should be done. Also make sure your doctor contacts this hospital to get any test results that are not currently available such as cultures or special tests for infection and final imaging reports, which are often not available at the time you leave the ER but which may list additional important findings that are not documented on the  preliminary report. BLOOD PRESSURE: If your blood pressure was greater than 120/80 have your blood pressure rechecked within 1 to 2 weeks. MEDICATION SIDE EFFECTS: Do not drive, walk, bike, take the bus, etc. if you have received or are being prescribed any sedating medications such as those for pain or anxiety or certain antihistamines like Benadryl. If you have been give one of these here get a taxi home or have a friend drive you home. Ask your pharmacist to counsel you on potential side effects of any new medication

## 2024-04-04 NOTE — ED Provider Notes (Signed)
 [facility] Provider Note    Event Date/Time   First MD Initiated Contact with Patient 04/04/24 1425     (approximate)   History   Fall   HPI  Jessica Little is a 78 y.o. female hypertension, CKD, schizo affective disorder who presents of headache and bleeding from the forehead after a trip and fall.  Patient was exiting her car at a drugstore and while walking did not notice a speed bump and tripped and fell.  She did have a positive head strike and has had bleeding from the forehead.  Denies any loss of consciousness and is not on any blood thinners.  Denies pain in any extremity and has ambulated since this incident.  She does not know when her last Tdap was.  Denies any hearing or vision changes or difficulty swallowing      Physical Exam   Triage Vital Signs: ED Triage Vitals  Encounter Vitals Group     BP 04/04/24 1409 (!) 168/84     Girls Systolic BP Percentile --      Girls Diastolic BP Percentile --      Boys Systolic BP Percentile --      Boys Diastolic BP Percentile --      Pulse Rate 04/04/24 1409 (!) 57     Resp 04/04/24 1409 18     Temp 04/04/24 1409 98.3 F (36.8 C)     Temp Source 04/04/24 1409 Oral     SpO2 04/04/24 1409 98 %     Weight 04/04/24 1407 165 lb (74.8 kg)     Height 04/04/24 1407 5' 6 (1.676 m)     Head Circumference --      Peak Flow --      Pain Score 04/04/24 1407 5     Pain Loc --      Pain Education --      Exclude from Growth Chart --     Most recent vital signs: Vitals:   04/04/24 1409 04/04/24 1605  BP: (!) 168/84 (!) 199/95  Pulse: (!) 57 (!) 57  Resp: 18 18  Temp: 98.3 F (36.8 C)   SpO2: 98% 100%    Nursing Triage Note reviewed. Vital signs reviewed and patients oxygen saturation is normoxic  General: Patient is well nourished, well developed, awake and alert, resting comfortably in no acute distress Head: Normocephalic, over right eyebrow there is a linear laceration measuring 5 cm with  gentle oozing but no penetration of the frontalis Eyes: Normal inspection, extraocular muscles intact, no conjunctival pallor, there is a right supraorbital ecchymosis but no tenderness around the orbital rim Ear, nose, throat: Normal external exam Neck: Normal range of motion no C-spine tenderness Respiratory: Patient is in no respiratory distress, lungs CTAB Cardiovascular: Patient is not tachycardic, RRR without murmur appreciated GI: Abd SNT with no guarding or rebound  Back: Normal inspection of the back with good strength and range of motion throughout all ext No T or L-spine tenderness to palpation Extremities: pulses intact with good cap refills, no LE pitting edema or calf tenderness Neuro: The patient is alert and oriented to person, place, and time, appropriately conversive, with 5/5 bilat UE/LE strength, no gross motor or sensory defects noted. Coordination appears to be adequate. Psych: normal mood and affect, no SI or HI  ED Results / Procedures / Treatments   Labs (all labs ordered are listed, but only abnormal results are displayed) Labs Reviewed - No data to display  EKG   RADIOLOGY CT head: No intracranial hemorrhage on my independent review interpretation radiologist agrees CT C-spine: No cervical spine fracture    PROCEDURES:  Critical Care performed: No  .Laceration Repair  Date/Time: 04/04/2024 6:36 PM  Performed by: Nicholaus Rolland BRAVO, MD Authorized by: Nicholaus Rolland BRAVO, MD   Consent:    Consent obtained:  Verbal   Consent given by:  Patient   Risks, benefits, and alternatives were discussed: yes     Risks discussed:  Infection, pain, poor cosmetic result, need for additional repair and nerve damage Universal protocol:    Procedure explained and questions answered to patient or proxy's satisfaction: yes     Patient identity confirmed:  Verbally with patient and arm band Anesthesia:    Anesthesia method:  Local infiltration   Local anesthetic:   Lidocaine  2% WITH epi Laceration details:    Location:  Face   Face location:  Forehead   Length (cm):  5   Depth (mm):  2 Pre-procedure details:    Preparation:  Patient was prepped and draped in usual sterile fashion and imaging obtained to evaluate for foreign bodies Exploration:    Hemostasis achieved with:  Direct pressure   Imaging outcome: foreign body not noted     Wound exploration: entire depth of wound visualized     Wound extent: no foreign body, no nerve damage and no tendon damage     Contaminated: no   Treatment:    Area cleansed with:  Povidone-iodine and saline   Amount of cleaning:  Standard   Irrigation volume:  100   Irrigation method:  Pressure wash   Visualized foreign bodies/material removed: no     Debridement:  None   Undermining:  None   Scar revision: no   Skin repair:    Repair method:  Sutures   Suture size:  4-0   Suture material:  Prolene   Number of sutures:  5 Approximation:    Approximation:  Close Repair type:    Repair type:  Intermediate Post-procedure details:    Dressing:  Antibiotic ointment   Procedure completion:  Tolerated well, no immediate complications Comments:     Sutures performed by APP student with myself at bedside observing.  I was active and present throughout the procedure    MEDICATIONS ORDERED IN ED: Medications  Tdap (ADACEL) injection 0.5 mL (0.5 mLs Intramuscular Given 04/04/24 1447)  lidocaine -EPINEPHrine  (XYLOCAINE  W/EPI) 2 %-1:100000 (with pres) injection 20 mL (20 mLs Intradermal Given by Other 04/04/24 1448)     IMPRESSION / MDM / ASSESSMENT AND PLAN / ED COURSE                                Differential diagnosis includes, but is not limited to, intracranial hemorrhage, head contusion/concussion, cervical spine fracture, laceration, need for Tdap   ED course: Patient arrives without any focal neurological deficits.  Given patient's age and evidence of head trauma, CT head was obtained to evaluate  for any intracranial hemorrhage which demonstrated none.  CT C-spine was unremarkable.  Patient was administered a Tdap.  Laceration was repaired.  I did consider obtaining a CT face however exam does not seem consistent with any facial fractures.  Given that this was mechanical and secondary to patient missing a speed bump, I do not think that further blood work or urinalysis is indicated.  Patient's friend at bedside and states that patient is at  her baseline mental status.  She will follow-up with her primary care physician this week for wound recheck and suture removal.  She was counseled to look for warning signs of infection.  She will return if any acutely worsening symptoms.  At time of discharge there is no evidence of acute life, limb, vision, or fertility threat. Patient has stable vital signs, pain is well controlled, patient is ambulatory and p.o. tolerant.  Discharge instructions were completed using the EPIC system. I would refer you to those at this time. All warnings prescriptions follow-up etc. were discussed in detail with the patient. Patient indicates understanding and is agreeable with this plan. All questions answered.  Patient is made aware that they may return to the emergency department for any worsening or new condition or for any other emergency.      -- Risk: 5 This patient has a high risk of morbidity due to further diagnostic testing or treatment. Rationale: This patient's evaluation and management involve a high risk of morbidity due to the potential severity of presenting symptoms, need for diagnostic testing, and/or initiation of treatment that may require close monitoring. The differential includes conditions with potential for significant deterioration or requiring escalation of care. Treatment decisions in the ED, including medication administration, procedural interventions, or disposition planning, reflect this level of risk. COPA: 5 The patient has the following  acute or chronic illness/injury that poses a possible threat to life or bodily function: [X] : The patient has a potentially serious acute condition or an acute exacerbation of a chronic illness requiring urgent evaluation and management in the Emergency Department. The clinical presentation necessitates immediate consideration of life-threatening or function-threatening diagnoses, even if they are ultimately ruled out.   FINAL CLINICAL IMPRESSION(S) / ED DIAGNOSES   Final diagnoses:  Fall, initial encounter  Facial laceration, initial encounter  Contusion of other part of head, initial encounter     Rx / DC Orders   ED Discharge Orders     None        Note:  This document was prepared using Dragon voice recognition software and may include unintentional dictation errors.   Nicholaus Rolland BRAVO, MD 04/04/24 [phone]

## 2024-04-06 ENCOUNTER — Ambulatory Visit: Payer: Self-pay

## 2024-04-06 ENCOUNTER — Other Ambulatory Visit: Payer: Self-pay | Admitting: Family Medicine

## 2024-04-06 DIAGNOSIS — I10 Essential (primary) hypertension: Secondary | ICD-10-CM

## 2024-04-06 NOTE — Telephone Encounter (Signed)
 FYI Only or Action Required?: FYI only for provider: appointment scheduled on 11.14.25.  Patient was last seen in primary care on 12/09/2023 by Wellington Curtis LABOR, FNP.  Called Nurse Triage reporting Fall.  Symptoms began several days ago.  Interventions attempted: Other: stitches.  Symptoms are: stable.  Triage Disposition: See PCP Within 2 Weeks  Patient/caregiver understands and will follow disposition?: Yes     Copied from CRM [id number]. Topic: Clinical - Red Word Triage >> Apr 06, 2024  2:56 PM Winona R wrote: Pt fell on Saturday and has some facial bruising that has spread down her face which is accompanied by swelling, Also need to schedule a follow up with NP clifton for stiches removal confirmed a regular appointment is ok for this visit Reason for Disposition  [1] Fall AND [2] went to emergency department for evaluation or treatment  Answer Assessment - Initial Assessment Questions Clemens on Saturday and went to the ER. Has 5 stitches above R eye. Needs those removed later this week, also states she has some swelling in her face. Rn gave education on how to help swelling before stitches are removed and when to call back.     1. MECHANISM: How did the fall happen?     Tripped  2. DOMESTIC VIOLENCE AND ELDER ABUSE SCREENING: Did you fall because someone pushed you or tried to hurt you? If Yes, ask: Are you safe now?     no 3. ONSET: When did the fall happen? (e.g., minutes, hours, or days ago)     Saturday 4. LOCATION: What part of the body hit the ground? (e.g., back, buttocks, head, hips, knees, hands, head, stomach)     Head/face, above right eye 5. INJURY: Did you hurt (injure) yourself when you fell? If Yes, ask: What did you injure? Tell me more about this? (e.g., body area; type of injury; pain severity)     Yes, right eye needed sutures 6. PAIN: Is there any pain? If Yes, ask: How bad is the pain? (e.g., Scale 0-10; or none, mild,      States no  pain currently  9. OTHER SYMPTOMS: Do you have any other symptoms? (e.g., dizziness, fever, weakness; new-onset or worsening).      Swelling 10. CAUSE: What do you think caused the fall (or falling)? (e.g., dizzy spell, tripped)       Tripped over a  Protocols used: Falls and [facility]

## 2024-04-07 DIAGNOSIS — S0511XA Contusion of eyeball and orbital tissues, right eye, initial encounter: Secondary | ICD-10-CM | POA: Diagnosis not present

## 2024-04-07 DIAGNOSIS — H26492 Other secondary cataract, left eye: Secondary | ICD-10-CM | POA: Diagnosis not present

## 2024-04-07 DIAGNOSIS — H353131 Nonexudative age-related macular degeneration, bilateral, early dry stage: Secondary | ICD-10-CM | POA: Diagnosis not present

## 2024-04-07 DIAGNOSIS — Z961 Presence of intraocular lens: Secondary | ICD-10-CM | POA: Diagnosis not present

## 2024-04-10 ENCOUNTER — Encounter: Payer: Self-pay | Admitting: Family Medicine

## 2024-04-10 ENCOUNTER — Ambulatory Visit (INDEPENDENT_AMBULATORY_CARE_PROVIDER_SITE_OTHER): Admitting: Family Medicine

## 2024-04-10 VITALS — BP 148/91 | HR 66 | Ht 66.0 in | Wt 163.9 lb

## 2024-04-10 DIAGNOSIS — W109XXD Fall (on) (from) unspecified stairs and steps, subsequent encounter: Secondary | ICD-10-CM | POA: Diagnosis not present

## 2024-04-10 DIAGNOSIS — S01111D Laceration without foreign body of right eyelid and periocular area, subsequent encounter: Secondary | ICD-10-CM | POA: Diagnosis not present

## 2024-04-10 DIAGNOSIS — Z4802 Encounter for removal of sutures: Secondary | ICD-10-CM | POA: Diagnosis not present

## 2024-04-10 DIAGNOSIS — W109XXS Fall (on) (from) unspecified stairs and steps, sequela: Secondary | ICD-10-CM

## 2024-04-10 DIAGNOSIS — S01111S Laceration without foreign body of right eyelid and periocular area, sequela: Secondary | ICD-10-CM

## 2024-04-10 NOTE — Progress Notes (Signed)
 Established Patient Office Visit  Introduced to nurse practitioner role and practice setting.  All questions answered.  Discussed provider/patient relationship and expectations.  Subjective   Patient ID: Jessica Little, female    DOB: 10/08/1945  Age: 78 y.o. MRN: [id number]  Chief Complaint  Patient presents with   Hospitalization Follow-up    Patient is here for a hospital follow up to get her stitches removed above her right eye.    Discussed the use of AI scribe software for clinical note transcription with the patient, who gave verbal consent to proceed.  History of Present Illness Jessica Little is a 78 year old female who presents for suture removal following a facial laceration.  She sustained a facial laceration when she fell while tripping over a speed bump while walking and her sunglasses cut her face, though they protected her eye. She has been applying ice packs to the area, which has been improving. No vision changes, and a recent eye examination confirmed no eye injury with opthalmology. She has no other injuries to her arms or legs.      04/10/2024    2:04 PM 12/09/2023    2:15 PM 11/05/2023    1:52 PM  Depression screen PHQ 2/9  Decreased Interest 0 0 0  Down, Depressed, Hopeless 0 0 0  PHQ - 2 Score 0 0 0  Altered sleeping 0 0 0  Tired, decreased energy 0 0 0  Change in appetite 0 0 0  Feeling bad or failure about yourself  0 0 0  Trouble concentrating 0 0 0  Moving slowly or fidgety/restless 0 0 0  Suicidal thoughts 0  0  PHQ-9 Score 0 0  0   Difficult doing work/chores Not difficult at all  Not difficult at all     Data saved with a previous flowsheet row definition       04/10/2024    2:04 PM 12/09/2023    2:15 PM 08/08/2023    1:13 PM  GAD 7 : Generalized Anxiety Score  Nervous, Anxious, on Edge 0 0 0  Control/stop worrying 0 0 0  Worry too much - different things 0 0 0  Trouble relaxing 0 0 0  Restless 0 0 0  Easily annoyed or irritable 0 0 0   Afraid - awful might happen 0 0 0  Total GAD 7 Score 0 0 0  Anxiety Difficulty Not difficult at all       ROS  Negative unless indicated in HPI   Objective:     BP (!) 148/91 (BP Location: Left Arm, Patient Position: Sitting, Cuff Size: Normal)   Pulse 66   Ht 5' 6 (1.676 m)   Wt 163 lb 14.4 oz (74.3 kg)   SpO2 100%   BMI 26.45 kg/m    Physical Exam Constitutional:      General: She is not in acute distress.    Appearance: Normal appearance. She is not ill-appearing, toxic-appearing or diaphoretic.  HENT:     Head: Normocephalic.  Eyes:     Extraocular Movements: Extraocular movements intact.     Conjunctiva/sclera: Conjunctivae normal.     Pupils: Pupils are equal, round, and reactive to light.  Cardiovascular:     Rate and Rhythm: Normal rate and regular rhythm.     Pulses: Normal pulses.     Heart sounds: Normal heart sounds. No murmur heard.    No friction rub. No gallop.  Pulmonary:     Effort: No  respiratory distress.     Breath sounds: No stridor. No wheezing, rhonchi or rales.  Chest:     Chest wall: No tenderness.  Musculoskeletal:        General: Signs of injury present.     Right lower leg: No edema.     Left lower leg: No edema.  Skin:    General: Skin is warm and dry.     Capillary Refill: Capillary refill takes less than 2 seconds.     Findings: Bruising and laceration present.     Comments: See image  Neurological:     General: No focal deficit present.     Mental Status: She is alert and oriented to person, place, and time. Mental status is at baseline.  Psychiatric:        Mood and Affect: Mood normal.        Behavior: Behavior normal.        Thought Content: Thought content normal.        Judgment: Judgment normal.      No results found for any visits on 04/10/24.    The 10-year ASCVD risk score (Arnett DK, et al., 2019) is: 29.6%    Assessment & Plan:  Visit for suture removal  Fall (on) (from) unspecified stairs and  steps, sequela  Eyebrow laceration, right, sequela     Assessment and Plan Assessment & Plan Fall, sequela - tripped over road speed bump while walking - R eye brow laceration, repaired in ED on 04/04/24 - head and neck CT - no acute findings - here today for removal - five sutures present along with surgical glue on top - pt had been applying surgical lube to area to keep moist per ED instructions - provider today removed five sutures, along with surgical glue.  - post removal applied triple antibiotic to area.  - monitor for any signs of infection - redness, swelling, drainage, pain, fevers, warm to area - laceration is healing well, closed up - Pt has no vision changes, EOMs intact - may continue to apply ice to R eye to decrease swelling  Healing right eyebrow laceration, status post suture removal The right eyebrow laceration is healing well post-suture removal. No vision changes or eye injury reported. The incision is closed, though there is some blood due to adhesive removal. Mild discomfort is present, but overall pain is manageable. Swelling is expected to persist. - May apply Vaseline to area to hid in healing      Return in about 4 weeks (around 05/08/2024) for follow for chronic disease mgmt in one month.    Curtis DELENA Boom, FNP

## 2024-04-28 ENCOUNTER — Ambulatory Visit: Admitting: Physician Assistant

## 2024-04-28 ENCOUNTER — Encounter: Payer: Self-pay | Admitting: Physician Assistant

## 2024-04-28 VITALS — BP 116/83 | HR 87 | Resp 16 | Ht 66.0 in | Wt 163.0 lb

## 2024-04-28 DIAGNOSIS — Z4802 Encounter for removal of sutures: Secondary | ICD-10-CM

## 2024-04-28 DIAGNOSIS — N1831 Chronic kidney disease, stage 3a: Secondary | ICD-10-CM | POA: Diagnosis not present

## 2024-04-28 DIAGNOSIS — R5383 Other fatigue: Secondary | ICD-10-CM | POA: Diagnosis not present

## 2024-04-28 DIAGNOSIS — K2100 Gastro-esophageal reflux disease with esophagitis, without bleeding: Secondary | ICD-10-CM | POA: Diagnosis not present

## 2024-04-28 DIAGNOSIS — R1030 Lower abdominal pain, unspecified: Secondary | ICD-10-CM

## 2024-04-28 DIAGNOSIS — E876 Hypokalemia: Secondary | ICD-10-CM | POA: Diagnosis not present

## 2024-04-28 DIAGNOSIS — I10 Essential (primary) hypertension: Secondary | ICD-10-CM | POA: Diagnosis not present

## 2024-04-28 DIAGNOSIS — N39498 Other specified urinary incontinence: Secondary | ICD-10-CM | POA: Diagnosis not present

## 2024-04-28 DIAGNOSIS — E782 Mixed hyperlipidemia: Secondary | ICD-10-CM

## 2024-04-28 DIAGNOSIS — R197 Diarrhea, unspecified: Secondary | ICD-10-CM | POA: Diagnosis not present

## 2024-04-28 DIAGNOSIS — S01111S Laceration without foreign body of right eyelid and periocular area, sequela: Secondary | ICD-10-CM

## 2024-04-28 DIAGNOSIS — Z789 Other specified health status: Secondary | ICD-10-CM

## 2024-04-28 DIAGNOSIS — R32 Unspecified urinary incontinence: Secondary | ICD-10-CM | POA: Insufficient documentation

## 2024-04-28 MED ORDER — POTASSIUM CHLORIDE ER 10 MEQ PO TBCR: 10.0000 meq | EXTENDED_RELEASE_TABLET | Freq: Every day | ORAL | 1 refills | Status: AC

## 2024-04-28 MED ORDER — OMEPRAZOLE 40 MG PO CPDR: 40.0000 mg | DELAYED_RELEASE_CAPSULE | Freq: Two times a day (BID) | ORAL | 1 refills | Status: AC

## 2024-04-28 NOTE — Progress Notes (Signed)
 Established patient visit  Patient: Jessica Little   DOB: 04-Feb-1946   78 y.o. Female  MRN: [id number] Visit Date: 04/28/2024  Today's healthcare provider: Jolynn Spencer, PA-C   Chief Complaint  Patient presents with   Transitions Of Care    Will see kidney dr in January   Medication Refill    Omeprazole  Potassium chloride    Abdominal Pain    Lower abdominal pain with some light headedness onset today   Subjective        Discussed the use of AI scribe software for clinical note transcription with the patient, who gave verbal consent to proceed.  History of Present Illness Jessica Little is a 78 year old female who presents with concerns about potassium levels and recent episodes of diarrhea.  She has been taking potassium supplements for about two years after two fainting episodes separated by four months. She has had no further fainting. Her potassium level in May was normal.  She has intermittent diarrhea, with the most recent episode last Sunday. She describes it as watery, at times severe enough to run down her legs while at church.  She has occasional fatigue and needs to sit down while working. She denies shortness of breath, chest pain, palpitations, or vision problems after a recent fall that required five stitches.  Her acid reflux is improving. She has no dysuria but notes occasional urinary urgency without pain.       04/10/2024    2:04 PM 12/09/2023    2:15 PM 11/05/2023    1:52 PM  Depression screen PHQ 2/9  Decreased Interest 0 0 0  Down, Depressed, Hopeless 0 0 0  PHQ - 2 Score 0 0 0  Altered sleeping 0 0 0  Tired, decreased energy 0 0 0  Change in appetite 0 0 0  Feeling bad or failure about yourself  0 0 0  Trouble concentrating 0 0 0  Moving slowly or fidgety/restless 0 0 0  Suicidal thoughts 0  0  PHQ-9 Score 0 0  0   Difficult doing work/chores Not difficult at all  Not difficult at all     Data saved with a previous flowsheet row definition       11 /14/2025    2:04 PM 12/09/2023    2:15 PM 08/08/2023    1:13 PM  GAD 7 : Generalized Anxiety Score  Nervous, Anxious, on Edge 0 0 0  Control/stop worrying 0 0 0  Worry too much - different things 0 0 0  Trouble relaxing 0 0 0  Restless 0 0 0  Easily annoyed or irritable 0 0 0  Afraid - awful might happen 0 0 0  Total GAD 7 Score 0 0 0  Anxiety Difficulty Not difficult at all      Medications: Outpatient Medications Prior to Visit  Medication Sig   acetaminophen  (TYLENOL ) 500 MG tablet Take 1 tablet (500 mg total) by mouth every 6 (six) hours as needed.   aspirin  EC 81 MG tablet Take 81 mg by mouth daily. Swallow whole.   atenolol  (TENORMIN ) 50 MG tablet TAKE 1 TABLET BY MOUTH ONCE DAILY . APPOINTMENT REQUIRED FOR FUTURE REFILLS   atorvastatin  (LIPITOR) 40 MG tablet Take 1 tablet (40 mg total) by mouth daily.   B Complex-C (B-COMPLEX WITH VITAMIN C) tablet Take 1 tablet by mouth daily.   Biotin 89999 MCG TABS Take by mouth.   Calcium -Magnesium-Zinc 500-250-12.5 MG TABS Take 1 tablet by mouth daily. Dose unknown  Cholecalciferol (VITAMIN D3 PO) Take by mouth daily.   erythromycin  ophthalmic ointment Place 1 Application into the left eye at bedtime. 1 cm thin ribbon   Flaxseed, Linseed, (FLAXSEED OIL) 1000 MG CAPS Take by mouth.   lisinopril  (ZESTRIL ) 20 MG tablet Take 2 tablets by mouth once daily   nortriptyline  (PAMELOR ) 50 MG capsule Take 1 capsule (50 mg total) by mouth at bedtime.   perphenazine  (TRILAFON ) 4 MG tablet Take 1 tablet (4 mg total) by mouth at bedtime.   vitamin E 1000 UNIT capsule Take 1,000 Units by mouth daily.   [DISCONTINUED] omeprazole  (PRILOSEC) 40 MG capsule Take 1 capsule by mouth twice daily   [DISCONTINUED] potassium chloride  (KLOR-CON ) 10 MEQ tablet Take 1 tablet (10 mEq total) by mouth daily.   No facility-administered medications prior to visit.    Review of Systems All negative Except see HPI       Objective    BP 116/83   Pulse  87   Resp 16   Ht 5' 6 (1.676 m)   Wt 163 lb (73.9 kg)   SpO2 98%   BMI 26.31 kg/m     Physical Exam Vitals reviewed.  Constitutional:      General: She is not in acute distress.    Appearance: Normal appearance. She is well-developed. She is not ill-appearing, toxic-appearing or diaphoretic.  HENT:     Head: Normocephalic and atraumatic.     Right Ear: Tympanic membrane, ear canal and external ear normal.     Left Ear: Tympanic membrane, ear canal and external ear normal.     Nose: Nose normal. No congestion or rhinorrhea.     Mouth/Throat:     Mouth: Mucous membranes are moist.     Pharynx: Oropharynx is clear. No oropharyngeal exudate.  Eyes:     General: No scleral icterus.       Right eye: No discharge.        Left eye: No discharge.     Conjunctiva/sclera: Conjunctivae normal.     Pupils: Pupils are equal, round, and reactive to light.  Neck:     Thyroid : No thyromegaly.     Vascular: No carotid bruit.  Cardiovascular:     Rate and Rhythm: Normal rate and regular rhythm.     Pulses: Normal pulses.     Heart sounds: Normal heart sounds. No murmur heard.    No friction rub. No gallop.  Pulmonary:     Effort: Pulmonary effort is normal. No respiratory distress.     Breath sounds: Normal breath sounds. No wheezing, rhonchi or rales.  Abdominal:     General: Abdomen is flat. Bowel sounds are normal. There is no distension.     Palpations: Abdomen is soft. There is no mass.     Tenderness: There is no abdominal tenderness. There is no right CVA tenderness, left CVA tenderness, guarding or rebound.     Hernia: No hernia is present.  Musculoskeletal:        General: No swelling, tenderness, deformity or signs of injury. Normal range of motion.     Cervical back: Normal range of motion and neck supple. No rigidity or tenderness.     Right lower leg: No edema.     Left lower leg: No edema.  Lymphadenopathy:     Cervical: No cervical adenopathy.  Skin:    General:  Skin is warm and dry.     Coloration: Skin is not jaundiced or pale.     Findings:  No bruising, erythema, lesion or rash.  Neurological:     Mental Status: She is alert and oriented to person, place, and time. Mental status is at baseline.     Gait: Gait normal.  Psychiatric:        Mood and Affect: Mood normal.        Behavior: Behavior normal.        Thought Content: Thought content normal.        Judgment: Judgment normal.      No results found for any visits on 04/28/24.     Transition of care from [facility] Assessment & Plan Chronic kidney disease, stage 3a Managed by nephrologist with stable potassium levels. - Continue management with nephrologist. - Ordered urinalysis to monitor kidney function.  Acute diarrhea Recent episode likely due to dietary indiscretion and could be due to viral gastroenteritis. No blood in stool - Advised increased fluid intake with electrolytes/oral rehydration solutions, the BRAT diet, and avoiding dairy products temporarily. - Recommended soft foods like chicken soup and crackers. - Ordered basic blood work. Will follow-up or rtc if symptoms persist  Gastroesophageal reflux disease with esophagitis Chronic Symptoms improving. - Continue omeprazole  as prescribed.  Hypokalemia Chronic Stable potassium levels with supplementation. - Continue daily potassium supplementation. - Ordered blood work to check potassium levels.  Other specified urinary incontinence Intermittent urgency without pain or increased frequency. Discussed pelvic floor exercises. - Consider pelvic floor exercises or alternative exercises.  Fatigue Intermittent, likely age-related. - Monitor symptoms and manage conservatively.  General Health Maintenance Routine health maintenance discussed. - Ordered blood work including cholesterol and A1c levels. - Scheduled follow-up appointment in January after nephrology visit.   Primary hypertension Chronic and  stable Hx of carotid stenosis Hx of orthostatic hypotension and syncope Continue lisinopril  40, atenolol  50 Continue lifestyle modifications Will follow-up   Mixed hyperlipidemia Chronic and previously stable Continue atorvastatin  40 Check lp Will follow-up   Gastroesophageal reflux disease with esophagitis without hemorrhage - omeprazole  (PRILOSEC) 40 MG capsule; Take 1 capsule (40 mg total) by mouth 2 (two) times daily.  Dispense: 180 capsule; Refill: 1  Hypokalemia - CMP - potassium chloride  (KLOR-CON ) 10 MEQ tablet; Take 1 tablet (10 mEq total) by mouth daily.  Dispense: 90 tablet; Refill: 1  Other fatigue  - Comprehensive metabolic panel with GFR - CBC with Differential/Platelet - Hemoglobin A1c - Lipid panel - TSH  Lower abdominal pain Other urinary incontinence - Urinalysis, Routine w reflex microscopic   Orders Placed This Encounter  Procedures   Comprehensive metabolic panel with GFR    Has the patient fasted?:   Yes   CBC with Differential/Platelet   Hemoglobin A1c   Lipid panel    Has the patient fasted?:   Yes   TSH   Urinalysis, Routine w reflex microscopic    Return in about 2 weeks (around 05/12/2024) for chronic disease f/u after 05/12/24.   The patient was advised to call back or seek an in-person evaluation if the symptoms worsen or if the condition fails to improve as anticipated.  I discussed the assessment and treatment plan with the patient. The patient was provided an opportunity to ask questions and all were answered. The patient agreed with the plan and demonstrated an understanding of the instructions.  I, Tristain Daily, PA-C have reviewed all documentation for this visit. The documentation on 04/28/2024  for the exam, diagnosis, procedures, and orders are all accurate and complete.  Jolynn Spencer, [facility], MMS [facility] [phone] (  phone) [phone] (fax)  [facility]

## 2024-05-04 DIAGNOSIS — R5383 Other fatigue: Secondary | ICD-10-CM | POA: Diagnosis not present

## 2024-05-05 ENCOUNTER — Ambulatory Visit: Payer: Self-pay | Admitting: Physician Assistant

## 2024-05-05 LAB — CBC WITH DIFFERENTIAL/PLATELET
Basophils Absolute: 0.1 x10E3/uL (ref 0.0–0.2)
Basos: 1 %
EOS (ABSOLUTE): 0.2 x10E3/uL (ref 0.0–0.4)
Eos: 3 %
Hematocrit: 40.4 % (ref 34.0–46.6)
Hemoglobin: 13 g/dL (ref 11.1–15.9)
Immature Grans (Abs): 0 x10E3/uL (ref 0.0–0.1)
Immature Granulocytes: 0 %
Lymphocytes Absolute: 2.8 x10E3/uL (ref 0.7–3.1)
Lymphs: 42 %
MCH: 26.9 pg (ref 26.6–33.0)
MCHC: 32.2 g/dL (ref 31.5–35.7)
MCV: 84 fL (ref 79–97)
Monocytes Absolute: 0.7 x10E3/uL (ref 0.1–0.9)
Monocytes: 10 %
Neutrophils Absolute: 2.8 x10E3/uL (ref 1.4–7.0)
Neutrophils: 44 %
Platelets: 226 x10E3/uL (ref 150–450)
RBC: 4.84 x10E6/uL (ref 3.77–5.28)
RDW: 14.2 % (ref 11.7–15.4)
WBC: 6.5 x10E3/uL (ref 3.4–10.8)

## 2024-05-05 LAB — COMPREHENSIVE METABOLIC PANEL WITH GFR
ALT: 18 IU/L (ref 0–32)
AST: 25 IU/L (ref 0–40)
Albumin: 4.4 g/dL (ref 3.8–4.8)
Alkaline Phosphatase: 101 IU/L (ref 49–135)
BUN/Creatinine Ratio: 12 (ref 12–28)
BUN: 15 mg/dL (ref 8–27)
Bilirubin Total: 0.5 mg/dL (ref 0.0–1.2)
CO2: 26 mmol/L (ref 20–29)
Calcium: 9 mg/dL (ref 8.7–10.3)
Chloride: 100 mmol/L (ref 96–106)
Creatinine, Ser: 1.21 mg/dL — ABNORMAL HIGH (ref 0.57–1.00)
Globulin, Total: 2.4 g/dL (ref 1.5–4.5)
Glucose: 99 mg/dL (ref 70–99)
Potassium: 3.7 mmol/L (ref 3.5–5.2)
Sodium: 141 mmol/L (ref 134–144)
Total Protein: 6.8 g/dL (ref 6.0–8.5)
eGFR: 46 mL/min/1.73 — ABNORMAL LOW (ref >59)

## 2024-05-05 LAB — LIPID PANEL
Chol/HDL Ratio: 2.5 ratio (ref 0.0–4.4)
Cholesterol, Total: 165 mg/dL (ref 100–199)
HDL: 66 mg/dL (ref >39)
LDL Chol Calc (NIH): 77 mg/dL (ref 0–99)
Triglycerides: 127 mg/dL (ref 0–149)
VLDL Cholesterol Cal: 22 mg/dL (ref 5–40)

## 2024-05-05 LAB — URINALYSIS, ROUTINE W REFLEX MICROSCOPIC

## 2024-05-05 LAB — SPECIMEN STATUS REPORT

## 2024-05-05 LAB — TSH: TSH: 2.48 u[IU]/mL (ref 0.450–4.500)

## 2024-05-05 LAB — HEMOGLOBIN A1C
Est. average glucose Bld gHb Est-mCnc: 108 mg/dL
Hgb A1c MFr Bld: 5.4 % (ref 4.8–5.6)

## 2024-05-06 NOTE — Progress Notes (Signed)
 All labs within the normal limit except decreased kidney function Advised to drink plenty of water up to 10 glasses of water daily, avoid taking NSAIDs/ibuprofen, Aleve.

## 2024-05-11 ENCOUNTER — Ambulatory Visit: Admitting: Family Medicine

## 2024-06-12 NOTE — Progress Notes (Signed)
 Jessica Little                                          MRN: [id number]   06/12/2024   The [facility] Quality Team Specialist reviewed this patient medical record for the purposes of chart review for care gap closure. The following were reviewed: abstraction for care gap closure-controlling blood pressure.    [facility] Quality Team

## 2024-06-17 ENCOUNTER — Ambulatory Visit: Admitting: Physician Assistant

## 2024-06-17 ENCOUNTER — Encounter: Payer: Self-pay | Admitting: Physician Assistant

## 2024-06-17 VITALS — BP 107/69 | HR 85 | Wt 154.8 lb

## 2024-06-17 DIAGNOSIS — E782 Mixed hyperlipidemia: Secondary | ICD-10-CM | POA: Diagnosis not present

## 2024-06-17 DIAGNOSIS — N1831 Chronic kidney disease, stage 3a: Secondary | ICD-10-CM | POA: Diagnosis not present

## 2024-06-17 DIAGNOSIS — Z0000 Encounter for general adult medical examination without abnormal findings: Secondary | ICD-10-CM

## 2024-06-17 DIAGNOSIS — Z5941 Food insecurity: Secondary | ICD-10-CM

## 2024-06-17 DIAGNOSIS — K2100 Gastro-esophageal reflux disease with esophagitis, without bleeding: Secondary | ICD-10-CM

## 2024-06-17 DIAGNOSIS — Z13820 Encounter for screening for osteoporosis: Secondary | ICD-10-CM

## 2024-06-17 DIAGNOSIS — N39498 Other specified urinary incontinence: Secondary | ICD-10-CM

## 2024-06-17 DIAGNOSIS — I10 Essential (primary) hypertension: Secondary | ICD-10-CM

## 2024-06-17 NOTE — Progress Notes (Unsigned)
 "    Complete physical exam  Patient: Jessica Little   DOB: September 10, 1945   79 y.o. Female  MRN: [id number] Visit Date: 06/17/2024  Today's healthcare provider: Jolynn Spencer, PA-C   Chief Complaint  Patient presents with   CPE   Subjective    Jessica Little is a 79 y.o. female who presents today for a complete physical exam.  She reports consuming a general diet.  Discussed the use of AI scribe software for clinical note transcription with the patient, who gave verbal consent to proceed.  History of Present Illness Jessica Little is a 79 year old female with hypertension and chronic kidney disease who presents for a CPE  She takes lisinopril  for blood pressure control and kidney protection and sometimes feels lightheaded when her blood pressure is low.  She takes potassium supplements following a prior fainting episode and also takes a cholesterol medication.  She sometimes has urgency but no incontinence. Her diet is limited at times by finances, which affects her ability to choose soft foods.  She had hiatal hernia repair in 2019 and still has acid reflux.  She has past fainting episodes, including one that prompted evaluation with a normal EEG, and has an inactive cardiac monitor under the skin.  She has carotid stenosis monitored annually, with less than 50% stenosis at her last check.  She has lifelong intermittent constipation that can alternate with diarrhea.  She lives alone, is independent in daily activities, walks in the mall for exercise, and bases her diet on available food. She sleeps well and feels her memory and overall well-being are good.    Last depression screening scores    04/10/2024    2:04 PM 12/09/2023    2:15 PM 11/05/2023    1:52 PM  PHQ 2/9 Scores  PHQ - 2 Score 0 0 0  PHQ- 9 Score 0 0  0      Data saved with a previous flowsheet row definition   Last fall risk screening    04/10/2024    2:04 PM  Fall Risk   Falls in the past year? 1   Number falls in past yr: 0  Injury with Fall? 1      Data saved with a previous flowsheet row definition   Last Audit-C alcohol use screening    11/05/2023    1:52 PM  Alcohol Use Disorder Test (AUDIT)  1. How often do you have a drink containing alcohol? 0  2. How many drinks containing alcohol do you have on a typical day when you are drinking? 0  3. How often do you have six or more drinks on one occasion? 0  AUDIT-C Score 0   A score of 3 or more in women, and 4 or more in men indicates increased risk for alcohol abuse, EXCEPT if all of the points are from question 1   Past Medical History:  Diagnosis Date   Anxiety    GERD (gastroesophageal reflux disease)    Hyperlipidemia    Hypertension    Past Surgical History:  Procedure Laterality Date   CATARACT EXTRACTION W/PHACO Left 01/18/2021   Procedure: CATARACT EXTRACTION PHACO AND INTRAOCULAR LENS PLACEMENT (IOC) LEFT 7.40 01:11.7;  Surgeon: Mittie Gaskin, MD;  [facility];  Service: Ophthalmology;  Laterality: Left;   CATARACT EXTRACTION W/PHACO Right 02/01/2021   Procedure: CATARACT EXTRACTION PHACO AND INTRAOCULAR LENS PLACEMENT (IOC) RIGHT EYHANCE TORIC 8.60 01:09.9 ;  Surgeon: Mittie Gaskin, MD;  Location:  [facility];  Service: Ophthalmology;  Laterality: Right;   CHOLECYSTECTOMY     COLONOSCOPY  01/01/2006   COLONOSCOPY WITH PROPOFOL  N/A 08/23/2015   Procedure: COLONOSCOPY WITH PROPOFOL ;  Surgeon: Reyes LELON Cota, MD;  Location: [facility];  Service: Endoscopy;  Laterality: N/A;   heart monitor Right 10/22/2021   HIATAL HERNIA REPAIR  2019   Social History   Socioeconomic History   Marital status: Widowed    Spouse name: Not on file   Number of children: 0   Years of education: College   Highest education level: Bachelor's degree (e.g., BA, AB, BS)  Occupational History   Occupation: Retired  Tobacco Use   Smoking status: Never   Smokeless tobacco: Never   Vaping Use   Vaping status: Never Used  Substance and Sexual Activity   Alcohol use: No   Drug use: No   Sexual activity: Not on file  Other Topics Concern   Not on file  Social History Narrative   Right handed   No caffeine   One story home   Social Drivers of Health   Tobacco Use: Low Risk (06/17/2024)   Patient History    Smoking Tobacco Use: Never    Smokeless Tobacco Use: Never    Passive Exposure: Not on file  Financial Resource Strain: Low Risk (11/05/2023)   Overall Financial Resource Strain (CARDIA)    Difficulty of Paying Living Expenses: Not very hard  Food Insecurity: No Food Insecurity (11/05/2023)   Hunger Vital Sign    Worried About Running Out of Food in the Last Year: Never true    Ran Out of Food in the Last Year: Never true  Transportation Needs: No Transportation Needs (11/05/2023)   PRAPARE - Administrator, Civil Service (Medical): No    Lack of Transportation (Non-Medical): No  Physical Activity: Insufficiently Active (11/05/2023)   Exercise Vital Sign    Days of Exercise per Week: 3 days    Minutes of Exercise per Session: 30 min  Stress: No Stress Concern Present (11/05/2023)   [employer] of Occupational Health - Occupational Stress Questionnaire    Feeling of Stress : Only a little  Social Connections: Moderately Isolated (11/05/2023)   Social Connection and Isolation Panel    Frequency of Communication with Friends and Family: More than three times a week    Frequency of Social Gatherings with Friends and Family: Twice a week    Attends Religious Services: More than 4 times per year    Active Member of [employer] or Organizations: No    Attends Banker Meetings: Never    Marital Status: Widowed  Intimate Partner Violence: Not At Risk (11/05/2023)   Humiliation, Afraid, Rape, and Kick questionnaire    Fear of Current or Ex-Partner: No    Emotionally Abused: No    Physically Abused: No    Sexually Abused: No  Depression  (PHQ2-9): Low Risk (04/10/2024)   Depression (PHQ2-9)    PHQ-2 Score: 0  Alcohol Screen: Low Risk (11/05/2023)   Alcohol Screen    Last Alcohol Screening Score (AUDIT): 0  Housing: Unknown (11/05/2023)   Housing Stability Vital Sign    Unable to Pay for Housing in the Last Year: No    Number of Times Moved in the Last Year: Not on file    Homeless in the Last Year: No  Utilities: Not At Risk (11/05/2023)   [employer] Utilities    Threatened with loss of utilities: No  Health  Literacy: Adequate Health Literacy (11/05/2023)   B1300 Health Literacy    Frequency of need for help with medical instructions: Never   Family Status  Relation Name Status   Mother  Deceased at age 1   Father  Deceased at age 58       Heart Attack   Sister  Alive   Sister  Alive   Brother  Alive   Neg Hx  (Not Specified)  No partnership data on file   Family History  Problem Relation Age of Onset   Arrhythmia Mother    Heart attack Father    Stroke Father    Depression Sister    Hypertension Sister    Healthy Brother    Breast cancer Neg Hx    Allergies[1]  Patient Care Team: Djuana Littleton, PA-C as PCP - General (Physician Assistant) Cindie Ole DASEN, MD (Inactive) as PCP - Electrophysiology (Cardiology) Darliss Rogue, MD as PCP - Cardiology (Cardiology) Cottle, Lorene KANDICE Raddle., MD as Attending Physician (Psychiatry) Pa, [facility] (Optometry)   Medications: Show/hide medication list[2]  Review of Systems  All other systems reviewed and are negative.  Except see HPI     Objective    BP 107/69 (BP Location: Right Arm, Patient Position: Sitting, Cuff Size: Normal)   Pulse 85   Wt 154 lb 12.8 oz (70.2 kg)   SpO2 99%   BMI 24.99 kg/m      Physical Exam Vitals reviewed.  Constitutional:      General: She is not in acute distress.    Appearance: Normal appearance. She is well-developed. She is not ill-appearing, toxic-appearing or diaphoretic.  HENT:     Head: Normocephalic  and atraumatic.     Right Ear: Tympanic membrane, ear canal and external ear normal.     Left Ear: Tympanic membrane, ear canal and external ear normal.     Nose: Nose normal. No congestion or rhinorrhea.     Mouth/Throat:     Mouth: Mucous membranes are moist.     Pharynx: Oropharynx is clear. No oropharyngeal exudate.  Eyes:     General: No scleral icterus.       Right eye: No discharge.        Left eye: No discharge.     Conjunctiva/sclera: Conjunctivae normal.     Pupils: Pupils are equal, round, and reactive to light.  Neck:     Thyroid : No thyromegaly.     Vascular: No carotid bruit.  Cardiovascular:     Rate and Rhythm: Normal rate and regular rhythm.     Pulses: Normal pulses.     Heart sounds: Normal heart sounds. No murmur heard.    No friction rub. No gallop.  Pulmonary:     Effort: Pulmonary effort is normal. No respiratory distress.     Breath sounds: Normal breath sounds. No wheezing or rales.  Abdominal:     General: Abdomen is flat. Bowel sounds are normal. There is no distension.     Palpations: Abdomen is soft. There is no mass.     Tenderness: There is no abdominal tenderness. There is no right CVA tenderness, left CVA tenderness, guarding or rebound.     Hernia: No hernia is present.  Musculoskeletal:        General: No swelling, tenderness, deformity or signs of injury. Normal range of motion.     Cervical back: Normal range of motion and neck supple. No rigidity or tenderness.     Right lower leg: No  edema.     Left lower leg: No edema.  Lymphadenopathy:     Cervical: No cervical adenopathy.  Skin:    General: Skin is warm and dry.     Coloration: Skin is not jaundiced or pale.     Findings: No bruising, erythema, lesion or rash.  Neurological:     Mental Status: She is alert and oriented to person, place, and time. Mental status is at baseline.     Gait: Gait normal.  Psychiatric:        Mood and Affect: Mood normal.        Behavior: Behavior  normal.        Thought Content: Thought content normal.        Judgment: Judgment normal.      No results found for any visits on 06/17/24.  Assessment & Plan    Routine Health Maintenance and Physical Exam  Exercise Activities and Dietary recommendations  Goals      Cut out extra servings     DIET - EAT MORE FRUITS AND VEGETABLES     DIET - INCREASE WATER INTAKE     Recommend increasing water intake to 6-8 8 oz glasses a day.         Immunization History  Administered Date(s) Administered   Hepatitis A 08/13/2001   Moderna Sars-Covid-2 Vaccination 10/14/2019, 11/11/2019   Pneumococcal Conjugate-13 07/06/2015   Pneumococcal Polysaccharide-23 10/05/2016   Td 10/24/2000   Tdap 04/04/2024    Health Maintenance  Topic Date Due   Osteoporosis screening with Bone Density Scan  01/02/2022   COVID-19 Vaccine (3 - 2025-26 season) 07/03/2024*   Zoster (Shingles) Vaccine (1 of 2) 09/15/2024*   Medicare Annual Wellness Visit  11/04/2024   DTaP/Tdap/Td vaccine (3 - Td or Tdap) 04/04/2034   Pneumococcal Vaccine for age over 44  Completed   Hepatitis C Screening  Completed   Meningitis B Vaccine  Aged Out   Breast Cancer Screening  Discontinued   Colon Cancer Screening  Discontinued  *Topic was postponed. The date shown is not the original due date.    Discussed health benefits of physical activity, and encouraged her to engage in regular exercise appropriate for her age and condition.  Assessment & Plan Screening for osteoporosis Osteoporosis screening is due. - Schedule bone density test for osteoporosis screening.  Food insecurity Reports financial difficulties affecting her ability to eat soft foods, leading to occasional diarrhea. - Provided information on food pantry resources.  Chronic kidney disease stage 3 GFR in the 40s, indicating well-managed kidney function. Advised to avoid NSAIDs and maintain hydration. - Continue lisinopril  for kidney protection. -  Encouraged adequate hydration. - Advised to avoid NSAIDs. Follow-up with nephrology  Hypertension Chronic and stable Blood pressure is well-managed with lisinopril . Advised to monitor blood pressure regularly, especially before and after medication intake. - Continue lisinopril  for blood pressure management. - Monitor blood pressure regularly, at least five times a week.  Hyperlipidemia Chronic and unstable Continues on cholesterol medication. - Continue current cholesterol medication.  Gastroesophageal reflux disease Chronic Stable Managed with omeprazole  as needed. Advised on lifestyle modifications to avoid exacerbating factors such as acidic foods, caffeine, and spicy foods. - Continue omeprazole  as needed. - Advised on lifestyle modifications to avoid exacerbating factors.  Functional bowel symptoms Intermittent diarrhea and constipation. Previous bowel specimen showed no abnormalities. - Continue current management and monitor symptoms.  Urinary incontinence Mild urinary incontinence, not severe. - Continue to monitor symptoms and will consider medication if  symptoms worsen.  Annual physical exam Uptodate with eye exam Things to do to keep yourself healthy  - Exercise at least 30-45 minutes a day, 3-4 days a week.  - Eat a low-fat diet with lots of fruits and vegetables, up to 7-9 servings per day.  - Seatbelts can save your life. Wear them always.  - Smoke detectors on every level of your home, check batteries every year.  - Eye Doctor - have an eye exam every 1-2 years  - Safe sex - if you may be exposed to STDs, use a condom.  - Alcohol -  If you drink, do it moderately, less than 2 drinks per day.  - Health Care Power of Attorney. Choose someone to speak for you if you are not able.  - Depression is common in our stressful world.If you're feeling down or losing interest in things you normally enjoy, please come in for a visit.  - Violence - If anyone is threatening  or hurting you, please call immediately.  Screening for osteoporosis   - DG Bone Density; Future  No follow-ups on file.    The patient was advised to call back or seek an in-person evaluation if the symptoms worsen or if the condition fails to improve as anticipated.  I discussed the assessment and treatment plan with the patient. The patient was provided an opportunity to ask questions and all were answered. The patient agreed with the plan and demonstrated an understanding of the instructions.  I, Joselle Deeds, PA-C have reviewed all documentation for this visit. The documentation on 06/17/2024  for the exam, diagnosis, procedures, and orders are all accurate and complete.  Jolynn Spencer, [facility], MMS [facility] [phone] (phone) [phone] (fax)  [facility]     [1] No Known Allergies [2]  Outpatient Medications Prior to Visit  Medication Sig   acetaminophen  (TYLENOL ) 500 MG tablet Take 1 tablet (500 mg total) by mouth every 6 (six) hours as needed.   aspirin  EC 81 MG tablet Take 81 mg by mouth daily. Swallow whole.   atenolol  (TENORMIN ) 50 MG tablet TAKE 1 TABLET BY MOUTH ONCE DAILY . APPOINTMENT REQUIRED FOR FUTURE REFILLS   atorvastatin  (LIPITOR) 40 MG tablet Take 1 tablet (40 mg total) by mouth daily.   B Complex-C (B-COMPLEX WITH VITAMIN C) tablet Take 1 tablet by mouth daily.   Biotin 89999 MCG TABS Take by mouth.   Calcium -Magnesium-Zinc 500-250-12.5 MG TABS Take 1 tablet by mouth daily. Dose unknown   Cholecalciferol (VITAMIN D3 PO) Take by mouth daily.   erythromycin  ophthalmic ointment Place 1 Application into the left eye at bedtime. 1 cm thin ribbon   Flaxseed, Linseed, (FLAXSEED OIL) 1000 MG CAPS Take by mouth.   lisinopril  (ZESTRIL ) 20 MG tablet Take 2 tablets by mouth once daily   nortriptyline  (PAMELOR ) 50 MG capsule Take 1 capsule (50 mg total) by mouth at bedtime.   omeprazole  (PRILOSEC) 40 MG capsule Take 1 capsule (40 mg  total) by mouth 2 (two) times daily.   perphenazine  (TRILAFON ) 4 MG tablet Take 1 tablet (4 mg total) by mouth at bedtime.   potassium chloride  (KLOR-CON ) 10 MEQ tablet Take 1 tablet (10 mEq total) by mouth daily.   vitamin E 1000 UNIT capsule Take 1,000 Units by mouth daily.   No facility-administered medications prior to visit.   "

## 2024-09-24 ENCOUNTER — Ambulatory Visit: Admitting: Physician Assistant

## 2024-10-15 ENCOUNTER — Ambulatory Visit: Admitting: Physician Assistant

## 2024-10-21 ENCOUNTER — Encounter (INDEPENDENT_AMBULATORY_CARE_PROVIDER_SITE_OTHER)

## 2024-10-21 ENCOUNTER — Ambulatory Visit (INDEPENDENT_AMBULATORY_CARE_PROVIDER_SITE_OTHER): Admitting: Nurse Practitioner

## 2024-11-17 ENCOUNTER — Ambulatory Visit

## 2025-02-25 ENCOUNTER — Ambulatory Visit: Admitting: Psychiatry

## 2025-06-18 ENCOUNTER — Encounter: Admitting: Physician Assistant
